# Patient Record
Sex: Male | Born: 1955
Health system: Southern US, Community
[De-identification: ages and names within clinical notes are randomized; demographics above are authoritative.]

## PROBLEM LIST (undated history)

## (undated) DIAGNOSIS — Z87442 Personal history of urinary calculi: Secondary | ICD-10-CM

## (undated) DIAGNOSIS — G629 Polyneuropathy, unspecified: Secondary | ICD-10-CM

## (undated) DIAGNOSIS — E1165 Type 2 diabetes mellitus with hyperglycemia: Secondary | ICD-10-CM

## (undated) DIAGNOSIS — Z950 Presence of cardiac pacemaker: Secondary | ICD-10-CM

## (undated) DIAGNOSIS — Z79631 Long term (current) use of antimetabolite agent: Secondary | ICD-10-CM

## (undated) DIAGNOSIS — IMO0002 Reserved for concepts with insufficient information to code with codable children: Secondary | ICD-10-CM

## (undated) DIAGNOSIS — Z79899 Other long term (current) drug therapy: Secondary | ICD-10-CM

## (undated) DIAGNOSIS — K219 Gastro-esophageal reflux disease without esophagitis: Secondary | ICD-10-CM

## (undated) DIAGNOSIS — I480 Paroxysmal atrial fibrillation: Secondary | ICD-10-CM

## (undated) DIAGNOSIS — R55 Syncope and collapse: Secondary | ICD-10-CM

## (undated) DIAGNOSIS — I10 Essential (primary) hypertension: Secondary | ICD-10-CM

## (undated) DIAGNOSIS — M069 Rheumatoid arthritis, unspecified: Secondary | ICD-10-CM

## (undated) DIAGNOSIS — Z7901 Long term (current) use of anticoagulants: Secondary | ICD-10-CM

## (undated) DIAGNOSIS — I4510 Unspecified right bundle-branch block: Secondary | ICD-10-CM

## (undated) DIAGNOSIS — I2510 Atherosclerotic heart disease of native coronary artery without angina pectoris: Secondary | ICD-10-CM

## (undated) DIAGNOSIS — E785 Hyperlipidemia, unspecified: Secondary | ICD-10-CM

## (undated) DIAGNOSIS — N200 Calculus of kidney: Secondary | ICD-10-CM

## (undated) DIAGNOSIS — I219 Acute myocardial infarction, unspecified: Secondary | ICD-10-CM

## (undated) DIAGNOSIS — N3281 Overactive bladder: Secondary | ICD-10-CM

## (undated) DIAGNOSIS — I442 Atrioventricular block, complete: Secondary | ICD-10-CM

## (undated) DIAGNOSIS — I700 Atherosclerosis of aorta: Secondary | ICD-10-CM

## (undated) HISTORY — PX: TONSILLECTOMY: SUR1361

## (undated) HISTORY — DX: Rheumatoid arthritis, unspecified: M06.9

## (undated) HISTORY — DX: Syncope and collapse: R55

## (undated) HISTORY — PX: PROSTATE SURGERY: SHX751

---

## 2005-07-07 ENCOUNTER — Ambulatory Visit: Payer: Self-pay | Admitting: Internal Medicine

## 2005-07-07 IMAGING — US US RENAL KIDNEY
1 series · 17 of 25 positions shown · non-contrast
Comparison: none

REASON FOR EXAM: Renal insufficiency
COMMENTS:

[Series 1: us renal kidney · 17 of 39 slices shown]
[im 1/39]
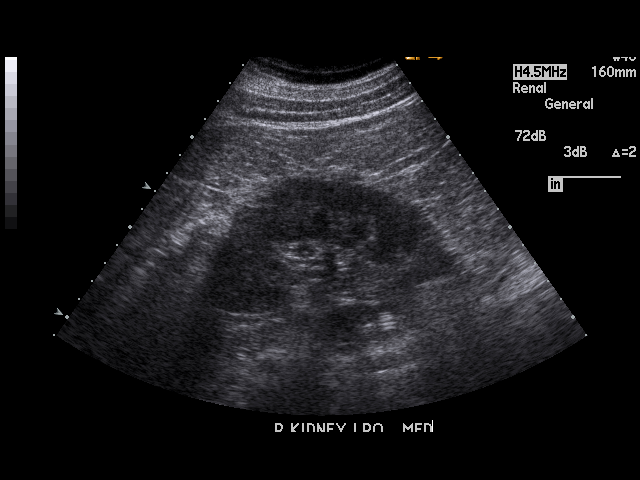
[im 4/39]
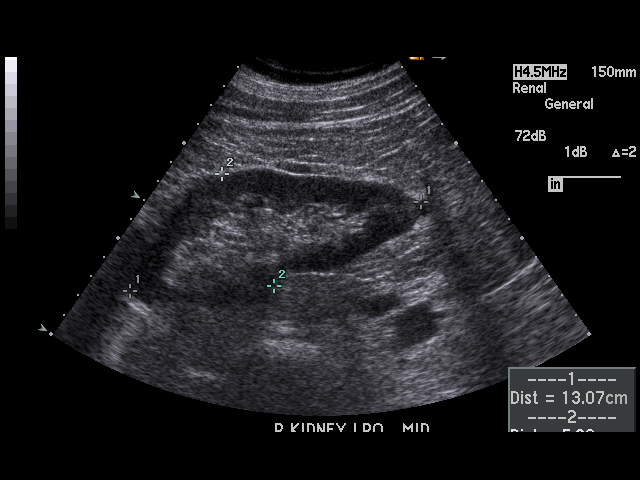
[im 5/39]
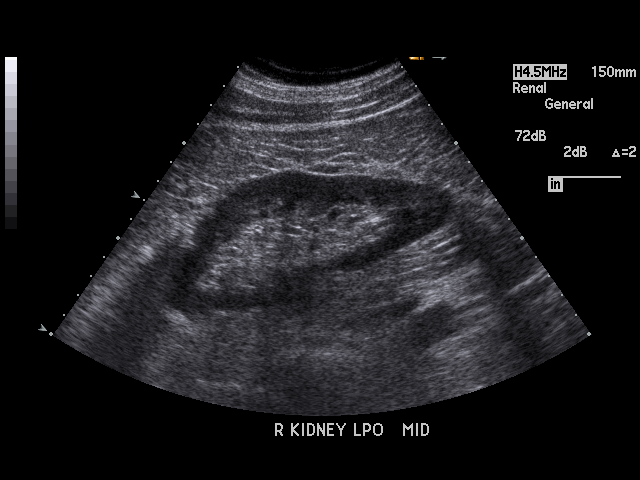
[im 8/39]
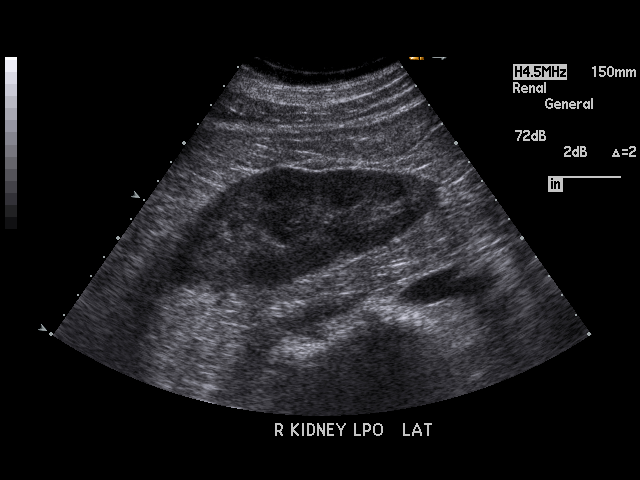
[im 10/39]
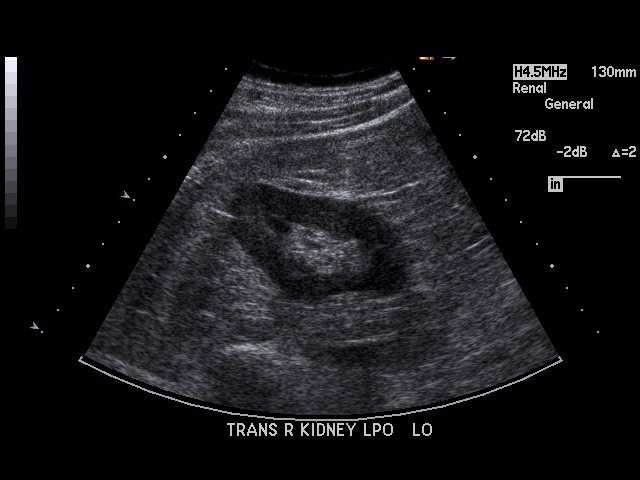
[im 13/39]
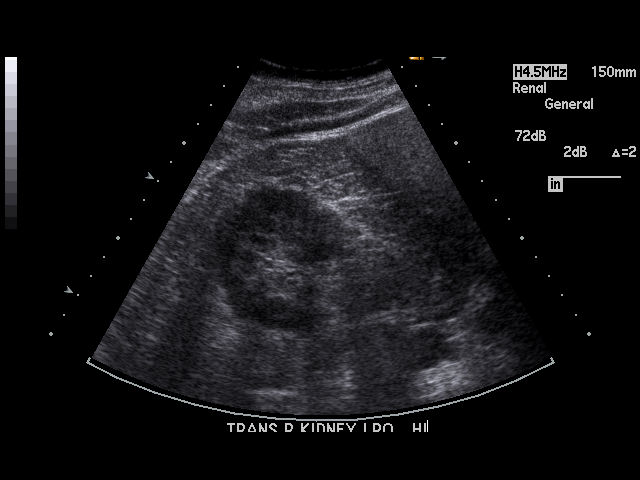
[im 15/39]
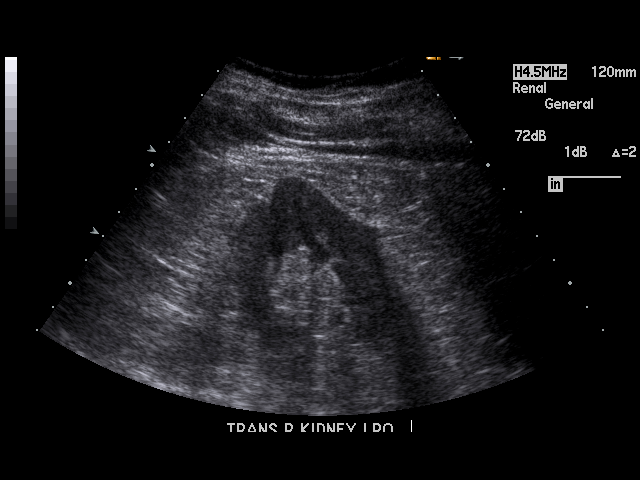
[im 18/39]
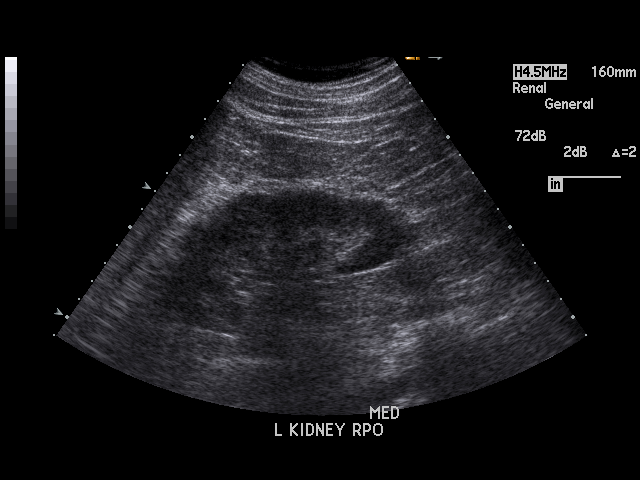
[im 20/39]
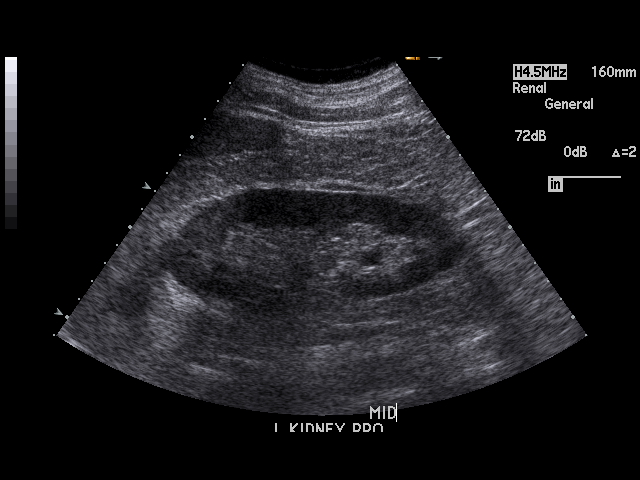
[im 21/39]
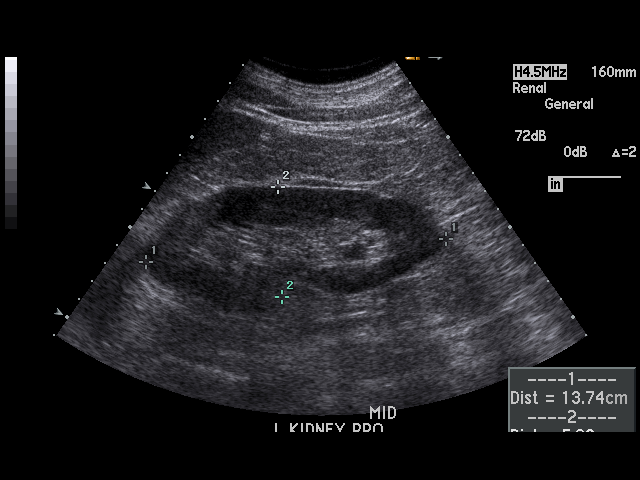
[im 24/39]
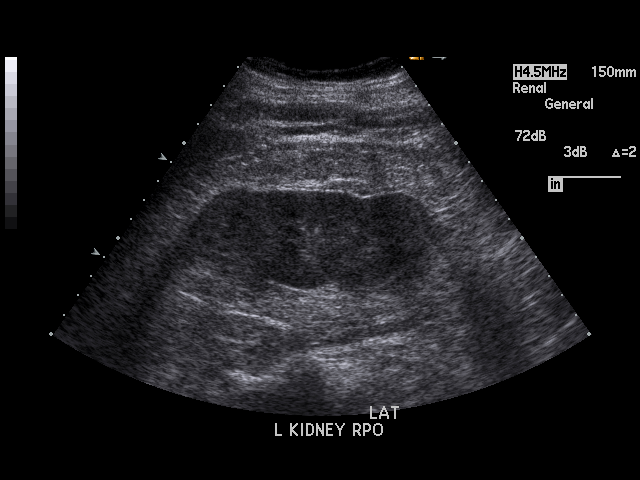
[im 26/39]
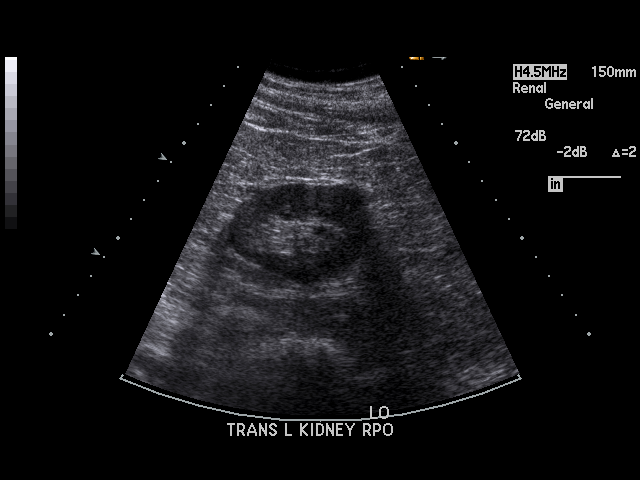
[im 29/39]
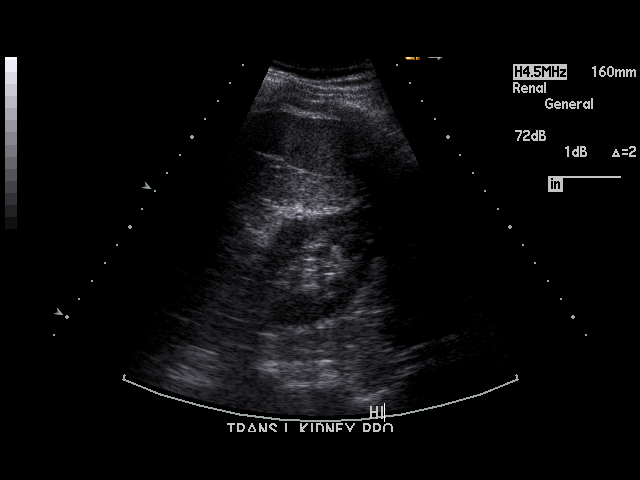
[im 31/39]
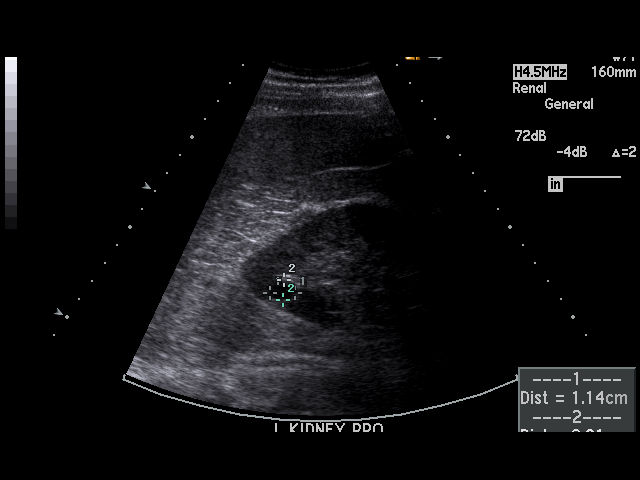
[im 34/39]
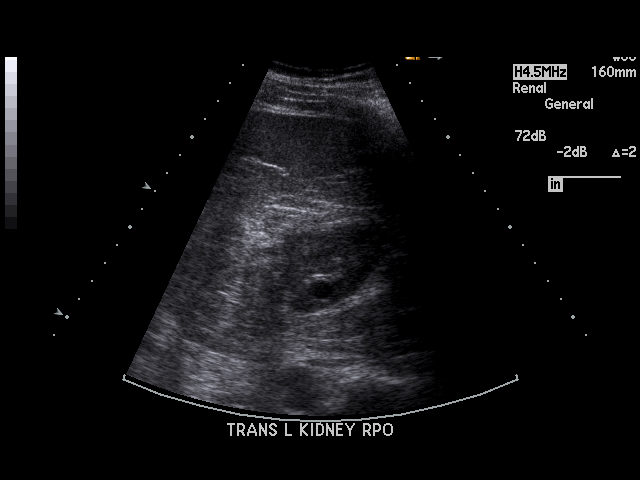
[im 35/39]
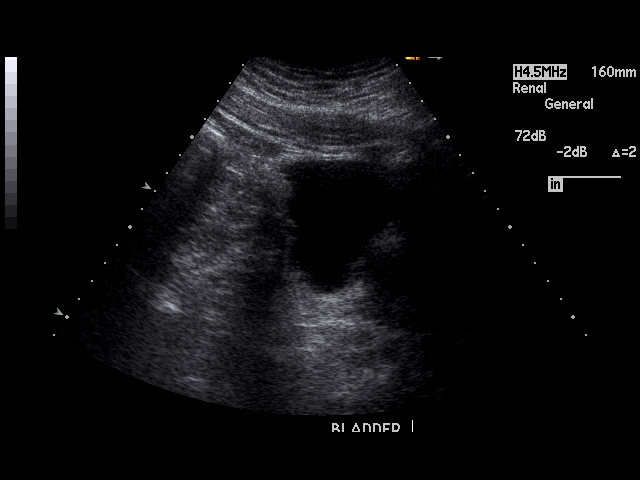
[im 39/39]
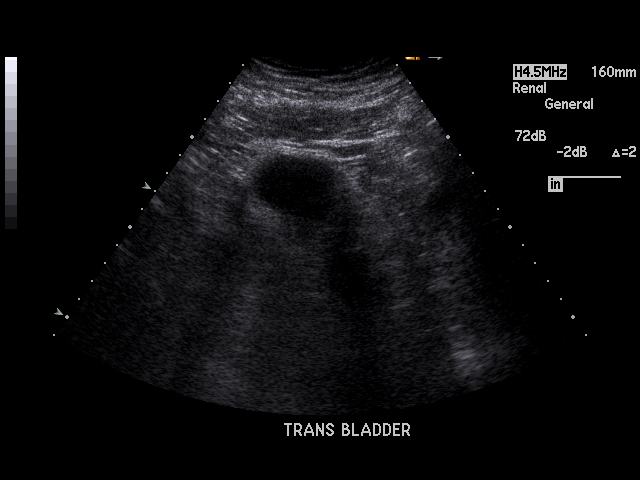

[17 of 25 positions shown; findings below may reference images not displayed]

PROCEDURE:     US  - US KIDNEY BILATERAL  - [DATE]  [DATE]

RESULT:     Real-time imaging was obtained.

The RIGHT kidney measures 13.1 x 5.3 x 5.6 cm. The LEFT kidney measures
x 5.0 x 6.3 cm. No hydronephrosis is seen. In the LEFT kidney in the upper
pole there is a tiny cyst measuring 1.1 x 0.91 cm. No additional masses,
solid or cystic, are noted.
IMPRESSION: There is a tiny cyst in the LEFT kidney. Otherwise, no
significant abnormalities are identified.

## 2009-01-11 ENCOUNTER — Ambulatory Visit: Payer: Self-pay | Admitting: Unknown Physician Specialty

## 2013-05-18 ENCOUNTER — Ambulatory Visit: Payer: Self-pay | Admitting: Surgery

## 2013-05-18 LAB — BASIC METABOLIC PANEL
BUN: 27 mg/dL — ABNORMAL HIGH (ref 7–18)
Chloride: 104 mmol/L (ref 98–107)
Creatinine: 1.03 mg/dL (ref 0.60–1.30)
EGFR (African American): >60
EGFR (Non-African Amer.): >60
Glucose: 142 mg/dL — ABNORMAL HIGH (ref 65–99)
Osmolality: 287 (ref 275–301)
Sodium: 140 mmol/L (ref 136–145)

## 2013-05-26 ENCOUNTER — Ambulatory Visit: Payer: Self-pay | Admitting: Surgery

## 2014-03-09 ENCOUNTER — Ambulatory Visit: Payer: Self-pay | Admitting: Unknown Physician Specialty

## 2014-03-12 LAB — PATHOLOGY REPORT

## 2014-07-29 DIAGNOSIS — E785 Hyperlipidemia, unspecified: Secondary | ICD-10-CM | POA: Insufficient documentation

## 2014-07-29 DIAGNOSIS — R32 Unspecified urinary incontinence: Secondary | ICD-10-CM | POA: Insufficient documentation

## 2014-07-29 DIAGNOSIS — E1169 Type 2 diabetes mellitus with other specified complication: Secondary | ICD-10-CM | POA: Insufficient documentation

## 2014-07-29 DIAGNOSIS — I129 Hypertensive chronic kidney disease with stage 1 through stage 4 chronic kidney disease, or unspecified chronic kidney disease: Secondary | ICD-10-CM | POA: Insufficient documentation

## 2014-07-29 DIAGNOSIS — N183 Chronic kidney disease, stage 3 unspecified: Secondary | ICD-10-CM | POA: Insufficient documentation

## 2015-04-12 NOTE — Op Note (Signed)
PATIENT NAME:  Patrick [name], Demetrius L MR#:  [id number] DATE OF BIRTH:  August 13, 1956  DATE OF PROCEDURE:  05/26/2013  PREOPERATIVE DIAGNOSIS: Anal mass.   POSTOPERATIVE DIAGNOSIS: Anal mass.   OPERATION: Excision of anal mass.   ANESTHESIA: General.   SURGEON: Carmie Endalph L. Ely III, MD   ASSISTANT: Leeanne Rio'Gara.  OPERATIVE PROCEDURE: With the patient in the supine position, after induction of appropriate general anesthesia, the patient was placed in the lithotomy position. The patient's perineum was prepped with Betadine and draped with sterile towels. The rectum was dilated with 2 fingers and a bivalve retractor inserted. In lithotomy position, the mass appeared to extend from the 3 o'clock position laterally, with large tag extending outside the rectum. It did not appear to be significantly neoplastic. The base of the mass was divided, mucosa only, using the Bovie electrocautery. The area was irrigated. There was no significant bleeding. The area was infiltrated with 0.25% Marcaine for postoperative pain control, and a packing of Gelfoam and Avitene was placed in the rectum. Sterile dressing was applied. The patient was returned to the recovery room, having tolerated the procedure well. Sponge, instrument and needle counts were correct x2 in the operating room.    ____________________________ Carmie Endalph L. Ely III, MD rle:OSi D: 05/26/2013 08:14:09 ET T: 05/26/2013 08:35:49 ET JOB#: [id number]  cc: Carmie Endalph L. Ely III, MD, <Dictator> Marya AmslerMarshall W. Dareen PianoAnderson, MD Quentin OreALPH L ELY MD ELECTRONICALLY SIGNED 05/31/2013 20:10

## 2016-11-08 ENCOUNTER — Emergency Department
Admission: EM | Admit: 2016-11-08 | Discharge: 2016-11-08 | Disposition: A | Discharge status: Home / Self Care | Payer: BLUE CROSS/BLUE SHIELD | Attending: Emergency Medicine | Admitting: Emergency Medicine

## 2016-11-08 ENCOUNTER — Emergency Department: Payer: BLUE CROSS/BLUE SHIELD

## 2016-11-08 ENCOUNTER — Encounter: Payer: Self-pay | Admitting: Emergency Medicine

## 2016-11-08 DIAGNOSIS — Z79899 Other long term (current) drug therapy: Secondary | ICD-10-CM | POA: Insufficient documentation

## 2016-11-08 DIAGNOSIS — E119 Type 2 diabetes mellitus without complications: Secondary | ICD-10-CM | POA: Diagnosis not present

## 2016-11-08 DIAGNOSIS — I10 Essential (primary) hypertension: Secondary | ICD-10-CM | POA: Insufficient documentation

## 2016-11-08 DIAGNOSIS — Z7984 Long term (current) use of oral hypoglycemic drugs: Secondary | ICD-10-CM | POA: Diagnosis not present

## 2016-11-08 DIAGNOSIS — R109 Unspecified abdominal pain: Secondary | ICD-10-CM | POA: Diagnosis not present

## 2016-11-08 DIAGNOSIS — N200 Calculus of kidney: Secondary | ICD-10-CM | POA: Insufficient documentation

## 2016-11-08 DIAGNOSIS — R339 Retention of urine, unspecified: Secondary | ICD-10-CM | POA: Diagnosis present

## 2016-11-08 HISTORY — DX: Overactive bladder: N32.81

## 2016-11-08 HISTORY — DX: Essential (primary) hypertension: I10

## 2016-11-08 HISTORY — DX: Calculus of kidney: N20.0

## 2016-11-08 HISTORY — DX: Gastro-esophageal reflux disease without esophagitis: K21.9

## 2016-11-08 LAB — CBC
HCT: 40.9 % (ref 40.0–52.0)
Hemoglobin: 14.3 g/dL (ref 13.0–18.0)
MCH: 30.9 pg (ref 26.0–34.0)
MCHC: 35.1 g/dL (ref 32.0–36.0)
MCV: 88 fL (ref 80.0–100.0)
Platelets: 205 10*3/uL (ref 150–440)
RBC: 4.64 MIL/uL (ref 4.40–5.90)
RDW: 13 % (ref 11.5–14.5)
WBC: 9.6 10*3/uL (ref 3.8–10.6)

## 2016-11-08 LAB — URINALYSIS COMPLETE WITH MICROSCOPIC (ARMC ONLY)
BACTERIA UA: NONE SEEN
Bilirubin Urine: NEGATIVE
Glucose, UA: NEGATIVE mg/dL
Nitrite: NEGATIVE
PH: 6 (ref 5.0–8.0)
PROTEIN: 30 mg/dL — AB
Specific Gravity, Urine: 1.024 (ref 1.005–1.030)

## 2016-11-08 LAB — BASIC METABOLIC PANEL
Anion gap: 9 (ref 5–15)
BUN: 41 mg/dL — ABNORMAL HIGH (ref 6–20)
CO2: 23 mmol/L (ref 22–32)
Calcium: 9 mg/dL (ref 8.9–10.3)
Chloride: 104 mmol/L (ref 101–111)
Creatinine, Ser: 1.51 mg/dL — ABNORMAL HIGH (ref 0.61–1.24)
GFR calc Af Amer: 55 mL/min — ABNORMAL LOW (ref >60)
GFR calc non Af Amer: 48 mL/min — ABNORMAL LOW (ref >60)
Glucose, Bld: 254 mg/dL — ABNORMAL HIGH (ref 65–99)
Potassium: 4.4 mmol/L (ref 3.5–5.1)
Sodium: 136 mmol/L (ref 135–145)

## 2016-11-08 LAB — HEPATIC FUNCTION PANEL
ALT: 30 U/L (ref 17–63)
AST: 31 U/L (ref 15–41)
Albumin: 4.2 g/dL (ref 3.5–5.0)
Alkaline Phosphatase: 27 U/L — ABNORMAL LOW (ref 38–126)
Bilirubin, Direct: 0.2 mg/dL (ref 0.1–0.5)
Indirect Bilirubin: 0.3 mg/dL (ref 0.3–0.9)
Total Bilirubin: 0.5 mg/dL (ref 0.3–1.2)
Total Protein: 6.6 g/dL (ref 6.5–8.1)

## 2016-11-08 LAB — LIPASE, BLOOD: LIPASE: 41 U/L (ref 11–51)

## 2016-11-08 IMAGING — CT CT RENAL STONE PROTOCOL
2 of 4 series · 16 of 46 positions shown, 18 images · non-contrast
Comparison: None.

CLINICAL DATA: Left lower back pain.

EXAM:
CT ABDOMEN AND PELVIS WITHOUT CONTRAST
TECHNIQUE: Multidetector CT imaging of the abdomen and pelvis was performed
following the standard protocol without IV contrast.

[Series 2: axial st · axial · 0.89mm/px · z∈[-548,-43]mm · 13 of 111 slices shown, 15 images]
[im 5/111  soft-tissue]
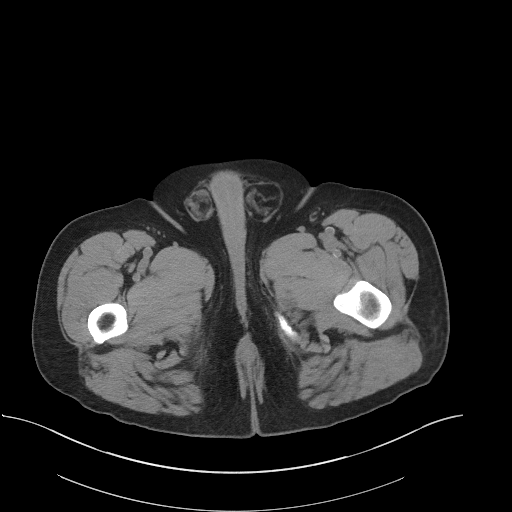
[im 5/111  bone]
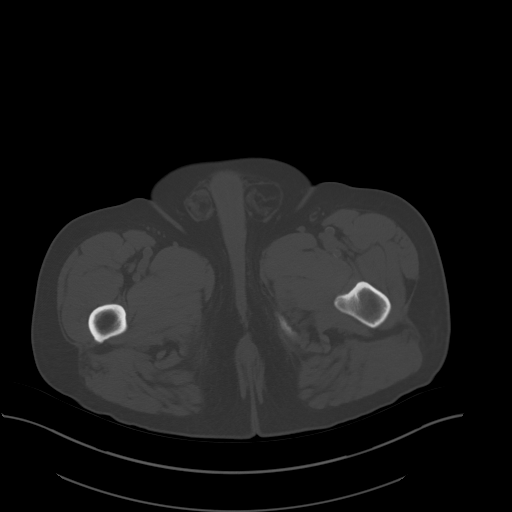
[im 14/111  soft-tissue]
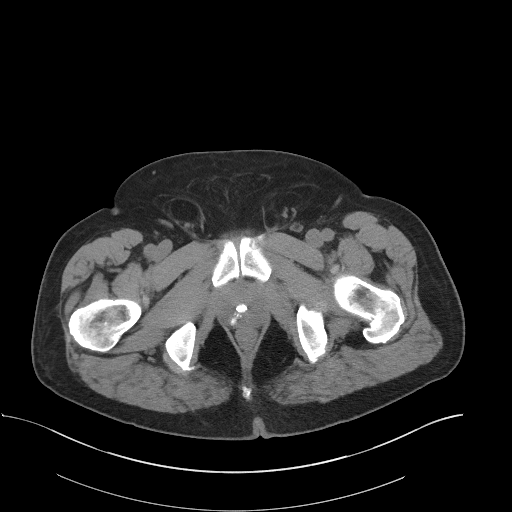
[im 23/111  soft-tissue]
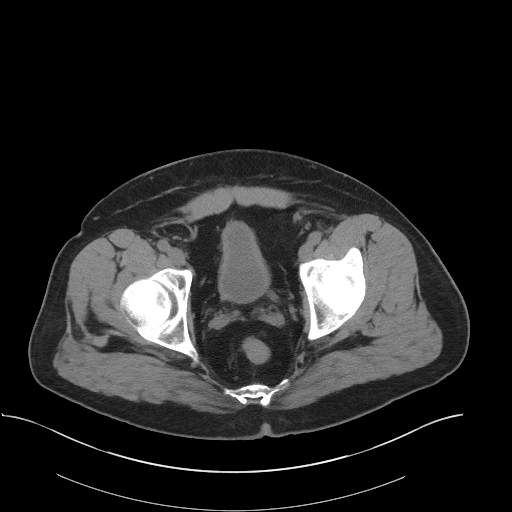
[im 33/111  soft-tissue]
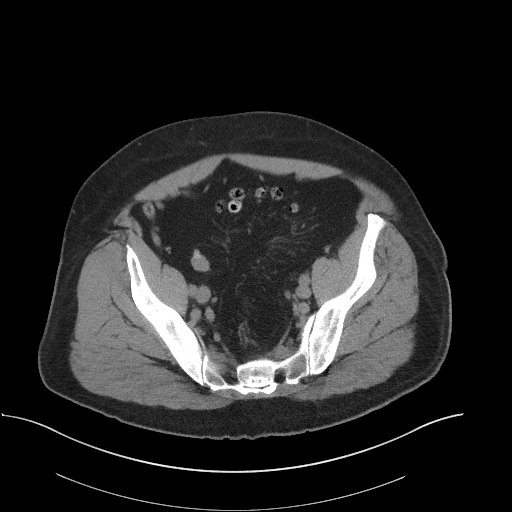
[im 37/111  soft-tissue]
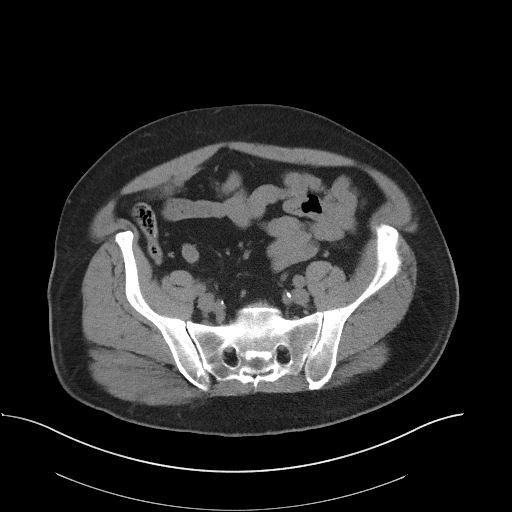
[im 46/111  soft-tissue]
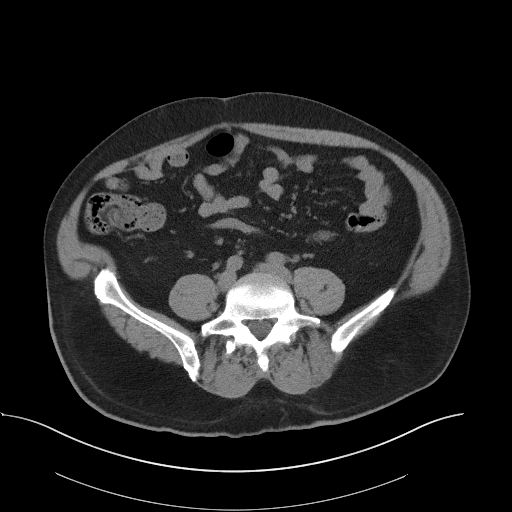
[im 56/111  soft-tissue]
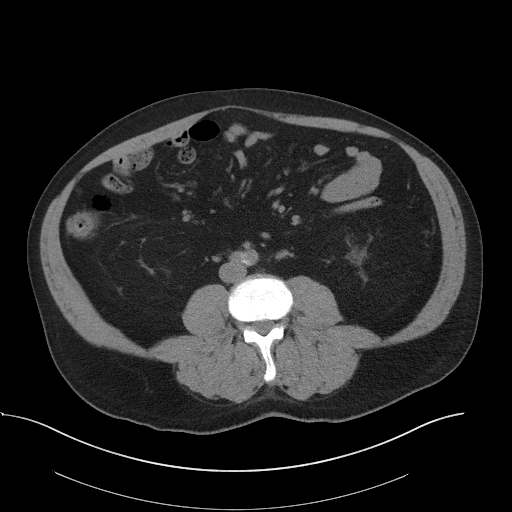
[im 65/111  soft-tissue]
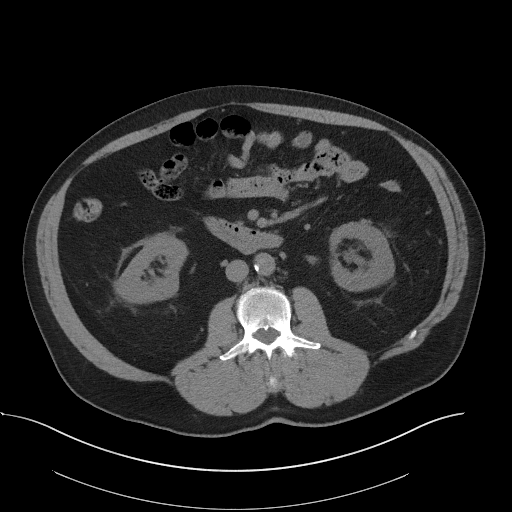
[im 74/111  soft-tissue]
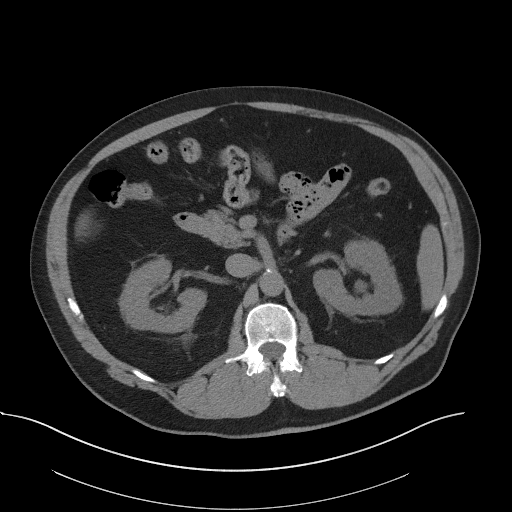
[im 74/111  bone]
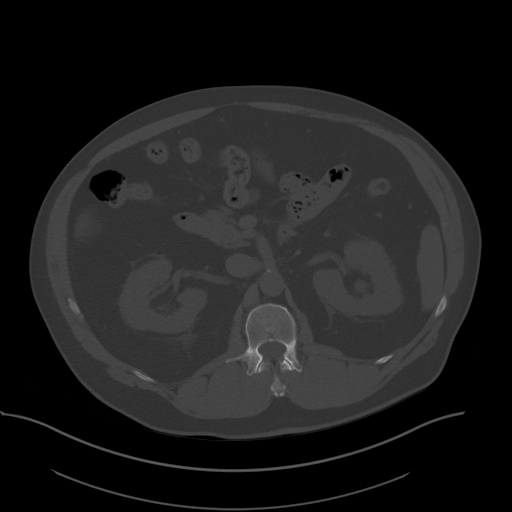
[im 78/111  soft-tissue]
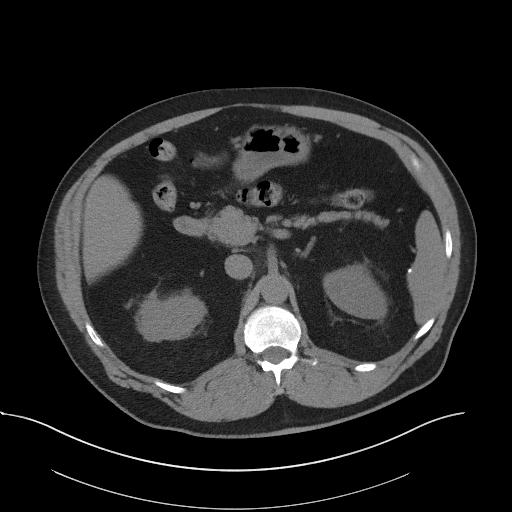
[im 88/111  soft-tissue]
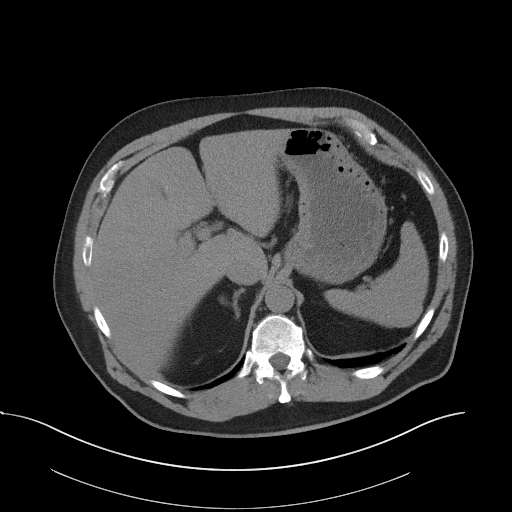
[im 97/111  soft-tissue]
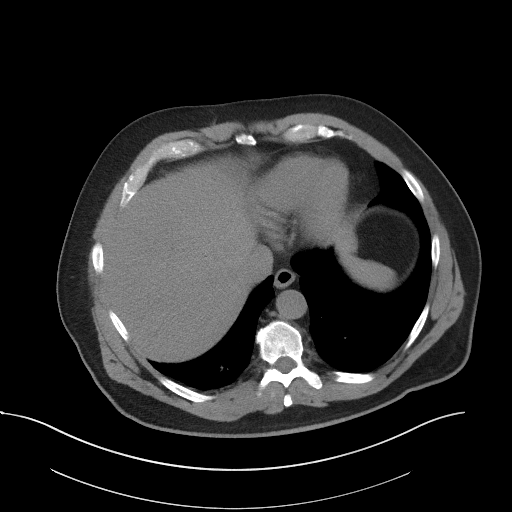
[im 106/111  soft-tissue]
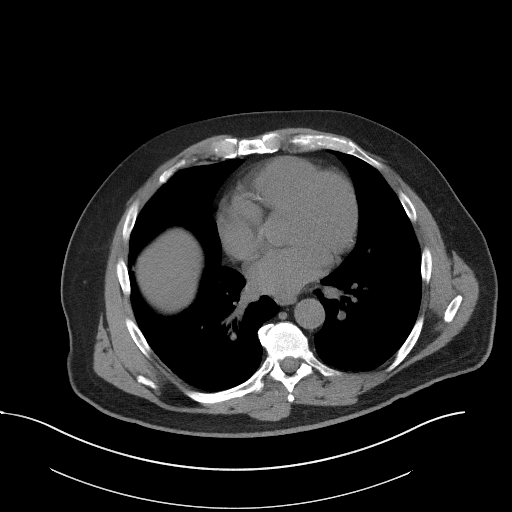

[Series 5: coronal · coronal · 0.89mm/px · 3 of 149 slices shown]
[im 50/149  soft-tissue]
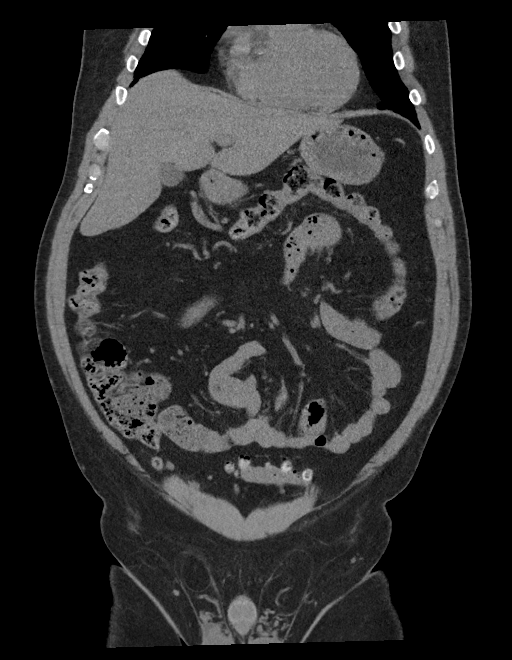
[im 66/149  soft-tissue]
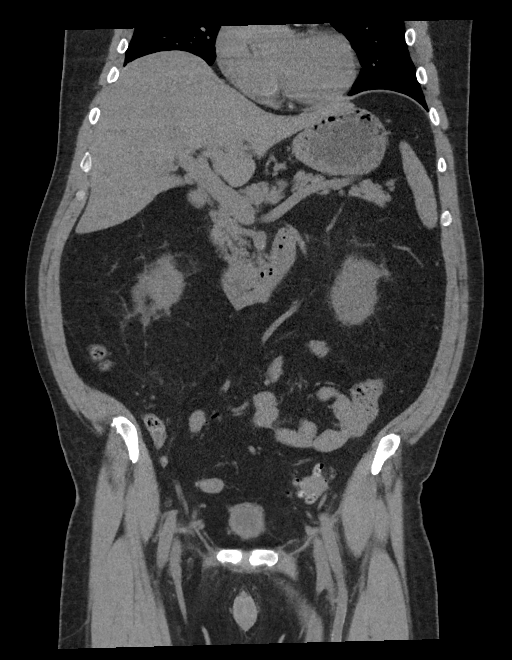
[im 83/149  soft-tissue]
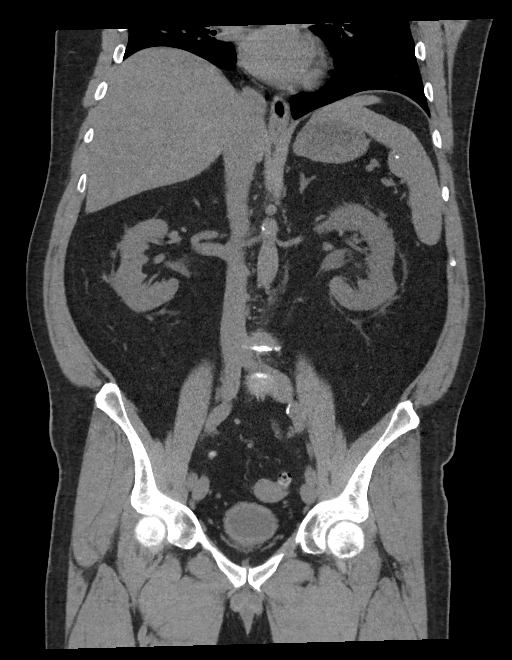

[16 of 46 positions shown; findings below may reference images not displayed]

FINDINGS: Lower chest: Calcified hilar and mediastinal nodes, incompletely
imaged, likely granulomatous.

Hepatobiliary: Calcified gallstones within the gallbladder lumen. No
significant liver lesions. No bile duct dilatation.

Pancreas: Unremarkable. No pancreatic ductal dilatation or
surrounding inflammatory changes.

Spleen: Normal in size without focal abnormality except for a few
calcified granulomata.

Adrenals/Urinary Tract: Both adrenals are normal.

There is hydronephrosis and hydroureter on the left, with 2 calculi
at the ureterovesical junction, each measuring 4 mm. No other
urinary calculi are evident. Right kidney, collecting system and
ureter appear normal.

Stomach/Bowel: Stomach is within normal limits. Appendix is normal.
There are scattered left hemicolon diverticula. The colon is
otherwise normal. No evidence of bowel wall thickening, distention,
or inflammatory changes.

Vascular/Lymphatic: Aortic atherosclerosis. No enlarged abdominal or
pelvic lymph nodes.

Reproductive: Prostatic calcifications.

Other: No ascites.  Small fat containing umbilical hernia.

Musculoskeletal: No acute or significant osseous findings.
IMPRESSION: 1. There are 2 calculi at the left ureterovesical junction, each
measuring 4 mm. There is moderate hydronephrosis and hydroureter. No
other urinary calculi. No other acute findings.
2. Cholelithiasis
3. Diverticulosis
4. Aortic atherosclerosis
5. Small fat containing umbilical hernia

## 2016-11-08 MED ORDER — ONDANSETRON 4 MG PO TBDP
ORAL_TABLET | ORAL | Status: AC
  Filled 2016-11-08: qty 1

## 2016-11-08 MED ORDER — ONDANSETRON 4 MG PO TBDP
4.0000 mg | ORAL_TABLET | Freq: Once | ORAL | Status: AC
  Administered 2016-11-08: 4 mg via ORAL

## 2016-11-08 MED ORDER — SODIUM CHLORIDE 0.9 % IV BOLUS (SEPSIS)
1000.0000 mL | Freq: Once | INTRAVENOUS | Status: AC
  Administered 2016-11-08: 1000 mL via INTRAVENOUS

## 2016-11-08 MED ORDER — OXYCODONE-ACETAMINOPHEN 5-325 MG PO TABS
1.0000 | ORAL_TABLET | Freq: Once | ORAL | Status: AC
Start: 2016-11-08 — End: 2016-11-08
  Administered 2016-11-08: 1 via ORAL

## 2016-11-08 MED ORDER — ONDANSETRON HCL 4 MG/2ML IJ SOLN
4.0000 mg | Freq: Once | INTRAMUSCULAR | Status: AC
  Administered 2016-11-08: 4 mg via INTRAVENOUS
  Filled 2016-11-08: qty 2

## 2016-11-08 MED ORDER — ONDANSETRON 4 MG PO TBDP: 4.0000 mg | ORAL_TABLET | Freq: Four times a day (QID) | ORAL | 0 refills | Status: DC | PRN

## 2016-11-08 MED ORDER — OXYCODONE-ACETAMINOPHEN 5-325 MG PO TABS: 1.0000 | ORAL_TABLET | Freq: Four times a day (QID) | ORAL | 0 refills | Status: DC | PRN

## 2016-11-08 MED ORDER — TAMSULOSIN HCL 0.4 MG PO CAPS: 0.4000 mg | ORAL_CAPSULE | Freq: Every day | ORAL | 0 refills | Status: DC

## 2016-11-08 MED ORDER — OXYCODONE-ACETAMINOPHEN 5-325 MG PO TABS
ORAL_TABLET | ORAL | Status: AC
  Filled 2016-11-08: qty 1

## 2016-11-08 MED ORDER — MORPHINE SULFATE (PF) 4 MG/ML IV SOLN
4.0000 mg | Freq: Once | INTRAVENOUS | Status: AC
  Administered 2016-11-08: 4 mg via INTRAVENOUS
  Filled 2016-11-08: qty 1

## 2016-11-08 NOTE — ED Provider Notes (Signed)
[facility] Emergency Department Provider Note   ____________________________________________   First MD Initiated Contact with Patient 11/08/16 0501     (approximate)  I have reviewed the triage vital signs and the nursing notes.   HISTORY  Chief Complaint Back Pain and Urinary Retention    HPI Patrick [name] L Mathey is a 60 y.o. male presents for evaluation of pain over the left back and abdomen. Patient also describes a feeling like he needs to urinate frequently. He has a history of overactive bladder, reported kidney stone about 7 years ago that seems somewhat similar.  No fevers or chills. Mild nausea but no vomiting. No shortness of breath or chest pain. No pain in the groin or testicles. He has not noticed any blood in the urine, denies any burning with urination just increased feeling of frequency and a moderate pain in the left back, now improved to about a 5 out of 10 after taking a Percocet tablet at triage. Reports he is comfortable at this time   Past Medical History:  Diagnosis Date  . Diabetes mellitus without complication (HCC)   . GERD (gastroesophageal reflux disease)   . Hypertension   . Overactive bladder   . Renal calculi     There are no active problems to display for this patient.   Past Surgical History:  Procedure Laterality Date  . PROSTATE SURGERY      Prior to Admission medications   Medication Sig Start Date End Date Taking? Authorizing Provider  felodipine (PLENDIL) 10 MG 24 hr tablet Take 10 mg by mouth daily.   Yes Historical Provider, MD  fenofibrate 160 MG tablet Take 160 mg by mouth daily.   Yes Historical Provider, MD  glimepiride (AMARYL) 2 MG tablet Take 2 mg by mouth daily with breakfast.   Yes Historical Provider, MD  losartan-hydrochlorothiazide (HYZAAR) 100-12.5 MG tablet Take 1 tablet by mouth daily.   Yes Historical Provider, MD  metformin (FORTAMET) 500 MG (OSM) 24 hr tablet Take 1,500 mg by mouth daily with  breakfast.   Yes Historical Provider, MD  pantoprazole (PROTONIX) 40 MG tablet Take 40 mg by mouth daily.   Yes Historical Provider, MD  tolterodine (DETROL LA) 2 MG 24 hr capsule Take 2 mg by mouth daily.   Yes Historical Provider, MD  ondansetron (ZOFRAN ODT) 4 MG disintegrating tablet Take 1 tablet (4 mg total) by mouth every 6 (six) hours as needed for nausea or vomiting. 11/08/16   Sharyn CreamerMark Dori Devino, MD  oxyCODONE-acetaminophen (ROXICET) 5-325 MG tablet Take 1-2 tablets by mouth every 6 (six) hours as needed for severe pain. 11/08/16   Sharyn CreamerMark Horton Ellithorpe, MD  tamsulosin (FLOMAX) 0.4 MG CAPS capsule Take 1 capsule (0.4 mg total) by mouth daily. 11/08/16   Sharyn CreamerMark Shamon Lobo, MD    Allergies Patient has no known allergies.  History reviewed. No pertinent family history.  Social History Social History  Substance Use Topics  . Smoking status: Never Smoker  . Smokeless tobacco: Never Used  . Alcohol use No    Review of Systems Constitutional: No fever/chills Eyes: No visual changes. ENT: No sore throat. Cardiovascular: Denies chest pain. Respiratory: Denies shortness of breath. Gastrointestinal: No vomiting.  No diarrhea.  No constipation. Genitourinary: Negative for dysuria. Musculoskeletal: Negative for back painExcept for some discomfort in the left lower back. Skin: Negative for rash. Neurological: Negative for headaches, focal weakness or numbness.  10-point ROS otherwise negative.  ____________________________________________   PHYSICAL EXAM:  VITAL SIGNS: ED Triage Vitals  Enc Vitals Group     BP 11/08/16 0546 (!) 157/91     Pulse Rate 11/08/16 0332 62     Resp 11/08/16 0332 16     Temp 11/08/16 0332 97.5 F (36.4 C)     Temp Source 11/08/16 0332 Oral     SpO2 11/08/16 0332 94 %     Weight 11/08/16 0333 220 lb (99.8 kg)     Height 11/08/16 0333 6\' 5"  (1.956 m)     Head Circumference --      Peak Flow --      Pain Score 11/08/16 0423 5     Pain Loc --      Pain Edu? --       Excl. in GC? --     Constitutional: Alert and oriented. Well appearing and in no acute distress.He and wife are both very pleasant Eyes: Conjunctivae are normal. PERRL. EOMI. Head: Atraumatic. Nose: No congestion/rhinnorhea. Mouth/Throat: Mucous membranes are moist.  Oropharynx non-erythematous. Neck: No stridor.   Cardiovascular: Normal rate, regular rhythm. Grossly normal heart sounds.  Good peripheral circulation. Respiratory: Normal respiratory effort.  No retractions. Lungs CTAB. Gastrointestinal: Soft and nontender for some mild tenderness in the left mid abdomen. No distention. No abdominal bruits. No CVA tenderness on the right, mild CVA tenderness on the left. Small soft, non-erythematous umbilical hernia patient reports he's been there for years without change or pain. Musculoskeletal: No lower extremity tenderness nor edema.  No joint effusions. Neurologic:  Normal speech and language. No gross focal neurologic deficits are appreciated.  Skin:  Skin is warm, dry and intact. No rash noted. Psychiatric: Mood and affect are normal. Speech and behavior are normal.  ____________________________________________   LABS (all labs ordered are listed, but only abnormal results are displayed)  Labs Reviewed  URINALYSIS COMPLETEWITH MICROSCOPIC ([facility] ONLY) - Abnormal; Notable for the following:       Result Value   Color, Urine YELLOW (*)    APPearance CLEAR (*)    Ketones, ur TRACE (*)    Hgb urine dipstick 2+ (*)    Protein, ur 30 (*)    Leukocytes, UA TRACE (*)    Squamous Epithelial / LPF 0-5 (*)    All other components within normal limits  BASIC METABOLIC PANEL - Abnormal; Notable for the following:    Glucose, Bld 254 (*)    BUN 41 (*)    Creatinine, Ser 1.51 (*)    GFR calc non Af Amer 48 (*)    GFR calc Af Amer 55 (*)    All other components within normal limits  HEPATIC FUNCTION PANEL - Abnormal; Notable for the following:    Alkaline Phosphatase 27 (*)    All  other components within normal limits  URINE CULTURE  CBC  LIPASE, BLOOD   ____________________________________________  EKG   ____________________________________________  RADIOLOGY  Ct Renal Stone Study  Result Date: 11/08/2016 CLINICAL DATA:  Left lower back pain. EXAM: CT ABDOMEN AND PELVIS WITHOUT CONTRAST TECHNIQUE: Multidetector CT imaging of the abdomen and pelvis was performed following the standard protocol without IV contrast. COMPARISON:  None. FINDINGS: Lower chest: Calcified hilar and mediastinal nodes, incompletely imaged, likely granulomatous. Hepatobiliary: Calcified gallstones within the gallbladder lumen. No significant liver lesions. No bile duct dilatation. Pancreas: Unremarkable. No pancreatic ductal dilatation or surrounding inflammatory changes. Spleen: Normal in size without focal abnormality except for a few calcified granulomata. Adrenals/Urinary Tract: Both adrenals are normal. There is hydronephrosis and hydroureter on the left, with  2 calculi at the ureterovesical junction, each measuring 4 mm. No other urinary calculi are evident. Right kidney, collecting system and ureter appear normal. Stomach/Bowel: Stomach is within normal limits. Appendix is normal. There are scattered left hemicolon diverticula. The colon is otherwise normal. No evidence of bowel wall thickening, distention, or inflammatory changes. Vascular/Lymphatic: Aortic atherosclerosis. No enlarged abdominal or pelvic lymph nodes. Reproductive: Prostatic calcifications. Other: No ascites.  Small fat containing umbilical hernia. Musculoskeletal: No acute or significant osseous findings. IMPRESSION: 1. There are 2 calculi at the left ureterovesical junction, each measuring 4 mm. There is moderate hydronephrosis and hydroureter. No other urinary calculi. No other acute findings. 2. Cholelithiasis 3. Diverticulosis 4. Aortic atherosclerosis 5. Small fat containing umbilical hernia Electronically Signed   By:  Ellery Plunkaniel R Mitchell M.D.   On: 11/08/2016 05:44    ____________________________________________   PROCEDURES  Procedure(s) performed: None  Procedures  Critical Care performed: No  ____________________________________________   INITIAL IMPRESSION / ASSESSMENT AND PLAN / ED COURSE  Pertinent labs & imaging results that were available during my care of the patient were reviewed by me and considered in my medical decision making (see chart for details).  Differential diagnosis includes but is not limited to, abdominal perforation, aortic dissection, cholecystitis, appendicitis, diverticulitis, colitis, esophagitis/gastritis, kidney stone, pyelonephritis, urinary tract infection, aortic aneurysm. All are considered in decision and treatment plan. Based upon the patient's presentation and risk factors, most suspect possible kidney stone given left flank pain with hematuria. Labs demonstrated a slightly elevated BUN and creatinine, he is able to control with Percocet. We'll proceed with CT without contrast to further evaluate. No systemic or infectious symptoms noted.  CT demonstrates 2 small left UVJ stones. Discussed with Dr. Vanna ScotlandAshley Brandon, she advises pain control, Flomax, close follow-up care and we reviewed his elevated BUN which she reports does not appear to be of concern at this time and can be seen frequently given the patient's associated hydronephrosis.  ----------------------------------------- 6:34 AM on 11/08/2016 -----------------------------------------  Patient resting comfortably. Agreeable with plan for discharge. I will prescribe the patient a narcotic pain medicine due to their condition which I anticipate will cause at least moderate pain short term. I discussed with the patient safe use of narcotic pain medicines, and that they are not to drive, work in dangerous areas, or ever take more than prescribed (no more than 1 pill every 6 hours). We discussed that this is the  type of medication that can be  overdosed on and the risks of this type of medicine. Patient is very agreeable to only use as prescribed and to never use more than prescribed.  Patient already has an appointment with Dr. Sheppard PentonWolf, of urology on Tuesday. He'll see him regarding follow-up on this as well. Pain well controlled. Wife is driving him home.  Return precautions and treatment recommendations and follow-up discussed with the patient who is agreeable with the plan.      Clinical Course      ____________________________________________   FINAL CLINICAL IMPRESSION(S) / ED DIAGNOSES  Final diagnoses:  Kidney stone on left side      NEW MEDICATIONS STARTED DURING THIS VISIT:  New Prescriptions   ONDANSETRON (ZOFRAN ODT) 4 MG DISINTEGRATING TABLET    Take 1 tablet (4 mg total) by mouth every 6 (six) hours as needed for nausea or vomiting.   OXYCODONE-ACETAMINOPHEN (ROXICET) 5-325 MG TABLET    Take 1-2 tablets by mouth every 6 (six) hours as needed for severe pain.   TAMSULOSIN (FLOMAX)  0.4 MG CAPS CAPSULE    Take 1 capsule (0.4 mg total) by mouth daily.     Note:  This document was prepared using Dragon voice recognition software and may include unintentional dictation errors.     Sharyn Creamer, MD 11/08/16 [phone]

## 2016-11-08 NOTE — ED Notes (Signed)
Pt bladder scanned, less than 13mL in bladder.

## 2016-11-08 NOTE — ED Triage Notes (Signed)
Pt says he feels like he has a kidney stone; c/o left lower back and has been unable to void since about 9pm last night; nausea with no vomiting; pt says he would like to try to void now; left wife at stat registration to complete process and he is going to restroom with collection cup;

## 2016-11-08 NOTE — ED Notes (Signed)
Pt able to void small amount; specimen to be sent to lab for analysis

## 2016-11-08 NOTE — ED Notes (Signed)
April, RN spoke with Dr Fanny BienQuale regarding pt's presenting symptoms and UA results. Dr. Fanny BienQuale declined to order kub or ct stone at this time.

## 2016-11-08 NOTE — ED Triage Notes (Signed)
Pt states he has not been able to "void any substantial amount" since 2200 last pm. Pt states he also has left flank pain and nausea. Pt appears in no acute distress in triage. Pt with history of renal calculi in past.

## 2016-11-08 NOTE — Discharge Instructions (Signed)
You have been seen in the Emergency Department (ED) today for pain that we believe based on your workup, is caused by kidney stones.  As we have discussed, please drink plenty of fluids.  Please make a follow up appointment with the physician(s) listed elsewhere in this documentation. ° °You may take pain medication as needed but ONLY as prescribed.  Please also take your prescribed Flomax daily.  We also recommend that you take over-the-counter ibuprofen regularly according to label instructions over the next 5 days.  Take it with meals to minimize stomach discomfort. ° °Please see your doctor as soon as possible as stones may take 1-3 weeks to pass and you may require additional care or medications. ° °Do not drink alcohol, drive or participate in any other potentially dangerous activities while taking opiate pain medication as it may make you sleepy. Do not take this medication with any other sedating medications, either prescription or over-the-counter. If you were prescribed Percocet or Vicodin, do not take these with acetaminophen (Tylenol) as it is already contained within these medications. °  °This medication is an opiate (or narcotic) pain medication and can be habit forming.  Use it as little as possible to achieve adequate pain control.  Do not use or use it with extreme caution if you have a history of opiate abuse or dependence.  If you are on a pain contract with your primary care doctor or a pain specialist, be sure to let them know you were prescribed this medication today from the [facility].  This medication is intended for your use only - do not give any to anyone else and keep it in a secure place where nobody else, especially children, have access to it.  It will also cause or worsen constipation, so you may want to consider taking an over-the-counter stool softener while you are taking this medication. ° °Return to the Emergency Department (ED) or call your doctor  if you have any worsening pain, fever, painful urination, are unable to urinate, or develop other symptoms that concern you. ° °

## 2016-11-09 LAB — URINE CULTURE
Culture: NO GROWTH
SPECIAL REQUESTS: NORMAL

## 2017-08-04 ENCOUNTER — Inpatient Hospital Stay (HOSPITAL_COMMUNITY)
Admission: EM | Admit: 2017-08-04 | Discharge: 2017-08-06 | DRG: 247 | Disposition: A | Discharge status: Home / Self Care | Payer: 59 | Attending: Cardiovascular Disease | Admitting: Cardiovascular Disease

## 2017-08-04 ENCOUNTER — Inpatient Hospital Stay (HOSPITAL_COMMUNITY)
Admission: EM | Disposition: A | Discharge status: Home / Self Care | Payer: Self-pay | Source: Home / Self Care | Attending: Cardiovascular Disease

## 2017-08-04 ENCOUNTER — Encounter (HOSPITAL_COMMUNITY): Payer: Self-pay | Admitting: Emergency Medicine

## 2017-08-04 ENCOUNTER — Other Ambulatory Visit: Payer: Self-pay

## 2017-08-04 ENCOUNTER — Emergency Department (HOSPITAL_COMMUNITY): Payer: 59

## 2017-08-04 DIAGNOSIS — Z8249 Family history of ischemic heart disease and other diseases of the circulatory system: Secondary | ICD-10-CM

## 2017-08-04 DIAGNOSIS — N3281 Overactive bladder: Secondary | ICD-10-CM | POA: Diagnosis present

## 2017-08-04 DIAGNOSIS — I2109 ST elevation (STEMI) myocardial infarction involving other coronary artery of anterior wall: Secondary | ICD-10-CM | POA: Diagnosis present

## 2017-08-04 DIAGNOSIS — E118 Type 2 diabetes mellitus with unspecified complications: Secondary | ICD-10-CM

## 2017-08-04 DIAGNOSIS — E782 Mixed hyperlipidemia: Secondary | ICD-10-CM

## 2017-08-04 DIAGNOSIS — I2102 ST elevation (STEMI) myocardial infarction involving left anterior descending coronary artery: Secondary | ICD-10-CM

## 2017-08-04 DIAGNOSIS — E785 Hyperlipidemia, unspecified: Secondary | ICD-10-CM

## 2017-08-04 DIAGNOSIS — I10 Essential (primary) hypertension: Secondary | ICD-10-CM

## 2017-08-04 DIAGNOSIS — I2510 Atherosclerotic heart disease of native coronary artery without angina pectoris: Secondary | ICD-10-CM | POA: Diagnosis present

## 2017-08-04 DIAGNOSIS — Z7984 Long term (current) use of oral hypoglycemic drugs: Secondary | ICD-10-CM

## 2017-08-04 DIAGNOSIS — K219 Gastro-esophageal reflux disease without esophagitis: Secondary | ICD-10-CM | POA: Diagnosis present

## 2017-08-04 DIAGNOSIS — Z79899 Other long term (current) drug therapy: Secondary | ICD-10-CM | POA: Diagnosis not present

## 2017-08-04 DIAGNOSIS — E876 Hypokalemia: Secondary | ICD-10-CM

## 2017-08-04 DIAGNOSIS — I213 ST elevation (STEMI) myocardial infarction of unspecified site: Secondary | ICD-10-CM

## 2017-08-04 DIAGNOSIS — E119 Type 2 diabetes mellitus without complications: Secondary | ICD-10-CM | POA: Diagnosis present

## 2017-08-04 DIAGNOSIS — I25118 Atherosclerotic heart disease of native coronary artery with other forms of angina pectoris: Secondary | ICD-10-CM

## 2017-08-04 DIAGNOSIS — Z955 Presence of coronary angioplasty implant and graft: Secondary | ICD-10-CM

## 2017-08-04 HISTORY — PX: LEFT HEART CATH AND CORONARY ANGIOGRAPHY: CATH118249

## 2017-08-04 HISTORY — DX: Atherosclerotic heart disease of native coronary artery without angina pectoris: I25.10

## 2017-08-04 HISTORY — PX: CORONARY/GRAFT ACUTE MI REVASCULARIZATION: CATH118305

## 2017-08-04 HISTORY — DX: Reserved for concepts with insufficient information to code with codable children: IMO0002

## 2017-08-04 HISTORY — DX: ST elevation (STEMI) myocardial infarction involving other coronary artery of anterior wall: I21.09

## 2017-08-04 HISTORY — DX: Type 2 diabetes mellitus with hyperglycemia: E11.65

## 2017-08-04 HISTORY — DX: Hyperlipidemia, unspecified: E78.5

## 2017-08-04 LAB — GLUCOSE, CAPILLARY
Glucose-Capillary: 341 mg/dL — ABNORMAL HIGH (ref 65–99)
Glucose-Capillary: 179 mg/dL — ABNORMAL HIGH (ref 65–99)
Glucose-Capillary: 309 mg/dL — ABNORMAL HIGH (ref 65–99)

## 2017-08-04 LAB — POCT I-STAT, CHEM 8
BUN: 28 mg/dL — AB (ref 6–20)
BUN: 29 mg/dL — AB (ref 6–20)
CHLORIDE: 98 mmol/L — AB (ref 101–111)
CHLORIDE: 99 mmol/L — AB (ref 101–111)
CREATININE: 1 mg/dL (ref 0.61–1.24)
CREATININE: 1.2 mg/dL (ref 0.61–1.24)
Calcium, Ion: 1.18 mmol/L (ref 1.15–1.40)
Calcium, Ion: 1.23 mmol/L (ref 1.15–1.40)
GLUCOSE: 377 mg/dL — AB (ref 65–99)
Glucose, Bld: 367 mg/dL — ABNORMAL HIGH (ref 65–99)
HEMATOCRIT: 36 % — AB (ref 39.0–52.0)
HEMATOCRIT: 37 % — AB (ref 39.0–52.0)
HEMOGLOBIN: 12.2 g/dL — AB (ref 13.0–17.0)
Hemoglobin: 12.6 g/dL — ABNORMAL LOW (ref 13.0–17.0)
POTASSIUM: 4.7 mmol/L (ref 3.5–5.1)
POTASSIUM: 4.7 mmol/L (ref 3.5–5.1)
SODIUM: 136 mmol/L (ref 135–145)
Sodium: 135 mmol/L (ref 135–145)
TCO2: 27 mmol/L (ref 0–100)
TCO2: 27 mmol/L (ref 0–100)

## 2017-08-04 LAB — MRSA PCR SCREENING: MRSA by PCR: NEGATIVE

## 2017-08-04 LAB — LIPID PANEL
CHOLESTEROL: 204 mg/dL — AB (ref 0–200)
HDL: 29 mg/dL — AB (ref >40)
LDL Cholesterol: 101 mg/dL — ABNORMAL HIGH (ref 0–99)
TRIGLYCERIDES: 370 mg/dL — AB (ref <150)
Total CHOL/HDL Ratio: 7 RATIO
VLDL: 74 mg/dL — ABNORMAL HIGH (ref 0–40)

## 2017-08-04 LAB — COMPREHENSIVE METABOLIC PANEL
ALBUMIN: 4.1 g/dL (ref 3.5–5.0)
ALT: 55 U/L (ref 17–63)
AST: 36 U/L (ref 15–41)
Alkaline Phosphatase: 35 U/L — ABNORMAL LOW (ref 38–126)
Anion gap: 12 (ref 5–15)
BILIRUBIN TOTAL: 0.7 mg/dL (ref 0.3–1.2)
BUN: 28 mg/dL — ABNORMAL HIGH (ref 6–20)
CO2: 23 mmol/L (ref 22–32)
Calcium: 9.1 mg/dL (ref 8.9–10.3)
Chloride: 100 mmol/L — ABNORMAL LOW (ref 101–111)
Creatinine, Ser: 1.23 mg/dL (ref 0.61–1.24)
GFR calc Af Amer: >60 mL/min (ref >60)
Glucose, Bld: 347 mg/dL — ABNORMAL HIGH (ref 65–99)
POTASSIUM: 4.3 mmol/L (ref 3.5–5.1)
Sodium: 135 mmol/L (ref 135–145)
Total Protein: 6.6 g/dL (ref 6.5–8.1)

## 2017-08-04 LAB — CBC
HCT: 38.1 % — ABNORMAL LOW (ref 39.0–52.0)
Hemoglobin: 13.7 g/dL (ref 13.0–17.0)
MCH: 30 pg (ref 26.0–34.0)
MCHC: 36 g/dL (ref 30.0–36.0)
MCV: 83.6 fL (ref 78.0–100.0)
Platelets: 238 10*3/uL (ref 150–400)
RBC: 4.56 MIL/uL (ref 4.22–5.81)
RDW: 12.2 % (ref 11.5–15.5)
WBC: 6.6 10*3/uL (ref 4.0–10.5)

## 2017-08-04 LAB — TROPONIN I: Troponin I: 10.17 ng/mL (ref <0.03)

## 2017-08-04 LAB — POCT I-STAT TROPONIN I: Troponin i, poc: 0.06 ng/mL (ref 0.00–0.08)

## 2017-08-04 LAB — APTT: aPTT: 25 seconds (ref 24–36)

## 2017-08-04 LAB — PROTIME-INR
INR: 0.98
PROTHROMBIN TIME: 13 s (ref 11.4–15.2)

## 2017-08-04 LAB — POCT ACTIVATED CLOTTING TIME: Activated Clotting Time: 571 seconds

## 2017-08-04 IMAGING — CR DG CHEST 2V
2 series · 2 of 2 positions shown · non-contrast
Comparison: CT abdomen pelvis dated [DATE].

CLINICAL DATA: Chest pain.

EXAM:
CHEST  2 VIEW

[w chest pa]
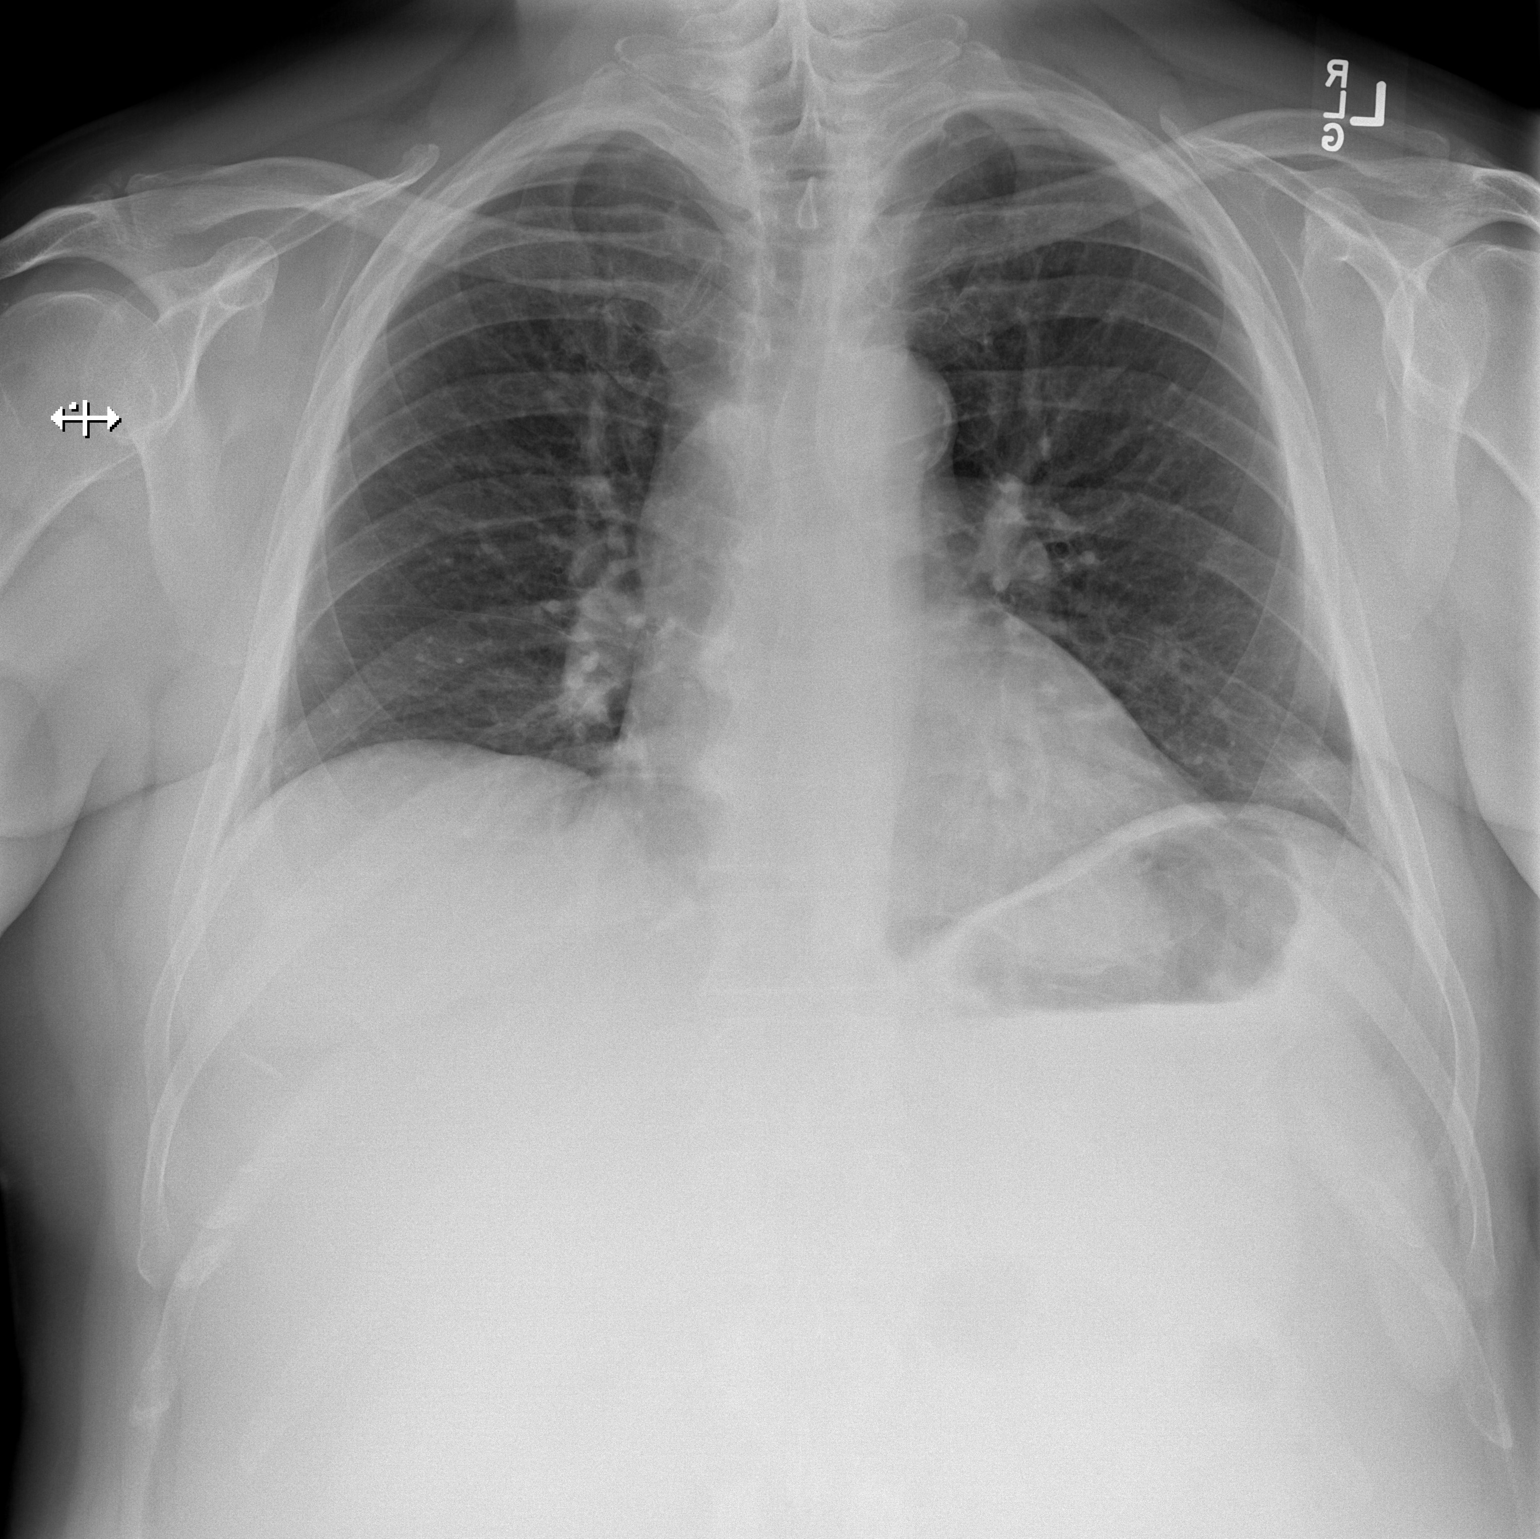

[w chest lat]
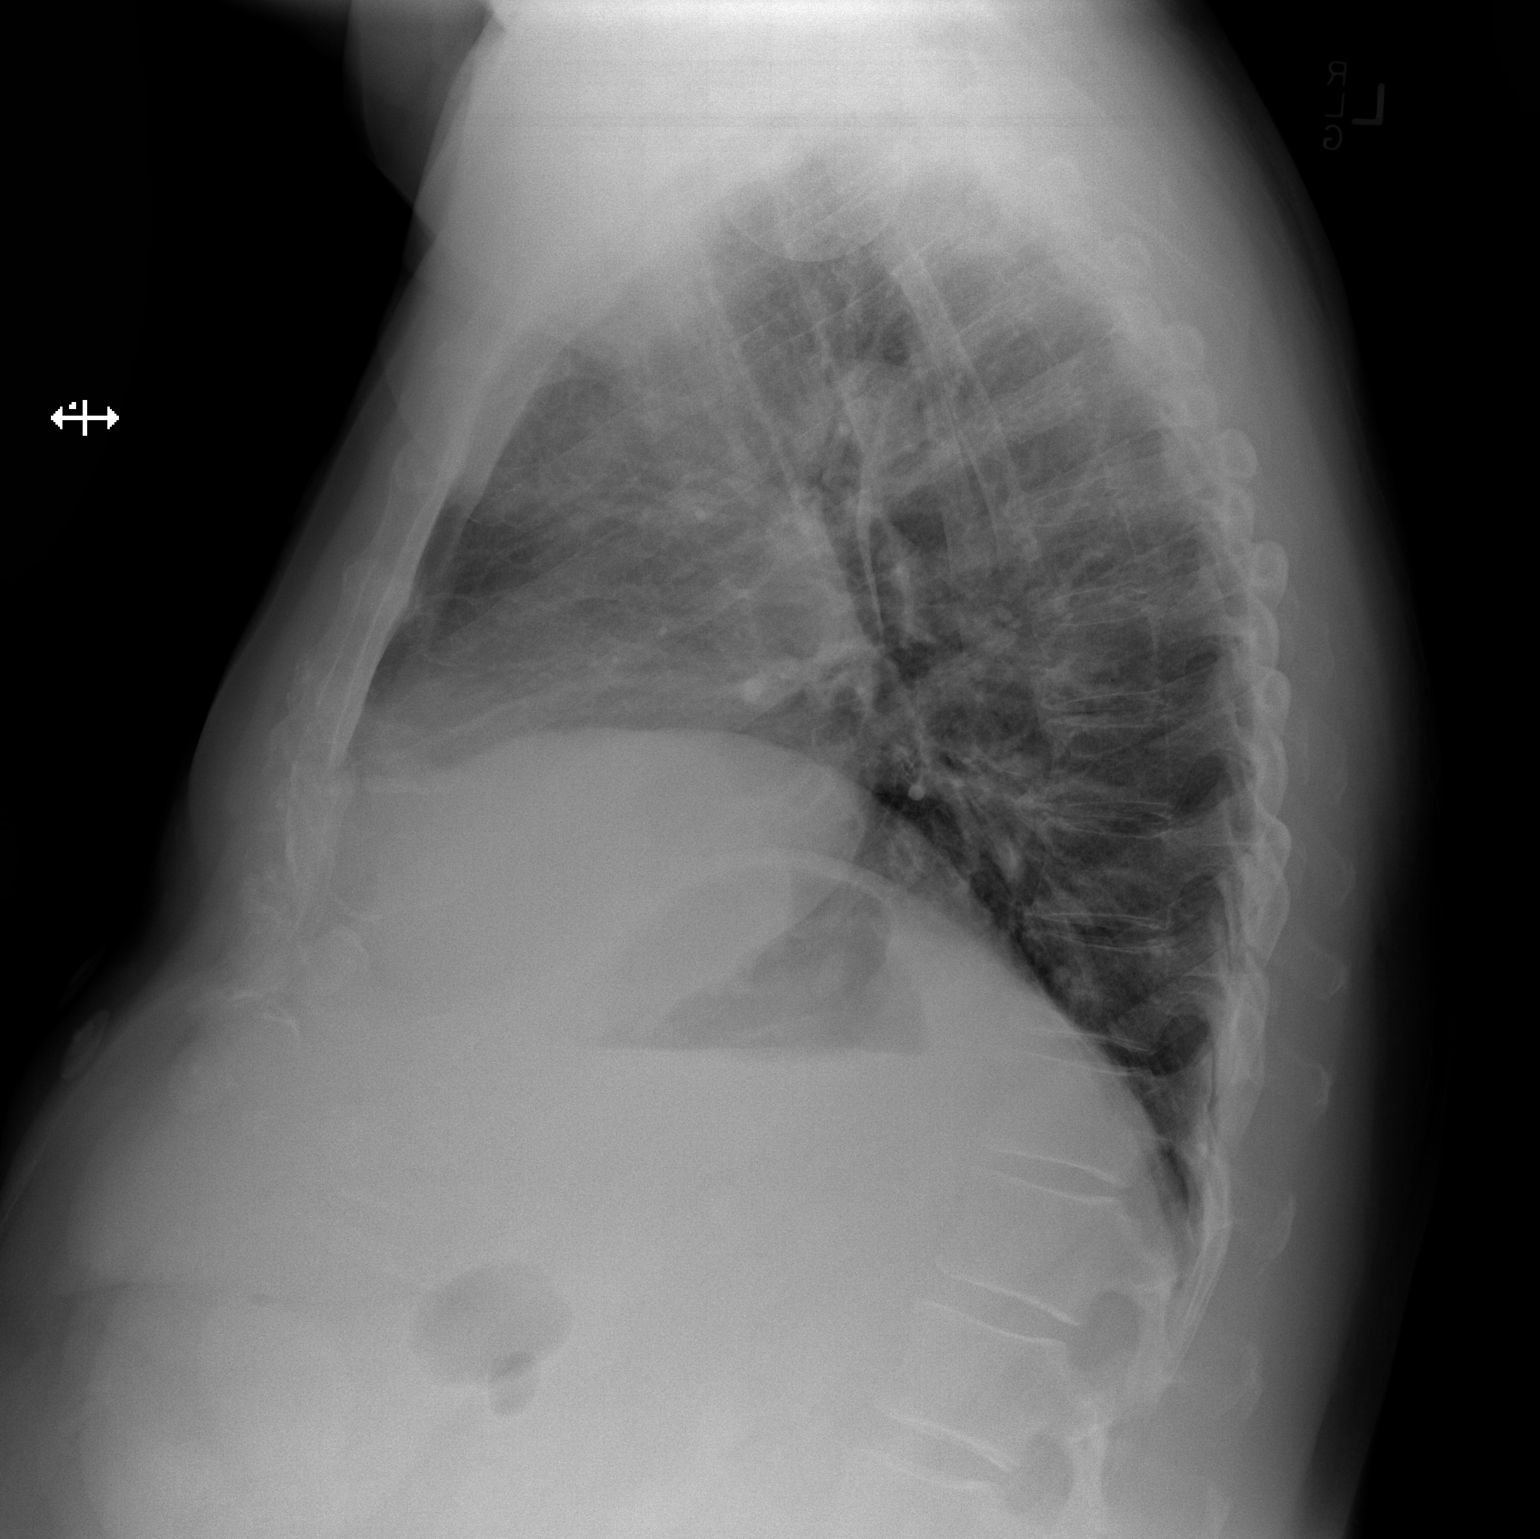

[2 of 2 positions shown; findings below may reference images not displayed]

FINDINGS: The cardiomediastinal silhouette is normal in size. Normal pulmonary
vascularity. Atherosclerotic calcification of the aortic arch. Low
lung volumes are present, causing crowding of the pulmonary
vasculature. No focal consolidation, pleural effusion, or
pneumothorax. Peripheral opacity in the lingula likely corresponds
to atelectasis/scarring seen on prior CT. No acute osseous
abnormality.
IMPRESSION: No active cardiopulmonary disease.

## 2017-08-04 SURGERY — LEFT HEART CATH AND CORONARY ANGIOGRAPHY
Anesthesia: LOCAL

## 2017-08-04 MED ORDER — METOPROLOL TARTRATE 12.5 MG HALF TABLET
12.5000 mg | ORAL_TABLET | Freq: Two times a day (BID) | ORAL | Status: DC
  Administered 2017-08-04: 12.5 mg via ORAL
  Filled 2017-08-04: qty 1

## 2017-08-04 MED ORDER — ASPIRIN 81 MG PO CHEW
CHEWABLE_TABLET | ORAL | Status: AC
  Filled 2017-08-04: qty 4

## 2017-08-04 MED ORDER — ATORVASTATIN CALCIUM 80 MG PO TABS
80.0000 mg | ORAL_TABLET | Freq: Every day | ORAL | Status: DC
  Administered 2017-08-04 – 2017-08-05 (×2): 80 mg via ORAL
  Filled 2017-08-04 (×2): qty 1

## 2017-08-04 MED ORDER — HYDRALAZINE HCL 20 MG/ML IJ SOLN
10.0000 mg | INTRAMUSCULAR | Status: DC | PRN
Start: 2017-08-04 — End: 2017-08-06
  Administered 2017-08-04 (×2): 10 mg via INTRAVENOUS
  Filled 2017-08-04 (×2): qty 1

## 2017-08-04 MED ORDER — IOPAMIDOL (ISOVUE-370) INJECTION 76%
INTRAVENOUS | Status: AC
  Filled 2017-08-04: qty 100

## 2017-08-04 MED ORDER — HEPARIN SODIUM (PORCINE) 5000 UNIT/ML IJ SOLN
60.0000 [IU]/kg | Freq: Once | INTRAMUSCULAR | Status: AC
  Administered 2017-08-04: 4000 [IU] via INTRAVENOUS

## 2017-08-04 MED ORDER — FENTANYL CITRATE (PF) 100 MCG/2ML IJ SOLN
INTRAMUSCULAR | Status: DC | PRN
  Administered 2017-08-04 (×2): 25 ug via INTRAVENOUS

## 2017-08-04 MED ORDER — NITROGLYCERIN 0.4 MG SL SUBL
SUBLINGUAL_TABLET | SUBLINGUAL | Status: AC
  Filled 2017-08-04: qty 1

## 2017-08-04 MED ORDER — NITROGLYCERIN 1 MG/10 ML FOR IR/CATH LAB
INTRA_ARTERIAL | Status: DC | PRN
  Administered 2017-08-04 (×3): 200 ug via INTRACORONARY

## 2017-08-04 MED ORDER — SODIUM CHLORIDE 0.9 % IV SOLN: 250.0000 mL | INTRAVENOUS | Status: DC | PRN

## 2017-08-04 MED ORDER — ASPIRIN 81 MG PO CHEW
324.0000 mg | CHEWABLE_TABLET | Freq: Once | ORAL | Status: AC
  Administered 2017-08-04: 324 mg via ORAL

## 2017-08-04 MED ORDER — BIVALIRUDIN TRIFLUOROACETATE 250 MG IV SOLR
INTRAVENOUS | Status: AC
  Filled 2017-08-04: qty 250

## 2017-08-04 MED ORDER — ASPIRIN 81 MG PO CHEW
81.0000 mg | CHEWABLE_TABLET | Freq: Every day | ORAL | Status: DC
  Administered 2017-08-05 – 2017-08-06 (×2): 81 mg via ORAL
  Filled 2017-08-04 (×2): qty 1

## 2017-08-04 MED ORDER — ATROPINE SULFATE 1 MG/10ML IJ SOSY
PREFILLED_SYRINGE | INTRAMUSCULAR | Status: AC
  Filled 2017-08-04: qty 10

## 2017-08-04 MED ORDER — SODIUM CHLORIDE 0.9 % IV SOLN
INTRAVENOUS | Status: AC | PRN
  Administered 2017-08-04 (×2): 1.75 mg/kg/h via INTRAVENOUS

## 2017-08-04 MED ORDER — HEPARIN (PORCINE) IN NACL 2-0.9 UNIT/ML-% IJ SOLN
INTRAMUSCULAR | Status: AC
  Filled 2017-08-04: qty 1000

## 2017-08-04 MED ORDER — TICAGRELOR 90 MG PO TABS
ORAL_TABLET | ORAL | Status: DC | PRN
  Administered 2017-08-04: 180 mg via ORAL

## 2017-08-04 MED ORDER — ZOLPIDEM TARTRATE 5 MG PO TABS: 5.0000 mg | ORAL_TABLET | Freq: Every evening | ORAL | Status: DC | PRN

## 2017-08-04 MED ORDER — SODIUM CHLORIDE 0.9% FLUSH: 3.0000 mL | INTRAVENOUS | Status: DC | PRN

## 2017-08-04 MED ORDER — IOPAMIDOL (ISOVUE-370) INJECTION 76%
INTRAVENOUS | Status: AC
  Filled 2017-08-04: qty 50

## 2017-08-04 MED ORDER — TICAGRELOR 90 MG PO TABS
90.0000 mg | ORAL_TABLET | Freq: Two times a day (BID) | ORAL | Status: DC
  Administered 2017-08-04 – 2017-08-06 (×4): 90 mg via ORAL
  Filled 2017-08-04 (×4): qty 1

## 2017-08-04 MED ORDER — ONDANSETRON HCL 4 MG/2ML IJ SOLN: 4.0000 mg | Freq: Four times a day (QID) | INTRAMUSCULAR | Status: DC | PRN

## 2017-08-04 MED ORDER — LIDOCAINE HCL (PF) 1 % IJ SOLN
INTRAMUSCULAR | Status: AC
  Filled 2017-08-04: qty 30

## 2017-08-04 MED ORDER — SODIUM CHLORIDE 0.9 % IV SOLN
INTRAVENOUS | Status: DC
  Administered 2017-08-04: 20 mL/h via INTRAVENOUS

## 2017-08-04 MED ORDER — METOPROLOL TARTRATE 12.5 MG HALF TABLET
12.5000 mg | ORAL_TABLET | Freq: Once | ORAL | Status: AC
  Administered 2017-08-04: 12.5 mg via ORAL
  Filled 2017-08-04: qty 1

## 2017-08-04 MED ORDER — SODIUM CHLORIDE 0.9% FLUSH
3.0000 mL | Freq: Two times a day (BID) | INTRAVENOUS | Status: DC
  Administered 2017-08-04 – 2017-08-06 (×4): 3 mL via INTRAVENOUS

## 2017-08-04 MED ORDER — TICAGRELOR 90 MG PO TABS
ORAL_TABLET | ORAL | Status: AC
  Filled 2017-08-04: qty 2

## 2017-08-04 MED ORDER — IOPAMIDOL (ISOVUE-370) INJECTION 76%
INTRAVENOUS | Status: AC
  Filled 2017-08-04: qty 125

## 2017-08-04 MED ORDER — MIDAZOLAM HCL 2 MG/2ML IJ SOLN
INTRAMUSCULAR | Status: AC
  Filled 2017-08-04: qty 2

## 2017-08-04 MED ORDER — NITROGLYCERIN 0.2 MG/ML ON CALL CATH LAB
INTRAVENOUS | Status: AC
  Filled 2017-08-04: qty 1

## 2017-08-04 MED ORDER — FENTANYL CITRATE (PF) 100 MCG/2ML IJ SOLN
INTRAMUSCULAR | Status: AC
  Filled 2017-08-04: qty 2

## 2017-08-04 MED ORDER — SODIUM CHLORIDE 0.9 % IV SOLN
INTRAVENOUS | Status: AC
  Administered 2017-08-04: 12:00:00 via INTRAVENOUS

## 2017-08-04 MED ORDER — MORPHINE SULFATE (PF) 2 MG/ML IV SOLN
INTRAVENOUS | Status: AC
  Filled 2017-08-04: qty 1

## 2017-08-04 MED ORDER — BIVALIRUDIN BOLUS VIA INFUSION - CUPID
INTRAVENOUS | Status: DC | PRN
  Administered 2017-08-04: 78.225 mg via INTRAVENOUS

## 2017-08-04 MED ORDER — MIDAZOLAM HCL 2 MG/2ML IJ SOLN
INTRAMUSCULAR | Status: DC | PRN
  Administered 2017-08-04: 2 mg via INTRAVENOUS

## 2017-08-04 MED ORDER — HEPARIN (PORCINE) IN NACL 2-0.9 UNIT/ML-% IJ SOLN
INTRAMUSCULAR | Status: AC | PRN
  Administered 2017-08-04: 1000 mL

## 2017-08-04 MED ORDER — NITROGLYCERIN 1 MG/10 ML FOR IR/CATH LAB
INTRA_ARTERIAL | Status: AC
  Filled 2017-08-04: qty 10

## 2017-08-04 MED ORDER — ACETAMINOPHEN 325 MG PO TABS
650.0000 mg | ORAL_TABLET | ORAL | Status: DC | PRN
  Administered 2017-08-04: 650 mg via ORAL
  Filled 2017-08-04: qty 2

## 2017-08-04 MED ORDER — METOPROLOL TARTRATE 5 MG/5ML IV SOLN
INTRAVENOUS | Status: AC
  Filled 2017-08-04: qty 5

## 2017-08-04 MED ORDER — METOPROLOL TARTRATE 25 MG PO TABS
25.0000 mg | ORAL_TABLET | Freq: Two times a day (BID) | ORAL | Status: DC
Start: 2017-08-04 — End: 2017-08-06
  Administered 2017-08-04 – 2017-08-06 (×4): 25 mg via ORAL
  Filled 2017-08-04 (×4): qty 1

## 2017-08-04 MED ORDER — NITROGLYCERIN 0.4 MG SL SUBL
0.4000 mg | SUBLINGUAL_TABLET | SUBLINGUAL | Status: DC | PRN
  Administered 2017-08-04: 0.4 mg via SUBLINGUAL

## 2017-08-04 MED ORDER — LIDOCAINE HCL (PF) 1 % IJ SOLN
INTRAMUSCULAR | Status: DC | PRN
  Administered 2017-08-04: 30 mL

## 2017-08-04 MED ORDER — INSULIN ASPART 100 UNIT/ML ~~LOC~~ SOLN
0.0000 [IU] | Freq: Three times a day (TID) | SUBCUTANEOUS | Status: DC
Start: 2017-08-04 — End: 2017-08-06
  Administered 2017-08-04: 3 [IU] via SUBCUTANEOUS
  Administered 2017-08-04: 11 [IU] via SUBCUTANEOUS
  Administered 2017-08-05: 8 [IU] via SUBCUTANEOUS
  Administered 2017-08-05: 5 [IU] via SUBCUTANEOUS
  Administered 2017-08-05: 8 [IU] via SUBCUTANEOUS
  Administered 2017-08-06: 11 [IU] via SUBCUTANEOUS
  Administered 2017-08-06: 5 [IU] via SUBCUTANEOUS

## 2017-08-04 SURGICAL SUPPLY — 21 items
BALLN SAPPHIRE 2.5X12 (BALLOONS) ×2
BALLN ~~LOC~~ EMERGE MR 3.25X30 (BALLOONS) ×2
BALLOON SAPPHIRE 2.5X12 (BALLOONS) ×1 IMPLANT
BALLOON ~~LOC~~ EMERGE MR 3.25X30 (BALLOONS) ×1 IMPLANT
CATH INFINITI 5 FR 3DRC (CATHETERS) ×2 IMPLANT
CATH INFINITI 5FR MULTPACK ANG (CATHETERS) ×2 IMPLANT
CATH VISTA GUIDE 6FR XBLAD3.5 (CATHETERS) ×2 IMPLANT
DEVICE CONTINUOUS FLUSH (MISCELLANEOUS) ×2 IMPLANT
ELECT DEFIB PAD ADLT CADENCE (PAD) ×2 IMPLANT
KIT ENCORE 26 ADVANTAGE (KITS) ×2 IMPLANT
KIT HEART LEFT (KITS) ×2 IMPLANT
PACK CARDIAC CATHETERIZATION (CUSTOM PROCEDURE TRAY) ×2 IMPLANT
SHEATH PINNACLE 5F 10CM (SHEATH) ×2 IMPLANT
SHEATH PINNACLE 6F 10CM (SHEATH) ×2 IMPLANT
SHEATH PINNACLE 7F 10CM (SHEATH) IMPLANT
STENT RESOLUTE ONYX3.0X38 (Permanent Stent) ×2 IMPLANT
SYR MEDRAD MARK V 150ML (SYRINGE) ×2 IMPLANT
TRANSDUCER W/STOPCOCK (MISCELLANEOUS) ×2 IMPLANT
TUBING CIL FLEX 10 FLL-RA (TUBING) ×2 IMPLANT
WIRE EMERALD 3MM-J .035X150CM (WIRE) ×2 IMPLANT
WIRE PT2 MS 185 (WIRE) ×2 IMPLANT

## 2017-08-04 NOTE — H&P (Signed)
History & Physical    Patient ID: Patrick [name] MRN: [id number], DOB/AGE: [birth year]   Admit date: 08/04/2017   Primary Physician: Lauro RegulusAnderson, Marshall W, MD Primary Cardiologist: Tresa EndoKelly  Patient Profile    61 yo male with PMH of HTN, GERD, NIDDM, HL and overactive bladder who [facility] with complaints of chest pain. Found to have an anterior STEMI and [facility].   Past Medical History   Past Medical History:  Diagnosis Date  . Diabetes mellitus without complication (HCC)   . GERD (gastroesophageal reflux disease)   . Hypertension   . Overactive bladder   . Renal calculi     Past Surgical History:  Procedure Laterality Date  . PROSTATE SURGERY       Allergies  No Known Allergies  History of Present Illness    Patrick [name] is a 61 yo male with PMH of HTN, GERD, NIDDM, HL and overactive bladder. He is currently followed by a PCP for his chronic illnesses. Reports having had a stress test that was normal about 5 years ago. Does not see a cardiologist. He has a family hx of CAD with MIs. [facility] with several hours of chest pressure that was substernal in nature and non radiating. EKG there showed anterolateral ST elevation. Labs were stable. Code STEMI was called and confirmed by Dr. Allyson SabalBerry. He was given 324 asa, and started on Iv heparin. [facility].   Home Medications    Prior to Admission medications   Medication Sig Start Date End Date Taking? Authorizing Provider  carvedilol (COREG) 6.25 MG tablet Take 6.25 mg by mouth 2 (two) times daily. 07/26/17  Yes [provider]  felodipine (PLENDIL) 10 MG 24 hr tablet Take 10 mg by mouth daily.   Yes [provider]  fenofibrate 160 MG tablet Take 160 mg by mouth daily.   Yes [provider]  glimepiride (AMARYL) 2 MG tablet Take 2 mg by mouth daily with breakfast.   Yes [provider]  Glucosamine-Chondroit-Vit C-Mn  (GLUCOSAMINE 1500 COMPLEX PO) Take 1,500 mg by mouth daily.   Yes [provider]  losartan-hydrochlorothiazide (HYZAAR) 100-12.5 MG tablet Take 1 tablet by mouth daily.   Yes [provider]  Magnesium 250 MG TABS Take 250 mg by mouth daily.   Yes [provider]  Magnesium Citrate 100 MG TABS Take 100 mg by mouth daily.   Yes [provider]  metformin (FORTAMET) 500 MG (OSM) 24 hr tablet Take 2,000 mg by mouth 2 (two) times daily with a meal.    Yes [provider]  Multiple Vitamins-Minerals (ABC PLUS SENIOR PO) Take 1 tablet by mouth daily.   Yes [provider]  MYRBETRIQ 25 MG TB24 tablet Take 25 mg by mouth at bedtime.  05/10/17  Yes [provider]  Omega-3 Fatty Acids (FISH OIL) 1200 MG CAPS Take 1,200 mg by mouth daily.   Yes [provider]  pantoprazole (PROTONIX) 40 MG tablet Take 40 mg by mouth daily.   Yes [provider]  Potassium 99 MG TABS Take 99 mg by mouth daily.   Yes [provider]  pyridOXINE (VITAMIN B-6) 50 MG tablet Take 50 mg by mouth daily.   Yes [provider]  saw palmetto 160 MG capsule Take 160 mg by mouth 2 (two) times daily.   Yes [provider]  vitamin C (ASCORBIC ACID) 500  MG tablet Take 500 mg by mouth daily.   Yes [provider]  vitamin E 400 UNIT capsule Take 400 Units by mouth daily.   Yes [provider]  ondansetron (ZOFRAN ODT) 4 MG disintegrating tablet Take 1 tablet (4 mg total) by mouth every 6 (six) hours as needed for nausea or vomiting. Patient not taking: Reported on 08/04/2017 11/08/16   Sharyn Creamer, MD  oxyCODONE-acetaminophen (ROXICET) 5-325 MG tablet Take 1-2 tablets by mouth every 6 (six) hours as needed for severe pain. Patient not taking: Reported on 08/04/2017 11/08/16   Sharyn Creamer, MD  tamsulosin (FLOMAX) 0.4 MG CAPS capsule Take 1 capsule (0.4 mg total) by mouth daily. Patient not taking: Reported on  08/04/2017 11/08/16   Sharyn Creamer, MD    Family History    Family History  Problem Relation Age of Onset  . Heart disease Father   . Heart attack Father     Social History    Social History   Social History  . Marital status: Married    Spouse name: N/A  . Number of children: N/A  . Years of education: N/A   Occupational History  . Not on file.   Social History Main Topics  . Smoking status: Never Smoker  . Smokeless tobacco: Never Used  . Alcohol use No  . Drug use: No  . Sexual activity: Not on file   Other Topics Concern  . Not on file   Social History Narrative  . No narrative on file     Review of Systems    General:  No chills, fever, night sweats or weight changes.  Cardiovascular:  No chest pain, dyspnea on exertion, edema, orthopnea, palpitations, paroxysmal nocturnal dyspnea. Dermatological: No rash, lesions/masses Respiratory: No cough, dyspnea Urologic: No hematuria, dysuria Abdominal:   No nausea, vomiting, diarrhea, bright red blood per rectum, melena, or hematemesis Neurologic:  No visual changes, wkns, changes in mental status. All other systems reviewed and are otherwise negative except as noted above.  Physical Exam    Blood pressure (!) 158/85, pulse 95, temperature 98.6 F (37 C), temperature source Oral, resp. rate 20, height 6' (1.829 m), weight 236 lb 5.3 oz (107.2 kg), SpO2 98 %.  General: Older W male Psych: Normal affect. Neuro: Alert and oriented X 3. Moves all extremities spontaneously. HEENT: Normal  Neck: Supple without bruits or JVD. Lungs:  Resp regular and unlabored, CTA. Heart: RRR no s3, s4, or murmurs. Abdomen: Soft, non-tender, non-distended, BS + x 4.  Extremities: No clubbing, cyanosis or edema. DP/PT/Radials 2+ and equal bilaterally.  Labs    Troponin [facility] of Care Test)  Recent Labs  08/04/17 1011  TROPIPOC 0.06    Recent Labs  08/04/17 1005  TROPONINI <0.03   Lab Results  Component Value Date    WBC 6.6 08/04/2017   HGB 13.7 08/04/2017   HCT 38.1 (L) 08/04/2017   MCV 83.6 08/04/2017   PLT 238 08/04/2017     Recent Labs Lab 08/04/17 1005  NA 135  K 4.3  CL 100*  CO2 23  BUN 28*  CREATININE 1.23  CALCIUM 9.1  PROT 6.6  BILITOT 0.7  ALKPHOS 35*  ALT 55  AST 36  GLUCOSE 347*   No results found for: CHOL, HDL, LDLCALC, TRIG No results found for: [facility]   Radiology Studies    Dg Chest 2 View  Result Date: 08/04/2017 CLINICAL DATA:  Chest pain. EXAM: CHEST  2 VIEW COMPARISON:  CT abdomen  pelvis dated November 08, 2016. FINDINGS: The cardiomediastinal silhouette is normal in size. Normal pulmonary vascularity. Atherosclerotic calcification of the aortic arch. Low lung volumes are present, causing crowding of the pulmonary vasculature. No focal consolidation, pleural effusion, or pneumothorax. Peripheral opacity in the lingula likely corresponds to atelectasis/scarring seen on prior CT. No acute osseous abnormality. IMPRESSION: No active cardiopulmonary disease. Electronically Signed   By: Obie Dredge M.D.   On: 08/04/2017 10:02    ECG & Cardiac Imaging    EKG: SR with acute anterolateral ST elevation  Assessment & Plan    61 yo male with PMH of HTN, GERD, NIDDM, HL and overactive bladder who presented to [facility] ED with complaints of chest pain. Found to have an anterior STEMI and transferred to [facility].  1. STEMI: Presented to [facility] ED with several hours of chest pain. EKG showed acute ST elevation in the anterolateral leads. He was given 324 ASA and started on IV heparin. Transferred to [facility] for cardiac cath.  -- further recommendations post cath, anticipate usual post MI care  2. HTN: start metoprolol 12.5mg  BID -- hold ARB given cath  3. NIDDM: hold metformin -- SSI  4. HL: start high dose statin  Janice Coffin, NP-C Pager [phone] 08/04/2017, 1:14 PM  Patient seen and examined. Agree with assessment and plan.Patrick [name] is a 61 year old Caucasian  gentleman who was a history of hypertension, type 2 diabetes mellitus, hyperlipidemia, GERD, who is been followed Dr. Dareen Piano at the [place] [facility].  Today while at work, he developed significant substernal chest pressure and tightness.  He presented to [facility].  His ECG showed ST elevation in the anterolateral leads.  A code STEMI was activated.  He was started on aspirin and received 4000 units of heparin.  [facility] for acute cardiac catheter[facility].  Upon arrival to the Cath Lab.  He was still having residual chest pain.  I discussed emergent cardiac catheterization with him.  Suspect a total LAD occlusion with his anterolateral ST elevation.  Patient is aware the risk, benefits of the procedure.    Lennette Bihari, MD, [facility] 08/04/2017 5:11 PM

## 2017-08-04 NOTE — ED Provider Notes (Signed)
[facility]-EMERGENCY DEPT Provider Note   CSN: [id number] Arrival date & time: 08/04/17  0940     History   Chief Complaint Chief Complaint  Patient presents with  . Chest Pain    HPI Patrick [name] is a 61 y.o. male.  HPI 61 year old gentleman with a history of hypertension and diabetes presents to the emergency department with 1-1/2 hours of pressure-like, substernal chest pain that is exertional, nonradiating. It is associated with mild shortness of breath and diaphoresis. Patient denies any nausea, vomiting, recent fevers, chills, infections, cough, abdominal pain. No alleviating factors.   Patient reports family history of heart attacks. Denies any prior similar episodes. Reports that he had a stress test approximately 5 years ago which was negative. Denies any prior heart catheterizations.  Past Medical History:  Diagnosis Date  . Diabetes mellitus without complication (HCC)   . GERD (gastroesophageal reflux disease)   . Hypertension   . Overactive bladder   . Renal calculi     There are no active problems to display for this patient.   Past Surgical History:  Procedure Laterality Date  . PROSTATE SURGERY         Home Medications    Prior to Admission medications   Medication Sig Start Date End Date Taking? Authorizing Provider  felodipine (PLENDIL) 10 MG 24 hr tablet Take 10 mg by mouth daily.    [provider]  fenofibrate 160 MG tablet Take 160 mg by mouth daily.    [provider]  glimepiride (AMARYL) 2 MG tablet Take 2 mg by mouth daily with breakfast.    [provider]  losartan-hydrochlorothiazide (HYZAAR) 100-12.5 MG tablet Take 1 tablet by mouth daily.    [provider]  metformin (FORTAMET) 500 MG (OSM) 24 hr tablet Take 1,500 mg by mouth daily with breakfast.    [provider]  ondansetron (ZOFRAN ODT) 4 MG disintegrating tablet Take 1 tablet (4 mg total) by mouth every 6 (six) hours as needed for nausea or  vomiting. 11/08/16   Sharyn CreamerQuale, Mark, MD  oxyCODONE-acetaminophen (ROXICET) 5-325 MG tablet Take 1-2 tablets by mouth every 6 (six) hours as needed for severe pain. 11/08/16   Sharyn CreamerQuale, Mark, MD  pantoprazole (PROTONIX) 40 MG tablet Take 40 mg by mouth daily.    [provider]  tamsulosin (FLOMAX) 0.4 MG CAPS capsule Take 1 capsule (0.4 mg total) by mouth daily. 11/08/16   Sharyn CreamerQuale, Mark, MD  tolterodine (DETROL LA) 2 MG 24 hr capsule Take 2 mg by mouth daily.    [provider]    Family History History reviewed. No pertinent family history.  Social History Social History  Substance Use Topics  . Smoking status: Never Smoker  . Smokeless tobacco: Never Used  . Alcohol use No     Allergies   Patient has no known allergies.   Review of Systems Review of Systems  All other systems are reviewed and are negative for acute change except as noted in the HPI  Physical Exam Updated Vital Signs BP 107/70 (BP Location: Right Arm)   Pulse (!) 50   Temp 97.7 F (36.5 C) (Oral)   Resp 16   SpO2 96%   Physical Exam  Constitutional: He is oriented to person, place, and time. He appears well-developed and well-nourished. No distress.  HENT:  Head: Normocephalic and atraumatic.  Nose: Nose normal.  Eyes: Pupils are equal, round, and reactive to light. Conjunctivae and EOM are normal. Right eye exhibits no discharge.  Left eye exhibits no discharge. No scleral icterus.  Neck: Normal range of motion. Neck supple.  Cardiovascular: Normal rate and regular rhythm.  Exam reveals no gallop and no friction rub.   No murmur heard. Pulmonary/Chest: Effort normal and breath sounds normal. No stridor. No respiratory distress. He has no rales.  Abdominal: Soft. He exhibits no distension. There is no tenderness.  Musculoskeletal: He exhibits no edema or tenderness.  Neurological: He is alert and oriented to person, place, and time.  Skin: Skin is warm. No rash noted. He is diaphoretic. No  erythema.  Psychiatric: He has a normal mood and affect.  Vitals reviewed.    ED Treatments / Results  Labs (all labs ordered are listed, but only abnormal results are displayed) Labs Reviewed  CBC - Abnormal; Notable for the following:       Result Value   HCT 38.1 (*)    All other components within normal limits  COMPREHENSIVE METABOLIC PANEL - Abnormal; Notable for the following:    Chloride 100 (*)    Glucose, Bld 347 (*)    BUN 28 (*)    Alkaline Phosphatase 35 (*)    All other components within normal limits  PROTIME-INR  APTT  TROPONIN I  LIPID PANEL  I-STAT TROPONIN, ED  POCT I-STAT TROPONIN I    EKG  EKG Interpretation  Date/Time:  Wednesday August 04 2017 09:47:36 EDT Ventricular Rate:  52 PR Interval:    QRS Duration: 149 QT Interval:  446 QTC Calculation: 415 R Axis:   66 Text Interpretation:  Sinus rhythm Right bundle branch block Lateral infarct, acute Baseline wander in lead(s) II III aVF ** ** ACUTE MI / STEMI ** ** Confirmed by Drema Pry [phone]) on 08/04/2017 11:18:17 AM       Radiology Dg Chest 2 View  Result Date: 08/04/2017 CLINICAL DATA:  Chest pain. EXAM: CHEST  2 VIEW COMPARISON:  CT abdomen pelvis dated November 08, 2016. FINDINGS: The cardiomediastinal silhouette is normal in size. Normal pulmonary vascularity. Atherosclerotic calcification of the aortic arch. Low lung volumes are present, causing crowding of the pulmonary vasculature. No focal consolidation, pleural effusion, or pneumothorax. Peripheral opacity in the lingula likely corresponds to atelectasis/scarring seen on prior CT. No acute osseous abnormality. IMPRESSION: No active cardiopulmonary disease. Electronically Signed   By: Obie Dredge M.D.   On: 08/04/2017 10:02    Procedures Procedures (including critical care time)  CRITICAL CARE Performed by: Amadeo Garnet Cardama Total critical care time: 30 minutes Critical care time was exclusive of separately billable  procedures and treating other patients. Critical care was necessary to treat or prevent imminent or life-threatening deterioration. Critical care was time spent personally by me on the following activities: development of treatment plan with patient and/or surrogate as well as nursing, discussions with consultants, evaluation of patient's response to treatment, examination of patient, obtaining history from patient or surrogate, ordering and performing treatments and interventions, ordering and review of laboratory studies, ordering and review of radiographic studies, pulse oximetry and re-evaluation of patient's condition.   Medications Ordered in ED Medications  0.9 %  sodium chloride infusion (20 mL/hr Intravenous New Bag/Given 08/04/17 1022)  nitroGLYCERIN (NITROSTAT) SL tablet 0.4 mg ( Sublingual MAR Hold 08/04/17 1035)  metoprolol tartrate (LOPRESSOR) 5 MG/5ML injection (not administered)  morphine 2 MG/ML injection (not administered)  nitroGLYCERIN (NITROSTAT) 0.4 MG SL tablet (not administered)  aspirin 81 MG chewable tablet (not administered)  fentaNYL (SUBLIMAZE) injection (25 mcg Intravenous Given 08/04/17 1106)  midazolam (VERSED) injection (2 mg Intravenous Given 08/04/17 1051)  bivalirudin (ANGIOMAX) BOLUS via infusion (78.225 mg Intravenous Given 08/04/17 1103)  ticagrelor (BRILINTA) tablet (180 mg Oral Given 08/04/17 1106)  lidocaine (PF) (XYLOCAINE) 1 % injection (30 mLs Infiltration Given 08/04/17 1053)  heparin infusion 2 units/mL in 0.9 % sodium chloride (1,000 mLs Other New Bag/Given 08/04/17 1108)  nitroGLYCERIN 1 mg/10 ml (100 mcg/ml) - IR/CATH LAB (200 mcg Intracoronary Given 08/04/17 1109)  bivalirudin (ANGIOMAX) 250 mg in sodium chloride 0.9 % 50 mL (5 mg/mL) infusion (1.75 mg/kg/hr  104.3 kg Intravenous New Bag/Given 08/04/17 1104)  aspirin chewable tablet 324 mg (324 mg Oral Given 08/04/17 1018)  heparin injection 60 Units/kg (4,000 Units Intravenous Given 08/04/17 1019)      Initial Impression / Assessment and Plan / ED Course  I have reviewed the triage vital signs and the nursing notes.  Pertinent labs & imaging results that were available during my care of the patient were reviewed by me and considered in my medical decision making (see chart for details).  Clinical Course as of Aug 04 1108  Wed Aug 04, 2017  1010 Presentation is concerning for STEMI. EKG did reveal ST segment elevation in anterior lateral leads. Code STEMI initiated. Confirm with Dr. Allyson Sabal from cardiology. Labs obtained and currently pending. Chest x-ray without evidence suggestive of pneumonia, pneumothorax, pneumomediastinum.  No abnormal contour of the mediastinum to suggest dissection. No evidence of acute injuries.  Patient provided with nitroglycerin, aspirin, heparin.  Will arrange transfer to [facility]  [PC]  1610 Transport here for transfer.  [PC]    Clinical Course User Index [PC] Cardama, Amadeo Garnet, MD      Final Clinical Impressions(s) / ED Diagnoses   Final diagnoses:  ST elevation myocardial infarction (STEMI), unspecified artery (HCC)      Cardama, Amadeo Garnet, MD 08/04/17 1121

## 2017-08-04 NOTE — ED Notes (Signed)
CODE STEMI activated by EDP Cardama. [facility] called @ 10:10am

## 2017-08-04 NOTE — Progress Notes (Signed)
CRITICAL VALUE ALERT  Critical Value:  Troponin 10.17  Date & Time Notied:  08/04/2017 1555  Provider Notified: Laverda PageLindsay Roberts NP  Orders Received/Actions taken: No new orders expected value. STEMI pt

## 2017-08-04 NOTE — ED Triage Notes (Signed)
Pt reports diaphoresis, lightheaded, dull squeezing central chest pain and pressure, nausea, SOB, onset today at 0845. No aggravating or alleviating factors.

## 2017-08-04 NOTE — Progress Notes (Signed)
Right groin Sheath removed at 1635. Pressure held for 20 mins. Pt tolerated well. Right Groin level 0. Instructed pt to hold pressure on right groin with coughing and sneezing. Pt demonstrated properly. Will continue to monitor.

## 2017-08-04 NOTE — Plan of Care (Signed)
Problem: Activity: Goal: Ability to return to baseline activity level will improve Outcome: Progressing Bedrest until shreath removd

## 2017-08-04 NOTE — ED Notes (Signed)
[facility]

## 2017-08-04 NOTE — ED Notes (Signed)
Paged Stemi DR on call per verbal from EDP Cardama.

## 2017-08-04 NOTE — Progress Notes (Signed)
Right groin is soft, no hematoma and no bleeding noted. Dorsalis pulses are strong. Patient remains in bedrest until 2100.  Wife and son at bedside. Denies chest pain or discomfort at present time.

## 2017-08-04 NOTE — ED Notes (Signed)
MED ness faxed

## 2017-08-05 DIAGNOSIS — I10 Essential (primary) hypertension: Secondary | ICD-10-CM

## 2017-08-05 LAB — GLUCOSE, CAPILLARY
GLUCOSE-CAPILLARY: 247 mg/dL — AB (ref 65–99)
GLUCOSE-CAPILLARY: 288 mg/dL — AB (ref 65–99)
Glucose-Capillary: 259 mg/dL — ABNORMAL HIGH (ref 65–99)
Glucose-Capillary: 304 mg/dL — ABNORMAL HIGH (ref 65–99)

## 2017-08-05 LAB — BASIC METABOLIC PANEL
Anion gap: 9 (ref 5–15)
BUN: 17 mg/dL (ref 6–20)
CALCIUM: 8.6 mg/dL — AB (ref 8.9–10.3)
CO2: 24 mmol/L (ref 22–32)
CREATININE: 1.16 mg/dL (ref 0.61–1.24)
Chloride: 105 mmol/L (ref 101–111)
GFR calc non Af Amer: >60 mL/min (ref >60)
Glucose, Bld: 174 mg/dL — ABNORMAL HIGH (ref 65–99)
Potassium: 3.4 mmol/L — ABNORMAL LOW (ref 3.5–5.1)
SODIUM: 138 mmol/L (ref 135–145)

## 2017-08-05 LAB — TROPONIN I
TROPONIN I: 15.87 ng/mL — AB (ref <0.03)
Troponin I: 20.22 ng/mL (ref <0.03)

## 2017-08-05 LAB — CBC
HCT: 37.7 % — ABNORMAL LOW (ref 39.0–52.0)
Hemoglobin: 13.5 g/dL (ref 13.0–17.0)
MCH: 30 pg (ref 26.0–34.0)
MCHC: 35.8 g/dL (ref 30.0–36.0)
MCV: 83.8 fL (ref 78.0–100.0)
Platelets: 218 10*3/uL (ref 150–400)
RBC: 4.5 MIL/uL (ref 4.22–5.81)
RDW: 12.4 % (ref 11.5–15.5)
WBC: 9.5 10*3/uL (ref 4.0–10.5)

## 2017-08-05 MED ORDER — INSULIN GLARGINE 100 UNIT/ML ~~LOC~~ SOLN
20.0000 [IU] | Freq: Every day | SUBCUTANEOUS | Status: DC
  Administered 2017-08-05: 20 [IU] via SUBCUTANEOUS
  Filled 2017-08-05: qty 0.2

## 2017-08-05 MED ORDER — LOSARTAN POTASSIUM 50 MG PO TABS
50.0000 mg | ORAL_TABLET | Freq: Every day | ORAL | Status: DC
  Administered 2017-08-05 – 2017-08-06 (×2): 50 mg via ORAL
  Filled 2017-08-05 (×2): qty 1

## 2017-08-05 MED FILL — Atropine Sulfate Soln Prefill Syr 1 MG/10ML (0.1 MG/ML): INTRAMUSCULAR | Qty: 10 | Status: AC

## 2017-08-05 NOTE — Care Management Note (Addendum)
Case Management Note  Patient Details  Name: Patrick [name] MRN: [id number] Date of Birth: 12-24-1955  Subjective/Objective:    From home presents with  STEMI treated with PCI and drug-eluting stenting of mid LAD, will be on brilinta, NCM awaiting benefit check, NCM gave patient $5 co pay card, he will be going to [facility] in [place] on [address], they do have in stock.               # 5  S/W CHERYL  @  [facility] # [id number]   BRILINTA  90 MG BID   COVER- YES  CO-PAY- ZERO DOLLARS  TIER- 4 DRUG  PRIOR APPROVAL- NO  DEDUCTIBLE -NO   PREFERRED PHARMACY : [facility], [facility]   MAIL-ORDER FOR 90 DAY SUPPLY ZERO DOLLARS  Action/Plan: NCM will follow for dc needs.   Expected Discharge Date:                  Expected Discharge Plan:     In-House Referral:     Discharge planning Services  CM Consult  Post Acute Care Choice:    Choice offered to:     DME Arranged:    DME Agency:     HH Arranged:    HH Agency:     Status of Service:  In process, will continue to follow  If discussed at Long Length of Stay Meetings, dates discussed:    Additional Comments:  Leone Havenaylor, Tayvin Preslar Clinton, RN 08/05/2017, 10:24 AM

## 2017-08-05 NOTE — Progress Notes (Addendum)
Inpatient Diabetes Program Recommendations  AACE/ADA: New Consensus Statement on Inpatient Glycemic Control (2015)  Target Ranges:  Prepandial:   less than 140 mg/dL      Peak postprandial:   less than 180 mg/dL (1-2 hours)      Critically ill patients:  140 - 180 mg/dL   Lab Results  Component Value Date   GLUCAP 247 (H) 08/05/2017    Review of Glycemic ControlResults for Patrick ShaggyKIRBY, Kieth L (MRN [id number]) as of 08/05/2017 11:51  Ref. Range 08/04/2017 12:34 08/04/2017 18:02 08/04/2017 21:41 08/05/2017 08:51  Glucose-Capillary Latest Ref Range: 65 - 99 mg/dL 409341 (H) 811179 (H) 914309 (H) 247 (H)   Diabetes history: Type 2 DM Outpatient Diabetes medications: Amaryl 2 mg daily Current orders for Inpatient glycemic control:  Novolog moderate tid with meals   Inpatient Diabetes Program Recommendations:   Please consider adding A1C to determine pre-hospitalization glycemic control.  Also please add Lantus 20 units daily while patient is in the hospital. Text page sent.   Thanks, Beryl MeagerJenny Pennye Beeghly, RN, BC-ADM Inpatient Diabetes Coordinator Pager [phone] (8a-5p)

## 2017-08-05 NOTE — Progress Notes (Signed)
#   5  S/W CHERYL  @  [facility] # [id number]   BRILINTA  90 MG BID   COVER- YES  CO-PAY- ZERO DOLLARS  TIER- 4 DRUG  PRIOR APPROVAL- NO  DEDUCTIBLE -NO   PREFERRED PHARMACY : [facility]   MAIL-ORDER FOR 90 DAY SUPPLY ZERO DOLLARS

## 2017-08-05 NOTE — Progress Notes (Signed)
Progress Note  Patient Name: Patrick [name] Date of Encounter: 08/05/2017  Primary Cardiologist: Tresa Endo  Subjective   No chest pain or shortness of breath this morning. Postop day #1 anterior STEMI treated with PCI and drug-eluting stenting of mid LAD.  Inpatient Medications    Scheduled Meds: . aspirin  81 mg Oral Daily  . atorvastatin  80 mg Oral q1800  . insulin aspart  0-15 Units Subcutaneous TID WC  . metoprolol tartrate  25 mg Oral BID  . sodium chloride flush  3 mL Intravenous Q12H  . ticagrelor  90 mg Oral BID   Continuous Infusions: . sodium chloride Stopped (08/05/17 0600)  . sodium chloride     PRN Meds: sodium chloride, acetaminophen, hydrALAZINE, nitroGLYCERIN, ondansetron (ZOFRAN) IV, sodium chloride flush, zolpidem   Vital Signs    Vitals:   08/05/17 0300 08/05/17 0400 08/05/17 0500 08/05/17 0700  BP: 137/84 139/87 126/75 (!) 133/98  Pulse: 76 78 81 84  Resp: 16 16 17 19   Temp: 99.4 F (37.4 C)     TempSrc: Oral     SpO2: 95% 94% 93% 95%  Weight:      Height:   6' (1.829 m)     Intake/Output Summary (Last 24 hours) at 08/05/17 0802 Last data filed at 08/05/17 0600  Gross per 24 hour  Intake          2595.67 ml  Output             3325 ml  Net          -729.33 ml   Filed Weights   08/04/17 1011 08/04/17 1214  Weight: 230 lb (104.3 kg) 236 lb 5.3 oz (107.2 kg)    Telemetry    Normal sinus rhythm - Personally Reviewed  ECG    Sinus rhythm at 78 with right bundle-branch block, inferior and anterior Q waves. - Personally Reviewed  Physical Exam   GEN: No acute distress.   Neck: No JVD Cardiac: RRR, no murmurs, rubs, or gallops.  Respiratory: Clear to auscultation bilaterally. GI: Soft, nontender, non-distended  MS: No edema; No deformity. Neuro:  Nonfocal  Psych: Normal affect  Extremity-right femoral artery puncture site looks well-healed   Labs    Chemistry Recent Labs Lab 08/04/17 1005 08/04/17 1050 08/04/17 1054  08/05/17 0507  NA 135 135 136 138  K 4.3 4.7 4.7 3.4*  CL 100* 99* 98* 105  CO2 23  --   --  24  GLUCOSE 347* 377* 367* 174*  BUN 28* 29* 28* 17  CREATININE 1.23 1.00 1.20 1.16  CALCIUM 9.1  --   --  8.6*  PROT 6.6  --   --   --   ALBUMIN 4.1  --   --   --   AST 36  --   --   --   ALT 55  --   --   --   ALKPHOS 35*  --   --   --   BILITOT 0.7  --   --   --   GFRNONAA >60  --   --  >60  GFRAA >60  --   --  >60  ANIONGAP 12  --   --  9     Hematology Recent Labs Lab 08/04/17 0948 08/04/17 1050 08/04/17 1054 08/05/17 0507  WBC 6.6  --   --  9.5  RBC 4.56  --   --  4.50  HGB 13.7 12.2* 12.6* 13.5  HCT 38.1* 36.0* 37.0* 37.7*  MCV 83.6  --   --  83.8  MCH 30.0  --   --  30.0  MCHC 36.0  --   --  35.8  RDW 12.2  --   --  12.4  PLT 238  --   --  218    Cardiac Enzymes Recent Labs Lab 08/04/17 1005 08/04/17 1500 08/04/17 2307 08/05/17 0507  TROPONINI <0.03 10.17* 20.22* 15.87*    Recent Labs Lab 08/04/17 1011  TROPIPOC 0.06     BNPNo results for input(s): BNP, PROBNP in the last 168 hours.   DDimer No results for input(s): DDIMER in the last 168 hours.   Radiology    Dg Chest 2 View  Result Date: 08/04/2017 CLINICAL DATA:  Chest pain. EXAM: CHEST  2 VIEW COMPARISON:  CT abdomen pelvis dated November 08, 2016. FINDINGS: The cardiomediastinal silhouette is normal in size. Normal pulmonary vascularity. Atherosclerotic calcification of the aortic arch. Low lung volumes are present, causing crowding of the pulmonary vasculature. No focal consolidation, pleural effusion, or pneumothorax. Peripheral opacity in the lingula likely corresponds to atelectasis/scarring seen on prior CT. No acute osseous abnormality. IMPRESSION: No active cardiopulmonary disease. Electronically Signed   By: Obie DredgeWilliam T Derry M.D.   On: 08/04/2017 10:02    Cardiac Studies   Cardiac catheterization/PCI and drug-eluting stenting (08/04/17).  Conclusion     LV end diastolic pressure is  mildly elevated.  The left ventricular systolic function is normal.  A STENT RESOLUTE [id number] drug eluting stent was successfully placed.  Prox LAD to Mid LAD lesion, 100 %stenosed.  Post intervention, there is a 0% residual stenosis.   Acute ST segment elevation myocardial infarction secondary to total occlusion of the LAD immediately after the takeoff of the first diagonal and septal perforating artery.  There was TIMI 0 flow.  Mild luminal irregularity of the left circumflex vessel without significant obstructive stenoses.  Dominant normal RCA.  Successful PCI to the totally occluded LAD which resulted in a long segment of occlusion and ultimately treated with PTCA and DES stenting with a Resolute onyx 3.038 mm stent postdilated to 3.25 mm with 100% occlusion being reduced to 0% and TIMI 0 flow being improved to TIMI 3 flow.  Transient heart block treated with IV atropine.  Preserved global LV contractility with subtle apical inferior focal hypocontractility.  The [facility] at ~ 8:45 AM.  [facility] at approximately 10:45 AM.  During balloon [facility] was ~ 20 minutes.  RECOMMENDATION: The patient will continue with dual antiplatelet therapy for minimum of a year.  He will be started on high potency statin therapy, low-dose beta blocker therapy, with ultimate ACE inhibition.  Metformin will be held for 48 hours postcontrast.       Patient Profile     61 y.o. male postop day #1 anterior STEMI treated with PCI and drug-eluting stenting to his proximal to mid LAD by Dr. Tresa EndoKelly. His risk factors include family history, treated hypertension, diabetes and hyperlipidemia. His troponin peaked at 20. His LV function was normal. He had no other significant residual CAD. Procedure was done femorally. He has remained hemodynamically and electrocardiographically  stable.  Assessment & Plan    1: Coronary artery disease-postop day one anterolateral STEMI treated with PCI and drug-eluting stenting using a 3.0 x 38 mm long resolute on extruding stent to the proximal to mid LAD with excellent  angiographic result. His peak troponin was 20. He is asymptomatic and pain-free. His EF was normal. He had no residual CAD in his other vessels. He is on dual antibiotic therapy as was other routine post-MI medications.  2: Hyperlipidemia-on high-dose statin therapy  3: Essential hypertension-the patient was on felodipine, losartan and hydrochlorothiazide as well as carvedilol as an outpatient. He currently is on metoprolol. Blood pressures are running slightly high. I will restart his losartan and we will titrate his medications.   Transfer to telemetry, cardiac rehabilitation, most likely discharge home tomorrow, return office visit with Dr. Tresa Endo.   Alphonsus Sias, MD  08/05/2017, 8:02 AM

## 2017-08-05 NOTE — Progress Notes (Signed)
CARDIAC REHAB PHASE I   PRE:  Rate/Rhythm: 78 SR  BP:  Supine:   Sitting: 149/88  Standing:    SaO2: 96%RA  MODE:  Ambulation: 740 ft   POST:  Rate/Rhythm: 93 SR  BP:  Supine:   Sitting: 157/93  Standing:    SaO2: 96%RA 1108-1211 Pt walked 740 ft with steady gait. No CP. Tolerated well. MI education completed with pt and wife who voiced understanding. Stressed importance of briliinta with stent. Has seen case Production designer, theatre/television/filmmanager. Reviewed NTG use,risk factors, ex ed, carb counting and heart healthy food choices, MI restrictions and CRP 2. [facility].   Luetta Nuttingharlene Darrnell Mangiaracina, RN BSN  08/05/2017 12:08 PM

## 2017-08-06 ENCOUNTER — Telehealth: Payer: Self-pay | Admitting: Cardiovascular Disease

## 2017-08-06 ENCOUNTER — Other Ambulatory Visit: Payer: Self-pay | Admitting: Physician Assistant

## 2017-08-06 ENCOUNTER — Other Ambulatory Visit: Payer: Self-pay | Admitting: Internal Medicine

## 2017-08-06 ENCOUNTER — Telehealth: Payer: Self-pay | Admitting: *Deleted

## 2017-08-06 ENCOUNTER — Encounter (HOSPITAL_COMMUNITY): Payer: Self-pay | Admitting: Physician Assistant

## 2017-08-06 DIAGNOSIS — I10 Essential (primary) hypertension: Secondary | ICD-10-CM

## 2017-08-06 DIAGNOSIS — E782 Mixed hyperlipidemia: Secondary | ICD-10-CM

## 2017-08-06 DIAGNOSIS — I25118 Atherosclerotic heart disease of native coronary artery with other forms of angina pectoris: Secondary | ICD-10-CM

## 2017-08-06 DIAGNOSIS — I2510 Atherosclerotic heart disease of native coronary artery without angina pectoris: Secondary | ICD-10-CM

## 2017-08-06 DIAGNOSIS — E876 Hypokalemia: Secondary | ICD-10-CM

## 2017-08-06 LAB — HEMOGLOBIN A1C
HEMOGLOBIN A1C: 9.8 % — AB (ref 4.8–5.6)
MEAN PLASMA GLUCOSE: 235 mg/dL

## 2017-08-06 LAB — GLUCOSE, CAPILLARY
GLUCOSE-CAPILLARY: 327 mg/dL — AB (ref 65–99)
GLUCOSE-CAPILLARY: 212 mg/dL — AB (ref 65–99)

## 2017-08-06 MED ORDER — NITROGLYCERIN 0.4 MG SL SUBL: 0.4000 mg | SUBLINGUAL_TABLET | SUBLINGUAL | 3 refills | Status: DC | PRN

## 2017-08-06 MED ORDER — METOPROLOL TARTRATE 25 MG PO TABS: 25.0000 mg | ORAL_TABLET | Freq: Two times a day (BID) | ORAL | 6 refills | Status: DC

## 2017-08-06 MED ORDER — ATORVASTATIN CALCIUM 40 MG PO TABS: 40.0000 mg | ORAL_TABLET | Freq: Every evening | ORAL | 6 refills | Status: DC

## 2017-08-06 MED ORDER — POTASSIUM CHLORIDE ER 10 MEQ PO TBCR
40.0000 meq | EXTENDED_RELEASE_TABLET | Freq: Every day | ORAL | Status: DC
  Administered 2017-08-06: 40 meq via ORAL
  Filled 2017-08-06 (×2): qty 4

## 2017-08-06 MED ORDER — ASPIRIN 81 MG PO TABS: 81.0000 mg | ORAL_TABLET | Freq: Every day | ORAL | 3 refills | Status: DC

## 2017-08-06 MED ORDER — LOSARTAN POTASSIUM 50 MG PO TABS
50.0000 mg | ORAL_TABLET | Freq: Once | ORAL | Status: AC
  Administered 2017-08-06: 50 mg via ORAL
  Filled 2017-08-06: qty 1

## 2017-08-06 MED ORDER — TICAGRELOR 90 MG PO TABS: 90.0000 mg | ORAL_TABLET | Freq: Two times a day (BID) | ORAL | 11 refills | Status: DC

## 2017-08-06 MED ORDER — ATORVASTATIN CALCIUM 40 MG PO TABS: 40.0000 mg | ORAL_TABLET | Freq: Every day | ORAL | 1 refills | Status: DC

## 2017-08-06 MED ORDER — LOSARTAN POTASSIUM 100 MG PO TABS: 100.0000 mg | ORAL_TABLET | Freq: Every day | ORAL | 6 refills | Status: DC

## 2017-08-06 NOTE — Progress Notes (Signed)
Progress Note  Patient Name: Patrick [name] Date of Encounter: 08/06/2017  Primary Cardiologist: Tresa Endo  Subjective   No chest pain or shortness of breath this morning. Up ambulating in the halls this morning. Postop day #2 anterior STEMI treated with PCI and drug-eluting stenting of mid LAD.  Inpatient Medications    Scheduled Meds: . aspirin  81 mg Oral Daily  . atorvastatin  80 mg Oral q1800  . insulin aspart  0-15 Units Subcutaneous TID WC  . insulin glargine  20 Units Subcutaneous QHS  . losartan  50 mg Oral Daily  . metoprolol tartrate  25 mg Oral BID  . potassium chloride  40 mEq Oral Daily  . sodium chloride flush  3 mL Intravenous Q12H  . ticagrelor  90 mg Oral BID   Continuous Infusions: . sodium chloride Stopped (08/05/17 0600)  . sodium chloride Stopped (08/06/17 0000)   PRN Meds: sodium chloride, acetaminophen, hydrALAZINE, nitroGLYCERIN, ondansetron (ZOFRAN) IV, sodium chloride flush, zolpidem   Vital Signs    Vitals:   08/05/17 1719 08/05/17 1942 08/06/17 0528 08/06/17 0851  BP: (!) 158/88 138/87 (!) 146/90 (!) 146/90  Pulse: 80 79 83 94  Resp: 19 18 18    Temp: 98.8 F (37.1 C) 98.1 F (36.7 C) 98.3 F (36.8 C)   TempSrc: Oral Oral Oral   SpO2: 97% 96% 96%   Weight: 232 lb 1.6 oz (105.3 kg)  229 lb 12.8 oz (104.2 kg)   Height: 6' (1.829 m)       Intake/Output Summary (Last 24 hours) at 08/06/17 0952 Last data filed at 08/06/17 0600  Gross per 24 hour  Intake              840 ml  Output                0 ml  Net              840 ml   Filed Weights   08/04/17 1214 08/05/17 1719 08/06/17 0528  Weight: 236 lb 5.3 oz (107.2 kg) 232 lb 1.6 oz (105.3 kg) 229 lb 12.8 oz (104.2 kg)    Telemetry    Normal sinus rhythm - Personally Reviewed  ECG    None performed today Personally Reviewed  Physical Exam   GEN: No acute distress.   Neck: No JVD Cardiac: RRR, no murmurs, rubs, or gallops.  Respiratory: Clear to auscultation bilaterally. GI:  Soft, nontender, non-distended  MS: No edema; No deformity. Neuro:  Nonfocal  Psych: Normal affect  Extremity-right femoral artery puncture site looks well-healed   Labs    Chemistry  Recent Labs Lab 08/04/17 1005 08/04/17 1050 08/04/17 1054 08/05/17 0507  NA 135 135 136 138  K 4.3 4.7 4.7 3.4*  CL 100* 99* 98* 105  CO2 23  --   --  24  GLUCOSE 347* 377* 367* 174*  BUN 28* 29* 28* 17  CREATININE 1.23 1.00 1.20 1.16  CALCIUM 9.1  --   --  8.6*  PROT 6.6  --   --   --   ALBUMIN 4.1  --   --   --   AST 36  --   --   --   ALT 55  --   --   --   ALKPHOS 35*  --   --   --   BILITOT 0.7  --   --   --   GFRNONAA >60  --   --  >60  GFRAA >60  --   --  >60  ANIONGAP 12  --   --  9     Hematology  Recent Labs Lab 08/04/17 0948 08/04/17 1050 08/04/17 1054 08/05/17 0507  WBC 6.6  --   --  9.5  RBC 4.56  --   --  4.50  HGB 13.7 12.2* 12.6* 13.5  HCT 38.1* 36.0* 37.0* 37.7*  MCV 83.6  --   --  83.8  MCH 30.0  --   --  30.0  MCHC 36.0  --   --  35.8  RDW 12.2  --   --  12.4  PLT 238  --   --  218    Cardiac Enzymes  Recent Labs Lab 08/04/17 1005 08/04/17 1500 08/04/17 2307 08/05/17 0507  TROPONINI <0.03 10.17* 20.22* 15.87*     Recent Labs Lab 08/04/17 1011  TROPIPOC 0.06     BNPNo results for input(s): BNP, PROBNP in the last 168 hours.   DDimer No results for input(s): DDIMER in the last 168 hours.   Radiology    Dg Chest 2 View  Result Date: 08/04/2017 CLINICAL DATA:  Chest pain. EXAM: CHEST  2 VIEW COMPARISON:  CT abdomen pelvis dated November 08, 2016. FINDINGS: The cardiomediastinal silhouette is normal in size. Normal pulmonary vascularity. Atherosclerotic calcification of the aortic arch. Low lung volumes are present, causing crowding of the pulmonary vasculature. No focal consolidation, pleural effusion, or pneumothorax. Peripheral opacity in the lingula likely corresponds to atelectasis/scarring seen on prior CT. No acute osseous abnormality.  IMPRESSION: No active cardiopulmonary disease. Electronically Signed   By: Obie Dredge M.D.   On: 08/04/2017 10:02    Cardiac Studies   Cardiac catheterization/PCI and drug-eluting stenting (08/04/17).  Conclusion     LV end diastolic pressure is mildly elevated.  The left ventricular systolic function is normal.  A STENT RESOLUTE [id number] drug eluting stent was successfully placed.  Prox LAD to Mid LAD lesion, 100 %stenosed.  Post intervention, there is a 0% residual stenosis.   Acute ST segment elevation myocardial infarction secondary to total occlusion of the LAD immediately after the takeoff of the first diagonal and septal perforating artery.  There was TIMI 0 flow.  Mild luminal irregularity of the left circumflex vessel without significant obstructive stenoses.  Dominant normal RCA.  Successful PCI to the totally occluded LAD which resulted in a long segment of occlusion and ultimately treated with PTCA and DES stenting with a Resolute onyx 3.038 mm stent postdilated to 3.25 mm with 100% occlusion being reduced to 0% and TIMI 0 flow being improved to TIMI 3 flow.  Transient heart block treated with IV atropine.  Preserved global LV contractility with subtle apical inferior focal hypocontractility.  The patient presented to [facility] emergency room at ~ 8:45 AM.  He presented to [facility] catheterization lab at approximately 10:45 AM.  During balloon time from presentation to [facility] catheterization laboratory was ~ 20 minutes.  RECOMMENDATION: The patient will continue with dual antiplatelet therapy for minimum of a year.  He will be started on high potency statin therapy, low-dose beta blocker therapy, with ultimate ACE inhibition.  Metformin will be held for 48 hours postcontrast.       Patient Profile     61 y.o. male postop day #1 anterior STEMI treated with PCI and drug-eluting stenting to his proximal to mid LAD by Dr. Tresa Endo. His risk factors include  family history, treated hypertension, diabetes and hyperlipidemia. His troponin peaked  at 20. His LV function was normal. He had no other significant residual CAD. Procedure was done femorally. He has remained hemodynamically and electrocardiographically stable.  Assessment & Plan    1: Coronary artery disease-postop day #2 anterolateral STEMI treated with PCI and drug-eluting stenting using a 3.0 x 38 mm long resolute on extruding stent to the proximal to mid LAD with excellent angiographic result. His peak troponin was 20. He is asymptomatic and pain-free. His EF was normal. He had no residual CAD in his other vessels. He is on dual antiplatelet therapy as was other routine post-MI medications.  2: Hyperlipidemia-on high-dose statin therapy  3: Essential hypertension-the patient was on felodipine, losartan and hydrochlorothiazide as well as carvedilol as an outpatient. He currently is on metoprolol. Blood pressures are running slightly high. I will restart his losartan and we will titrate his medications.   Ambulate with cardiac rehabilitation yesterday and walked this morning. He is completely asymptomatic. Postop day 2 anterior STEMI treated with PCI and drug-eluting stenting of the mid LAD. His troponins peaked at 20. He had normal LV function. He is on dual antiplatelet therapy including aspirin and Brilenta as well as other standard post MI pharmacology. His vital signs are stable. His potassium is 3.4 which will be repleted. He is stable for discharge home today and will make arrangements to follow-up with Dr. Nicholaus Bloom in 7-10 days.   Alphonsus Sias, MD  08/06/2017, 9:52 AM

## 2017-08-06 NOTE — Plan of Care (Signed)
Problem: Health Behavior/Discharge Planning: Goal: Ability to manage health-related needs will improve Outcome: Completed/Met Date Met: 08/06/17 Patient will be discharged per MD order, All discharge instructions given to patient in written format and verbally.

## 2017-08-06 NOTE — Progress Notes (Signed)
1020 Pt has been walking independently without CP. No questions re ed done yesterday. For discharge. Luetta Nutting RN BSN 08/06/2017 10:20 AM

## 2017-08-06 NOTE — Discharge Summary (Signed)
Discharge Summary    Patient ID: Patrick [name],  MRN: [id number], DOB/AGE: [birth year] 61 y.o.  Admit date: 08/04/2017 Discharge date: 08/06/2017  Primary Care Provider: Kirk [name] Primary Cardiologist: Dr. Claiborne [name]  Discharge Diagnoses    Principal Problem:   ST elevation myocardial infarction involving left anterior descending (LAD) coronary artery [facility]) Active Problems:   CAD in native artery   Type 2 diabetes mellitus with complication, without long-term current use of insulin (HCC)   Mixed hyperlipidemia   Essential hypertension   Hypokalemia    Diagnostic Studies/Procedures    Procedures   Coronary/Graft Acute MI Revascularization  LEFT HEART CATH AND CORONARY ANGIOGRAPHY  Conclusion     LV end diastolic pressure is mildly elevated.  The left ventricular systolic function is normal.  A STENT RESOLUTE [id number] drug eluting stent was successfully placed.  Prox LAD to Mid LAD lesion, 100 %stenosed.  Post intervention, there is a 0% residual stenosis.   Acute ST segment elevation myocardial infarction secondary to total occlusion of the LAD immediately after the takeoff of the first diagonal and septal perforating artery.  There was TIMI 0 flow.  Mild luminal irregularity of the left circumflex vessel without significant obstructive stenoses.  Dominant normal RCA.  Successful PCI to the totally occluded LAD which resulted in a long segment of occlusion and ultimately treated with PTCA and DES stenting with a Resolute onyx 3.038 mm stent postdilated to 3.25 mm with 100% occlusion being reduced to 0% and TIMI 0 flow being improved to TIMI 3 flow.  Transient heart block treated with IV atropine.  Preserved global LV contractility with subtle apical inferior focal hypocontractility.  The patient presented to [facility] emergency room at ~ 8:45 AM.  He presented to [facility] catheterization lab at approximately 10:45 AM.  During balloon time from  presentation to [facility] catheterization laboratory was ~ 20 minutes.  RECOMMENDATION: The patient will continue with dual antiplatelet therapy for minimum of a year.  He will be started on high potency statin therapy, low-dose beta blocker therapy, with ultimate ACE inhibition.  Metformin will be held for 48 hours postcontrast.   Indications   ST elevation myocardial infarction involving left anterior descending (LAD) coronary artery ([place]) [I21.02 (ICD-10-CM)]  Procedural Details/Technique   Technical Details Mr. Patrick [name] is a 61 year old gentleman from [place] who has a history of diabetes mellitus, hyperlipidemia, and hypertension. He developed new onset severe substernal chest pressure this morning at approximately 845 while at work. He presented to [facility] long emergency room. His ECG showed right bundle branch block with anterior ST elevation. A code STEMI was activated and is now transported to [facility] catheterization laboratory for emergent catheterization. In the [facility] emergency room, he received heparin 4000 units.  When I arrived in the laboratory, the patient's groin had been prepped, since the patient who was about to come into the laboratory was to be done through the femoral approach and the equipment was already on the table awaiting that procedure. The procedure was done through the femoral approach. The right femoral artery was punctured anteriorly and a sheath was inserted without difficulty. Since was felt that it was an LAD occlusion, the plan was to quickly look at the RCA and then inserted a guiding catheter into the left coronary system. However, the RCA catheter was not able to selectively engage the RCA. Attention was then immediately directed at the left system. A 6 [place] XB LAD 3.5 guide was then inserted into a 6  French sheath. The patient transiently developed significant bradycardia and received atropine 0.5 mg. With the demonstration of total proximal occlusion of  the first diagonal vessel, a Choice PT moderate support wire was inserted after administration of bivalirudin and Brilinta 180 mg. The wire was able to cross the occlusion. Initial dilatation at the occluded site was done with a 2.512 balloon, but this did not result in recurrent flow. Further inflations were made. More distally. He was felt that a more distal mid lesion was the cause for the total occlusion. Following PTCA throughout this segment. Flow was restored. A 3.038 mm Resolute Onyx DES stent was then inserted to cover the entire diseased segment and was deployed at 13 and 14 atm. Post stent dilatation was done with a 3.2530 mm noncompliant balloon. Angiography confirmed an excellent result with the 100% occlusion reduced to 0% and resumption of brisk TIMI-3 flow from initial TIMI 0 flow. Attention was then redirected at the RCA. A 5 French 3 DRC catheter was then used and the RCA was selectively engaged for angiography. A 5 French pigtail catheter was used for left ventriculography and LV pressure recording. The arterial sheath was sutured in place. The patient will be maintained on bivalirudin for completion of the second bag. The sheath was pulled 2 hours after bivalirudin discontinuance. The patient tolerated the procedure well and returned to his room with stable hemodynamics.   Estimated blood loss <50 mL.  During this procedure the patient was administered the following to achieve and maintain moderate conscious sedation: Versed 2 mg, Fentanyl 50 mcg, while the patient's heart rate, blood pressure, and oxygen saturation were continuously monitored. The period of conscious sedation was 51 minutes, of which I was present face-to-face 100% of this time.    Coronary Findings   Dominance: Right  Left Anterior Descending  The LAD is a large-caliber vessel was totally occluded after the first septal and diagonal vessel. There was TIMI 0 flow  Prox LAD to Mid LAD lesion, 100% stenosed.    Angioplasty: Pre-stent angioplasty was performed using a BALLOON SAPPHIRE 2.5X12. A STENT RESOLUTE [id number] drug eluting stent was successfully placed. Stent strut is well apposed. Post-stent angioplasty was performed using a BALLOON [place] EMERGE MR 3.25X30. There is no pre-interventional antegrade distal flow (TIMI 0). The post-interventional distal flow is normal (TIMI 3). The intervention was successful . No complications occurred at this lesion.  There is no residual stenosis post intervention.  Left Circumflex  The left circumflex vessel had mild luminal irregularity without high-grade focal stenoses. The vessel exhibits minimal luminal irregularities.  First Obtuse Marginal Branch  The vessel exhibits minimal luminal irregularities.  Right Coronary Artery  The RCA had a slightly superior takeoff. The vessel was dominant and was free of significant disease.  Wall Motion              Left Heart   Left Ventricle The left ventricular size is normal. The left ventricular systolic function is normal. LV end diastolic pressure is mildly elevated. Overall LV function is preserved. There is very focal apical inferior hypocontractility. Global ejection fraction is 55%.    Coronary Diagrams   Diagnostic Diagram       Post-Intervention Diagram           _____________     History of Present Illness     Patrick [name] is a 61 y.o. male with history of HTN, GERD, NIDDM, HL and overactive bladder who presented to [facility] with CP and found  to have STEMI. He has no prior history of CAD but a family history of such. He has been followed by primary care for his chronic issues. He had previously discontinued statin due to worry about side effects. He presented to the [facility] ED with several hours of chest pressure that was substernal in nature and non radiating. EKG there showed anterolateral ST elevation. Code STEMI was called. He was given 342m ASA, heparin, and transferred emergently to the cath  lab.   Hospital Course    1. STEMI/CAD - cath 08/04/17 showed 100% prox-mid LAD lesion treated with DES. Peak troponin 20. No significant residual disease in other vessels. He had transient heart block treated with IV atropine in the cath lab but no sustained arrhythmias. LVEDP was mildly elevated, LVEF 55%. He was started on standard post MI therapy including DAPT with ASA/Brilinta. Received 30 day free Brilinta rx and refills. We have stopped saw palmetto due to interaction with blood thinners (can increase risk of bleeding).  2. Hyperlipidemia - transitioned to high-dose statin therapy. Lipid panel with trig 370, HDL 29, LDL 101. Due to patient concerns over prior side effects, he was sent home on atorvastatin 466mqpm. If the patient is tolerating statin at time of follow-up appointment, would consider rechecking liver function/lipid panel in 6-8 weeks.  3. Essential hypertension - on felodipine, losartan/HCTZ, carvedilol as OP. Meds were transitioned this admission to metoprolol and losartan. On day of DC, losartan was titrated to 10055maily. Can f/u as OP to determine further adjustment.  4. DM - A1C 9.8. Instructed on importance of f/u PCP for this.  5. Hypokalemia - K was 3.4 yesterday, given 29m68mhis AM. He was on potassium supplement as outpatient prior to admission (question due to HCTZ?). His potassium had dropped in the hospital despite not receiving HCTZ, thus will continue home supplement for now. Would recommend recheck as OP.  He was advised to resume Metformin tomorrow AM. We removed oxycodone, Zofran, and tamsulosin from medicine list since he indicated he was not taking it. Work note was provided. Dr. BerrGwenlyn Found seen and examined the patient today and feels she is stable for discharge.  _____________  Discharge Vitals Blood pressure (!) 146/90, pulse 94, temperature 98.3 F (36.8 C), temperature source Oral, resp. rate 18, height 6' (1.829 m), weight 229 lb 12.8 oz (104.2  kg), SpO2 96 %.  Filed Weights   08/04/17 1214 08/05/17 1719 08/06/17 0528  Weight: 236 lb 5.3 oz (107.2 kg) 232 lb 1.6 oz (105.3 kg) 229 lb 12.8 oz (104.2 kg)    Labs & Radiologic Studies    CBC  Recent Labs  08/04/17 0948  08/04/17 1054 08/05/17 0507  WBC 6.6  --   --  9.5  HGB 13.7  < > 12.6* 13.5  HCT 38.1*  < > 37.0* 37.7*  MCV 83.6  --   --  83.8  PLT 238  --   --  218  < > = values in this interval not displayed. Basic Metabolic Panel  Recent Labs  08/04/17 1005  08/04/17 1054 08/05/17 0507  NA 135  < > 136 138  K 4.3  < > 4.7 3.4*  CL 100*  < > 98* 105  CO2 23  --   --  24  GLUCOSE 347*  < > 367* 174*  BUN 28*  < > 28* 17  CREATININE 1.23  < > 1.20 1.16  CALCIUM 9.1  --   --  8.6*  < > =  values in this interval not displayed. Liver Function Tests  Recent Labs  08/04/17 1005  AST 36  ALT 55  ALKPHOS 35*  BILITOT 0.7  PROT 6.6  ALBUMIN 4.1   Cardiac Enzymes  Recent Labs  08/04/17 1500 08/04/17 2307 08/05/17 0507  TROPONINI 10.17* 20.22* 15.87*   Hemoglobin A1C  Recent Labs  08/05/17 0507  HGBA1C 9.8*   Fasting Lipid Panel  Recent Labs  08/04/17 1005  CHOL 204*  HDL 29*  LDLCALC 101*  TRIG 370*  CHOLHDL 7.0   _____________  Dg Chest 2 View  Result Date: 08/04/2017 CLINICAL DATA:  Chest pain. EXAM: CHEST  2 VIEW COMPARISON:  CT abdomen pelvis dated November 08, 2016. FINDINGS: The cardiomediastinal silhouette is normal in size. Normal pulmonary vascularity. Atherosclerotic calcification of the aortic arch. Low lung volumes are present, causing crowding of the pulmonary vasculature. No focal consolidation, pleural effusion, or pneumothorax. Peripheral opacity in the lingula likely corresponds to atelectasis/scarring seen on prior CT. No acute osseous abnormality. IMPRESSION: No active cardiopulmonary disease. Electronically Signed   By: Titus Dubin M.D.   On: 08/04/2017 10:02   Disposition   Pt is being discharged home today in  good condition.  Follow-up Plans & Appointments    Follow-up Information    Erma Heritage, PA-C Follow up.   Specialties:  Physician Assistant, Cardiology Why:  [place] office near [facility] - 08/13/17 at 9:30am. Must arrive 15 minutes early to check in on time. Bernerd Pho is one of the PAs that works with Dr. Claiborne [name]. Contact information: [address] [place] [address] [phone]          Discharge Instructions    Amb Referral to Cardiac Rehabilitation    Complete by:  As directed    Add work number [phone]   In addition to other phone numbers   Diagnosis:   STEMI Coronary Stents     Diet - low sodium heart healthy    Complete by:  As directed    Increase activity slowly    Complete by:  As directed    No driving for 2 weeks. No lifting over 10 lbs for 4 weeks. No sexual activity for 4 weeks. You may not return to work until cleared by your cardiologist. Keep procedure site clean & dry. If you notice increased pain, swelling, bleeding or pus, call/return!  You may shower, but no soaking baths/hot tubs/pools for 1 week.   Metformin has to be interrupted for 48 hours after a heart cath. You can restart this tomorrow morning.  We have stopped saw palmetto due to interaction with your blood thinners (can increase risk of bleeding).  Several of your blood pressure medicines have changed forms and doses. Please pay close attention to your medication list. If you have ANY questions at all, do not hesitate to call our office.  We removed oxycodone, Zofran, and tamsulosin from your medicine list since you indicated you were not taking it.      Discharge Medications   Allergies as of 08/06/2017   No Known Allergies     Medication List    STOP taking these medications   carvedilol 6.25 MG tablet Commonly known as:  COREG   felodipine 10 MG 24 hr tablet Commonly known as:  PLENDIL   losartan-hydrochlorothiazide  100-12.5 MG tablet Commonly known as:  HYZAAR   ondansetron 4 MG disintegrating tablet Commonly known as:  ZOFRAN ODT   oxyCODONE-acetaminophen 5-325 MG  tablet Commonly known as:  ROXICET   saw palmetto 160 MG capsule   tamsulosin 0.4 MG Caps capsule Commonly known as:  FLOMAX     TAKE these medications   ABC PLUS SENIOR PO Take 1 tablet by mouth daily.   aspirin 81 MG tablet Take 1 tablet (81 mg total) by mouth daily.   atorvastatin 40 MG tablet Commonly known as:  LIPITOR Take 1 tablet (40 mg total) by mouth every evening.   fenofibrate 160 MG tablet Take 160 mg by mouth daily.   Fish Oil 1200 MG Caps Take 1,200 mg by mouth daily.   glimepiride 2 MG tablet Commonly known as:  AMARYL Take 2 mg by mouth daily with breakfast.   GLUCOSAMINE 1500 COMPLEX PO Take 1,500 mg by mouth daily.   losartan 100 MG tablet Commonly known as:  COZAAR Take 1 tablet (100 mg total) by mouth daily.   Magnesium 250 MG Tabs Take 250 mg by mouth daily.   Magnesium Citrate 100 MG Tabs Take 100 mg by mouth daily.   metformin 500 MG (OSM) 24 hr tablet Commonly known as:  FORTAMET Take 2,000 mg by mouth 2 (two) times daily with a meal.   metoprolol tartrate 25 MG tablet Commonly known as:  LOPRESSOR Take 1 tablet (25 mg total) by mouth 2 (two) times daily.   MYRBETRIQ 25 MG Tb24 tablet Generic drug:  mirabegron ER Take 25 mg by mouth at bedtime.   nitroGLYCERIN 0.4 MG SL tablet Commonly known as:  NITROSTAT Place 1 tablet (0.4 mg total) under the tongue every 5 (five) minutes x 3 doses as needed for chest pain.   pantoprazole 40 MG tablet Commonly known as:  PROTONIX Take 40 mg by mouth daily.   Potassium 99 MG Tabs Take 99 mg by mouth daily.   pyridOXINE 50 MG tablet Commonly known as:  VITAMIN B-6 Take 50 mg by mouth daily.   ticagrelor 90 MG Tabs tablet Commonly known as:  BRILINTA Take 1 tablet (90 mg total) by mouth 2 (two) times daily.   vitamin C 500 MG  tablet Commonly known as:  ASCORBIC ACID Take 500 mg by mouth daily.   vitamin E 400 UNIT capsule Take 400 Units by mouth daily.        Allergies:  No Known Allergies  Aspirin prescribed at discharge?  Yes High Intensity Statin Prescribed? (Lipitor 40-69m or Crestor 20-449m: Yes Beta Blocker Prescribed? Yes For EF <40%, was ACEI/ARB Prescribed? No: N/A ADP Receptor Inhibitor Prescribed? (i.e. Plavix etc.-Includes Medically Managed Patients): Yes For EF <40%, Aldosterone Inhibitor Prescribed? No: N/A Was EF assessed during THIS hospitalization? Yes Was Cardiac Rehab II ordered? (Included Medically managed Patients): Yes   Outstanding Labs/Studies   If the patient is tolerating statin at time of follow-up appointment, would consider rechecking liver function/lipid panel in 6-8 weeks. Would also consider repeat BMET at time of f/u to reassess potassium.  Duration of Discharge Encounter   Greater than 30 minutes including physician time.  Signed, DaCharlie PitterA-C 08/06/2017, 10:54 AM

## 2017-08-06 NOTE — Telephone Encounter (Signed)
Patrick [name] is calling because he has a few questions , he was just Discharged from the hospital. Please call

## 2017-08-06 NOTE — Telephone Encounter (Signed)
TOC PT .Patrick [name]

## 2017-08-06 NOTE — Telephone Encounter (Signed)
LEFT HEART CATH AND CORONARY ANGIOGRAPHY W/STENTS on 08/04/2017 - 08/06/2017 (2 days)-[facility] [place], verified appt 08-13-17 @ 930am. Pt had medication questions, answered states no further questions.

## 2017-08-09 NOTE — Telephone Encounter (Signed)
The pt is following up in the [employer] office with Soundra Pilon, PA-c on 08/13/2017 at 9:30. Will forward to NL Triage.

## 2017-08-09 NOTE — Telephone Encounter (Signed)
Patient contacted regarding discharge from [facility] on 08/06/17.  Patient understands to follow up with provider B. Strader PA on 08/13/17 at 0930 at [facility]. -- provided directions to office Patient understands discharge instructions? YES Patient understands medications and regiment? YES Patient understands to bring all medications to this visit? YES

## 2017-08-12 NOTE — Progress Notes (Signed)
Cardiology Office Note    Date:  08/13/2017   ID:  Patrick [name], DOB May 24, 1956, MRN [id number]  PCP:  Lauro Regulus, MD  Cardiologist: Dr. Tresa Endo  Chief Complaint  Patient presents with  . Hospitalization Follow-up    s/p STEMI    History of Present Illness:    Patrick [name] is a 61 y.o. male with past medical history of HTN, GERD, NIDDM, and HLD who presents to the office today for hospital follow-up.   He was recently admitted from 8/15 - 08/06/2017 for evaluation of chest pain. His initial EKG showed ST elevation along the anterolateral leads, therefore CODE STEMI was activated and he was transported to [facility] for an emergent cardiac catheterization. This showed 100% occlusion of the prox-mid LAD which was successfully treated with a DES. While in the cath lab, he had transient episodes of heart block but no recurrence was noted on telemetry the rest of his admission. Troponin values peaked at 20.22. He was started on DAPT with ASA and Brilinta along with BB and statin therapy. His PTA Losartan was titrated during his hospitalization to further assist with BP control.   In talking with the patient today, he reports overall doing well since his recent hospitalization. He denies any recurrent episodes of chest discomfort. No dyspnea on exertion, orthopnea, PND, or lower extremity edema. He has started walking 15 minutes per morning and 15 minutes in the evening, denying any exertional symptoms with this. He has significantly changed his diet and is no longer consuming fast food. He is focusing more on consuming fish and vegetables.  He reports good compliance with his medication regimen and denies missing any doses of ASA or Brilinta. No complications regarding his groin cath site. He does not check his blood pressure regularly but it is well-controlled at 124/78 during today's visit.   Past Medical History:  Diagnosis Date  . CAD in native artery    a. anterolateral STEMI 07/2017  s/p DES to prox-mid LAD, EF 55%.  Marland Kitchen GERD (gastroesophageal reflux disease)   . Hyperlipidemia   . Hypertension   . Overactive bladder   . Renal calculi   . Uncontrolled diabetes mellitus (HCC)     Past Surgical History:  Procedure Laterality Date  . CORONARY/GRAFT ACUTE MI REVASCULARIZATION N/A 08/04/2017   Procedure: Coronary/Graft Acute MI Revascularization;  Surgeon: Lennette Bihari, MD;  [facility]: [facility] IN[facility]AS[facility] CV LAB;  Service: Cardiovascular;  Laterality: N/A;  . LEFT HEART CATH AND CORONARY ANGIOGRAPHY N/A 08/04/2017   Procedure: LEFT HEART CATH AND CORONARY ANGIOGRAPHY;  Surgeon: Lennette Bihari, MD;  Location: [facility] INVASIVE CV LAB;  Service: Cardiovascular;  Laterality: N/A;  . PROSTATE SURGERY      Current Medications: Outpatient Medications Prior to Visit  Medication Sig Dispense Refill  . aspirin 81 MG tablet Take 1 tablet (81 mg total) by mouth daily. 90 tablet 3  . atorvastatin (LIPITOR) 40 MG tablet Take 1 tablet (40 mg total) by mouth every evening. 30 tablet 6  . fenofibrate 160 MG tablet Take 160 mg by mouth daily.    Marland Kitchen glimepiride (AMARYL) 2 MG tablet Take 2 mg by mouth daily with breakfast.    . Glucosamine-Chondroit-Vit C-Mn (GLUCOSAMINE 1500 COMPLEX PO) Take 1,500 mg by mouth daily.    Marland Kitchen losartan (COZAAR) 100 MG tablet Take 1 tablet (100 mg total) by mouth daily. 30 tablet 6  . Magnesium 250 MG TABS Take 250 mg by mouth daily.    Marland Kitchen  Magnesium Citrate 100 MG TABS Take 100 mg by mouth daily.    . metformin (FORTAMET) 500 MG (OSM) 24 hr tablet Take 2,000 mg by mouth 2 (two) times daily with a meal.     . metoprolol tartrate (LOPRESSOR) 25 MG tablet Take 1 tablet (25 mg total) by mouth 2 (two) times daily. 60 tablet 6  . Multiple Vitamins-Minerals (ABC PLUS SENIOR PO) Take 1 tablet by mouth daily.    Marland Kitchen MYRBETRIQ 25 MG TB24 tablet Take 25 mg by mouth at bedtime.   3  . nitroGLYCERIN (NITROSTAT) 0.4 MG SL tablet Place 1 tablet (0.4 mg total) under the tongue every 5 (five)  minutes x 3 doses as needed for chest pain. 25 tablet 3  . Omega-3 Fatty Acids (FISH OIL) 1200 MG CAPS Take 1,200 mg by mouth daily.    . pantoprazole (PROTONIX) 40 MG tablet Take 40 mg by mouth daily.    . Potassium 99 MG TABS Take 99 mg by mouth daily.    Marland Kitchen pyridOXINE (VITAMIN B-6) 50 MG tablet Take 50 mg by mouth daily.    . ticagrelor (BRILINTA) 90 MG TABS tablet Take 1 tablet (90 mg total) by mouth 2 (two) times daily. 60 tablet 11  . vitamin C (ASCORBIC ACID) 500 MG tablet Take 500 mg by mouth daily.    . vitamin E 400 UNIT capsule Take 400 Units by mouth daily.     No facility-administered medications prior to visit.      Allergies:   Patient has no known allergies.   Social History   Social History  . Marital status: Married    Spouse name: N/A  . Number of children: N/A  . Years of education: N/A   Social History Main Topics  . Smoking status: Never Smoker  . Smokeless tobacco: Never Used  . Alcohol use No  . Drug use: No  . Sexual activity: Not Asked   Other Topics Concern  . None   Social History Narrative  . None     Family History:  The patient's family history includes Heart attack in his father; Heart disease in his father.   Review of Systems:   Please see the history of present illness.     General:  No chills, fever, night sweats or weight changes.  Cardiovascular:  No chest pain, dyspnea on exertion, edema, orthopnea, palpitations, paroxysmal nocturnal dyspnea. Dermatological: No rash, lesions/masses Respiratory: No cough, dyspnea Urologic: No hematuria, dysuria Abdominal:   No nausea, vomiting, diarrhea, bright red blood per rectum, melena, or hematemesis Neurologic:  No visual changes, wkns, changes in mental status.  He denies any of the above symptoms.   All other systems reviewed and are otherwise negative except as noted above.   Physical Exam:    VS:  BP 124/78   Pulse 65   Ht 6' (1.829 m)   Wt 229 lb (103.9 kg)   BMI 31.06 kg/m     General: Well developed, well nourished Caucasian male appearing in no acute distress. Head: Normocephalic, atraumatic, sclera non-icteric, no xanthomas, nares are without discharge.  Neck: No carotid bruits. JVD not elevated.  Lungs: Respirations regular and unlabored, without wheezes or rales.  Heart: Regular rate and rhythm. No S3 or S4.  No murmur, no rubs, or gallops appreciated. Abdomen: Soft, non-tender, non-distended with normoactive bowel sounds. No hepatomegaly. No rebound/guarding. No obvious abdominal masses. Msk:  Strength and tone appear normal for age. No joint deformities or effusions. Extremities: No clubbing  or cyanosis. No lower extremity edema.  Distal pedal pulses are 2+ bilaterally. Neuro: Alert and oriented X 3. Moves all extremities spontaneously. No focal deficits noted. Psych:  Responds to questions appropriately with a normal affect. Skin: No rashes or lesions noted  Wt Readings from Last 3 Encounters:  08/13/17 229 lb (103.9 kg)  08/06/17 229 lb 12.8 oz (104.2 kg)  11/08/16 220 lb (99.8 kg)     Studies/Labs Reviewed:   EKG:  EKG is ordered today.  The ekg ordered today demonstrates NSR, HR 65, with known RBBB. ST elevation resolved when compared to prior tracings.   Recent Labs: 08/04/2017: ALT 55 08/05/2017: BUN 17; Creatinine, Ser 1.16; Hemoglobin 13.5; Platelets 218; Potassium 3.4; Sodium 138   Lipid Panel    Component Value Date/Time   CHOL 204 (H) 08/04/2017 1005   TRIG 370 (H) 08/04/2017 1005   HDL 29 (L) 08/04/2017 1005   CHOLHDL 7.0 08/04/2017 1005   VLDL 74 (H) 08/04/2017 1005   LDLCALC 101 (H) 08/04/2017 1005    Additional studies/ records that were reviewed today include:   Cardiac Catheterization: 08/04/2017  LV end diastolic pressure is mildly elevated.  The left ventricular systolic function is normal.  A STENT RESOLUTE [id number] drug eluting stent was successfully placed.  Prox LAD to Mid LAD lesion, 100 %stenosed.  Post  intervention, there is a 0% residual stenosis.   Acute ST segment elevation myocardial infarction secondary to total occlusion of the LAD immediately after the takeoff of the first diagonal and septal perforating artery.  There was TIMI 0 flow.  Mild luminal irregularity of the left circumflex vessel without significant obstructive stenoses.  Dominant normal RCA.  Successful PCI to the totally occluded LAD which resulted in a long segment of occlusion and ultimately treated with PTCA and DES stenting with a Resolute onyx 3.038 mm stent postdilated to 3.25 mm with 100% occlusion being reduced to 0% and TIMI 0 flow being improved to TIMI 3 flow.  Transient heart block treated with IV atropine.  Preserved global LV contractility with subtle apical inferior focal hypocontractility.  The patient presented to [facility] emergency room at ~ 8:45 AM.  He presented to [facility] catheterization lab at approximately 10:45 AM.  During balloon time from presentation to [facility] catheterization laboratory was ~ 20 minutes.   Assessment:    1. ST elevation myocardial infarction (STEMI), subsequent episode of care (HCC)   2. Coronary artery disease involving native coronary artery of native heart without angina pectoris   3. Essential hypertension   4. Hyperlipidemia LDL goal <70   5. Gastroesophageal reflux disease without esophagitis   6. Type 2 diabetes mellitus with complication, without long-term current use of insulin (HCC)      Plan:   In order of problems listed above:  1. Subsequent Episode of Care for STEMI/CAD - recently admitted for chest pain and found to have an anterior STEMI. His cardiac catheterization showed 100% occlusion of the prox-mid LAD which was successfully treated with a DES. Started on DAPT with ASA and Brilinta along with BB and statin therapy.  - he denies any recurrent episodes of chest discomfort or dyspnea on exertion. Has been walking twice daily without any  exertional symptoms. He is unable to attend cardiac rehab due to his work schedule but will continue to exercise daily at home. He carries SL NTG with him on a daily basis.  - continue ASA, Brilinta, Lopressor, and Lipitor.    2. HTN - BP  is well-controlled at 124/78 during today's visit. - continue Losartan 100mg  daily and Lopressor 25mg  BID.   3. HLD - Lipid Panel during recent admission showed total cholesterol of 204, HDL 29, and LDL 101. Goal LDL is < 70 with known CAD. - has been started on Atorvastatin 40mg  daily. Will need repeat FLP/LFT's in 4 weeks.   4. GERD - no recent symptoms.  - remains on Protonix 40mg  daily.   5. Type 2 DM - Hgb A1c elevated to 9.8 during his recent hospitalization. Remains on Glimepiride and Metformin. Has made significant diet and lifestyle changes since his recent MI. Congratulated on this.  - Being followed by his PCP.    Medication Adjustments/Labs and Tests Ordered: Current medicines are reviewed at length with the patient today.  Concerns regarding medicines are outlined above.  Medication changes, Labs and Tests ordered today are listed in the Patient Instructions below. Patient Instructions  Medication Instructions: Your physician recommends that you continue on your current medications as directed. Please refer to the Current Medication list given to you today.  If you need a refill on your cardiac medications before your next appointment, please call your pharmacy.   Follow-Up: Your physician wants you to follow-up in: 3 months with Dr. Tresa Endo. You will receive a reminder letter in the mail two months in advance. If you don't receive a letter, please call our office to schedule this follow-up appointment.  Thank you for choosing Heartcare at [facility]!!     Signed, Ellsworth Lennox, PA-C  08/13/2017 12:06 PM    [facility] [address] [place], [address] [place], [place]  [address] Phone: [phone]; Fax: [phone]  [address] [place], [place] [address] Phone: [phone]

## 2017-08-13 ENCOUNTER — Encounter: Payer: Self-pay | Admitting: Student

## 2017-08-13 ENCOUNTER — Ambulatory Visit (INDEPENDENT_AMBULATORY_CARE_PROVIDER_SITE_OTHER): Payer: 59 | Admitting: Student

## 2017-08-13 VITALS — BP 124/78 | HR 65 | Ht 72.0 in | Wt 229.0 lb

## 2017-08-13 DIAGNOSIS — E785 Hyperlipidemia, unspecified: Secondary | ICD-10-CM | POA: Diagnosis not present

## 2017-08-13 DIAGNOSIS — E118 Type 2 diabetes mellitus with unspecified complications: Secondary | ICD-10-CM

## 2017-08-13 DIAGNOSIS — I10 Essential (primary) hypertension: Secondary | ICD-10-CM

## 2017-08-13 DIAGNOSIS — I2510 Atherosclerotic heart disease of native coronary artery without angina pectoris: Secondary | ICD-10-CM | POA: Diagnosis not present

## 2017-08-13 DIAGNOSIS — I213 ST elevation (STEMI) myocardial infarction of unspecified site: Secondary | ICD-10-CM | POA: Diagnosis not present

## 2017-08-13 DIAGNOSIS — K219 Gastro-esophageal reflux disease without esophagitis: Secondary | ICD-10-CM

## 2017-08-13 NOTE — Patient Instructions (Signed)
Medication Instructions: Your physician recommends that you continue on your current medications as directed. Please refer to the Current Medication list given to you today.  If you need a refill on your cardiac medications before your next appointment, please call your pharmacy.    Follow-Up: Your physician wants you to follow-up in: 3 months with Dr. Tresa Endo. You will receive a reminder letter in the mail two months in advance. If you don't receive a letter, please call our office to schedule this follow-up appointment.     Thank you for choosing Heartcare at [facility]!!

## 2017-11-16 ENCOUNTER — Encounter: Payer: Self-pay | Admitting: Cardiovascular Disease

## 2017-11-16 ENCOUNTER — Ambulatory Visit (INDEPENDENT_AMBULATORY_CARE_PROVIDER_SITE_OTHER): Payer: 59 | Admitting: Cardiovascular Disease

## 2017-11-16 VITALS — BP 158/82 | HR 66 | Ht 72.0 in | Wt 231.2 lb

## 2017-11-16 DIAGNOSIS — E782 Mixed hyperlipidemia: Secondary | ICD-10-CM

## 2017-11-16 DIAGNOSIS — E669 Obesity, unspecified: Secondary | ICD-10-CM

## 2017-11-16 DIAGNOSIS — I10 Essential (primary) hypertension: Secondary | ICD-10-CM | POA: Diagnosis not present

## 2017-11-16 DIAGNOSIS — K219 Gastro-esophageal reflux disease without esophagitis: Secondary | ICD-10-CM

## 2017-11-16 DIAGNOSIS — I2510 Atherosclerotic heart disease of native coronary artery without angina pectoris: Secondary | ICD-10-CM

## 2017-11-16 DIAGNOSIS — E118 Type 2 diabetes mellitus with unspecified complications: Secondary | ICD-10-CM

## 2017-11-16 DIAGNOSIS — I213 ST elevation (STEMI) myocardial infarction of unspecified site: Secondary | ICD-10-CM | POA: Diagnosis not present

## 2017-11-16 DIAGNOSIS — E785 Hyperlipidemia, unspecified: Secondary | ICD-10-CM

## 2017-11-16 MED ORDER — METOPROLOL TARTRATE 25 MG PO TABS: ORAL_TABLET | ORAL | 3 refills | Status: DC

## 2017-11-16 MED ORDER — ICOSAPENT ETHYL 1 G PO CAPS: 4.0000 g | ORAL_CAPSULE | Freq: Two times a day (BID) | ORAL | 3 refills | Status: DC

## 2017-11-16 NOTE — Progress Notes (Signed)
Cardiology Office Note    Date:  11/16/2017   ID:  Patrick [name], DOB 02-28-1956, MRN [id number]  PCP:  Kirk Ruths, MD  Cardiologist:  Shelva Majestic, MD   Initial office visit follow-up with me following his STEMI.  History of Present Illness:  Patrick [name] is a 61 y.o. male who suffered an anterior ST segment elevation myocardial infarction on 08/04/2017.  He presents for his initial office visit follow-up evaluation with me.  Patrick [name] has a history of hypertension, hyperlipidemia, and diabetes mellitus.  He was admitted on 08/04/2017 with ST segment elevation anterolaterally and a code STEMI was activated.  I performed emergent cardiac catheterization which revealed total occlusion of his LAD after the first diagonal vessel.  There was a long segment of occlusion and ultimately a Resolute on a 3.038 mm stent was inserted, postdilated 3.25 mm with 100% occlusion being reduced to 0% and TIMI 0 flow been improved to TIMI 3 flow.  He developed transient heart block during the procedure which required IV atropine.  Of note, his lipid panel during that evaluation was consistent with an atherogenic dyslipidemic pattern with triglycerides of 370, VLDL 74, low HDL at 29, and his total cholesterol was 204 with LDL 101.  He was started on atorvastatin and also was on fenofibrate.  Subsequent, please fenofibrate was discontinued by his primary physician and he has been taking over-the-counter fish oil.  Repeat blood work 1 month later by his primary physician showed a total cholesterol 124, LDL 47, triglycerides were 251, HDL was 26.5.  Over the past several months, he has done well.  He denies any definitive recurrent anginal symptomatology.  He had experienced 1 vague episode of H sensation after working 16 hours a day for 3 days and having to go to another business trip in [place].  He states that he has been walking daily anywhere from one 1:45 miles per day and does this without chest pain.   He is unaware of palpitations.  He presents for evaluation.  Past Medical History:  Diagnosis Date  . CAD in native artery    a. anterolateral STEMI 07/2017 s/p DES to prox-mid LAD, EF 55%.  Marland Kitchen GERD (gastroesophageal reflux disease)   . Hyperlipidemia   . Hypertension   . Overactive bladder   . Renal calculi   . Uncontrolled diabetes mellitus ([place])     Past Surgical History:  Procedure Laterality Date  . CORONARY/GRAFT ACUTE MI REVASCULARIZATION N/A 08/04/2017   Procedure: Coronary/Graft Acute MI Revascularization;  Surgeon: Troy Sine, MD;  Location: [facility];  Service: Cardiovascular;  Laterality: N/A;  . LEFT HEART CATH AND CORONARY ANGIOGRAPHY N/A 08/04/2017   Procedure: LEFT HEART CATH AND CORONARY ANGIOGRAPHY;  Surgeon: Troy Sine, MD;  Location: [facility];  Service: Cardiovascular;  Laterality: N/A;  . PROSTATE SURGERY      Current Medications: Outpatient Medications Prior to Visit  Medication Sig Dispense Refill  . aspirin 81 MG tablet Take 1 tablet (81 mg total) by mouth daily. 90 tablet 3  . atorvastatin (LIPITOR) 40 MG tablet Take 1 tablet (40 mg total) by mouth every evening. 30 tablet 6  . Coenzyme Q10 200 MG capsule Take 200 mg by mouth daily.    Marland Kitchen glimepiride (AMARYL) 2 MG tablet Take 2 mg by mouth daily with breakfast.    . Glucosamine-Chondroit-Vit C-Mn (GLUCOSAMINE 1500 COMPLEX PO) Take 1,500 mg by mouth daily.    Marland Kitchen losartan (COZAAR)  100 MG tablet Take 1 tablet (100 mg total) by mouth daily. 30 tablet 6  . Magnesium 250 MG TABS Take 250 mg by mouth daily.    . Magnesium Citrate 100 MG TABS Take 100 mg by mouth daily.    . metformin (FORTAMET) 500 MG (OSM) 24 hr tablet Take 2,000 mg by mouth 2 (two) times daily with a meal.     . Multiple Vitamins-Minerals (ABC PLUS SENIOR PO) Take 1 tablet by mouth daily.    Marland Kitchen MYRBETRIQ 25 MG TB24 tablet Take 25 mg by mouth at bedtime.   3  . nitroGLYCERIN (NITROSTAT) 0.4 MG SL tablet Place 1 tablet (0.4  mg total) under the tongue every 5 (five) minutes x 3 doses as needed for chest pain. 25 tablet 3  . Omega-3 Fatty Acids (FISH OIL) 1200 MG CAPS Take 1,200 mg by mouth daily.    . pantoprazole (PROTONIX) 40 MG tablet Take 40 mg by mouth daily.    . Potassium 99 MG TABS Take 99 mg by mouth daily.    Marland Kitchen pyridOXINE (VITAMIN B-6) 50 MG tablet Take 50 mg by mouth daily.    . ticagrelor (BRILINTA) 90 MG TABS tablet Take 1 tablet (90 mg total) by mouth 2 (two) times daily. 60 tablet 11  . vitamin C (ASCORBIC ACID) 500 MG tablet Take 500 mg by mouth daily.    . vitamin E 400 UNIT capsule Take 400 Units by mouth daily.    . metoprolol tartrate (LOPRESSOR) 25 MG tablet Take 1 tablet (25 mg total) by mouth 2 (two) times daily. 60 tablet 6  . fenofibrate 160 MG tablet Take 160 mg by mouth daily.     No facility-administered medications prior to visit.      Allergies:   Patient has no known allergies.   Social History   Socioeconomic History  . Marital status: Married    Spouse name: None  . Number of children: None  . Years of education: None  . Highest education level: None  Social Needs  . Financial resource strain: None  . Food insecurity - worry: None  . Food insecurity - inability: None  . Transportation needs - medical: None  . Transportation needs - non-medical: None  Occupational History  . None  Tobacco Use  . Smoking status: Never Smoker  . Smokeless tobacco: Never Used  Substance and Sexual Activity  . Alcohol use: No  . Drug use: No  . Sexual activity: None  Other Topics Concern  . None  Social History Narrative  . None     Family History:  The patient's family history includes Heart attack in his father; Heart disease in his father.   ROS General: Negative; No fevers, chills, or night sweats;  HEENT: Negative; No changes in vision or hearing, sinus congestion, difficulty swallowing Pulmonary: Negative; No cough, wheezing, shortness of breath,  hemoptysis Cardiovascular: Negative; No chest pain, presyncope, syncope, palpitations GI: Negative; No nausea, vomiting, diarrhea, or abdominal pain GU: Negative; No dysuria, hematuria, or difficulty voiding Musculoskeletal: Negative; no myalgias, joint pain, or weakness Hematologic/Oncology: Negative; no easy bruising, bleeding Endocrine: Positive for diabetes Neuro: Negative; no changes in balance, headaches Skin: Negative; No rashes or skin lesions Psychiatric: Negative; No behavioral problems, depression Sleep: Negative; No snoring, daytime sleepiness, hypersomnolence, bruxism, restless legs, hypnogognic hallucinations, no cataplexy Other comprehensive 14 point system review is negative.   PHYSICAL EXAM:   BP (!) 158/82   Pulse 66   Ht 6' (1.829 m)  Wt 231 lb 3.2 oz (104.9 kg)   BMI 31.36 kg/m    Repeat blood pressure was 146/82  Wt Readings from Last 3 Encounters:  11/16/17 231 lb 3.2 oz (104.9 kg)  08/13/17 229 lb (103.9 kg)  08/06/17 229 lb 12.8 oz (104.2 kg)    General: Alert, oriented, no distress.  Skin: normal turgor, no rashes, warm and dry HEENT: Normocephalic, atraumatic. Pupils equal round and reactive to light; sclera anicteric; extraocular muscles intact;  Nose without nasal septal hypertrophy Mouth/Parynx benign; Mallinpatti scale 3 Neck: No JVD, no carotid bruits; normal carotid upstroke Lungs: clear to ausculatation and percussion; no wheezing or rales Chest wall: without tenderness to palpitation Heart: PMI not displaced, RRR, s1 s2 normal, 1/6 systolic murmur, no diastolic murmur, no rubs, gallops, thrills, or heaves Abdomen: Mild central adiposity; soft, nontender; no hepatosplenomehaly, BS+; abdominal aorta nontender and not dilated by palpation. Back: no CVA tenderness Pulses 2+ Musculoskeletal: full range of motion, normal strength, no joint deformities Extremities: no clubbing cyanosis or edema, Homan's sign negative  Neurologic: grossly  nonfocal; Cranial nerves grossly wnl Psychologic: Normal mood and affect   Studies/Labs Reviewed:   EKG:  EKG is ordered today.  ECG (independently read by me): Normal sinus rhythm at 66 bpm.  Right bundle branch block with repolarization changes.  QTc interval 448 ms.  Recent Labs: BMP Latest Ref Rng & Units 08/05/2017 08/04/2017 08/04/2017  Glucose 65 - 99 mg/dL 174(H) 367(H) 377(H)  BUN 6 - 20 mg/dL 17 28(H) 29(H)  Creatinine 0.61 - 1.24 mg/dL 1.16 1.20 1.00  Sodium 135 - 145 mmol/L 138 136 135  Potassium 3.5 - 5.1 mmol/L 3.4(L) 4.7 4.7  Chloride 101 - 111 mmol/L 105 98(L) 99(L)  CO2 22 - 32 mmol/L 24 - -  Calcium 8.9 - 10.3 mg/dL 8.6(L) - -     Hepatic Function Latest Ref Rng & Units 08/04/2017 11/08/2016  Total Protein 6.5 - 8.1 g/dL 6.6 6.6  Albumin 3.5 - 5.0 g/dL 4.1 4.2  AST 15 - 41 U/L 36 31  ALT 17 - 63 U/L 55 30  Alk Phosphatase 38 - 126 U/L 35(L) 27(L)  Total Bilirubin 0.3 - 1.2 mg/dL 0.7 0.5  Bilirubin, Direct 0.1 - 0.5 mg/dL - 0.2    CBC Latest Ref Rng & Units 08/05/2017 08/04/2017 08/04/2017  WBC 4.0 - 10.5 K/uL 9.5 - -  Hemoglobin 13.0 - 17.0 g/dL 13.5 12.6(L) 12.2(L)  Hematocrit 39.0 - 52.0 % 37.7(L) 37.0(L) 36.0(L)  Platelets 150 - 400 K/uL 218 - -   Lab Results  Component Value Date   MCV 83.8 08/05/2017   MCV 83.6 08/04/2017   MCV 88.0 11/08/2016   No results found for: TSH Lab Results  Component Value Date   HGBA1C 9.8 (H) 08/05/2017     BNP No results found for: BNP  ProBNP No results found for: PROBNP   Lipid Panel     Component Value Date/Time   CHOL 204 (H) 08/04/2017 1005   TRIG 370 (H) 08/04/2017 1005   HDL 29 (L) 08/04/2017 1005   CHOLHDL 7.0 08/04/2017 1005   VLDL 74 (H) 08/04/2017 1005   LDLCALC 101 (H) 08/04/2017 1005     RADIOLOGY: No results found.   Additional studies/ records that were reviewed today include:   Emergent cardiac catheterization/PCI 08/04/2017: Conclusion     LV end diastolic pressure is mildly  elevated.  The left ventricular systolic function is normal.  A STENT RESOLUTE [id number] drug eluting stent was  successfully placed.  Prox LAD to Mid LAD lesion, 100 %stenosed.  Post intervention, there is a 0% residual stenosis.   Acute ST segment elevation myocardial infarction secondary to total occlusion of the LAD immediately after the takeoff of the first diagonal and septal perforating artery.  There was TIMI 0 flow.  Mild luminal irregularity of the left circumflex vessel without significant obstructive stenoses.  Dominant normal RCA.  Successful PCI to the totally occluded LAD which resulted in a long segment of occlusion and ultimately treated with PTCA and DES stenting with a Resolute onyx 3.038 mm stent postdilated to 3.25 mm with 100% occlusion being reduced to 0% and TIMI 0 flow being improved to TIMI 3 flow.  Transient heart block treated with IV atropine.  Preserved global LV contractility with subtle apical inferior focal hypocontractility.  The patient presented to [facility] emergency room at ~ 8:45 AM.  He presented to [facility] catheterization lab at approximately 10:45 AM.  During balloon time from presentation to [facility] catheterization laboratory was ~ 20 minutes.  RECOMMENDATION: The patient will continue with dual antiplatelet therapy for minimum of a year.  He will be started on high potency statin therapy, low-dose beta blocker therapy, with ultimate ACE inhibition.  Metformin will be held for 48 hours postcontrast.     ASSESSMENT:    1. ST elevation myocardial infarction (STEMI), subsequent episode of care ([place])   2. Coronary artery disease involving native coronary artery of native heart without angina pectoris   3. Essential hypertension   4. Hyperlipidemia LDL goal <70   5. Mixed hyperlipidemia   6. Type 2 diabetes mellitus with complication, without long-term current use of insulin (H[facility])   7. Gastroesophageal reflux disease without esophagitis    8. Mild obesity      PLAN:  Mr. Valdez Brannan is a 61 year old gentleman who has a history of hypertension, hyperlipidemia, and diabetes mellitus who presented with an acute anterior wall ST segment elevation myocardial infarctions and was found to have total occlusion of his LAD after the first diagonal vessel.  He was found to have a long occluded segment which was successfully treated with DES stenting with a 3.038 mm Resolute DES stent.  Subsequently, he has done well.  He has been walking on a daily basis.  BMI today is mildly increased at 31.36.  Weight reduction was recommended.  He also has mild central adiposity and discussed with him that this type of fat typically is associated with greater levels of inflammation.  His blood pressure today is mildly elevated and he has been taking losartan 100 mg daily, and metoprolol 25 mg twice a day.  I will increase his metoprolol to 37.5 mg in the morning and will continue with 25 MG in the evening.  ECG shows normal sinus rhythm with right bundle branch block and does not show any ECG evidence for his myocardial infarction following successful reperfusion.  He has a dyslipidemic atherogenic lipid panel with high triglycerides, high VLDL, and low HDL, and I suspect he is a significantly increased in the number of small LDL particles.  His lipids have improved with the addition of atorvastatin, but his over-the-counter preparation of fish oil only has 380 mg of active DHA /EPA out of the 1200 mg capsule.  I discussed with him the recent Reduce-it a trial with Vascepa.  I will provide him with samples and have recommended 2 g twice a day for additional cardiovascular event reduction of benefit.  He continues to  be on aspirin and Brilinta for dual antiplatelet therapy and admits to some easy bruisability but no active bleeding.  He is diabetic on glimepiride and metformin.  He has GERD on pantoprazole.  He tells me his primary physician, [name] will be  rechecking laboratory in late January.  I will see him in 4 months for cardiology reevaluation.   Medication Adjustments/Labs and Tests Ordered: Current medicines are reviewed at length with the patient today.  Concerns regarding medicines are outlined above.  Medication changes, Labs and Tests ordered today are listed in the Patient Instructions below. There are no Patient Instructions on file for this visit.   Signed, Shelva Majestic, MD  11/16/2017 10:13 AM    [facility] [address] [place], [place], [place]  [address] Phone: [phone]

## 2017-11-16 NOTE — Addendum Note (Signed)
Addended by: Chana BodeGREEN, Rashawn Rayman L on: 11/16/2017 04:48 PM   Modules accepted: Orders

## 2017-11-16 NOTE — Patient Instructions (Signed)
Medication Instructions:  INCREASE- Metoprolol 37.5 mg(1 1/2 tablets) in the morning and 25 mg(1 tablet) at night START- Vascepa 1g take 2 tablet twice a day.  If you need a refill on your cardiac medications before your next appointment, please call your pharmacy.  Labwork: None Ordered  Testing/Procedures: None Ordered   Follow-Up: Your physician wants you to follow-up in: 4 Months.  Thank you [facility] choosing [facility]!!

## 2017-11-23 ENCOUNTER — Other Ambulatory Visit: Payer: Self-pay

## 2017-11-23 ENCOUNTER — Encounter: Payer: Self-pay | Admitting: Urology

## 2017-11-23 ENCOUNTER — Ambulatory Visit (INDEPENDENT_AMBULATORY_CARE_PROVIDER_SITE_OTHER): Payer: 59 | Admitting: Urology

## 2017-11-23 VITALS — BP 156/89 | HR 62 | Ht 72.0 in | Wt 232.4 lb

## 2017-11-23 DIAGNOSIS — Z87442 Personal history of urinary calculi: Secondary | ICD-10-CM | POA: Diagnosis not present

## 2017-11-23 DIAGNOSIS — Z87898 Personal history of other specified conditions: Secondary | ICD-10-CM

## 2017-11-23 DIAGNOSIS — R350 Frequency of micturition: Secondary | ICD-10-CM | POA: Diagnosis not present

## 2017-11-23 LAB — MICROSCOPIC EXAMINATION: Epithelial Cells (non renal): NONE SEEN /hpf (ref 0–10)

## 2017-11-23 LAB — BLADDER SCAN AMB NON-IMAGING

## 2017-11-23 LAB — URINALYSIS, COMPLETE
BILIRUBIN UA: NEGATIVE
GLUCOSE, UA: NEGATIVE
Leukocytes, UA: NEGATIVE
Nitrite, UA: NEGATIVE
PROTEIN UA: NEGATIVE
RBC UA: NEGATIVE
Specific Gravity, UA: 1.025 (ref 1.005–1.030)
UUROB: 0.2 mg/dL (ref 0.2–1.0)
pH, UA: 6 (ref 5.0–7.5)

## 2017-11-23 MED ORDER — TOLTERODINE TARTRATE ER 4 MG PO CP24: 4.0000 mg | ORAL_CAPSULE | Freq: Every day | ORAL | 11 refills | Status: DC

## 2017-11-23 NOTE — Progress Notes (Signed)
11/23/2017 8:41 AM   Patrick [name] Nov 28, 1956 [id number]  Referring provider: Lauro Regulus, MD [address] [facility] [place], [place] [address]  Chief Complaint  Patient presents with  . Urinary Frequency    HPI: 61 yo M with [name] history of BPH, history of elevated PSA, kidney stones, and overactive bladder who presents today to establish care.  He was previously followed by Dr. Anola [name] and is changing urologist due to change in his insurance.  He has [name] personal history of BPH.  He is treated with Flomax in the remote past.  He also underwent cooled thermotherapy in 2008 which improved his urinary symptoms but they are slowly recurring.  He also has [name] history of elevated PSA.  His PSA was 4.2 dating back to 2006.  He is kept meticulous records since this time.  There is been significant fluctuation with PSA up to 5.5 in February 2008 just prior to his cooled thermotherapy.  His most recent PSA available to [place] today was April 2017 at which time his PSA was 3.1.  He is kept meticulous personal records of this which she brings with him today.  He believes he had 2008 and records from Dr. Amada [name] office have been requested.  He is never previously undergone prostate biopsy.  Today, she reports significant urinary symptoms which are persistent.  IPSS as below.  He is primarily bothered by urgency, frequency, nocturia x3.  He also has urinary intermittency.  He has taken multiple medications in the past for overactive bladder.  More recently, he recalls taking Myrbetriq 25 mg but has since switched to Detrol due insurance coverage issues.   Out of all the medications he has tried, he is derive the most benefit from switching his medications around intermittently.  He denies any dysuria or gross hematuria.  He denies issues with recurrent urinary tract infections.  No history of bladder stones.  He is not currently on any BPH medications.  He is [name] diabetic without  peripheral neuropathy.  His blood sugar has been fairly well controlled since he was diagnosed up until recently at the time of his acute MI.  No history of sleep apnea.  He believes his voiding dysfunction may be lifelong.  He has had urinary frequency since he was [name] teenager.  He reports that even when growing up, he played instruments for several hours at [name] time and then how would have [name] short break to urinate.  During the short breaks, he trained his bladder to empty and recalls feeling severe and significant urgency when he was not able to void.   He does have [name] personal history of kidney stones.  He was able to pass this spontaneously without surgical intervention in the past.   IPSS    Row Name 11/23/17 1500         International Prostate Symptom Score   How often have you had the sensation of not emptying your bladder?  Less than 1 in 5     How often have you had to urinate less than every two hours?  More than half the time     How often have you found you stopped and started again several times when you urinated?  More than half the time     How often have you found it difficult to postpone urination?  About half the time     How often have you had [name] weak urinary stream?  Less  than half the time     How often have you had to strain to start urination?  Less than 1 in 5 times     How many times did you typically get up at night to urinate?  3 Times     Total IPSS Score  18       Quality of Life due to urinary symptoms   If you were to spend the rest of your life with your urinary condition just the way it is now how would you feel about that?  Mixed         Score:  1-7 Mild 8-19 Moderate 20-35 Severe    PMH: Past Medical History:  Diagnosis Date  . CAD in native artery    [name]. anterolateral STEMI 07/2017 s/p DES to prox-mid LAD, EF 55%.  Patrick [name]. GERD (gastroesophageal reflux disease)   . Hyperlipidemia   . Hypertension   . Overactive bladder   . Renal calculi   .  Uncontrolled diabetes mellitus [facility](HCC)     Surgical History: Past Surgical History:  Procedure Laterality Date  . CORONARY/GRAFT ACUTE MI REVASCULARIZATION N/[name] 08/04/2017   Procedure: Coronary/Graft Acute MI Revascularization;  Surgeon: Patrick BihariKelly, Thomas A, MD;  [facility];  Service: Cardiovascular;  Laterality: N/[name];  . LEFT HEART CATH AND CORONARY ANGIOGRAPHY N/[name] 08/04/2017   Procedure: LEFT HEART CATH AND CORONARY ANGIOGRAPHY;  Surgeon: Patrick BihariKelly, Thomas A, MD;  Location: [facility] INVASIVE CV LAB;  Service: Cardiovascular;  Laterality: N/[name];  . PROSTATE SURGERY      Home Medications:  Allergies as of 11/23/2017   No Known Allergies     Medication List        Accurate as of 11/23/17 11:59 PM. Always use your most recent med list.          ABC PLUS SENIOR PO Take 1 tablet by mouth daily.   aspirin 81 MG tablet Take 1 tablet (81 mg total) by mouth daily.   atorvastatin 40 MG tablet Commonly known as:  LIPITOR Take 1 tablet (40 mg total) by mouth every evening.   Coenzyme Q10 200 MG capsule Take 200 mg by mouth daily.   Fish Oil 1200 MG Caps Take 1,200 mg by mouth daily.   glimepiride 2 MG tablet Commonly known as:  AMARYL Take 2 mg by mouth daily with breakfast.   GLUCOSAMINE 1500 COMPLEX PO Take 1,500 mg by mouth daily.   Icosapent Ethyl 1 g Caps Commonly known as:  VASCEPA Take 4 g by mouth 2 (two) times daily after [name] meal.   losartan 100 MG tablet Commonly known as:  COZAAR Take 1 tablet (100 mg total) by mouth daily.   Magnesium 250 MG Tabs Take 250 mg by mouth daily.   Magnesium Citrate 100 MG Tabs Take 100 mg by mouth daily.   metformin 500 MG (OSM) 24 hr tablet Commonly known as:  FORTAMET Take 2,000 mg by mouth 2 (two) times daily with [name] meal.   metoprolol tartrate 25 MG tablet Commonly known as:  LOPRESSOR Take 37.5 mg (1 1/2 tablets) in the morning and 25 mg (1 tablets) at night   MYRBETRIQ 25 MG Tb24 tablet Generic drug:  mirabegron  ER Take 25 mg by mouth at bedtime.   nitroGLYCERIN 0.4 MG SL tablet Commonly known as:  NITROSTAT Place 1 tablet (0.4 mg total) under the tongue every 5 (five) minutes x 3 doses as needed for chest pain.   pantoprazole 40 MG tablet Commonly known  as:  PROTONIX Take 40 mg by mouth daily.   Potassium 99 MG Tabs Take 99 mg by mouth daily.   pyridOXINE 50 MG tablet Commonly known as:  VITAMIN B-6 Take 50 mg by mouth daily.   ticagrelor 90 MG Tabs tablet Commonly known as:  BRILINTA Take 1 tablet (90 mg total) by mouth 2 (two) times daily.   tolterodine 4 MG 24 hr capsule Commonly known as:  DETROL LA Take 1 capsule (4 mg total) by mouth daily.   vitamin C 500 MG tablet Commonly known as:  ASCORBIC ACID Take 500 mg by mouth daily.   vitamin E 400 UNIT capsule Take 400 Units by mouth daily.       Allergies: No Known Allergies  Family History: Family History  Problem Relation Age of Onset  . Heart disease Father   . Heart attack Father     Social History:  reports that  has never smoked. he has never used smokeless tobacco. He reports that he does not drink alcohol or use drugs.  ROS: UROLOGY Frequent Urination?: Yes Hard to postpone urination?: Yes Burning/pain with urination?: No Get up at night to urinate?: Yes Leakage of urine?: Yes Urine stream starts and stops?: Yes Trouble starting stream?: No Do you have to strain to urinate?: No Blood in urine?: No Urinary tract infection?: No Sexually transmitted disease?: No Injury to kidneys or bladder?: No Painful intercourse?: No Weak stream?: No Erection problems?: Yes Penile pain?: No  Gastrointestinal Nausea?: No Vomiting?: No Indigestion/heartburn?: No Diarrhea?: No Constipation?: No  Constitutional Fever: No Night sweats?: No Weight loss?: No Fatigue?: No  Skin Skin rash/lesions?: No Itching?: No  Eyes Blurred vision?: No Double vision?: No  Ears/Nose/Throat Sore throat?: No Sinus  problems?: No  Hematologic/Lymphatic Swollen glands?: No Easy bruising?: Yes  Cardiovascular Leg swelling?: No Chest pain?: No  Respiratory Cough?: No Shortness of breath?: No  Endocrine Excessive thirst?: No  Musculoskeletal Back pain?: No Joint pain?: No  Neurological Headaches?: No Dizziness?: No  Psychologic Depression?: No Anxiety?: No  Physical Exam: BP (!) 156/89 (BP Location: Left Arm, Patient Position: Sitting, Cuff Size: Large)   Pulse 62   Ht 6' (1.829 m)   Wt 232 lb 6.4 oz (105.4 kg)   BMI 31.52 kg/m   Constitutional:  Alert and oriented, No acute distress. HEENT: [place] AT, moist mucus membranes.  Trachea midline, no masses. Cardiovascular: No clubbing, cyanosis, or edema. Respiratory: Normal respiratory effort, no increased work of breathing. GI: Abdomen is soft, nontender, nondistended, no abdominal masses GU: No CVA tenderness.  Rectal:  Mildly decreased sphincter tone.  50 cc prostate without nodules. Skin: No rashes, bruises or suspicious lesions. Lymph: No cervical or inguinal adenopathy. Neurologic: Grossly intact, no focal deficits, moving all 4 extremities. Psychiatric: Normal mood and affect.  Laboratory Data: Lab Results  Component Value Date   WBC 9.5 08/05/2017   HGB 13.5 08/05/2017   HCT 37.7 (L) 08/05/2017   MCV 83.8 08/05/2017   PLT 218 08/05/2017    Lab Results  Component Value Date   CREATININE 1.16 08/05/2017    Lab Results  Component Value Date   HGBA1C 9.8 (H) 08/05/2017    Urinalysis Lab Results  Component Value Date   SPECGRAV 1.025 11/23/2017   PHUR 6.0 11/23/2017   COLORU Yellow 11/23/2017   APPEARANCEUR Clear 11/23/2017   LEUKOCYTESUR Negative 11/23/2017   PROTEINUR Negative 11/23/2017   GLUCOSEU Negative 11/23/2017   KETONESU Trace ([name]) 11/23/2017   RBCU Negative  11/23/2017   BILIRUBINUR Negative 11/23/2017   UUROB 0.2 11/23/2017   NITRITE Negative 11/23/2017    Lab Results  Component Value Date     LABMICR See below: 11/23/2017   WBCUA 0-5 11/23/2017   RBCUA 0-2 11/23/2017   LABEPIT None seen 11/23/2017   BACTERIA Few ([name]) 11/23/2017    Pertinent Imaging:  Results for orders placed during the hospital encounter of 11/08/16  CT Renal Stone Study   Narrative CLINICAL DATA:  Left lower back pain.  EXAM: CT ABDOMEN AND PELVIS WITHOUT CONTRAST  TECHNIQUE: Multidetector CT imaging of the abdomen and pelvis was performed following the standard protocol without IV contrast.  COMPARISON:  None.  FINDINGS: Lower chest: Calcified hilar and mediastinal nodes, incompletely imaged, likely granulomatous.  Hepatobiliary: Calcified gallstones within the gallbladder lumen. No significant liver lesions. No bile duct dilatation.  Pancreas: Unremarkable. No pancreatic ductal dilatation or surrounding inflammatory changes.  Spleen: Normal in size without focal abnormality except for [name] few calcified granulomata.  Adrenals/Urinary Tract: Both adrenals are normal.  There is hydronephrosis and hydroureter on the left, with 2 calculi at the ureterovesical junction, each measuring 4 mm. No other urinary calculi are evident. Right kidney, collecting system and ureter appear normal.  Stomach/Bowel: Stomach is within normal limits. Appendix is normal. There are scattered left hemicolon diverticula. The colon is otherwise normal. No evidence of bowel wall thickening, distention, or inflammatory changes.  Vascular/Lymphatic: Aortic atherosclerosis. No enlarged abdominal or pelvic lymph nodes.  Reproductive: Prostatic calcifications.  Other: No ascites.  Small fat containing umbilical hernia.  Musculoskeletal: No acute or significant osseous findings.  IMPRESSION: 1. There are 2 calculi at the left ureterovesical junction, each measuring 4 mm. There is moderate hydronephrosis and hydroureter. No other urinary calculi. No other acute findings. 2. Cholelithiasis 3. Diverticulosis 4.  Aortic atherosclerosis 5. Small fat containing umbilical hernia   Electronically Signed   By: Ellery Plunkaniel R Mitchell M.D.   On: 11/08/2016 05:44    Above CT scan was personally reviewed with the patient today.    Results for orders placed or performed in visit on 11/23/17  Microscopic Examination  Result Value Ref Range   WBC, UA 0-5 0 - 5 /hpf   RBC, UA 0-2 0 - 2 /hpf   Epithelial Cells (non renal) None seen 0 - 10 /hpf   Casts Present ([name]) None seen /lpf   Cast Type Hyaline casts N/[name]   Bacteria, UA Few ([name]) None seen/Few  Urinalysis, Complete  Result Value Ref Range   Specific Gravity, UA 1.025 1.005 - 1.030   pH, UA 6.0 5.0 - 7.5   Color, UA Yellow Yellow   Appearance Ur Clear Clear   Leukocytes, UA Negative Negative   Protein, UA Negative Negative/Trace   Glucose, UA Negative Negative   Ketones, UA Trace ([name]) Negative   RBC, UA Negative Negative   Bilirubin, UA Negative Negative   Urobilinogen, Ur 0.2 0.2 - 1.0 mg/dL   Nitrite, UA Negative Negative   Microscopic Examination See below:   Bladder Scan (Post Void Residual) in office  Result Value Ref Range   Scan Result 44ml     Assessment & Plan:   1. Urinary frequency Refractory urinary symptoms despite multiple treatments, medications in the past No significant void residual, UA unremarkable We had [name] lengthy discussion today about how to proceed with his urinary symptoms which are long-standing At this point in time, urodynamics may be beneficial to evaluate his overall bladder function well as if  outlet obstruction plays [name] component in his urinary symptoms We discussed the procedure at length today.  He understands that this will be done in [place] at [facility]rology and he will return here for results. All questions answered  Continue Detrol at this time  - Urinalysis, Complete - Bladder Scan (Post Void Residual) in office  2. History of kidney stones Currently asymptomatic, will address at future visit  3.  History of elevated PSA Records from Dr. Amada [name] office requested today Rectal exam enlarged but otherwise unremarkable today  we will continue to follow   Return in about 6 weeks (around 01/04/2018) for f/u UDS results( please schedule), records (please sign release Dr. Evelene Croon).  Vanna Scotland, MD  [facility] [address] [place], [place] [address] [phone]  I spent 45 min with this patient of which greater than 50% was spent in counseling and coordination of care with the patient.

## 2017-11-25 ENCOUNTER — Telehealth: Payer: Self-pay | Admitting: Cardiovascular Disease

## 2017-11-25 NOTE — Telephone Encounter (Signed)
Spoke with pt, aware PA paperwork has been completed and once dr Tresa Endokelly signs we will fax it into the insurance.

## 2017-11-25 NOTE — Telephone Encounter (Signed)
Please call,having problem getting his Vascepa.

## 2017-11-26 ENCOUNTER — Telehealth: Payer: Self-pay | Admitting: *Deleted

## 2017-11-26 NOTE — Telephone Encounter (Signed)
PA for Vascepa completed and [facility]

## 2017-12-13 ENCOUNTER — Telehealth: Payer: Self-pay | Admitting: *Deleted

## 2017-12-13 NOTE — Telephone Encounter (Signed)
Received notification from [employer] coverage for Patrick SersVascepa was denied.   Per notification-patient must have a diagnosis of severe hypertriglyceridemia (Pre-treatment triglyceride level greater than or equal to 500 mg/dL).  Will route to Dr. Tresa EndoKelly for review/further recommendations.

## 2017-12-16 NOTE — Telephone Encounter (Signed)
If cannot take vascepa; try lovaza 2 capsules bid

## 2017-12-24 ENCOUNTER — Other Ambulatory Visit: Payer: Self-pay | Admitting: Urology

## 2017-12-28 ENCOUNTER — Other Ambulatory Visit
Admission: RE | Admit: 2017-12-28 | Discharge: 2017-12-28 | Disposition: A | Discharge status: Home / Self Care | Payer: 59 | Source: Ambulatory Visit | Attending: Internal Medicine | Admitting: Internal Medicine

## 2017-12-28 DIAGNOSIS — R0602 Shortness of breath: Secondary | ICD-10-CM | POA: Diagnosis present

## 2017-12-28 LAB — FIBRIN DERIVATIVES D-DIMER (ARMC ONLY): FIBRIN DERIVATIVES D-DIMER (ARMC): 248 ng{FEU}/mL (ref 0.00–499.00)

## 2018-01-04 ENCOUNTER — Ambulatory Visit (INDEPENDENT_AMBULATORY_CARE_PROVIDER_SITE_OTHER): Payer: 59 | Admitting: Urology

## 2018-01-04 ENCOUNTER — Encounter: Payer: Self-pay | Admitting: Urology

## 2018-01-04 VITALS — BP 164/75 | HR 75 | Ht 72.0 in | Wt 228.0 lb

## 2018-01-04 DIAGNOSIS — N5203 Combined arterial insufficiency and corporo-venous occlusive erectile dysfunction: Secondary | ICD-10-CM | POA: Diagnosis not present

## 2018-01-04 DIAGNOSIS — N398 Other specified disorders of urinary system: Secondary | ICD-10-CM | POA: Diagnosis not present

## 2018-01-04 NOTE — Progress Notes (Signed)
01/04/2018 1:46 PM   Patrick [name] Jun 16, 1956 [id number]  Referring provider: Lauro Regulus, MD [address] [facility] [place], [place] [address]  Chief Complaint  Patient presents with  . UDS results    HPI: 62 year old male who returns the office following urodynamics are significant urinary symptoms.  Urodynamics report was reviewed in detail today.  First sensation 143 mls.  Maximal capacity was 300 mils with instability appreciated and high bladder pressures.  He had multiple attempts at voiding with poor flow and low volumes.  After several of his voids, he had persistently elevated PVRs of approximately 100 cc.  Notable was increased EMG activity during all voids.  Findings are most consistent with dysfunctional voiding with instability.  There did not appear to be any bladder outlet obstruction, BOOI 7.8   He also has a history of elevated PSA.    We have received records from Dr. Amada [place] office dating back to 2004 which time his PSA was 6.8.  It appears that he had chest biopsy at this time.  He also had a biopsy in 2008 when his PSA 6.3.  His PSA appears to be fluctuating ranging anywhere from 1.6 as high as 6.8.  We do not have the results of his previous biopsies but he reports that these are negative.  His most recent PSA by Dr. Evelene Croon was 04/02/2017 at 3.1.  Symptoms today are stable.  Please see previous notes for further details.  He does also mention today that he is been struggling with erectile dysfunction.  He has a personal history of coronary artery disease status post STEMI and has a prescription for nitrates.  PMH: Past Medical History:  Diagnosis Date  . CAD in native artery    a. anterolateral STEMI 07/2017 s/p DES to prox-mid LAD, EF 55%.  Marland Kitchen GERD (gastroesophageal reflux disease)   . Hyperlipidemia   . Hypertension   . Overactive bladder   . Renal calculi   . Uncontrolled diabetes mellitus [facility])     Surgical History: Past  Surgical History:  Procedure Laterality Date  . CORONARY/GRAFT ACUTE MI REVASCULARIZATION N/A 08/04/2017   Procedure: Coronary/Graft Acute MI Revascularization;  Surgeon: Lennette Bihari, MD;  [facility]: [facility] INVASIVE CV LAB;  Service: Cardiovascular;  Laterality: N/A;  . LEFT HEART CATH AND CORONARY ANGIOGRAPHY N/A 08/04/2017   Procedure: LEFT HEART CATH AND CORONARY ANGIOGRAPHY;  Surgeon: Lennette Bihari, MD;  Location: [facility] INVASIVE CV LAB;  Service: Cardiovascular;  Laterality: N/A;  . PROSTATE SURGERY      Home Medications:  Allergies as of 01/04/2018   No Known Allergies     Medication List        Accurate as of 01/04/18  1:46 PM. Always use your most recent med list.          ABC PLUS SENIOR PO Take 1 tablet by mouth daily.   aspirin 81 MG tablet Take 1 tablet (81 mg total) by mouth daily.   atorvastatin 40 MG tablet Commonly known as:  LIPITOR Take 1 tablet (40 mg total) by mouth every evening.   Coenzyme Q10 200 MG capsule Take 200 mg by mouth daily.   Fish Oil 1200 MG Caps Take 1,200 mg by mouth daily.   glimepiride 2 MG tablet Commonly known as:  AMARYL Take 2 mg by mouth daily with breakfast.   GLUCOSAMINE 1500 COMPLEX PO Take 1,500 mg by mouth daily.   Icosapent Ethyl 1 g Caps Commonly known  as:  VASCEPA Take 4 g by mouth 2 (two) times daily after a meal.   losartan 100 MG tablet Commonly known as:  COZAAR Take 1 tablet (100 mg total) by mouth daily.   Magnesium 250 MG Tabs Take 250 mg by mouth daily.   Magnesium Citrate 100 MG Tabs Take 100 mg by mouth daily.   metformin 500 MG (OSM) 24 hr tablet Commonly known as:  FORTAMET Take 2,000 mg by mouth 2 (two) times daily with a meal.   metoprolol tartrate 25 MG tablet Commonly known as:  LOPRESSOR Take 37.5 mg (1 1/2 tablets) in the morning and 25 mg (1 tablets) at night   MYRBETRIQ 25 MG Tb24 tablet Generic drug:  mirabegron ER Take 25 mg by mouth at bedtime.   nitroGLYCERIN 0.4 MG SL  tablet Commonly known as:  NITROSTAT Place 1 tablet (0.4 mg total) under the tongue every 5 (five) minutes x 3 doses as needed for chest pain.   pantoprazole 40 MG tablet Commonly known as:  PROTONIX Take 40 mg by mouth daily.   Potassium 99 MG Tabs Take 99 mg by mouth daily.   pyridOXINE 50 MG tablet Commonly known as:  VITAMIN B-6 Take 50 mg by mouth daily.   ticagrelor 90 MG Tabs tablet Commonly known as:  BRILINTA Take 1 tablet (90 mg total) by mouth 2 (two) times daily.   tolterodine 4 MG 24 hr capsule Commonly known as:  DETROL LA Take 1 capsule (4 mg total) by mouth daily.   vitamin C 500 MG tablet Commonly known as:  ASCORBIC ACID Take 500 mg by mouth daily.   vitamin E 400 UNIT capsule Take 400 Units by mouth daily.       Allergies: No Known Allergies  Family History: Family History  Problem Relation Age of Onset  . Heart disease Father   . Heart attack Father     Social History:  reports that  has never smoked. he has never used smokeless tobacco. He reports that he does not drink alcohol or use drugs.  ROS: UROLOGY Frequent Urination?: Yes Hard to postpone urination?: Yes Burning/pain with urination?: No Get up at night to urinate?: Yes Leakage of urine?: Yes Urine stream starts and stops?: Yes Trouble starting stream?: No Do you have to strain to urinate?: No Blood in urine?: No Urinary tract infection?: No Sexually transmitted disease?: No Injury to kidneys or bladder?: No Painful intercourse?: No Weak stream?: No Erection problems?: No Penile pain?: No  Gastrointestinal Nausea?: No Vomiting?: No Indigestion/heartburn?: No Diarrhea?: No Constipation?: No  Constitutional Fever: No Night sweats?: No Weight loss?: No Fatigue?: No  Skin Skin rash/lesions?: No Itching?: No  Eyes Blurred vision?: No Double vision?: No  Ears/Nose/Throat Sore throat?: No Sinus problems?: No  Hematologic/Lymphatic Swollen glands?: No Easy  bruising?: No  Cardiovascular Leg swelling?: No Chest pain?: No  Respiratory Cough?: Yes Shortness of breath?: No  Endocrine Excessive thirst?: No  Musculoskeletal Back pain?: No Joint pain?: No  Neurological Headaches?: No Dizziness?: No  Psychologic Depression?: No Anxiety?: No  Physical Exam: BP (!) 164/75   Pulse 75   Ht 6' (1.829 m)   Wt 228 lb (103.4 kg)   BMI 30.92 kg/m   Constitutional:  Alert and oriented, No acute distress. HEENT: [place] AT, moist mucus membranes.  Trachea midline, no masses.. Neurologic: Grossly intact, no focal deficits, moving all 4 extremities. Psychiatric: Normal mood and affect.  Laboratory Data: Lab Results  Component Value Date  WBC 9.5 08/05/2017   HGB 13.5 08/05/2017   HCT 37.7 (L) 08/05/2017   MCV 83.8 08/05/2017   PLT 218 08/05/2017    Lab Results  Component Value Date   CREATININE 1.16 08/05/2017    Lab Results  Component Value Date   HGBA1C 9.8 (H) 08/05/2017    Urinalysis N/a  Pertinent Imaging: Urodynamics report was reviewed today in detail including the imaging studies.  Assessment & Plan:    1. Dysfunctional voiding of urine Urodynamics confirms that suspected underlying diagnosis of dysfunctional voiding which is likely a learned behavior Strongly recommend referral to physical therapy - Ambulatory referral to Physical Therapy  2. Combined arterial insufficiency and corporo-venous occlusive erectile dysfunction We discussed the pathophysiology of erectile dysfunction today along with possible contributing factors. Discussed possible treatment options including PDE 5 inhibitors, vacuum erectile device, intracavernosal injection, MUSE, and placement of the inflatable or malleable penal prosthesis for refractory cases.  PD 5 inhibitors are contraindicated given his history of Nitrostat use.  As such, we discussed intracavernosal injections today in detail.  He is not currently interested in inflatable  penile prosthesis.  He will call and let us know if he would like to return for injection teaching.  F/u in 3 months   Vanna ScotlandAshley Birdia Jaycox, MD  [facility] [address] [place], [place] [address] [phone]  I spent 25 min with this patient of which greater than 50% was spent in counseling and coordination of care with the patient.

## 2018-01-12 ENCOUNTER — Ambulatory Visit: Payer: 59 | Attending: Urology | Admitting: Physical Therapy

## 2018-01-12 ENCOUNTER — Encounter: Payer: Self-pay | Admitting: Physical Therapy

## 2018-01-12 ENCOUNTER — Other Ambulatory Visit: Payer: Self-pay

## 2018-01-12 DIAGNOSIS — R29898 Other symptoms and signs involving the musculoskeletal system: Secondary | ICD-10-CM | POA: Diagnosis present

## 2018-01-12 DIAGNOSIS — R278 Other lack of coordination: Secondary | ICD-10-CM | POA: Diagnosis not present

## 2018-01-12 NOTE — Telephone Encounter (Signed)
Called and spoke to patient-patient reports he recently had labs redrawn and reports the following readings:  Total cholesterol: 130 Triglycerides: 137  HDL: 35 LDL: 67   Patient is wondering with the updated results, is it necessary to start the Lovaza.   Advised Dr. Tresa EndoKelly is OOO this week but will discuss with him next week and follow up with patient.

## 2018-01-12 NOTE — Patient Instructions (Addendum)
   [email or web address]/content/[email or web address]  Please speak to your doctor about getting screened for sleep apnea    ________  Balance out bladder irritants and water for bladder health    4-6 cups of coffee  :   Decrease to 5   36 fl oz of water :  Increase to 46 fl oz (  ________  Increase midback mobility which will increase diaphragm and pelvic floor movement Open book ( handout)   ________                                                  Vincente PoliPreserve the function of your pelvic floor, abdomen, and back.              Avoid decreased straining of abdominal/pelvic floor muscles with less    slouching,  holding your breath with lifting/bowel movements)                                                     FUNCTIONAL POSTURES . When getting out of bed, do not do a sit-up. Raise arm towards ear (same side as the edge of bed you will get out of), roll towards that side to be in full sideyling position. Then drop legs off edge of bed while pushing up with        bottom arm. (see below)   ________  Balance out bladder irritants and water for bladder health    4-6 cups of coffee  :   Decrease to 5   36 fl oz of water :  Increase to 46 fl oz (  ________  Increase midback mobility which will increase diaphragm and pelvic floor movement Open book ( handout)   At work: arm swings without moving pelvis   ________

## 2018-01-14 NOTE — Therapy (Signed)
[facility] MAIN [facility] SERVICES [address] [place], [place], [address] Phone: [phone]   Fax:  [phone]  Physical Therapy Evaluation  Patient Details  Name: Patrick [name] MRN: [id number] Date of Birth: 02-27-1956 Referring Provider: Apolinar Junes   Encounter Date: 01/12/2018  PT End of Session - 01/14/18 0931    Visit Number  1    Number of Visits  12    Date for PT Re-Evaluation  04/06/18    PT Start Time  1500    PT Stop Time  1600    PT Time Calculation (min)  60 min       Past Medical History:  Diagnosis Date  . CAD in native artery    a. anterolateral STEMI 07/2017 s/p DES to prox-mid LAD, EF 55%.  Marland Kitchen GERD (gastroesophageal reflux disease)   . Hyperlipidemia   . Hypertension   . Overactive bladder   . Renal calculi   . Uncontrolled diabetes mellitus (HCC)     Past Surgical History:  Procedure Laterality Date  . CORONARY/GRAFT ACUTE MI REVASCULARIZATION N/A 08/04/2017   Procedure: Coronary/Graft Acute MI Revascularization;  Surgeon: Lennette Bihari, MD;  Location: [facility] INVASIVE CV LAB;  Service: Cardiovascular;  Laterality: N/A;  . LEFT HEART CATH AND CORONARY ANGIOGRAPHY N/A 08/04/2017   Procedure: LEFT HEART CATH AND CORONARY ANGIOGRAPHY;  Surgeon: Lennette Bihari, MD;  Location: [facility] INVASIVE CV LAB;  Service: Cardiovascular;  Laterality: N/A;  . PROSTATE SURGERY      There were no vitals filed for this visit.   Subjective Assessment - 01/14/18 0935    Subjective Pt reports he has had urinary frequency and urgency since his 20s. He started having more problems starting and stopping urination more recently within the last years and think it is due to his enlarged prostate. Pt's frequnecy varies. When he has to go urinate during a 2 hour movie, especially with actions and suspense featured in the movie.  Pt also is afraid to stand up after meetings because after he stands up, it triggers his need to go to the bathroom.  Nocturia 3-4  x for the past 15-20 years.  Pt has not had a sleep study to screen for OSA. Wife has reported to pt that pt occasionally snores.  Daily fluid intake: 4-6 cups of coffee in the morning,4-6 cups of coffee, 36 fl oz of water, 1 cup of milk.  Pt feels he has to go urinate again after going 10 mins beforehand. Pt reports occasional leakage. Pt thinks it may be a mental thing. Denied constipation with bowels 2-3 x day. Pt denied straining with bowel movements.  Denied LBP.  Pt reoprts he is unable to remain an erection which started 3-4 years ago.       Pertinent History  Pt has undergone thermal treatment to shrink prostate 7 years ago.  Sedentatry at work.  Pt walks 3.5 miles across 5-6 days a week. Heart attack 07/2017. 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 This was the schedule for 5-6 days per week for 7 years.     Patient Stated Goals  reduce the number of times going to the toilet daily          [facility] PT Assessment - 01/14/18 0933      Assessment   Medical Diagnosis  dysfunction with voiding    Referring Provider  Apolinar Junes  Precautions   Precautions  None      Restrictions   Weight Bearing Restrictions  No      Balance Screen   Has the patient fallen in the past 6 months  No      Observation/Other Assessments   Observations  leg crossed       Coordination   Gross Motor Movements are Fluid and Coordinated  -- abdominal straining w/ cue for bowel movements      Posture/Postural Control   Posture Comments  lumbopelvic perturbations with leg movements       AROM   Overall AROM Comments  spinal mobility rotation limited       Palpation   Spinal mobility  thoracic hypermobility, limited diaphragmatic excursion              Objective measurements completed on examination: See above findings.    Pelvic Floor Special Questions - 01/14/18 0932    Diastasis Recti  3 fingers width below sternum         [facility] Adult PT Treatment/Exercise - 01/14/18 0001      Manual Therapy   Manual therapy comments  STM, along thoracic paraspinals B                   PT Long Term Goals - 01/12/18 1527      PT LONG TERM GOAL #1   Title  Pt will report decreased nocturia from 3-4 x night to < 2-3 x night in order to improve sleep     Time  12    Period  Weeks    Status  New    Target Date  04/06/18      PT LONG TERM GOAL #2   Title  Pt will report decreased urinary frequency of 2-3 x to 1-2 x  during an action movie in order to enjoy his hobby    Time  10    Period  Weeks    Status  New    Target Date  03/23/18      PT LONG TERM GOAL #3   Title  Pt will notice no urge to urinate when standing up after a meeting in order to work effectively    Time  8    Period  Weeks    Status  New    Target Date  03/09/18      PT LONG TERM GOAL #4   Title  Pt will decrease his ZUNG anxiety score from  30% to < 25 % in order to decrease urinary urgency and frequency    Time  12    Period  Weeks    Status  New    Target Date  04/06/18      PT LONG TERM GOAL #5   Title  Pt will report improved bladder irritant to water ratio in order to promote bladder health    Time  2    Period  Weeks    Status  New    Target Date  01/26/18      Additional Long Term Goals   Additional Long Term Goals  Yes      PT LONG TERM GOAL #6   Title  Pt will decrease his NIH-CPSI score from  37% to <32 % in order to urinate with more control and improve QOL    Time  6    Period  Weeks    Status  New    Target Date  02/23/18  Plan - 01/14/18 0936    Clinical Impression Statement  Pt is 62 yo male who reports urinary frequency and urgency that is associated with learned behavorial patterns and enlarged prostate. Pt also reports nocturia of 3-4x night.   Pt 's clinical presentations include increased intake of bladder irritants compared to water intake, diastasis recti, and dyscoordination  of deep coordination, and poor body mechanics that placed strain on her abdominal mm and  pelvic floor.  Pt would benefit from getting screened for OSA given his iincreased risks for OSA ( i.e nocturia, snoring, cardiac conditions i.e.heart attack).   Following today's session, pt demo'd proper body mechanics to minimize strain. Pt agreed to decreasing bladder irritants and increasing water.   PT plans to contact his PCP re: recommendation for sleep study     History and Personal Factors relevant to plan of care:  Pt has undergone thermal treatment to shrink prostate 7 years ago.  Sedentatry at work.  Pt walks 3.5 miles across 5-6 days a week. Heart attack 07/2017. 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 This was the schedule for 5-6 days per week for 7 years.     Clinical Presentation  Evolving    Clinical Decision Making  Moderate    Rehab Potential  Good    PT Frequency  1x / week    PT Duration  12 weeks    PT Treatment/Interventions  Therapeutic activities;Moist Heat;Therapeutic exercise;Balance training;Neuromuscular re-education;Manual techniques;Patient/family education;Energy conservation    Consulted and Agree with Plan of Care  Patient       Patient will benefit from skilled therapeutic intervention in order to improve the following deficits and impairments:  Increased muscle spasms, Improper body mechanics, Decreased strength, Decreased mobility, Postural dysfunction, Decreased endurance, Decreased coordination, Decreased range of motion  Visit Diagnosis: Other lack of coordination  Other symptoms and signs involving the musculoskeletal system     Problem List Patient Active Problem List   Diagnosis Date Noted  . CAD in native artery 08/06/2017  . Essential hypertension 08/06/2017  . Hypokalemia 08/06/2017  . ST elevation myocardial infarction involving left anterior descending (LAD) coronary artery (HCC)    . Type 2 diabetes mellitus with complication, without long-term current use of insulin (HCC)   . Hyperlipidemia LDL goal <70     Mariane MastersYeung,Shin Yiing 01/14/2018, 9:38 AM  [facility] MAIN [facility] [address] [place], [place], [address] Phone: [phone]   Fax:  [phone]  Name: Valentina Shaggylan L Depierro MRN: [id number] Date of Birth: January 21, 1956

## 2018-01-17 ENCOUNTER — Ambulatory Visit: Payer: 59 | Admitting: Physical Therapy

## 2018-01-17 DIAGNOSIS — R29898 Other symptoms and signs involving the musculoskeletal system: Secondary | ICD-10-CM

## 2018-01-17 DIAGNOSIS — R278 Other lack of coordination: Secondary | ICD-10-CM

## 2018-01-17 NOTE — Patient Instructions (Addendum)
At home morning and night:   Open book  ( 15 reps ) from last session   Deep  core level 1 and 2 (handout) from today    Log rolling out of bed     ___________   At work:  Hip flexor  stretch  One leg forward, other one back  Front knee above front ankle    Minisquat: Scoot buttocks back slight, hinge like you are looking at your reflection on a pond  Knees behind toes,  Inhale to "smell flowers"  Exhale on the rise "like rocket"  Do not lock knees, have more weight across ballmounds of feet, toes relaxed   10 reps x 3 x day     "Pulling seat belt"   Band over door knob with knot on the other side of the door Stand perpendicular to the door,  Exhale to pull band from R ear height across body to the L pocket without turning pelvis and knees  Follow hand with eyes/head  10 x 2 reps    ______________  Start logging bladder schedule ,  emptying once every 2 hour.

## 2018-01-18 ENCOUNTER — Encounter: Payer: 59 | Admitting: Physical Therapy

## 2018-01-18 NOTE — Therapy (Signed)
[facility] MAIN [facility] SERVICES [address] [place], [place], [address] Phone: [phone]   Fax:  [phone]  Physical Therapy Treatment  Patient Details  Name: Patrick [name] MRN: [id number] Date of Birth: 1956-04-24 Referring Provider: Apolinar Junes   Encounter Date: 01/17/2018  Patrick [name] End of Session - 01/17/18 1655    Visit Number  2    Date for Patrick [name] Re-Evaluation  04/06/18    Patrick [name] Start Time  1607    Patrick [name] Stop Time  1700    Patrick [name] Time Calculation (min)  53 min    Activity Tolerance  Patient tolerated treatment well;No increased pain    Behavior During Therapy  WFL for tasks assessed/performed       Past Medical History:  Diagnosis Date  . CAD in native artery    a. anterolateral STEMI 07/2017 s/p DES to prox-mid LAD, EF 55%.  Marland Kitchen GERD (gastroesophageal reflux disease)   . Hyperlipidemia   . Hypertension   . Overactive bladder   . Renal calculi   . Uncontrolled diabetes mellitus (HCC)     Past Surgical History:  Procedure Laterality Date  . CORONARY/GRAFT ACUTE MI REVASCULARIZATION N/A 08/04/2017   Procedure: Coronary/Graft Acute MI Revascularization;  Surgeon: Lennette Bihari, MD;  Location: [facility] INVASIVE CV LAB;  Service: Cardiovascular;  Laterality: N/A;  . LEFT HEART CATH AND CORONARY ANGIOGRAPHY N/A 08/04/2017   Procedure: LEFT HEART CATH AND CORONARY ANGIOGRAPHY;  Surgeon: Lennette Bihari, MD;  Location: [facility] INVASIVE CV LAB;  Service: Cardiovascular;  Laterality: N/A;  . PROSTATE SURGERY      There were no vitals filed for this visit.  Subjective Assessment - 01/17/18 1629    Subjective  Patrick [name] reports he is drinking more water during the day and just started drinking water before coffee.   He is not sure if he is getting out of bed properly with the technique he learned from last session    Pertinent History  Patrick [name] has undergone thermal treatment to shrink prostate 7 years ago.  Sedentatry at work.  Patrick [name] walks 3.5 miles across 5-6 days a week. Heart  attack 07/2017. 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 This was the schedule for 5-6 days per week for 7 years.     Patient Stated Goals  reduce the number of times going to the toilet daily          [facility] Patrick [name] Assessment - 01/17/18 1629      Coordination   Gross Motor Movements are Fluid and Coordinated  -- R hip w/ unsmooth motion pre Tx, smoother motion post Tx      PROM   Overall PROM Comments  hip IR: R ~10 deg, post Tx ~20 deg . HIp IR L ~30 deg       Palpation   Spinal mobility  improved diaphragmatic excursion     SI assessment   limited R FADDIR ( post Tx: improved )       Bed Mobility   Bed Mobility  -- improved logrolling technique w/ cues                   [facility] Adult Patrick [name] Treatment/Exercise - 01/18/18 1651      Bed Mobility   Bed Mobility  -- improved logrolling technique w/ cues       Neuro Re-ed    Neuro Re-ed Details   see  Patrick [name] instructions. provided tactile cues for ribcage depression and co-activation of obliques/ dissassociation of trunk/pelvis and LE kinetic chain       Manual Therapy   Manual therapy comments  fascial release at sternocostal joints B.  AP mob Grade III FADDIR, R and L                    Patrick [name] Long Term Goals - 01/12/18 1527      Patrick [name] LONG TERM GOAL #1   Title  Patrick [name] will report decreased nocturia from 3-4 x night to < 2-3 x night in order to improve sleep     Time  12    Period  Weeks    Status  New    Target Date  04/06/18      Patrick [name] LONG TERM GOAL #2   Title  Patrick [name] will report decreased urinary frequency of 2-3 x to 1-2 x  during an action movie in order to enjoy his hobby    Time  10    Period  Weeks    Status  New    Target Date  03/23/18      Patrick [name] LONG TERM GOAL #3   Title  Patrick [name] will notice no urge to urinate when standing up after a meeting in order to work effectively    Time  8    Period  Weeks    Status  New    Target Date  03/09/18      Patrick [name] LONG  TERM GOAL #4   Title  Patrick [name] will decrease his ZUNG anxiety score from  30% to < 25 % in order to decrease urinary urgency and frequency    Time  12    Period  Weeks    Status  New    Target Date  04/06/18      Patrick [name] LONG TERM GOAL #5   Title  Patrick [name] will report improved bladder irritant to water ratio in order to promote bladder health    Time  2    Period  Weeks    Status  New    Target Date  01/26/18      Additional Long Term Goals   Additional Long Term Goals  Yes      Patrick [name] LONG TERM GOAL #6   Title  Patrick [name] will decrease his NIH-CPSI score from  37% to <32 % in order to urinate with more control and improve QOL    Time  6    Period  Weeks    Status  New    Target Date  02/23/18            Plan - 01/18/18 1650    Clinical Impression Statement  Patrick [name] demo'd increased diaphragmatic excursion with increased depression of ribs post Tx.  Patrick [name] required manual Tx which he tolerated without complaints. Patrick [name] demo'd increased hip mobility post Tx. Patrick [name] is progressing towards oblique strengthening to minimize diastasis recti.  Patrick [name] required excessive cues to gain propioception and trunk/pelvic dissassociation. Patrick [name] continues to benefit from skilled Patrick [name].      Rehab Potential  Good    Patrick [name] Frequency  1x / week    Patrick [name] Duration  12 weeks    Patrick [name] Treatment/Interventions  Therapeutic activities;Moist Heat;Therapeutic exercise;Balance training;Neuromuscular re-education;Manual techniques;Patient/family education;Energy conservation    Consulted and Agree with Plan of Care  Patient       Patient will benefit from skilled therapeutic intervention in order to improve the following deficits and impairments:  Increased muscle spasms, Improper body mechanics, Decreased strength, Decreased mobility, Postural dysfunction, Decreased endurance, Decreased coordination, Decreased range of motion  Visit Diagnosis: Other lack of coordination  Other symptoms and signs involving the musculoskeletal system     Problem List Patient  Active Problem List   Diagnosis Date Noted  . CAD in native artery 08/06/2017  . Essential hypertension 08/06/2017  . Hypokalemia 08/06/2017  . ST elevation myocardial infarction involving left anterior descending (LAD) coronary artery (HCC)   . Type 2 diabetes mellitus with complication, without long-term current use of insulin (HCC)   . Hyperlipidemia LDL goal <70     Patrick [name],Patrick [name] ,Patrick [name], Patrick [name], Patrick [name]  01/18/2018, 4:53 PM  [facility] MAIN [facility] [address] [place], [place], [address] Phone: [phone]   Fax:  [phone]  Name: Patrick [name] MRN: [id number] Date of Birth: 08/12/1956

## 2018-01-20 NOTE — Telephone Encounter (Signed)
Reviewed with Dr. Tresa EndoKelly.   Ok to hold off on starting Lovaza based on updated lab results.   Will make patient aware.

## 2018-01-20 NOTE — Telephone Encounter (Signed)
Spoke to patient, aware of Dr. Tresa EndoKelly recommendations and verbalized understanding.

## 2018-01-27 ENCOUNTER — Ambulatory Visit: Payer: 59 | Attending: Urology | Admitting: Physical Therapy

## 2018-01-27 DIAGNOSIS — R29898 Other symptoms and signs involving the musculoskeletal system: Secondary | ICD-10-CM

## 2018-01-27 DIAGNOSIS — R278 Other lack of coordination: Secondary | ICD-10-CM | POA: Diagnosis present

## 2018-01-27 NOTE — Patient Instructions (Signed)
Apply breathing at ribcage and pelvic floor when standing at urinal  to completely urinate  ______________   Patrick BottomsURGE SUPPRESION TECHNIQUES  ? These techniques are to be used to suppress those abnormally strong URGES to urinate, especially after you have already urinated < 1hr ago.  ? These steps do not have to be followed in order, and not all steps have to be used.   ? The purpose of these steps is to help you regain control of your bladder, to reduce the amount of urinary urgency, frequency, or leaking.  ? They take practice to master in controlling urgency.  Allow yourself to be okay with leaking when you first start practicing these steps. ? Practice these steps first at home, when you do not have to worry as much about leaking.  1. SIT DOWN.  Pressure on the pelvic floor inhibits the bladder.  Further pressure may help, such as sitting on a small rolled up towel. 2. LIGHT "KEGELS" using elevator imagery.  Breathe in, feel pelvic floor lower to "basement" level by the end of the inhalation. Exhale, feel pelvic floor gradually move up to "1st floor" which is the neutral position of pelvic floor. At the end of exhalation, feel pelvic floor lift higher at which you will perform a quick light squeeze (the muscles that hold back gas and urine).  Do not do hard, maximal contractions, as this will quickly fatigue the muscles and cause leakage. Perform this 5 repetitions in this pattern.  3. BREATHE & STAY CALM.  Breathing slowly and remaining calm will inhibit your sympathetic nervous system, which will in turn calm the bladder.   4. DISTRACTION.  Sit with a project that will engage your mind.  Anything that works for you - reading, word puzzles, crochet, knitting, checking email, balancing the checkbook, and so on. 5. VISUALIZATION.  Imagine that you are in a place/situation in which either you cannot or do not want to leave.  Examples: in a car and cannot stop; lying on a beach with a far walk to a  restroom; at dinner with someone special.  If the urge persists after practicing these steps and feel you must go to the bathroom, then it is imperative that you . . . . ? Walk slowly and calmly to the bathroom ? Maintain calm breathing ? Refrain from undressing until you are standing over the toilet Rushing to the restroom will only encourage the strong bladder urges and leaking.  Again, the more you practice, the easier these steps will become.   __________  Proper sitting posture, feet under knees on floor

## 2018-01-28 NOTE — Therapy (Signed)
[facility] MAIN [facility] SERVICES [address] [place], [place], [address] Phone: [phone]   Fax:  [phone]  Physical Therapy Treatment  Patient Details  Name: Patrick [name] MRN: [id number] Date of Birth: 19-Jun-1956 Referring Provider: Apolinar Junes   Encounter Date: 01/27/2018  PT End of Session - 01/28/18 1048    Visit Number  3    Date for PT Re-Evaluation  04/06/18    PT Start Time  0905    PT Stop Time  1000    PT Time Calculation (min)  55 min    Activity Tolerance  Patient tolerated treatment well;No increased pain    Behavior During Therapy  WFL for tasks assessed/performed       Past Medical History:  Diagnosis Date  . CAD in native artery    a. anterolateral STEMI 07/2017 s/p DES to prox-mid LAD, EF 55%.  Marland Kitchen GERD (gastroesophageal reflux disease)   . Hyperlipidemia   . Hypertension   . Overactive bladder   . Renal calculi   . Uncontrolled diabetes mellitus (HCC)     Past Surgical History:  Procedure Laterality Date  . CORONARY/GRAFT ACUTE MI REVASCULARIZATION N/A 08/04/2017   Procedure: Coronary/Graft Acute MI Revascularization;  Surgeon: Lennette Bihari, MD;  Location: [facility] INVASIVE CV LAB;  Service: Cardiovascular;  Laterality: N/A;  . LEFT HEART CATH AND CORONARY ANGIOGRAPHY N/A 08/04/2017   Procedure: LEFT HEART CATH AND CORONARY ANGIOGRAPHY;  Surgeon: Lennette Bihari, MD;  Location: [facility] INVASIVE CV LAB;  Service: Cardiovascular;  Laterality: N/A;  . PROSTATE SURGERY      There were no vitals filed for this visit.  Subjective Assessment - 01/27/18 0953    Subjective  Pt reported he was up since 3:30 am this morning after getting up at night  but was not able to go back to sleep. Pt states he completed his bladder diary. Pt became conscious he is lengthening his time between going to the bathroom.     Pertinent History  Pt has undergone thermal treatment to shrink prostate 7 years ago.  Sedentatry at work.  Pt walks 3.5 miles  across 5-6 days a week. Heart attack 07/2017. 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 This was the schedule for 5-6 days per week for 7 years.     Patient Stated Goals  reduce the number of times going to the toilet daily          [facility] PT Assessment - 01/27/18 0959      Observation/Other Assessments   Observations  leg crossed  ( cued for proper technique with explanation) .  Bladder diary:  showed frequent urination with 15-min intervals 1x day across 4 days,  3-4x trips to the uriante during the night 2-3 days / 4 days, during the day frequency occured 60-90 min between across 1-2x / day        Palpation   Spinal mobility  improved diaphragmatic excursion                Pelvic Floor Special Questions - 01/28/18 1047    Diastasis Recti  1.5 finger below sternum     External Perineal Exam  pt demo'd proper pelvic ROM and ability to contract pelvic floor 1 sec quick contraction. initially required cues to minimize assessory mm overuse of adductors and gluts         [facility] Adult PT Treatment/Exercise -  01/28/18 1043      Neuro Re-ed    Neuro Re-ed Details   see pt instructions             PT Education - 01/28/18 1048    Education provided  Yes    Education Details  HEP    Person(s) Educated  Patient    Methods  Explanation;Demonstration;Tactile cues;Verbal cues;Handout    Comprehension  Returned demonstration;Verbalized understanding;Verbal cues required;Tactile cues required          PT Long Term Goals - 01/12/18 1527      PT LONG TERM GOAL #1   Title  Pt will report decreased nocturia from 3-4 x night to < 2-3 x night in order to improve sleep     Time  12    Period  Weeks    Status  New    Target Date  04/06/18      PT LONG TERM GOAL #2   Title  Pt will report decreased urinary frequency of 2-3 x to 1-2 x  during an action movie in order to enjoy his hobby    Time  10    Period  Weeks     Status  New    Target Date  03/23/18      PT LONG TERM GOAL #3   Title  Pt will notice no urge to urinate when standing up after a meeting in order to work effectively    Time  8    Period  Weeks    Status  New    Target Date  03/09/18      PT LONG TERM GOAL #4   Title  Pt will decrease his ZUNG anxiety score from  30% to < 25 % in order to decrease urinary urgency and frequency    Time  12    Period  Weeks    Status  New    Target Date  04/06/18      PT LONG TERM GOAL #5   Title  Pt will report improved bladder irritant to water ratio in order to promote bladder health    Time  2    Period  Weeks    Status  New    Target Date  01/26/18      Additional Long Term Goals   Additional Long Term Goals  Yes      PT LONG TERM GOAL #6   Title  Pt will decrease his NIH-CPSI score from  37% to <32 % in order to urinate with more control and improve QOL    Time  6    Period  Weeks    Status  New    Target Date  02/23/18            Plan - 01/28/18 1049    Clinical Impression Statement  Pt showed decreased abdominal separation, improved deep core strength, increased diaphragmatic excursion and depression which indicate the improvement of a more efficient intraabdominal pressure system. Pt demo'd proper ROM of pelvic floor and quick contraction of mm which indicated he is ready to apply the urge suppression technique without risks for overactivity of pelvic floor mm. Pt demo'd IND with urge suppression technique. Pt also demo'd proper diaphragmatic excursion in upright position with education to apply this coordination when urinating for complete urination.Discussed pt's bladder diary which showed frequent nighttime voiding and several episodes of frequent urination within 15 min duration.  Pt was explained Elige RadonBradley loop and neurophysiology of urgency. Pt  was encouraged to utilize urge suppression technique and to slowly entrain urination to once every 90 min and eventually once every  2 hours.  Pt continues to benefit from skilled PT.    Rehab Potential  Good    PT Frequency  1x / week    PT Duration  12 weeks    PT Treatment/Interventions  Therapeutic activities;Moist Heat;Therapeutic exercise;Balance training;Neuromuscular re-education;Manual techniques;Patient/family education;Energy conservation    Consulted and Agree with Plan of Care  Patient       Patient will benefit from skilled therapeutic intervention in order to improve the following deficits and impairments:  Increased muscle spasms, Improper body mechanics, Decreased strength, Decreased mobility, Postural dysfunction, Decreased endurance, Decreased coordination, Decreased range of motion  Visit Diagnosis: Other lack of coordination  Other symptoms and signs involving the musculoskeletal system     Problem List Patient Active Problem List   Diagnosis Date Noted  . CAD in native artery 08/06/2017  . Essential hypertension 08/06/2017  . Hypokalemia 08/06/2017  . ST elevation myocardial infarction involving left anterior descending (LAD) coronary artery (HCC)   . Type 2 diabetes mellitus with complication, without long-term current use of insulin (HCC)   . Hyperlipidemia LDL goal <70     Mariane Masters ,PT, DPT, E-RYT   01/28/2018, 1:52 PM  [facility] MAIN [facility] SERVICES [address] [place], [place], [address] Phone: [phone]   Fax:  [phone]  Name: Patrick [name] MRN: [id number] Date of Birth: [birth year]

## 2018-02-01 ENCOUNTER — Ambulatory Visit: Payer: 59 | Admitting: Physical Therapy

## 2018-02-01 DIAGNOSIS — R29898 Other symptoms and signs involving the musculoskeletal system: Secondary | ICD-10-CM

## 2018-02-01 DIAGNOSIS — R278 Other lack of coordination: Secondary | ICD-10-CM

## 2018-02-01 NOTE — Therapy (Signed)
[place] MAIN [facility] SERVICES [address] [place], [place], [address] Phone: [phone]   Fax:  [phone]  Physical Therapy Treatment  Patient Details  Name: Patrick [name] MRN: [id number] Date of Birth: 1956/06/14 Referring Provider: Erlene Quan   Encounter Date: 02/01/2018  PT End of Session - 02/01/18 0829    Visit Number  4    Date for PT Re-Evaluation  04/06/18    PT Start Time  0810    PT Stop Time  0845    PT Time Calculation (min)  35 min    Activity Tolerance  Patient tolerated treatment well;No increased pain    Behavior During Therapy  WFL for tasks assessed/performed       Past Medical History:  Diagnosis Date  . CAD in native artery    a. anterolateral STEMI 07/2017 s/p DES to prox-mid LAD, EF 55%.  Marland Kitchen GERD (gastroesophageal reflux disease)   . Hyperlipidemia   . Hypertension   . Overactive bladder   . Renal calculi   . Uncontrolled diabetes mellitus ([place])     Past Surgical History:  Procedure Laterality Date  . CORONARY/GRAFT ACUTE MI REVASCULARIZATION N/A 08/04/2017   Procedure: Coronary/Graft Acute MI Revascularization;  Surgeon: Troy Sine, MD;  Location: [place] CV LAB;  Service: Cardiovascular;  Laterality: N/A;  . LEFT HEART CATH AND CORONARY ANGIOGRAPHY N/A 08/04/2017   Procedure: LEFT HEART CATH AND CORONARY ANGIOGRAPHY;  Surgeon: Troy Sine, MD;  Location: [place] CV LAB;  Service: Cardiovascular;  Laterality: N/A;  . PROSTATE SURGERY      There were no vitals filed for this visit.  Subjective Assessment - 02/01/18 0813    Subjective  Pt find that the urge suppression technique sometimes works and does not work     Pertinent History  Pt has undergone thermal treatment to shrink prostate 7 years ago.  Sedentatry at work.  Pt walks 3.5 miles across 5-6 days a week. Heart attack 07/2017. 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 This  was the schedule for 5-6 days per week for 7 years.     Patient Stated Goals  reduce the number of times going to the toilet daily          [facility] PT Assessment - 02/02/18 1543      Observation/Other Assessments   Observations  self-corrected with feet on ground, did not cross ankles                  [facility] Adult PT Treatment/Exercise - 02/02/18 1543      Neuro Re-ed    Neuro Re-ed Details   see pt instructions; body scan and physiology of nervous system           Educated pt: _ the importance of retraining the brain for pain management _the role of autonomic nervous system _use of diaphragmatic breathing and mindfulness technique re-balance a hypersensitive nervous system.   Guided pt through body scan 1 cycle, gradual transition from relaxed state to wakeful state. Pt practiced 2 more  cycle IND. Pt reported feeling relaxed after practice.         PT Long Term Goals - 02/02/18 1543      PT LONG TERM GOAL #1   Title  Pt will report decreased nocturia from 3-4 x night to < 2-3 x night in order to improve sleep  Time  12    Period  Weeks    Status  On-going      PT LONG TERM GOAL #2   Title  Pt will report decreased urinary frequency of 2-3 x to 1-2 x  during an action movie in order to enjoy his hobby    Time  10    Period  Weeks    Status  Partially Met      PT LONG TERM GOAL #3   Title  Pt will notice no urge to urinate when standing up after a meeting in order to work effectively    Time  8    Period  Weeks    Status  On-going      PT LONG TERM GOAL #4   Title  Pt will decrease his ZUNG anxiety score from  30% to < 25 % in order to decrease urinary urgency and frequency    Time  12    Period  Weeks    Status  On-going      PT LONG TERM GOAL #5   Title  Pt will report improved bladder irritant to water ratio in order to promote bladder health    Time  2    Period  Weeks    Status  On-going      PT LONG TERM GOAL #6   Title  Pt will  decrease his NIH-CPSI score from  37% to <32 % in order to urinate with more control and improve QOL    Time  6    Period  Weeks    Status  On-going            Plan - 02/01/18 0830    Clinical Impression Statement  Pt  was educated on downregulation of nervous system to better manage overactivity of pelvic floor mm. Pt responded well to body scan technique and voiced feeling more relaxed following training. Pt noticed it was more difficulty to observe around the pelvic area initially. Plan to communicate with PCP or cardiologist about referral for sleep study due to risks for OSA and the positive outcome with nocturia if associated with OSA. Pt was provided information about associated risk factors for OSA and importance of this screening given his medical Hx of heart attack last year and other comorbidities.   Citations: Raheem O et al. Clinical predictors of nocturia in the sleep apnea population. Urology Annals. 2014. 6 (1): 31-35.).      Kalman Drape al.  Sleep Apnea and Cardiovascular Disease. Journal of the [employer]-3 [employer] of Cardiology. 2008. 52(8).    Pt continues to benefit from skilled PT.    Rehab Potential  Good    PT Frequency  1x / week    PT Duration  12 weeks    PT Treatment/Interventions  Therapeutic activities;Moist Heat;Therapeutic exercise;Balance training;Neuromuscular re-education;Manual techniques;Patient/family education;Energy conservation    Consulted and Agree with Plan of Care  Patient       Patient will benefit from skilled therapeutic intervention in order to improve the following deficits and impairments:  Increased muscle spasms, Improper body mechanics, Decreased strength, Decreased mobility, Postural dysfunction, Decreased endurance, Decreased coordination, Decreased range of motion  Visit Diagnosis: Other lack of coordination  Other symptoms and signs involving the musculoskeletal system     Problem List Patient Active Problem List    Diagnosis Date Noted  . CAD in native artery 08/06/2017  . Essential hypertension 08/06/2017  . Hypokalemia 08/06/2017  . ST elevation  myocardial infarction involving left anterior descending (LAD) coronary artery ([place])   . Type 2 diabetes mellitus with complication, without long-term current use of insulin ([place])   . Hyperlipidemia LDL goal <70     Jerl Mina ,PT, DPT, E-RYT  02/02/2018, 3:44 PM  [place] MAIN [facility] SERVICES [address] [place], [place], [address] Phone: [phone]   Fax:  [phone]  Name: KODA ROUTON MRN: [id number] Date of Birth: 11-16-1956

## 2018-02-01 NOTE — Patient Instructions (Addendum)
Body scan audio emailed along with research articles  Daily  __________  In seated position at work  [employer] prior to the meeting that you will be able to get up without tensing the body   Use any of the following when feeling anxious:   Practice "take 5"  1-2 pause on inhale  3-2-1 pause on exhale    Or the thumb to fingers technique

## 2018-02-07 ENCOUNTER — Ambulatory Visit: Payer: 59 | Admitting: Physical Therapy

## 2018-02-08 ENCOUNTER — Telehealth: Payer: Self-pay | Admitting: Urology

## 2018-02-08 NOTE — Telephone Encounter (Signed)
Please call in test dose.  I normally have Carollee HerterShannon do these.  Please arrange f/u with her for this specifically.    Vanna ScotlandAshley Porshia Blizzard, MD

## 2018-02-08 NOTE — Telephone Encounter (Signed)
Patient called and wants to proceed with the trimix injections. He moved his app to 03-01-18 and wants to know if we can call in the test dose so that he can do that on the same day?  Please advise  Patrick DusterMichelle

## 2018-02-09 ENCOUNTER — Encounter: Payer: 59 | Admitting: Physical Therapy

## 2018-02-09 NOTE — Telephone Encounter (Signed)
Spoke with Patrick [name] in reference to trimix teaching appt and f/u with Dr. Apolinar JunesBrandon. [facility] and then a second appt later on with Dr. Apolinar JunesBrandon. Patrick [name] voiced understanding of whole conversation.

## 2018-02-15 ENCOUNTER — Ambulatory Visit: Payer: 59 | Admitting: Physical Therapy

## 2018-02-15 DIAGNOSIS — R278 Other lack of coordination: Secondary | ICD-10-CM

## 2018-02-15 DIAGNOSIS — R29898 Other symptoms and signs involving the musculoskeletal system: Secondary | ICD-10-CM

## 2018-02-15 NOTE — Patient Instructions (Addendum)
Replace Deep core level 2 (knee out)  To Level 3 ( lifting of foot, and placing all four corners of foot down)  6 min    Add hip strengthening:   Clam Shell 45 Degrees   Lying with hips and knees bent 45, one pillow between knees and ankles. Lift knee with exhale. Be sure pelvis does not roll backward. Do not arch back. Do 10 times x 2  each leg, 2 times per day.  http://ss.exer.us/75   Copyright  VHI. All rights reserved.    _______   Royetta CarSidestepping with half a foot and then feet under hips, mini squat where knees are behind toes, hands at hip , elbows back 1 hallway  Left, and then right  3 laps    _______  STRETCH:  piriformis / figure -4   5 breaths

## 2018-02-16 NOTE — Therapy (Signed)
[name] MAIN [facility] SERVICES [address] [place], [place], [address] Phone: [phone]   Fax:  [phone]  Physical Therapy Treatment / Progress Note  Patient Details  Name: Patrick [name] MRN: [id number] Date of Birth: 1956/05/18 Referring Provider: Erlene Quan   Encounter Date: 02/15/2018  PT End of Session - 02/15/18 0915    Visit Number  5    Date for PT Re-Evaluation  04/06/18    PT Start Time  0805    PT Stop Time  0910    PT Time Calculation (min)  65 min    Activity Tolerance  Patient tolerated treatment well;No increased pain    Behavior During Therapy  WFL for tasks assessed/performed       Past Medical History:  Diagnosis Date  . CAD in native artery    a. anterolateral STEMI 07/2017 s/p DES to prox-mid LAD, EF 55%.  Marland Kitchen GERD (gastroesophageal reflux disease)   . Hyperlipidemia   . Hypertension   . Overactive bladder   . Renal calculi   . Uncontrolled diabetes mellitus ([place])     Past Surgical History:  Procedure Laterality Date  . CORONARY/GRAFT ACUTE MI REVASCULARIZATION N/A 08/04/2017   Procedure: Coronary/Graft Acute MI Revascularization;  Surgeon: Troy Sine, MD;  Location: [place] CV LAB;  Service: Cardiovascular;  Laterality: N/A;  . LEFT HEART CATH AND CORONARY ANGIOGRAPHY N/A 08/04/2017   Procedure: LEFT HEART CATH AND CORONARY ANGIOGRAPHY;  Surgeon: Troy Sine, MD;  Location: [place] CV LAB;  Service: Cardiovascular;  Laterality: N/A;  . PROSTATE SURGERY      There were no vitals filed for this visit.  Subjective Assessment - 02/15/18 0810    Subjective  Pt reports his night time trips to the toilet has been improving but not consistent yet. He had slept contimuously without interruptions for 2-3 nights across 1 week instead of 0 nights.     Pertinent History  Pt has undergone thermal treatment to shrink prostate 7 years ago.  Sedentatry at work.  Pt walks 3.5 miles across 5-6 days a week. Heart  attack 07/2017. 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 This was the schedule for 5-6 days per week for 7 years.     Patient Stated Goals  reduce the number of times going to the toilet daily          [facility] PT Assessment - 02/16/18 1353      Observation/Other Assessments   Observations  piriformis stretch without compensation in higher seat due to tightness       Strength   Overall Strength Comments  hip ext inprone 3+/5 B, hip abd 3+/5 B                   [facility] Adult PT Treatment/Exercise - 02/16/18 1353      Therapeutic Activites    Therapeutic Activities  -- see pt instructions,discussed POC, wellness,reassessed goals      Neuro Re-ed    Neuro Re-ed Details   see pt instructions; body scan and physiology of nervous system             PT Education - 02/15/18 0900    Education provided  Yes    Education Details  reassessed goals     Person(s) Educated  Patient    Methods  Explanation;Demonstration;Tactile cues;Verbal cues;Handout    Comprehension  Returned demonstration;Verbalized  understanding;Verbal cues required;Tactile cues required          PT Long Term Goals - 02/15/18 0815      PT LONG TERM GOAL #1   Title  Pt will report decreased nocturia from 3-4 x night to < 2-3 x night in order to improve sleep     Time  12    Period  Weeks    Status  Achieved      PT LONG TERM GOAL #2   Title  Pt will report decreased urinary frequency of 2-3 x/hr  to 1-2 x / hr during an action movie in order to enjoy his hobby    Time  10    Period  Weeks    Status  Achieved      PT LONG TERM GOAL #3   Title  Pt will notice no urge to urinate when standing up after a meeting in order to work effectively    Time  8    Period  Weeks    Status  Partially Met      PT LONG TERM GOAL #4   Title  Pt will decrease his ZUNG anxiety score from  30% to < 25 % in order to decrease urinary urgency and  frequency  (2/26: 29%)     Time  12    Period  Weeks    Status  Partially Met      PT LONG TERM GOAL #5   Title  Pt will report improved bladder irritant to water ratio in order to promote bladder health    Time  2    Period  Weeks    Status  Achieved      Additional Long Term Goals   Additional Long Term Goals  Yes      PT LONG TERM GOAL #6   Title  Pt will decrease his NIH-CPSI score from  37% to <32 % in order to urinate with more control and improve QOL (2/26: 30%)     Time  6    Period  Weeks    Status  Achieved      PT LONG TERM GOAL #7   Title  Pt will demo increased hip strength for hip ext and hip abduction B from 3+/5 to 4/5  and demo IND with  pre-postwalking stretches in order to maintain wellness and prevent injuries    Time  8    Period  Weeks    Status  New    Target Date  04/12/18            Plan - 02/15/18 0915    Clinical Impression Statement  Pt has achieved 4/7 goals and reports decreasing nocturia, urinary frequency and urgency.  Pt demonstrates an improved deep core system with resolved diastasis recti and increased hip/SIJ  mobility.  Pt continues to progress towards his remaining goals. Initiated global mm strengthening /flexibility for hips today as pt demonstrated hip weakness and will require further rehab in this area to yield longer lasting changes to urinary function as he returns to walking for aerobic activity. Given the fact that pt had a heart attack last year, pt was provided education on maintaining health and wealth. Discussed his health/ fitness goals and educated pt about cardiac rehab program post d/c.  Anticipate pt will benefit from skilled PT for a few more visits before d/c.     Rehab Potential  Good    PT Frequency  1x / week  PT Duration  12 weeks    PT Treatment/Interventions  Therapeutic activities;Moist Heat;Therapeutic exercise;Balance training;Neuromuscular re-education;Manual techniques;Patient/family education;Energy  conservation    Consulted and Agree with Plan of Care  Patient       Patient will benefit from skilled therapeutic intervention in order to improve the following deficits and impairments:  Increased muscle spasms, Improper body mechanics, Decreased strength, Decreased mobility, Postural dysfunction, Decreased endurance, Decreased coordination, Decreased range of motion  Visit Diagnosis: Other lack of coordination  Other symptoms and signs involving the musculoskeletal system     Problem List Patient Active Problem List   Diagnosis Date Noted  . CAD in native artery 08/06/2017  . Essential hypertension 08/06/2017  . Hypokalemia 08/06/2017  . ST elevation myocardial infarction involving left anterior descending (LAD) coronary artery ([place])   . Type 2 diabetes mellitus with complication, without long-term current use of insulin ([place])   . Hyperlipidemia LDL goal <70     Jerl Mina ,PT, DPT, E-RYT  02/16/2018, 1:58 PM  [place] MAIN [facility] SERVICES [address] [place], [place], [address] Phone: [phone]   Fax:  [phone]  Name: AYYAN SITES MRN: [id number] Date of Birth: 05-13-1956

## 2018-02-21 ENCOUNTER — Ambulatory Visit: Payer: 59 | Admitting: Physical Therapy

## 2018-02-23 ENCOUNTER — Other Ambulatory Visit: Payer: Self-pay | Admitting: Physician Assistant

## 2018-02-23 DIAGNOSIS — E782 Mixed hyperlipidemia: Secondary | ICD-10-CM

## 2018-02-23 NOTE — Progress Notes (Signed)
Cardiology Office Note   Date:  02/28/2018   ID:  Patrick [name], DOB August 21, 1956, MRN [id number]  PCP:  Lauro Regulus, MD  Cardiologist:  Dr. Tresa Endo  Chief Complaint  Patient presents with  . Chest Pain    heartburn     History of Present Illness: Patrick [name] is a 62 y.o. male who presents for ongoing assessment and management of coronary artery disease, the patient had cardiac catheterization on 2018 which revealed a long segment of occlusion in his LAD after the first diagonal vessel, requiring DES. Other history includes hypercholesterolemia with most recent LDL of 47 on fenofibrate and statin therapy.   Only symptoms on last office visit 11/16/2017 with vague episodes of angina after working 16 hours a day for 3 days and having to travel on frequent business trips.  On last office visit with Dr. Rennis Golden noted that his blood pressure was mildly elevated. His metoprolol was increased to 37.5 mg in the morning and continue to 25 mg evening. He was unchanged revealing normal sinus rhythm with right bundle branch block.   He continues to complain of afternoon heartburn, usually around 3 pm. He also has some with mild exertion. He denies dyspnea or dizziness. He is medically complaint.  He sometimes takes Pepcid in the afternoon.   Past Medical History:  Diagnosis Date  . CAD in native artery    a. anterolateral STEMI 07/2017 s/p DES to prox-mid LAD, EF 55%.  Marland Kitchen GERD (gastroesophageal reflux disease)   . Hyperlipidemia   . Hypertension   . Overactive bladder   . Renal calculi   . Uncontrolled diabetes mellitus (HCC)     Past Surgical History:  Procedure Laterality Date  . CORONARY/GRAFT ACUTE MI REVASCULARIZATION N/A 08/04/2017   Procedure: Coronary/Graft Acute MI Revascularization;  Surgeon: Lennette Bihari, MD;  Location: [facility] INVASIVE CV LAB;  Service: Cardiovascular;  Laterality: N/A;  . LEFT HEART CATH AND CORONARY ANGIOGRAPHY N/A 08/04/2017   Procedure: LEFT HEART CATH AND  CORONARY ANGIOGRAPHY;  Surgeon: Lennette Bihari, MD;  Location: [facility] INVASIVE CV LAB;  Service: Cardiovascular;  Laterality: N/A;  . PROSTATE SURGERY       Current Outpatient Medications  Medication Sig Dispense Refill  . aspirin 81 MG tablet Take 1 tablet (81 mg total) by mouth daily. 90 tablet 3  . atorvastatin (LIPITOR) 40 MG tablet TAKE 1 TABLET(40 MG) BY MOUTH EVERY EVENING 30 tablet 3  . Coenzyme Q10 200 MG capsule Take 200 mg by mouth daily.    Marland Kitchen glimepiride (AMARYL) 2 MG tablet Take 2 mg by mouth daily with breakfast.    . Glucosamine-Chondroit-Vit C-Mn (GLUCOSAMINE 1500 COMPLEX PO) Take 1,500 mg by mouth daily.    Marland Kitchen losartan (COZAAR) 100 MG tablet TAKE 1 TABLET(100 MG) BY MOUTH DAILY 30 tablet 3  . Magnesium 250 MG TABS Take 250 mg by mouth daily.    . Magnesium Citrate 100 MG TABS Take 100 mg by mouth daily.    . metformin (FORTAMET) 500 MG (OSM) 24 hr tablet Take 2,000 mg by mouth 2 (two) times daily with a meal.     . metoprolol tartrate (LOPRESSOR) 25 MG tablet Take 37.5 mg (1 1/2 tablets) in the morning and 25 mg (1 tablets) at night 225 tablet 3  . Multiple Vitamins-Minerals (ABC PLUS SENIOR PO) Take 1 tablet by mouth daily.    . nitroGLYCERIN (NITROSTAT) 0.4 MG SL tablet Place 1 tablet (0.4 mg total) under the tongue every 5 (  five) minutes x 3 doses as needed for chest pain. 25 tablet 3  . Omega-3 Fatty Acids (FISH OIL) 1200 MG CAPS Take 1,200 mg by mouth daily.    . pantoprazole (PROTONIX) 40 MG tablet Take 40 mg by mouth daily.    . Potassium 99 MG TABS Take 99 mg by mouth daily.    Marland Kitchen pyridOXINE (VITAMIN B-6) 50 MG tablet Take 50 mg by mouth daily.    . ticagrelor (BRILINTA) 90 MG TABS tablet Take 1 tablet (90 mg total) by mouth 2 (two) times daily. 60 tablet 11  . tolterodine (DETROL LA) 4 MG 24 hr capsule Take 1 capsule (4 mg total) by mouth daily. 30 capsule 11  . vitamin C (ASCORBIC ACID) 500 MG tablet Take 500 mg by mouth daily.    . vitamin E 400 UNIT capsule Take 400  Units by mouth daily.     No current facility-administered medications for this visit.     Allergies:   Patient has no known allergies.    Social History:  The patient  reports that  has never smoked. he has never used smokeless tobacco. He reports that he does not drink alcohol or use drugs.   Family History:  The patient's family history includes Heart attack in his father; Heart disease in his father.    ROS: All other systems are reviewed and negative. Unless otherwise mentioned in H&P    PHYSICAL EXAM: VS:  BP (!) 166/92   Pulse 64   Ht 6' (1.829 m)   Wt 237 lb (107.5 kg)   BMI 32.14 kg/m  , BMI Body mass index is 32.14 kg/m. GEN: Well nourished, well developed, in no acute distress  HEENT: normal  Neck: no JVD, carotid bruits, or masses Cardiac: RRR; no murmurs, rubs, or gallops,no edema  Respiratory:  clear to auscultation bilaterally, normal work of breathing GI: soft, nontender, nondistended, + BS, mild upper quadrant pain with palpation but no true Murphy;s sign. Obese MS: no deformity or atrophy  Skin: warm and dry, no rash Neuro:  Strength and sensation are intact Psych: euthymic mood, full affect   EKG: NSR rate of 64 bpm. RBBB.    Recent Labs: 08/04/2017: ALT 55 08/05/2017: BUN 17; Creatinine, Ser 1.16; Hemoglobin 13.5; Platelets 218; Potassium 3.4; Sodium 138    Lipid Panel    Component Value Date/Time   CHOL 204 (H) 08/04/2017 1005   TRIG 370 (H) 08/04/2017 1005   HDL 29 (L) 08/04/2017 1005   CHOLHDL 7.0 08/04/2017 1005   VLDL 74 (H) 08/04/2017 1005   LDLCALC 101 (H) 08/04/2017 1005      Wt Readings from Last 3 Encounters:  02/28/18 237 lb (107.5 kg)  01/04/18 228 lb (103.4 kg)  11/23/17 232 lb 6.4 oz (105.4 kg)      Other studies Reviewed: Cardiac Cath: 08/04/2017    LV end diastolic pressure is mildly elevated.  The left ventricular systolic function is normal.  A STENT RESOLUTE [id number] drug eluting stent was successfully  placed.  Prox LAD to Mid LAD lesion, 100 %stenosed.  Post intervention, there is a 0% residual stenosis.   Acute ST segment elevation myocardial infarction secondary to total occlusion of the LAD immediately after the takeoff of the first diagonal and septal perforating artery.  There was TIMI 0 flow.  Mild luminal irregularity of the left circumflex vessel without significant obstructive stenoses.  Dominant normal RCA.  Successful PCI to the totally occluded LAD which resulted in a  long segment of occlusion and ultimately treated with PTCA and DES stenting with a Resolute onyx 3.038 mm stent postdilated to 3.25 mm with 100% occlusion being reduced to 0% and TIMI 0 flow being improved to TIMI 3 flow.     ASSESSMENT AND PLAN:  1.  CAD: Cardiac cath in 07/2017 requiring long segment LAD with stenting. He is having worsening heart burn symptoms and fatigue noted after his is tired in the afternoon.Took NTG and symptoms went away. I will repeat stress test, don't think he needs cath at this time unless abnormal NM scintigraphy.   2. GERD: Symptoms appear more GI in etiology. I have asked him to continue PPI, and begin taking Pepcid in the afternoon daily. He does have a GI specialist, but has not seen in a while. Will likely need to send him back to GI for further testing.   3.Hypertension:  He does not take his BP at home. I have asked him to do this. He has been hypertensive on prior office visits, and medications have been adjusted. No improvement. If stress test is ok, may go up on antihypertensive therapy.    Current medicines are reviewed at length with the patient today.    Labs/ tests ordered today include: NM Stress Test Bettey MareKathryn M. Liborio NixonLawrence DNP, ANP, AACC   02/28/2018 9:48 AM    [facility] [address], [place], [place] [address] Phone: [phone]; Fax: [phone]

## 2018-02-23 NOTE — Telephone Encounter (Signed)
Called patient to verify pharmacy while doing so patient stated that he is having chest pain/heart burn and that he wanted to know what to do since he is scheduled to see Dr. Tresa EndoKelly on the 27th of March. Spoke with Fabiola BackerMichelle Broome, LPN about what patient said about the chest pains/heart burn and she managed to get the patient scheduled to see Dr. Lyman BishopLawrence, NP on 02/28/2018 at 9:30 AM. Patient was also informed to go to the ER if symptoms/pains worsen. Patient verbalized the understanding.

## 2018-02-28 ENCOUNTER — Ambulatory Visit: Payer: 59 | Attending: Urology | Admitting: Physical Therapy

## 2018-02-28 ENCOUNTER — Encounter: Payer: Self-pay | Admitting: Adult Health

## 2018-02-28 ENCOUNTER — Ambulatory Visit (INDEPENDENT_AMBULATORY_CARE_PROVIDER_SITE_OTHER): Payer: 59 | Admitting: Adult Health

## 2018-02-28 VITALS — BP 166/92 | HR 64 | Ht 72.0 in | Wt 237.0 lb

## 2018-02-28 DIAGNOSIS — I10 Essential (primary) hypertension: Secondary | ICD-10-CM

## 2018-02-28 DIAGNOSIS — K219 Gastro-esophageal reflux disease without esophagitis: Secondary | ICD-10-CM | POA: Diagnosis not present

## 2018-02-28 DIAGNOSIS — I2102 ST elevation (STEMI) myocardial infarction involving left anterior descending coronary artery: Secondary | ICD-10-CM

## 2018-02-28 DIAGNOSIS — R29898 Other symptoms and signs involving the musculoskeletal system: Secondary | ICD-10-CM

## 2018-02-28 DIAGNOSIS — R278 Other lack of coordination: Secondary | ICD-10-CM | POA: Diagnosis present

## 2018-02-28 DIAGNOSIS — I2510 Atherosclerotic heart disease of native coronary artery without angina pectoris: Secondary | ICD-10-CM | POA: Diagnosis not present

## 2018-02-28 MED ORDER — FAMOTIDINE 40 MG PO TABS: 40.0000 mg | ORAL_TABLET | Freq: Every day | ORAL | 6 refills | Status: DC

## 2018-02-28 NOTE — Therapy (Signed)
[place] MAIN [facility] [address] [place], [place], [address] Phone: [phone]   Fax:  [phone]  Physical Therapy Treatment  Patient Details  Name: Patrick [name] MRN: [id number] Date of Birth: [birth year] Referring Provider: Erlene Quan   Encounter Date: 02/28/2018  PT End of Session - 02/28/18 0821    Visit Number  6    Date for PT Re-Evaluation  04/06/18    PT Start Time  0810    PT Stop Time  0848    PT Time Calculation (min)  38 min    Activity Tolerance  Patient tolerated treatment well;No increased pain    Behavior During Therapy  WFL for tasks assessed/performed       Past Medical History:  Diagnosis Date  . CAD in native artery    a. anterolateral STEMI 07/2017 s/p DES to prox-mid LAD, EF 55%.  Marland Kitchen GERD (gastroesophageal reflux disease)   . Hyperlipidemia   . Hypertension   . Overactive bladder   . Renal calculi   . Uncontrolled diabetes mellitus ([place])     Past Surgical History:  Procedure Laterality Date  . CORONARY/GRAFT ACUTE MI REVASCULARIZATION N/A 08/04/2017   Procedure: Coronary/Graft Acute MI Revascularization;  Surgeon: Troy Sine, MD;  Location: [facility] CV LAB;  Service: Cardiovascular;  Laterality: N/A;  . LEFT HEART CATH AND CORONARY ANGIOGRAPHY N/A 08/04/2017   Procedure: LEFT HEART CATH AND CORONARY ANGIOGRAPHY;  Surgeon: Troy Sine, MD;  Location: [facility] CV LAB;  Service: Cardiovascular;  Laterality: N/A;  . PROSTATE SURGERY      There were no vitals filed for this visit.  Subjective Assessment - 02/28/18 0815    Subjective  Pt reports the body scan mindfulness technique and urge suppression protocol has helped with decreasing urgency by 50%. Pt is noticing he has to go again after 5 minutes of emptying in the morning.     Pertinent History  Pt has undergone thermal treatment to shrink prostate 7 years ago.  Sedentatry at work.  Pt walks 3.5 miles across 5-6 days a week. Heart attack  07/2017. 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 This was the schedule for 5-6 days per week for 7 years.     Patient Stated Goals  reduce the number of times going to the toilet daily          [facility] PT Assessment - 02/28/18 8413      Strength   Overall Strength Comments  hip ext prone 4/5 B, hip abd 4-/5 L, 4/5 R                           PT Education - 02/28/18 0853    Education provided  Yes    Education Details  HEP    Person(s) Educated  Patient    Methods  Explanation;Demonstration;Tactile cues;Verbal cues;Handout    Comprehension  Returned demonstration;Verbalized understanding;Verbal cues required;Tactile cues required          PT Long Term Goals - 02/28/18 0816      PT LONG TERM GOAL #1   Title  Pt will report decreased nocturia from 3-4 x night to < 2-3 x night in order to improve sleep     Time  12    Period  Weeks    Status  Achieved      PT  LONG TERM GOAL #2   Title  Pt will report decreased urinary frequency of 2-3 x/hr  to 1-2 x / hr during an action movie in order to enjoy his hobby    Time  10    Period  Weeks    Status  Achieved      PT LONG TERM GOAL #3   Title  Pt will notice no urge to urinate when standing up after a meeting in order to work effectively    Time  8    Period  Weeks    Status  Partially Met      PT LONG TERM GOAL #4   Title  Pt will decrease his ZUNG anxiety score from  30% to < 25 % in order to decrease urinary urgency and frequency  (2/26: 29%)     Time  12    Period  Weeks    Status  Partially Met      PT LONG TERM GOAL #5   Title  Pt will report improved bladder irritant to water ratio in order to promote bladder health    Time  2    Period  Weeks    Status  Achieved      Additional Long Term Goals   Additional Long Term Goals  Yes      PT LONG TERM GOAL #6   Title  Pt will decrease his NIH-CPSI score from  37% to <32 % in order to  urinate with more control and improve QOL (2/26: 30%)     Time  6    Period  Weeks    Status  Achieved      PT LONG TERM GOAL #7   Title  Pt will demo increased hip strength for hip ext and hip abduction B from 3+/5 to 4/5  and demo IND with  pre-postwalking stretches in order to maintain wellness and prevent injuries    Time  8    Period  Weeks    Status  New      PT LONG TERM GOAL #8   Title  Pt will report complete emptying urine in the morning across 2 mornings in order to return perform ADLs in the morning     Time  4    Period  Weeks    Status  New    Target Date  03/28/18            Plan - 02/28/18 0853    Clinical Impression Statement  Pt is progressing well with increasing hip ext strength but remaining weakness on L with hip abduction. Pt was recommended to add another 15-20 min of aerobic activity into his day given his Hx of heart attack last year and sedentary work duties. Pt voiced motivation and compliance. Pt was also educated about use of deep core coordinaiton to promote complete urination. Pt voiced understanding. Pt states he is having 50% improvement with urgency with use of mindfulness techniques and urge suppression technique. Pt continues to benefit from skilled PT     Rehab Potential  Good    PT Frequency  1x / week    PT Duration  12 weeks    PT Treatment/Interventions  Therapeutic activities;Moist Heat;Therapeutic exercise;Balance training;Neuromuscular re-education;Manual techniques;Patient/family education;Energy conservation    Consulted and Agree with Plan of Care  Patient       Patient will benefit from skilled therapeutic intervention in order to improve the following deficits and impairments:  Increased muscle spasms, Improper  body mechanics, Decreased strength, Decreased mobility, Postural dysfunction, Decreased endurance, Decreased coordination, Decreased range of motion  Visit Diagnosis: Other lack of coordination  Other symptoms and signs  involving the musculoskeletal system     Problem List Patient Active Problem List   Diagnosis Date Noted  . CAD in native artery 08/06/2017  . Essential hypertension 08/06/2017  . Hypokalemia 08/06/2017  . ST elevation myocardial infarction involving left anterior descending (LAD) coronary artery ([place])   . Type 2 diabetes mellitus with complication, without long-term current use of insulin ([place])   . Hyperlipidemia LDL goal <70     Jerl Mina ,PT, DPT, E-RYT  02/28/2018, 8:56 AM  [place] MAIN [facility] SERVICES [address] [place], [place], [address] Phone: [phone]   Fax:  [phone]  Name: Patrick [name] MRN: [id number] Date of Birth: 16-Jan-1956

## 2018-02-28 NOTE — Patient Instructions (Addendum)
Medication Instructions:  MAKE SURE THAT YOU ARE TAKING ASPIRIN 81MG  (BABY ASPIRIN)  START PEPCID AFTER LUNCH DAILY; CONTINUE PROTONIX AT BEDTIME  If you need a refill on your cardiac medications before your next appointment, please call your pharmacy.  Testing/Procedures: Your physician has requested that you have a lexiscan myoview.A cardiac stress test is a cardiological test that measures the heart's ability to respond to external stress in a controlled clinical environment. The stress response is induced byintravenous pharmacological stimulation. For further information please visit [email or web address]. Please follow instruction sheet, as given. MAKE SURE TO HOLD METOPROLOL THE MORNING OF THE STRESS TEST.  Follow-Up: Your physician wants you to follow-up: MAKE SURE YOU KEEP SCHEDULED APPT WITH DR Tresa EndoKELLY.    Thank you for choosing [facility]!!      [facility] at [facility] [address], [address] [place], [place] [address] Phone: [phone]  February 28, 2018    Patrick [name] L Patrick [name] DOB: [birth year] MRN: [id number] [address] [place] [address]   Dear Patrick [name],  You are scheduled for a Myocardial Perfusion Imaging Study on:    at  .  Please arrive 15 minutes prior to your appointment time for registration and insurance purposes.  The test will take approximately 3 to 4 hours to complete; you may bring reading material.  If someone comes with you to your appointment, they will need to remain in the main lobby due to limited space in the testing area. **If you are pregnant or breastfeeding, please notify the nuclear lab prior to your appointment**  How to prepare for your Myocardial Perfusion Test: . Do not eat or drink 3 hours prior to your test, except you may have water. . Do not consume products containing caffeine (regular or decaffeinated) 12 hours prior to your test. (ex: coffee, chocolate, sodas, tea). Do bring a list of  your current medications with you.  If not listed below, you may take your medications as normal. . Do not take metoprolol (Lopressor, Toprol) for 24 hours prior to the test.  Bring the medication to your appointment as you may be required to take it once the test is complete. . Do wear comfortable clothes (no dresses or overalls) and walking shoes, tennis shoes preferred (No heels or open toe shoes are allowed). . Do NOT wear cologne, perfume, aftershave, or lotions (deodorant is allowed). . If these instructions are not followed, your test will have to be rescheduled.  Please report to [address], [address] for your test.  If you have questions or concerns about your appointment, you can call the Nuclear Lab at [phone].  If you cannot keep your appointment, please provide 24 hours notification to the Nuclear Lab, to avoid a possible $50 charge to your account.

## 2018-02-28 NOTE — Patient Instructions (Addendum)
Add 10-15 min of riding the recumbent bike after work while playing game on phone,    Pre/post walking and cycling stretches:   Hip flexor stretch by the wall   Wall stretch:  low lunge with feet flat   Reach higher with the hand on the asame side as  Your back foot Knees above ankle,  Ground down with back foot   To feel the stretch in the groin  10 reps      calf stretch Same position as hip flexor but bend the knee of the back leg back and forth with heel pff the ground and then down 10 reps    quad stretch  Grab bottom of pant leg and bring knee back slightly  10 reps    hamstrings Front leg, point toes up, heel on the ground, straighten knee as you "sit into imaginary chair"  Other foot, make sure toes point forward     seated figure-4  Cross one ankle over other thigh with knee above ankle  Lean trunk forward       ____   30 reps of clam shells with L hip up  ( laying on your side)  20 reps on R    ______  To completely urinate:  Apply the breathing to technique , using exhale with the belly muscles hugging in to gentle expand the pelvic floor muscles as a more natural and gentler way open the sphincters instead of "pushing"

## 2018-03-01 ENCOUNTER — Ambulatory Visit: Payer: 59 | Admitting: Urology

## 2018-03-01 ENCOUNTER — Encounter: Payer: Self-pay | Admitting: Urology

## 2018-03-01 ENCOUNTER — Ambulatory Visit (INDEPENDENT_AMBULATORY_CARE_PROVIDER_SITE_OTHER): Payer: 59 | Admitting: Urology

## 2018-03-01 VITALS — BP 153/90 | HR 80 | Resp 16 | Ht 72.0 in | Wt 230.0 lb

## 2018-03-01 DIAGNOSIS — N529 Male erectile dysfunction, unspecified: Secondary | ICD-10-CM

## 2018-03-07 ENCOUNTER — Encounter: Payer: 59 | Admitting: Physical Therapy

## 2018-03-08 ENCOUNTER — Telehealth (HOSPITAL_COMMUNITY): Payer: Self-pay

## 2018-03-08 NOTE — Telephone Encounter (Signed)
Encounter complete. 

## 2018-03-10 ENCOUNTER — Ambulatory Visit: Payer: 59 | Admitting: Urology

## 2018-03-10 ENCOUNTER — Ambulatory Visit (HOSPITAL_COMMUNITY)
Admission: RE | Admit: 2018-03-10 | Discharge: 2018-03-10 | Disposition: A | Discharge status: Home / Self Care | Payer: 59 | Source: Ambulatory Visit | Attending: Cardiovascular Disease | Admitting: Cardiovascular Disease

## 2018-03-10 DIAGNOSIS — I2102 ST elevation (STEMI) myocardial infarction involving left anterior descending coronary artery: Secondary | ICD-10-CM | POA: Insufficient documentation

## 2018-03-10 DIAGNOSIS — I2510 Atherosclerotic heart disease of native coronary artery without angina pectoris: Secondary | ICD-10-CM | POA: Diagnosis present

## 2018-03-10 DIAGNOSIS — I10 Essential (primary) hypertension: Secondary | ICD-10-CM | POA: Insufficient documentation

## 2018-03-10 LAB — MYOCARDIAL PERFUSION IMAGING
CHL CUP NUCLEAR SDS: 2
CHL CUP NUCLEAR SSS: 3
LVDIAVOL: 106 mL (ref 62–150)
LVSYSVOL: 58 mL
NUC STRESS TID: 1.3
Peak HR: 90 {beats}/min
Rest HR: 71 {beats}/min
SRS: 1

## 2018-03-10 IMAGING — NM NM MISC PROCEDURE
9 series · 54 of 54 positions shown · non-contrast
Comparison: none

[Series 1: rest sax · 6.4mm · 6.40mm/px · 6 of 22 frames shown]
[frame 2/22]
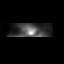
[frame 6/22]
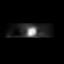
[frame 10/22]
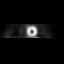
[frame 13/22]
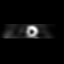
[frame 17/22]
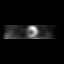
[frame 21/22]
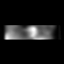

[Series 1: wbr rest · 6.40mm/px · 6 of 64 frames shown]
[frame 6/64]
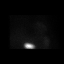
[frame 16/64]
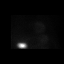
[frame 27/64]
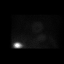
[frame 38/64]
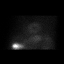
[frame 48/64]
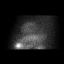
[frame 59/64]
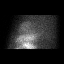

[Series 1: wbr_r-proj_st wbr rest · 6.40mm/px · 6 of 64 frames shown]
[frame 6/64]
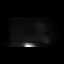
[frame 16/64]
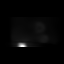
[frame 27/64]
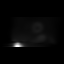
[frame 38/64]
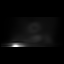
[frame 48/64]
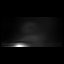
[frame 59/64]
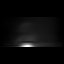

[Series 2: wbr_s-proj_st wbr stress-gsp · 6.40mm/px · 6 of 512 frames shown]
[frame 43/512]
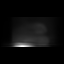
[frame 128/512]
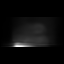
[frame 214/512]
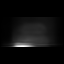
[frame 299/512]
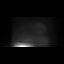
[frame 384/512]
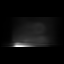
[frame 470/512]
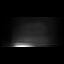

[Series 2: stress sax gs · 6.4mm · 6.40mm/px · 6 of 168 frames shown]
[frame 15/168]
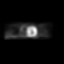
[frame 43/168]
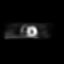
[frame 71/168]
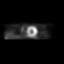
[frame 99/168]
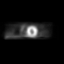
[frame 127/168]
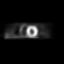
[frame 155/168]
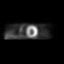

[Series 2: wbr stress-gsp · 6.40mm/px · 6 of 512 frames shown]
[frame 43/512]
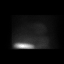
[frame 128/512]
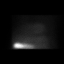
[frame 214/512]
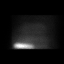
[frame 299/512]
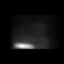
[frame 384/512]
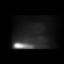
[frame 470/512]
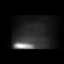

[Series 3: wbr stress-sum-em · 6.40mm/px · 6 of 64 frames shown]
[frame 6/64]
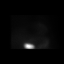
[frame 16/64]
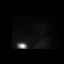
[frame 27/64]
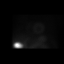
[frame 38/64]
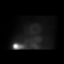
[frame 48/64]
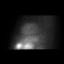
[frame 59/64]
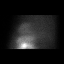

[Series 3: stress sax · 6.4mm · 6.40mm/px · 6 of 21 frames shown]
[frame 2/21]
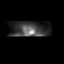
[frame 6/21]
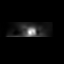
[frame 9/21]
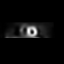
[frame 13/21]
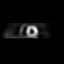
[frame 16/21]
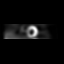
[frame 20/21]
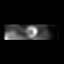

[Series 3: wbr_s-proj_st wbr stress-sum-em · 6.40mm/px · 6 of 64 frames shown]
[frame 6/64]
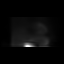
[frame 16/64]
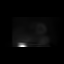
[frame 27/64]
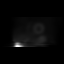
[frame 38/64]
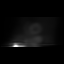
[frame 48/64]
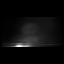
[frame 59/64]
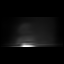

[54 of 54 positions shown; findings below may reference images not displayed]

Canned report from images found in remote index.

Refer to host system for actual result text.

## 2018-03-10 MED ORDER — TECHNETIUM TC 99M TETROFOSMIN IV KIT
32.1000 | PACK | Freq: Once | INTRAVENOUS | Status: AC | PRN
  Administered 2018-03-10: 32.1 via INTRAVENOUS
  Filled 2018-03-10: qty 33

## 2018-03-10 MED ORDER — REGADENOSON 0.4 MG/5ML IV SOLN
0.4000 mg | Freq: Once | INTRAVENOUS | Status: AC
  Administered 2018-03-10: 0.4 mg via INTRAVENOUS

## 2018-03-10 MED ORDER — TECHNETIUM TC 99M TETROFOSMIN IV KIT
9.7000 | PACK | Freq: Once | INTRAVENOUS | Status: AC | PRN
  Administered 2018-03-10: 9.7 via INTRAVENOUS
  Filled 2018-03-10: qty 10

## 2018-03-14 ENCOUNTER — Ambulatory Visit: Payer: 59 | Admitting: Physical Therapy

## 2018-03-14 DIAGNOSIS — R278 Other lack of coordination: Secondary | ICD-10-CM

## 2018-03-14 DIAGNOSIS — R29898 Other symptoms and signs involving the musculoskeletal system: Secondary | ICD-10-CM

## 2018-03-14 NOTE — Therapy (Signed)
[place] MAIN [facility] SERVICES [address] [place], [place], [address] Phone: [phone]   Fax:  [phone]  Physical Therapy Treatment  Patient Details  Name: Patrick [name] MRN: [id number] Date of Birth: [birth year] Referring Provider: Erlene Quan   Encounter Date: 03/14/2018  PT End of Session - 03/14/18 1257    Visit Number  7    Date for PT Re-Evaluation  04/06/18    PT Start Time  0815    PT Stop Time  0910    PT Time Calculation (min)  55 min    Activity Tolerance  Patient tolerated treatment well;No increased pain    Behavior During Therapy  WFL for tasks assessed/performed       Past Medical History:  Diagnosis Date  . CAD in native artery    a. anterolateral STEMI 07/2017 s/p DES to prox-mid LAD, EF 55%.  [place] Kitchen GERD (gastroesophageal reflux disease)   . Hyperlipidemia   . Hypertension   . Overactive bladder   . Renal calculi   . Uncontrolled diabetes mellitus ([place])     Past Surgical History:  Procedure Laterality Date  . CORONARY/GRAFT ACUTE MI REVASCULARIZATION N/A 08/04/2017   Procedure: Coronary/Graft Acute MI Revascularization;  Surgeon: Troy Sine, MD;  Location: [facility];  Service: Cardiovascular;  Laterality: N/A;  . LEFT HEART CATH AND CORONARY ANGIOGRAPHY N/A 08/04/2017   Procedure: LEFT HEART CATH AND CORONARY ANGIOGRAPHY;  Surgeon: Troy Sine, MD;  Location: [place];  Service: Cardiovascular;  Laterality: N/A;  . PROSTATE SURGERY      There were no vitals filed for this visit.  Subjective Assessment - 03/14/18 0817    Subjective  Pt reported he continues to do the Open Book exercise, the Deep core exercise, and hip flexor stretch. Getting out of bed with log rolling is getting more automatic.  Pt states his urinary frequency has improved by 30-40%. "There are still go and bad days."  The urge to go to the bathroom is distracting him much less. Before starting PT, he was feeling much  controlled by his bladder and now, he is feeling more better about that.  He feels "he is not out of the woods yet".  He had felt hopeless before, and that he is not there yet, he can get to a better place than he had been.        Pertinent History  Pt has undergone thermal treatment to shrink prostate 7 years ago.  Sedentatry at work.  Pt walks 3.5 miles across 5-6 days a week. Heart attack 07/2017. 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 This was the schedule for 5-6 days per week for 7 years.     Patient Stated Goals  reduce the number of times going to the toilet daily                 No data recorded    Pelvic Floor Special Questions - 03/14/18 1245    Pelvic Floor Internal Exam  pt consented verbally without contraindications     Exam Type  Rectal    Palpation  increased tenderness  and tensions at posterior portion of puborectalis mm ( decreased post Tx)         [facility] Adult PT Treatment/Exercise - 03/14/18 0001      Neuro Re-ed    Neuro Re-ed Details   see  pt instructions; body scan and physiology of nervous system      Manual Therapy   Internal Pelvic Floor  STM, pelvic tilts at posterior puborectalis mm . Decreased palpation depth to pt's tolerance in order to proceed with assessment and Tx.               PT Education - 03/14/18 1257    Education provided  Yes    Education Details  HEP    Person(s) Educated  Patient    Methods  Explanation;Demonstration;Tactile cues;Verbal cues;Handout    Comprehension  Returned demonstration;Verbalized understanding;Verbal cues required;Tactile cues required          PT Long Term Goals - 02/28/18 0816      PT LONG TERM GOAL #1   Title  Pt will report decreased nocturia from 3-4 x night to < 2-3 x night in order to improve sleep     Time  12    Period  Weeks    Status  Achieved      PT LONG TERM GOAL #2   Title  Pt will report decreased urinary  frequency of 2-3 x/hr  to 1-2 x / hr during an action movie in order to enjoy his hobby    Time  10    Period  Weeks    Status  Achieved      PT LONG TERM GOAL #3   Title  Pt will notice no urge to urinate when standing up after a meeting in order to work effectively    Time  8    Period  Weeks    Status  Partially Met      PT LONG TERM GOAL #4   Title  Pt will decrease his ZUNG anxiety score from  30% to < 25 % in order to decrease urinary urgency and frequency  (2/26: 29%)     Time  12    Period  Weeks    Status  Partially Met      PT LONG TERM GOAL #5   Title  Pt will report improved bladder irritant to water ratio in order to promote bladder health    Time  2    Period  Weeks    Status  Achieved      Additional Long Term Goals   Additional Long Term Goals  Yes      PT LONG TERM GOAL #6   Title  Pt will decrease his NIH-CPSI score from  37% to <32 % in order to urinate with more control and improve QOL (2/26: 30%)     Time  6    Period  Weeks    Status  Achieved      PT LONG TERM GOAL #7   Title  Pt will demo increased hip strength for hip ext and hip abduction B from 3+/5 to 4/5  and demo IND with  pre-postwalking stretches in order to maintain wellness and prevent injuries    Time  8    Period  Weeks    Status  New      PT LONG TERM GOAL #8   Title  Pt will report complete emptying urine in the morning across 2 mornings in order to return perform ADLs in the morning     Time  4    Period  Weeks    Status  New    Target Date  03/28/18            Plan -  03/14/18 1258    Clinical Impression Statement  Addressed tight pelvic floor mm with rectal assessment and Tx. Pt was abel to demo proper pelvic tilt to lengthen mm in sidelying and sitting. Pt demo'd increased pelvic floor lengthening for optimal pelvic floor function. Anticipate pt will continue to make further progress with urinary frequency. Pt states he is gaining more control his bladder and is able to not  be distracted from his work with trips to the bathroom. Discussed the benefit from cardiac rehab after d/c from pelvic floor PT given he has had a heart attack last year and he can learn proper and safe ways to exercise.  Pt voiced agreement. Pt will be seeing his cardiologist this week. PT plans to communicate with cardiologist about referral to cardiac rehab at [facility]. Pt continues to benefit from skilled pelvic PT for a few more sessions before d/c.     Rehab Potential  Good    PT Frequency  1x / week    PT Duration  12 weeks    PT Treatment/Interventions  Therapeutic activities;Moist Heat;Therapeutic exercise;Balance training;Neuromuscular re-education;Manual techniques;Patient/family education;Energy conservation    Consulted and Agree with Plan of Care  Patient       Patient will benefit from skilled therapeutic intervention in order to improve the following deficits and impairments:  Increased muscle spasms, Improper body mechanics, Decreased strength, Decreased mobility, Postural dysfunction, Decreased endurance, Decreased coordination, Decreased range of motion  Visit Diagnosis: Other lack of coordination  Other symptoms and signs involving the musculoskeletal system     Problem List Patient Active Problem List   Diagnosis Date Noted  . CAD in native artery 08/06/2017  . Essential hypertension 08/06/2017  . Hypokalemia 08/06/2017  . ST elevation myocardial infarction involving left anterior descending (LAD) coronary artery ([place])   . Type 2 diabetes mellitus with complication, without long-term current use of insulin ([place])   . Hyperlipidemia LDL goal <70     Jerl Mina ,PT, DPT, E-RYT  03/14/2018, 1:03 PM  [place] MAIN [facility] SERVICES [address] [place], [place], [address] Phone: [phone]   Fax:  [phone]  Name: DAKHARI ZUVER MRN: [id number] Date of Birth: 1956/10/21

## 2018-03-14 NOTE — Patient Instructions (Signed)
Pelvic tilts,  Finding pelvic neutral when sitting, feet on ground

## 2018-03-15 ENCOUNTER — Telehealth: Payer: Self-pay

## 2018-03-15 NOTE — Telephone Encounter (Signed)
Pt called stating his myrbetriq PA was denied. Pt stated that letter stated oxybutynin would be covered. Please advise.

## 2018-03-15 NOTE — Telephone Encounter (Signed)
It is listed in his medication list that he is taking tolterodine (Detrol [place]).  This is in the same family of medications as the oxybutynin.  Sometimes we use Myrbetriq synergistically with tolterodine.  Was he to take the Myrbetriq instead of the Detrol or together with the Myrbetriq?

## 2018-03-16 ENCOUNTER — Encounter: Payer: Self-pay | Admitting: Cardiovascular Disease

## 2018-03-16 ENCOUNTER — Ambulatory Visit (INDEPENDENT_AMBULATORY_CARE_PROVIDER_SITE_OTHER): Payer: 59 | Admitting: Cardiovascular Disease

## 2018-03-16 VITALS — BP 180/97 | HR 75 | Ht 73.0 in | Wt 234.4 lb

## 2018-03-16 DIAGNOSIS — E785 Hyperlipidemia, unspecified: Secondary | ICD-10-CM

## 2018-03-16 DIAGNOSIS — E118 Type 2 diabetes mellitus with unspecified complications: Secondary | ICD-10-CM | POA: Diagnosis not present

## 2018-03-16 DIAGNOSIS — K219 Gastro-esophageal reflux disease without esophagitis: Secondary | ICD-10-CM | POA: Diagnosis not present

## 2018-03-16 DIAGNOSIS — I10 Essential (primary) hypertension: Secondary | ICD-10-CM

## 2018-03-16 DIAGNOSIS — Z01812 Encounter for preprocedural laboratory examination: Secondary | ICD-10-CM | POA: Diagnosis not present

## 2018-03-16 DIAGNOSIS — I25119 Atherosclerotic heart disease of native coronary artery with unspecified angina pectoris: Secondary | ICD-10-CM

## 2018-03-16 DIAGNOSIS — R9439 Abnormal result of other cardiovascular function study: Secondary | ICD-10-CM | POA: Diagnosis not present

## 2018-03-16 MED ORDER — METOPROLOL TARTRATE 25 MG PO TABS: 37.5000 mg | ORAL_TABLET | Freq: Two times a day (BID) | ORAL | 3 refills | Status: DC

## 2018-03-16 MED ORDER — ICOSAPENT ETHYL 1 G PO CAPS: 2.0000 g | ORAL_CAPSULE | Freq: Two times a day (BID) | ORAL | 3 refills | Status: DC

## 2018-03-16 MED ORDER — AMLODIPINE BESYLATE 5 MG PO TABS: 5.0000 mg | ORAL_TABLET | Freq: Every day | ORAL | 3 refills | Status: DC

## 2018-03-16 NOTE — Progress Notes (Signed)
Cardiology Office Note    Date:  03/17/2018   ID:  [name], DOB 03-Aug-1956, MRN [id number]  PCP:  Kirk Ruths, MD  Cardiologist:  Shelva Majestic, MD    History of Present Illness:  Patrick [name] is a 62 y.o. male who suffered an anterior ST segment elevation myocardial infarction on 08/04/2017. .  I saw him for initial office visit in November 2018.  He presents for follow-up evaluation following a recent nuclear stress test.  Patrick [name] has a history of hypertension, hyperlipidemia, and diabetes mellitus.  He was admitted on 08/04/2017 with ST segment elevation anterolaterally and a code STEMI was activated.  I performed emergent cardiac catheterization which revealed total occlusion of his LAD after the first diagonal vessel.  There was a long segment of occlusion and ultimately a Resolute on a 3.038 mm stent was inserted, postdilated 3.25 mm with 100% occlusion being reduced to 0% and TIMI 0 flow been improved to TIMI 3 flow.  He developed transient heart block during the procedure which required IV atropine.  Of note, his lipid panel during that evaluation was consistent with an atherogenic dyslipidemic pattern with triglycerides of 370, VLDL 74, low HDL at 29, and his total cholesterol was 204 with LDL 101.  He was started on atorvastatin and also was on fenofibrate.  Subsequent, please fenofibrate was discontinued by his primary physician and he has been taking over-the-counter fish oil.  Repeat blood work 1 month later by his primary physician showed a total cholesterol 124, LDL 47, triglycerides were 251, HDL was 26.5.  Over the past several months, he has done well.  He denies any definitive recurrent anginal symptomatology.  He had experienced 1 vague episode of  similar sensation after working 16 hours a day for 3 days and having to go to another business trip in [place].    Since I last saw him, he has done fairly well. He has noticed some occasional episodes of heartburn  sensation, but also has noted this with mild exertion.  He was seen by [name], NP on 02/28/2018.  Because of worsening heartburn symptoms and fatigue, which seemed nitrate responsive.  She recommended a follow-up nuclear perfusion study for reassessment.  His nuclear study was done on 03/10/2018 and was interpreted as a high risk study.  EF was 45%.  He ischemia, both involving the inferior as well as anterior wall.  There was diffuse hypokinesis.  He is added onto my schedule today  and is seen now for further evaluation.  Past Medical History:  Diagnosis Date  . CAD in native artery    a. anterolateral STEMI 07/2017 s/p DES to prox-mid LAD, EF 55%.  Marland Kitchen GERD (gastroesophageal reflux disease)   . Hyperlipidemia   . Hypertension   . Overactive bladder   . Renal calculi   . Uncontrolled diabetes mellitus ([place])     Past Surgical History:  Procedure Laterality Date  . CORONARY/GRAFT ACUTE MI REVASCULARIZATION N/A 08/04/2017   Procedure: Coronary/Graft Acute MI Revascularization;  Surgeon: Troy Sine, MD;  Location: [facility];  Service: Cardiovascular;  Laterality: N/A;  . LEFT HEART CATH AND CORONARY ANGIOGRAPHY N/A 08/04/2017   Procedure: LEFT HEART CATH AND CORONARY ANGIOGRAPHY;  Surgeon: Troy Sine, MD;  Location: [facility];  Service: Cardiovascular;  Laterality: N/A;  . PROSTATE SURGERY      Current Medications: Outpatient Medications Prior to Visit  Medication Sig Dispense Refill  . aspirin 81  MG tablet Take 1 tablet (81 mg total) by mouth daily. 90 tablet 3  . atorvastatin (LIPITOR) 40 MG tablet TAKE 1 TABLET(40 MG) BY MOUTH EVERY EVENING 30 tablet 3  . Coenzyme Q10 200 MG capsule Take 200 mg by mouth daily.    . famotidine (PEPCID) 40 MG tablet Take 1 tablet (40 mg total) by mouth daily after lunch. 30 tablet 6  . glimepiride (AMARYL) 2 MG tablet Take 2 mg by mouth daily with breakfast.    . Glucosamine-Chondroit-Vit C-Mn (GLUCOSAMINE 1500 COMPLEX  PO) Take 1,500 mg by mouth daily.    Marland Kitchen losartan (COZAAR) 100 MG tablet TAKE 1 TABLET(100 MG) BY MOUTH DAILY 30 tablet 3  . Magnesium 250 MG TABS Take 250 mg by mouth daily.    . Magnesium Citrate 100 MG TABS Take 100 mg by mouth daily.    . metformin (FORTAMET) 500 MG (OSM) 24 hr tablet Take 2,000 mg by mouth 2 (two) times daily with a meal.     . Multiple Vitamins-Minerals (ABC PLUS SENIOR PO) Take 1 tablet by mouth daily.    . nitroGLYCERIN (NITROSTAT) 0.4 MG SL tablet Place 1 tablet (0.4 mg total) under the tongue every 5 (five) minutes x 3 doses as needed for chest pain. 25 tablet 3  . pantoprazole (PROTONIX) 40 MG tablet Take 40 mg by mouth daily.    . Potassium 99 MG TABS Take 99 mg by mouth daily.    Marland Kitchen pyridOXINE (VITAMIN B-6) 50 MG tablet Take 50 mg by mouth daily.    . ticagrelor (BRILINTA) 90 MG TABS tablet Take 1 tablet (90 mg total) by mouth 2 (two) times daily. 60 tablet 11  . tolterodine (DETROL LA) 4 MG 24 hr capsule Take 1 capsule (4 mg total) by mouth daily. 30 capsule 11  . vitamin C (ASCORBIC ACID) 500 MG tablet Take 500 mg by mouth daily.    . vitamin E 400 UNIT capsule Take 400 Units by mouth daily.    . metoprolol tartrate (LOPRESSOR) 25 MG tablet Take 37.5 mg (1 1/2 tablets) in the morning and 25 mg (1 tablets) at night 225 tablet 3  . Omega-3 Fatty Acids (FISH OIL) 1200 MG CAPS Take 1,200 mg by mouth daily.     No facility-administered medications prior to visit.      Allergies:   Patient has no known allergies.   Social History   Socioeconomic History  . Marital status: Married    Spouse name: Not on file  . Number of children: Not on file  . Years of education: Not on file  . Highest education level: Not on file  Occupational History  . Not on file  Social Needs  . Financial resource strain: Not on file  . Food insecurity:    Worry: Not on file    Inability: Not on file  . Transportation needs:    Medical: Not on file    Non-medical: Not on file    Tobacco Use  . Smoking status: Never Smoker  . Smokeless tobacco: Never Used  Substance and Sexual Activity  . Alcohol use: No  . Drug use: No  . Sexual activity: Not on file  Lifestyle  . Physical activity:    Days per week: Not on file    Minutes per session: Not on file  . Stress: Not on file  Relationships  . Social connections:    Talks on phone: Not on file    Gets together: Not on  file    Attends religious service: Not on file    Active member of club or organization: Not on file    Attends meetings of clubs or organizations: Not on file    Relationship status: Not on file  Other Topics Concern  . Not on file  Social History Narrative  . Not on file     Family History:  The patient's family history includes Heart attack in his father; Heart disease in his father.   ROS General: Negative; No fevers, chills, or night sweats;  HEENT: Negative; No changes in vision or hearing, sinus congestion, difficulty swallowing Pulmonary: Negative; No cough, wheezing, shortness of breath, hemoptysis Cardiovascular:  .  See history of present illness GI: Negative; No nausea, vomiting, diarrhea, or abdominal pain GU: Negative; No dysuria, hematuria, or difficulty voiding Musculoskeletal: Negative; no myalgias, joint pain, or weakness Hematologic/Oncology: Negative; no easy bruising, bleeding Endocrine: Positive for diabetes Neuro: Negative; no changes in balance, headaches Skin: Negative; No rashes or skin lesions Psychiatric: Negative; No behavioral problems, depression Sleep: Negative; No snoring, daytime sleepiness, hypersomnolence, bruxism, restless legs, hypnogognic hallucinations, no cataplexy Other comprehensive 14 point system review is negative.   PHYSICAL EXAM:   BP (!) 180/97   Pulse 75   Ht _0  (1.854 m)   Wt 234 lb 6.4 oz (106.3 kg)   BMI 30.93 kg/m     Repeat blood pressure was 170/88  Wt Readings from Last 3 Encounters:  03/16/18 234 lb 6.4 oz (106.3  kg)  03/10/18 230 lb (104.3 kg)  03/01/18 230 lb (104.3 kg)    General: Alert, oriented, no distress.  Skin: normal turgor, no rashes, warm and dry HEENT: Normocephalic, atraumatic. Pupils equal round and reactive to light; sclera anicteric; extraocular muscles intact; Fundi .  No hemorrhages or exudates.  Disc flat Nose without nasal septal hypertrophy Mouth/Parynx benign; Mallinpatti scale 3 Neck: No JVD, no carotid bruits; normal carotid upstroke Lungs: clear to ausculatation and percussion; no wheezing or rales Chest wall: without tenderness to palpitation Heart: PMI not displaced, RRR, s1 s2 normal, 1/6 systolic murmur, no diastolic murmur, no rubs, gallops, thrills, or heaves Abdomen:.  I'll send her adiposity. soft, nontender; no hepatosplenomehaly, BS+; abdominal aorta nontender and not dilated by palpation. Back: no CVA tenderness Pulses 2+ Musculoskeletal: full range of motion, normal strength, no joint deformities Extremities: no clubbing cyanosis or edema, Homan's sign negative  Neurologic: grossly nonfocal; Cranial nerves grossly wnl Psychologic: Normal mood and affect   Studies/Labs Reviewed:   EKG:  EKG is ordered today.  ECG (independently read by me):NSR at 63;RBBB with repolarization changes QTc 431 msec  November 2018 ECG (independently read by me): Normal sinus rhythm at 66 bpm.  Right bundle branch block with repolarization changes.  QTc interval 448 ms.  Recent Labs: BMP Latest Ref Rng & Units 03/16/2018 08/05/2017 08/04/2017  Glucose 65 - 99 mg/dL 114(H) 174(H) 367(H)  BUN 8 - 27 mg/dL 23 17 28(H)  Creatinine 0.76 - 1.27 mg/dL 1.09 1.16 1.20  BUN/Creat Ratio 10 - 24 21 - -  Sodium 134 - 144 mmol/L 145(H) 138 136  Potassium 3.5 - 5.2 mmol/L 4.5 3.4(L) 4.7  Chloride 96 - 106 mmol/L 107(H) 105 98(L)  CO2 20 - 29 mmol/L 17(L) 24 -  Calcium 8.6 - 10.2 mg/dL 9.6 8.6(L) -     Hepatic Function Latest Ref Rng & Units 08/04/2017 11/08/2016  Total Protein 6.5 - 8.1  g/dL 6.6 6.6  Albumin 3.5 - 5.0  g/dL 4.1 4.2  AST 15 - 41 U/L 36 31  ALT 17 - 63 U/L 55 30  Alk Phosphatase 38 - 126 U/L 35(L) 27(L)  Total Bilirubin 0.3 - 1.2 mg/dL 0.7 0.5  Bilirubin, Direct 0.1 - 0.5 mg/dL - 0.2    CBC Latest Ref Rng & Units 03/16/2018 08/05/2017 08/04/2017  WBC 3.4 - 10.8 x10E3/uL 6.5 9.5 -  Hemoglobin 13.0 - 17.7 g/dL 15.4 13.5 12.6(L)  Hematocrit 37.5 - 51.0 % 44.0 37.7(L) 37.0(L)  Platelets 150 - 379 x10E3/uL 208 218 -   Lab Results  Component Value Date   MCV 87 03/16/2018   MCV 83.8 08/05/2017   MCV 83.6 08/04/2017   No results found for: TSH Lab Results  Component Value Date   HGBA1C 9.8 (H) 08/05/2017     BNP No results found for: BNP  ProBNP No results found for: PROBNP   Lipid Panel     Component Value Date/Time   CHOL 204 (H) 08/04/2017 1005   TRIG 370 (H) 08/04/2017 1005   HDL 29 (L) 08/04/2017 1005   CHOLHDL 7.0 08/04/2017 1005   VLDL 74 (H) 08/04/2017 1005   LDLCALC 101 (H) 08/04/2017 1005     RADIOLOGY: No results found.   Additional studies/ records that were reviewed today include:   Emergent cardiac catheterization/PCI 08/04/2017: Conclusion     LV end diastolic pressure is mildly elevated.  The left ventricular systolic function is normal.  A STENT RESOLUTE [id number] drug eluting stent was successfully placed.  Prox LAD to Mid LAD lesion, 100 %stenosed.  Post intervention, there is a 0% residual stenosis.   Acute ST segment elevation myocardial infarction secondary to total occlusion of the LAD immediately after the takeoff of the first diagonal and septal perforating artery.  There was TIMI 0 flow.  Mild luminal irregularity of the left circumflex vessel without significant obstructive stenoses.  Dominant normal RCA.  Successful PCI to the totally occluded LAD which resulted in a long segment of occlusion and ultimately treated with PTCA and DES stenting with a Resolute onyx 3.038 mm stent postdilated to  3.25 mm with 100% occlusion being reduced to 0% and TIMI 0 flow being improved to TIMI 3 flow.  Transient heart block treated with IV atropine.  Preserved global LV contractility with subtle apical inferior focal hypocontractility.  The patient presented to [facility] emergency room at ~ 8:45 AM.  He presented to [facility] catheterization lab at approximately 10:45 AM.  During balloon time from presentation to [facility] catheterization laboratory was ~ 20 minutes.  RECOMMENDATION: The patient will continue with dual antiplatelet therapy for minimum of a year.  He will be started on high potency statin therapy, low-dose beta blocker therapy, with ultimate ACE inhibition.  Metformin will be held for 48 hours postcontrast.    03/10/2018 Nuclear Study Highlights     The left ventricular ejection fraction is mildly decreased (45-54%).  Nuclear stress EF: 45%.  There was no ST segment deviation noted during stress.  Findings consistent with ischemia.  This is a high risk study.   TID 1.3 Normalization artifact likely but appears to be both inferior and anterior ischemia Diffuse hypokinesis EF 45%     ASSESSMENT:    1. Abnormal nuclear stress test   2. Pre-procedure lab exam   3. Coronary artery disease involving native coronary artery of native heart with angina pectoris ([place])   4. Hyperlipidemia LDL goal <70   5. Essential hypertension   6. Type 2  diabetes mellitus with complication, without long-term current use of insulin ([place])   7. Gastroesophageal reflux disease without esophagitis      PLAN:  Patrick [name] is a 62 year old gentleman who has a history of hypertension, hyperlipidemia, and diabetes mellitus. On 08/04/2017 he presented with an acute anterior wall ST segment elevation myocardial infarctions and was found to have total occlusion of his LAD after the first diagonal vessel.  He was found to have a long occluded segment which was successfully treated with DES  stenting with a 3.038 mm Resolute DES stent.  Subsequently, he had done well.  He has been walking on a daily basis. When I saw for initial posthospital evaluation his BMI was mildly increased at 31.36.  Weight reduction was recommended.  He also has mild central adiposity and discussed with him that this type of fat typically is associated with greater levels of inflammation. His blood pressure was elevated and I titrated his medications.  Blood pressure today is significantly elevated at 170/88 on repeat by me, despite taking metoprolol 37.5 mg in the morning and 25 mg at night,and losartan 100 mg daily. Marland Kitchen  He did recently developed heartburn-like sensation and I reviewed his nuclear scan with him thoroughly today in the office.  He presents study suggests the possibility of ischemia involving both the anterior and inferior wall.  EF was 45% and there was diffuse hypokinesis.  With his blood pressure elevation, and nuclear study suggestive of ischemia, I am adding amlodipine 5 mg, which she'll be beneficial both for blood pressure elevation as well as improve coronary perfusion am further titrating metoprolol to 37.5 mg twice a day.  I have recommended definitive cardiac catheterization.  He has continued to be on aspirin/brilinta for antiplatelet therapy.  He is on atorvastatin 40 mg and over-the-counter fish oil.  I again discussed with him the Reduce-it trial and particularly with his elevated triglycerides at 370, have recommended for Vascepa 2 g twice a day.  He is diabetic on glimepiride and metformin.  He has GERD on pantoprazole. I have reviewed the risks, indications, and alternatives to cardiac catheterization, possible angioplasty, and stenting with the patient. Risks include but are not limited to bleeding, infection, vascular injury, stroke, myocardial infection, arrhythmia, kidney injury, radiation-related injury in the case of prolonged fluoroscopy use, emergency cardiac surgery, and death. The  patient understands the risks of serious complication is 1-2 in 1517 with diagnostic cardiac cath and 1-2% or less with angioplasty/stenting. Catheterization will be scheduled for Monday 03/21/18.   tedication Adjustments/Labs and Tests Ordered: Current medicines are reviewed at length with the patient today.  Concerns regarding medicines are outlined above.  Medication changes, Labs and Tests ordered today are listed in the Patient Instructions below. Patient Instructions  Medication Instructions:  START Vascepa 2 g two times daily  INCREASE metoprolol tartrate to 37.5 mg two times daily  START amlodipine 5 mg daily  Labwork: TODAY (BMET, CBC)  Testing/Procedures: Your physician has requested that you have a cardiac catheterization. Cardiac catheterization is used to diagnose and/or treat various heart conditions. Doctors may recommend this procedure for a number of different reasons. The most common reason is to evaluate chest pain. Chest pain can be a symptom of coronary artery disease (CAD), and cardiac catheterization can show whether plaque is narrowing or blocking your heart's arteries. This procedure is also used to evaluate the valves, as well as measure the blood flow and oxygen levels in different parts of your heart. For further  information please visit [email or web address]. Please follow instruction sheet, as given.  Follow-Up: After cath with Dr. Claiborne Billings  Any Other Special Instructions Will Be Listed Below (If Applicable).     If you need a refill on your cardiac medications before your next appointment, please call your pharmacy.      Signed, Shelva Majestic, MD  03/17/2018 [facility] [address], [place], [place], [place]  [address] Phone: [phone]

## 2018-03-16 NOTE — H&P (View-Only) (Signed)
Cardiology Office Note    Date:  03/17/2018   ID:  [name], DOB 03-Aug-1956, MRN [id number]  PCP:  Kirk Ruths, MD  Cardiologist:  Shelva Majestic, MD    History of Present Illness:  Patrick [name] is a 62 y.o. male who suffered an anterior ST segment elevation myocardial infarction on 08/04/2017. .  I saw him for initial office visit in November 2018.  He presents for follow-up evaluation following a recent nuclear stress test.  Patrick [name] has a history of hypertension, hyperlipidemia, and diabetes mellitus.  He was admitted on 08/04/2017 with ST segment elevation anterolaterally and a code STEMI was activated.  I performed emergent cardiac catheterization which revealed total occlusion of his LAD after Patrick first diagonal vessel.  There was a long segment of occlusion and ultimately a Resolute on a 3.038 mm stent was inserted, postdilated 3.25 mm with 100% occlusion being reduced to 0% and TIMI 0 flow been improved to TIMI 3 flow.  He developed transient heart block during Patrick procedure which required IV atropine.  Of note, his lipid panel during that evaluation was consistent with an atherogenic dyslipidemic pattern with triglycerides of 370, VLDL 74, low HDL at 29, and his total cholesterol was 204 with LDL 101.  He was started on atorvastatin and also was on fenofibrate.  Subsequent, please fenofibrate was discontinued by his primary physician and he has been taking over-Patrick-counter fish oil.  Repeat blood work 1 month later by his primary physician showed a total cholesterol 124, LDL 47, triglycerides were 251, HDL was 26.5.  Over Patrick past several months, he has done well.  He denies any definitive recurrent anginal symptomatology.  He had experienced 1 vague episode of  similar sensation after working 16 hours a day for 3 days and having to go to another business trip in [place].    Since I last saw him, he has done fairly well. He has noticed some occasional episodes of heartburn  sensation, but also has noted this with mild exertion.  He was seen by [name], NP on 02/28/2018.  Because of worsening heartburn symptoms and fatigue, which seemed nitrate responsive.  She recommended a follow-up nuclear perfusion study for reassessment.  His nuclear study was done on 03/10/2018 and was interpreted as a high risk study.  EF was 45%.  He ischemia, both involving Patrick inferior as well as anterior wall.  There was diffuse hypokinesis.  He is added onto my schedule today  and is seen now for further evaluation.  Past Medical History:  Diagnosis Date  . CAD in native artery    a. anterolateral STEMI 07/2017 s/p DES to prox-mid LAD, EF 55%.  Marland Kitchen GERD (gastroesophageal reflux disease)   . Hyperlipidemia   . Hypertension   . Overactive bladder   . Renal calculi   . Uncontrolled diabetes mellitus ([place])     Past Surgical History:  Procedure Laterality Date  . CORONARY/GRAFT ACUTE MI REVASCULARIZATION N/A 08/04/2017   Procedure: Coronary/Graft Acute MI Revascularization;  Surgeon: Troy Sine, MD;  Location: [facility];  Service: Cardiovascular;  Laterality: N/A;  . LEFT HEART CATH AND CORONARY ANGIOGRAPHY N/A 08/04/2017   Procedure: LEFT HEART CATH AND CORONARY ANGIOGRAPHY;  Surgeon: Troy Sine, MD;  Location: [facility];  Service: Cardiovascular;  Laterality: N/A;  . PROSTATE SURGERY      Current Medications: Outpatient Medications Prior to Visit  Medication Sig Dispense Refill  . aspirin 81  MG tablet Take 1 tablet (81 mg total) by mouth daily. 90 tablet 3  . atorvastatin (LIPITOR) 40 MG tablet TAKE 1 TABLET(40 MG) BY MOUTH EVERY EVENING 30 tablet 3  . Coenzyme Q10 200 MG capsule Take 200 mg by mouth daily.    . famotidine (PEPCID) 40 MG tablet Take 1 tablet (40 mg total) by mouth daily after lunch. 30 tablet 6  . glimepiride (AMARYL) 2 MG tablet Take 2 mg by mouth daily with breakfast.    . Glucosamine-Chondroit-Vit C-Mn (GLUCOSAMINE 1500 COMPLEX  PO) Take 1,500 mg by mouth daily.    Marland Kitchen losartan (COZAAR) 100 MG tablet TAKE 1 TABLET(100 MG) BY MOUTH DAILY 30 tablet 3  . Magnesium 250 MG TABS Take 250 mg by mouth daily.    . Magnesium Citrate 100 MG TABS Take 100 mg by mouth daily.    . metformin (FORTAMET) 500 MG (OSM) 24 hr tablet Take 2,000 mg by mouth 2 (two) times daily with a meal.     . Multiple Vitamins-Minerals (ABC PLUS SENIOR PO) Take 1 tablet by mouth daily.    . nitroGLYCERIN (NITROSTAT) 0.4 MG SL tablet Place 1 tablet (0.4 mg total) under Patrick tongue every 5 (five) minutes x 3 doses as needed for chest pain. 25 tablet 3  . pantoprazole (PROTONIX) 40 MG tablet Take 40 mg by mouth daily.    . Potassium 99 MG TABS Take 99 mg by mouth daily.    Marland Kitchen pyridOXINE (VITAMIN B-6) 50 MG tablet Take 50 mg by mouth daily.    . ticagrelor (BRILINTA) 90 MG TABS tablet Take 1 tablet (90 mg total) by mouth 2 (two) times daily. 60 tablet 11  . tolterodine (DETROL LA) 4 MG 24 hr capsule Take 1 capsule (4 mg total) by mouth daily. 30 capsule 11  . vitamin C (ASCORBIC ACID) 500 MG tablet Take 500 mg by mouth daily.    . vitamin E 400 UNIT capsule Take 400 Units by mouth daily.    . metoprolol tartrate (LOPRESSOR) 25 MG tablet Take 37.5 mg (1 1/2 tablets) in Patrick morning and 25 mg (1 tablets) at night 225 tablet 3  . Omega-3 Fatty Acids (FISH OIL) 1200 MG CAPS Take 1,200 mg by mouth daily.     No facility-administered medications prior to visit.      Allergies:   [name] has no known allergies.   Social History   Socioeconomic History  . Marital status: Married    Spouse name: Not on file  . Number of children: Not on file  . Years of education: Not on file  . Highest education level: Not on file  Occupational History  . Not on file  Social Needs  . Financial resource strain: Not on file  . Food insecurity:    Worry: Not on file    Inability: Not on file  . Transportation needs:    Medical: Not on file    Non-medical: Not on file    Tobacco Use  . Smoking status: Never Smoker  . Smokeless tobacco: Never Used  Substance and Sexual Activity  . Alcohol use: No  . Drug use: No  . Sexual activity: Not on file  Lifestyle  . Physical activity:    Days per week: Not on file    Minutes per session: Not on file  . Stress: Not on file  Relationships  . Social connections:    Talks on phone: Not on file    Gets together: Not on  file    Attends religious service: Not on file    Active member of club or organization: Not on file    Attends meetings of clubs or organizations: Not on file    Relationship status: Not on file  Other Topics Concern  . Not on file  Social History Narrative  . Not on file     Family History:  Patrick [name]'s family history includes Heart attack in his father; Heart disease in his father.   ROS General: Negative; No fevers, chills, or night sweats;  HEENT: Negative; No changes in vision or hearing, sinus congestion, difficulty swallowing Pulmonary: Negative; No cough, wheezing, shortness of breath, hemoptysis Cardiovascular:  .  See history of present illness GI: Negative; No nausea, vomiting, diarrhea, or abdominal pain GU: Negative; No dysuria, hematuria, or difficulty voiding Musculoskeletal: Negative; no myalgias, joint pain, or weakness Hematologic/Oncology: Negative; no easy bruising, bleeding Endocrine: Positive for diabetes Neuro: Negative; no changes in balance, headaches Skin: Negative; No rashes or skin lesions Psychiatric: Negative; No behavioral problems, depression Sleep: Negative; No snoring, daytime sleepiness, hypersomnolence, bruxism, restless legs, hypnogognic hallucinations, no cataplexy Other comprehensive 14 point system review is negative.   PHYSICAL EXAM:   BP (!) 180/97   Pulse 75   Ht _0  (1.854 m)   Wt 234 lb 6.4 oz (106.3 kg)   BMI 30.93 kg/m     Repeat blood pressure was 170/88  Wt Readings from Last 3 Encounters:  03/16/18 234 lb 6.4 oz (106.3  kg)  03/10/18 230 lb (104.3 kg)  03/01/18 230 lb (104.3 kg)    General: Alert, oriented, no distress.  Skin: normal turgor, no rashes, warm and dry HEENT: Normocephalic, atraumatic. Pupils equal round and reactive to light; sclera anicteric; extraocular muscles intact; Fundi .  No hemorrhages or exudates.  Disc flat Nose without nasal septal hypertrophy Mouth/Parynx benign; Mallinpatti scale 3 Neck: No JVD, no carotid bruits; normal carotid upstroke Lungs: clear to ausculatation and percussion; no wheezing or rales Chest wall: without tenderness to palpitation Heart: PMI not displaced, RRR, s1 s2 normal, 1/6 systolic murmur, no diastolic murmur, no rubs, gallops, thrills, or heaves Abdomen:.  I'll send her adiposity. soft, nontender; no hepatosplenomehaly, BS+; abdominal aorta nontender and not dilated by palpation. Back: no CVA tenderness Pulses 2+ Musculoskeletal: full range of motion, normal strength, no joint deformities Extremities: no clubbing cyanosis or edema, Homan's sign negative  Neurologic: grossly nonfocal; Cranial nerves grossly wnl Psychologic: Normal mood and affect   Studies/Labs Reviewed:   EKG:  EKG is ordered today.  ECG (independently read by me):NSR at 63;RBBB with repolarization changes QTc 431 msec  November 2018 ECG (independently read by me): Normal sinus rhythm at 66 bpm.  Right bundle branch block with repolarization changes.  QTc interval 448 ms.  Recent Labs: BMP Latest Ref Rng & Units 03/16/2018 08/05/2017 08/04/2017  Glucose 65 - 99 mg/dL 114(H) 174(H) 367(H)  BUN 8 - 27 mg/dL 23 17 28(H)  Creatinine 0.76 - 1.27 mg/dL 1.09 1.16 1.20  BUN/Creat Ratio 10 - 24 21 - -  Sodium 134 - 144 mmol/L 145(H) 138 136  Potassium 3.5 - 5.2 mmol/L 4.5 3.4(L) 4.7  Chloride 96 - 106 mmol/L 107(H) 105 98(L)  CO2 20 - 29 mmol/L 17(L) 24 -  Calcium 8.6 - 10.2 mg/dL 9.6 8.6(L) -     Hepatic Function Latest Ref Rng & Units 08/04/2017 11/08/2016  Total Protein 6.5 - 8.1  g/dL 6.6 6.6  Albumin 3.5 - 5.0  g/dL 4.1 4.2  AST 15 - 41 U/L 36 31  ALT 17 - 63 U/L 55 30  Alk Phosphatase 38 - 126 U/L 35(L) 27(L)  Total Bilirubin 0.3 - 1.2 mg/dL 0.7 0.5  Bilirubin, Direct 0.1 - 0.5 mg/dL - 0.2    CBC Latest Ref Rng & Units 03/16/2018 08/05/2017 08/04/2017  WBC 3.4 - 10.8 x10E3/uL 6.5 9.5 -  Hemoglobin 13.0 - 17.7 g/dL 15.4 13.5 12.6(L)  Hematocrit 37.5 - 51.0 % 44.0 37.7(L) 37.0(L)  Platelets 150 - 379 x10E3/uL 208 218 -   Lab Results  Component Value Date   MCV 87 03/16/2018   MCV 83.8 08/05/2017   MCV 83.6 08/04/2017   No results found for: TSH Lab Results  Component Value Date   HGBA1C 9.8 (H) 08/05/2017     BNP No results found for: BNP  ProBNP No results found for: PROBNP   Lipid Panel     Component Value Date/Time   CHOL 204 (H) 08/04/2017 1005   TRIG 370 (H) 08/04/2017 1005   HDL 29 (L) 08/04/2017 1005   CHOLHDL 7.0 08/04/2017 1005   VLDL 74 (H) 08/04/2017 1005   LDLCALC 101 (H) 08/04/2017 1005     RADIOLOGY: No results found.   Additional studies/ records that were reviewed today include:   Emergent cardiac catheterization/PCI 08/04/2017: Conclusion     LV end diastolic pressure is mildly elevated.  Patrick left ventricular systolic function is normal.  A STENT RESOLUTE [id number] drug eluting stent was successfully placed.  Prox LAD to Mid LAD lesion, 100 %stenosed.  Post intervention, there is a 0% residual stenosis.   Acute ST segment elevation myocardial infarction secondary to total occlusion of Patrick LAD immediately after Patrick takeoff of Patrick first diagonal and septal perforating artery.  There was TIMI 0 flow.  Mild luminal irregularity of Patrick left circumflex vessel without significant obstructive stenoses.  Dominant normal RCA.  Successful PCI to Patrick totally occluded LAD which resulted in a long segment of occlusion and ultimately treated with PTCA and DES stenting with a Resolute onyx 3.038 mm stent postdilated to  3.25 mm with 100% occlusion being reduced to 0% and TIMI 0 flow being improved to TIMI 3 flow.  Transient heart block treated with IV atropine.  Preserved global LV contractility with subtle apical inferior focal hypocontractility.  Patrick [name] presented to [facility] emergency room at ~ 8:45 AM.  He presented to [facility] catheterization lab at approximately 10:45 AM.  During balloon time from presentation to [facility] catheterization laboratory was ~ 20 minutes.  RECOMMENDATION: Patrick [name] will continue with dual antiplatelet therapy for minimum of a year.  He will be started on high potency statin therapy, low-dose beta blocker therapy, with ultimate ACE inhibition.  Metformin will be held for 48 hours postcontrast.    03/10/2018 Nuclear Study Highlights     Patrick left ventricular ejection fraction is mildly decreased (45-54%).  Nuclear stress EF: 45%.  There was no ST segment deviation noted during stress.  Findings consistent with ischemia.  This is a high risk study.   TID 1.3 Normalization artifact likely but appears to be both inferior and anterior ischemia Diffuse hypokinesis EF 45%     ASSESSMENT:    1. Abnormal nuclear stress test   2. Pre-procedure lab exam   3. Coronary artery disease involving native coronary artery of native heart with angina pectoris ([place])   4. Hyperlipidemia LDL goal <70   5. Essential hypertension   6. Type 2  diabetes mellitus with complication, without long-term current use of insulin ([place])   7. Gastroesophageal reflux disease without esophagitis      PLAN:  Patrick [name] is a 62 year old gentleman who has a history of hypertension, hyperlipidemia, and diabetes mellitus. On 08/04/2017 he presented with an acute anterior wall ST segment elevation myocardial infarctions and was found to have total occlusion of his LAD after Patrick first diagonal vessel.  He was found to have a long occluded segment which was successfully treated with DES  stenting with a 3.038 mm Resolute DES stent.  Subsequently, he had done well.  He has been walking on a daily basis. When I saw for initial posthospital evaluation his BMI was mildly increased at 31.36.  Weight reduction was recommended.  He also has mild central adiposity and discussed with him that this type of fat typically is associated with greater levels of inflammation. His blood pressure was elevated and I titrated his medications.  Blood pressure today is significantly elevated at 170/88 on repeat by me, despite taking metoprolol 37.5 mg in Patrick morning and 25 mg at night,and losartan 100 mg daily. Marland Kitchen  He did recently developed heartburn-like sensation and I reviewed his nuclear scan with him thoroughly today in Patrick office.  He presents study suggests Patrick possibility of ischemia involving both Patrick anterior and inferior wall.  EF was 45% and there was diffuse hypokinesis.  With his blood pressure elevation, and nuclear study suggestive of ischemia, I am adding amlodipine 5 mg, which she'll be beneficial both for blood pressure elevation as well as improve coronary perfusion am further titrating metoprolol to 37.5 mg twice a day.  I have recommended definitive cardiac catheterization.  He has continued to be on aspirin/brilinta for antiplatelet therapy.  He is on atorvastatin 40 mg and over-Patrick-counter fish oil.  I again discussed with him Patrick Reduce-it trial and particularly with his elevated triglycerides at 370, have recommended for Vascepa 2 g twice a day.  He is diabetic on glimepiride and metformin.  He has GERD on pantoprazole. I have reviewed Patrick risks, indications, and alternatives to cardiac catheterization, possible angioplasty, and stenting with Patrick [name]. Risks include but are not limited to bleeding, infection, vascular injury, stroke, myocardial infection, arrhythmia, kidney injury, radiation-related injury in Patrick case of prolonged fluoroscopy use, emergency cardiac surgery, and death. Patrick  [name] understands Patrick risks of serious complication is 1-2 in 1517 with diagnostic cardiac cath and 1-2% or less with angioplasty/stenting. Catheterization will be scheduled for Monday 03/21/18.   tedication Adjustments/Labs and Tests Ordered: Current medicines are reviewed at length with Patrick [name] today.  Concerns regarding medicines are outlined above.  Medication changes, Labs and Tests ordered today are listed in Patrick [name] Instructions below. [name] Instructions  Medication Instructions:  START Vascepa 2 g two times daily  INCREASE metoprolol tartrate to 37.5 mg two times daily  START amlodipine 5 mg daily  Labwork: TODAY (BMET, CBC)  Testing/Procedures: Your physician has requested that you have a cardiac catheterization. Cardiac catheterization is used to diagnose and/or treat various heart conditions. Doctors may recommend this procedure for a number of different reasons. Patrick most common reason is to evaluate chest pain. Chest pain can be a symptom of coronary artery disease (CAD), and cardiac catheterization can show whether plaque is narrowing or blocking your heart's arteries. This procedure is also used to evaluate Patrick valves, as well as measure Patrick blood flow and oxygen levels in different parts of your heart. For further  information please visit [email or web address]. Please follow instruction sheet, as given.  Follow-Up: After cath with Dr. Claiborne Billings  Any Other Special Instructions Will Be Listed Below (If Applicable).     If you need a refill on your cardiac medications before your next appointment, please call your pharmacy.      Signed, Shelva Majestic, MD  03/17/2018 Patrick [name] [address], [place], [place], [place]  [address] Phone: [phone]

## 2018-03-16 NOTE — Patient Instructions (Signed)
Medication Instructions:  START Vascepa 2 g two times daily  INCREASE metoprolol tartrate to 37.5 mg two times daily  START amlodipine 5 mg daily  Labwork: TODAY (BMET, CBC)  Testing/Procedures: Your physician has requested that you have a cardiac catheterization. Cardiac catheterization is used to diagnose and/or treat various heart conditions. Doctors may recommend this procedure for a number of different reasons. The most common reason is to evaluate chest pain. Chest pain can be a symptom of coronary artery disease (CAD), and cardiac catheterization can show whether plaque is narrowing or blocking your heart's arteries. This procedure is also used to evaluate the valves, as well as measure the blood flow and oxygen levels in different parts of your heart. For further information please visit [email or web address]. Please follow instruction sheet, as given.  Follow-Up: After cath with Dr. Tresa EndoKelly  Any Other Special Instructions Will Be Listed Below (If Applicable).     If you need a refill on your cardiac medications before your next appointment, please call your pharmacy.

## 2018-03-17 ENCOUNTER — Encounter: Payer: Self-pay | Admitting: Cardiovascular Disease

## 2018-03-17 ENCOUNTER — Ambulatory Visit: Payer: 59 | Admitting: Adult Health

## 2018-03-17 ENCOUNTER — Telehealth: Payer: Self-pay | Admitting: *Deleted

## 2018-03-17 LAB — BASIC METABOLIC PANEL
BUN / CREAT RATIO: 21 (ref 10–24)
BUN: 23 mg/dL (ref 8–27)
CO2: 17 mmol/L — ABNORMAL LOW (ref 20–29)
Calcium: 9.6 mg/dL (ref 8.6–10.2)
Chloride: 107 mmol/L — ABNORMAL HIGH (ref 96–106)
Creatinine, Ser: 1.09 mg/dL (ref 0.76–1.27)
GFR calc Af Amer: 82 mL/min/{1.73_m2} (ref >59)
GFR calc non Af Amer: 71 mL/min/{1.73_m2} (ref >59)
GLUCOSE: 114 mg/dL — AB (ref 65–99)
Potassium: 4.5 mmol/L (ref 3.5–5.2)
Sodium: 145 mmol/L — ABNORMAL HIGH (ref 134–144)

## 2018-03-17 LAB — CBC
HEMATOCRIT: 44 % (ref 37.5–51.0)
HEMOGLOBIN: 15.4 g/dL (ref 13.0–17.7)
MCH: 30.4 pg (ref 26.6–33.0)
MCHC: 35 g/dL (ref 31.5–35.7)
MCV: 87 fL (ref 79–97)
Platelets: 208 10*3/uL (ref 150–379)
RBC: 5.06 x10E6/uL (ref 4.14–5.80)
RDW: 14.3 % (ref 12.3–15.4)
WBC: 6.5 10*3/uL (ref 3.4–10.8)

## 2018-03-17 NOTE — Telephone Encounter (Signed)
We can prescribe Toviaz 4 mg daily, # 30 with 3 refills.

## 2018-03-17 NOTE — Telephone Encounter (Signed)
Pt is currently not taking deltrol LA as insurance will not cover that either. Insurance letter states that they will cover oxybutynin or toviaz. Please advise.

## 2018-03-17 NOTE — Telephone Encounter (Signed)
Pt contacted pre-catheterization scheduled at [facility] for: Monday March 21, 2018 7:30 AM Verified arrival [facility] and place: [facility] Entr[facility] at: 5:30 AM Nothing to eat or drink after midnight prior to cath. Verified no known allergies.  Hold: Metformin 03/20/18 until 48 hours post cath Glimepiride AM of cath   Except hold medications  AM meds can be  taken pre-cath with sip of water including: ASA 81 mg  Brilinta 90 mg   Confirmed patient has responsible person to drive home post procedure and observe patient for 24 hours: yes

## 2018-03-18 ENCOUNTER — Other Ambulatory Visit: Payer: Self-pay

## 2018-03-18 MED ORDER — FESOTERODINE FUMARATE ER 4 MG PO TB24: 4.0000 mg | ORAL_TABLET | Freq: Every day | ORAL | 3 refills | Status: DC

## 2018-03-18 NOTE — Telephone Encounter (Signed)
Called and spoke w/ pt. Rx for Toviaz 4mg  sent to [facility] in [place].

## 2018-03-20 NOTE — Progress Notes (Signed)
03/01/2018 12:10 AM   Patrick [name] [birth year] [id number]  Referring provider: Lauro Regulus, MD [address] [name] [place], [place] [address]  No chief complaint on file.   HPI: Patient is a 62 year old Caucasian male with BPH, a history of elevated PSA, kidneys stones and OAB who presents today for a Trimix titration.  He is taking Nitrostat and therefore is not a candidate for PDE5i.    He has had difficulty with erections for several months.  He denies any pain with erections or curvatures of erection.  Patient denies any gross hematuria, dysuria or suprapubic/flank pain.  Patient denies any fevers, chills, nausea or vomiting.       PMH: Past Medical History:  Diagnosis Date  . CAD in native artery    a. anterolateral STEMI 07/2017 s/p DES to prox-mid LAD, EF 55%.  Marland Kitchen GERD (gastroesophageal reflux disease)   . Hyperlipidemia   . Hypertension   . Overactive bladder   . Renal calculi   . Uncontrolled diabetes mellitus [facility])     Surgical History: Past Surgical History:  Procedure Laterality Date  . CORONARY/GRAFT ACUTE MI REVASCULARIZATION N/A 08/04/2017   Procedure: Coronary/Graft Acute MI Revascularization;  Surgeon: Lennette Bihari, MD;  Location: [facility] INVASIVE CV LAB;  Service: Cardiovascular;  Laterality: N/A;  . LEFT HEART CATH AND CORONARY ANGIOGRAPHY N/A 08/04/2017   Procedure: LEFT HEART CATH AND CORONARY ANGIOGRAPHY;  Surgeon: Lennette Bihari, MD;  Location: [facility] INVASIVE CV LAB;  Service: Cardiovascular;  Laterality: N/A;  . PROSTATE SURGERY      Home Medications:  Allergies as of 03/01/2018   No Known Allergies     Medication List        Accurate as of 03/01/18 11:59 PM. Always use your most recent med list.          ABC PLUS SENIOR PO Take 1 tablet by mouth daily.   aspirin 81 MG tablet Take 1 tablet (81 mg total) by mouth daily.   atorvastatin 40 MG tablet Commonly known as:  LIPITOR TAKE 1 TABLET(40 MG) BY  MOUTH EVERY EVENING   Coenzyme Q10 200 MG capsule Take 200 mg by mouth daily.   famotidine 40 MG tablet Commonly known as:  PEPCID Take 1 tablet (40 mg total) by mouth daily after lunch.   Fish Oil 1200 MG Caps Take 1,200 mg by mouth daily.   glimepiride 2 MG tablet Commonly known as:  AMARYL Take 2 mg by mouth daily with breakfast.   GLUCOSAMINE 1500 COMPLEX PO Take 1,500 mg by mouth daily.   losartan 100 MG tablet Commonly known as:  COZAAR TAKE 1 TABLET(100 MG) BY MOUTH DAILY   Magnesium 250 MG Tabs Take 250 mg by mouth daily.   Magnesium Citrate 100 MG Tabs Take 100 mg by mouth daily.   metformin 500 MG (OSM) 24 hr tablet Commonly known as:  FORTAMET Take 2,000 mg by mouth 2 (two) times daily with a meal.   metoprolol tartrate 25 MG tablet Commonly known as:  LOPRESSOR Take 37.5 mg (1 1/2 tablets) in the morning and 25 mg (1 tablets) at night   nitroGLYCERIN 0.4 MG SL tablet Commonly known as:  NITROSTAT Place 1 tablet (0.4 mg total) under the tongue every 5 (five) minutes x 3 doses as needed for chest pain.   pantoprazole 40 MG tablet Commonly known as:  PROTONIX Take 40 mg by mouth daily.   Potassium 99 MG  Tabs Take 99 mg by mouth daily.   pyridOXINE 50 MG tablet Commonly known as:  VITAMIN B-6 Take 50 mg by mouth daily.   ticagrelor 90 MG Tabs tablet Commonly known as:  BRILINTA Take 1 tablet (90 mg total) by mouth 2 (two) times daily.   tolterodine 4 MG 24 hr capsule Commonly known as:  DETROL LA Take 1 capsule (4 mg total) by mouth daily.   vitamin C 500 MG tablet Commonly known as:  ASCORBIC ACID Take 500 mg by mouth daily.   vitamin E 400 UNIT capsule Take 400 Units by mouth daily.       Allergies: No Known Allergies  Family History: Family History  Problem Relation Age of Onset  . Heart disease Father   . Heart attack Father     Social History:  reports that he has never smoked. He has never used smokeless tobacco. He reports  that he does not drink alcohol or use drugs.  ROS: UROLOGY Frequent Urination?: No Hard to postpone urination?: No Burning/pain with urination?: No Get up at night to urinate?: No Leakage of urine?: No Urine stream starts and stops?: No Trouble starting stream?: No Do you have to strain to urinate?: No Blood in urine?: No Urinary tract infection?: No Sexually transmitted disease?: No Injury to kidneys or bladder?: No Painful intercourse?: No Weak stream?: No Erection problems?: No Penile pain?: No  Gastrointestinal Nausea?: No Vomiting?: No Indigestion/heartburn?: No Diarrhea?: No Constipation?: No  Constitutional Fever: No Night sweats?: No Weight loss?: No Fatigue?: No  Skin Skin rash/lesions?: No Itching?: No  Eyes Blurred vision?: No Double vision?: No  Ears/Nose/Throat Sore throat?: No Sinus problems?: No  Hematologic/Lymphatic Swollen glands?: No Easy bruising?: No  Cardiovascular Leg swelling?: No Chest pain?: No  Respiratory Cough?: No Shortness of breath?: No  Endocrine Excessive thirst?: No  Musculoskeletal Back pain?: No Joint pain?: No  Neurological Headaches?: No Dizziness?: No  Psychologic Depression?: No Anxiety?: No  Physical Exam: BP (!) 153/90   Pulse 80   Resp 16   Ht 6' (1.829 m)   Wt 230 lb (104.3 kg)   SpO2 96%   BMI 31.19 kg/m   Constitutional: Well nourished. Alert and oriented, No acute distress. HEENT: [place] AT, moist mucus membranes. Trachea midline, no masses. Cardiovascular: No clubbing, cyanosis, or edema. Respiratory: Normal respiratory effort, no increased work of breathing. GI: Abdomen is soft, non tender, non distended, no abdominal masses. Liver and spleen not palpable.  No hernias appreciated.  Stool sample for occult testing is not indicated.   GU: No CVA tenderness.  No bladder fullness or masses.  Patient with circumcised phallus. Urethral meatus is patent.  No penile discharge. No penile  lesions or rashes.  Skin: No rashes, bruises or suspicious lesions. Lymph: No cervical or inguinal adenopathy. Neurologic: Grossly intact, no focal deficits, moving all 4 extremities. Psychiatric: Normal mood and affect.  Laboratory Data: Lab Results  Component Value Date   WBC 6.5 03/16/2018   HGB 15.4 03/16/2018   HCT 44.0 03/16/2018   MCV 87 03/16/2018   PLT 208 03/16/2018    Lab Results  Component Value Date   CREATININE 1.09 03/16/2018    No results found for: PSA  No results found for: TESTOSTERONE  Lab Results  Component Value Date   HGBA1C 9.8 (H) 08/05/2017    No results found for: TSH     Component Value Date/Time   CHOL 204 (H) 08/04/2017 1005   HDL 29 (L)  08/04/2017 1005   CHOLHDL 7.0 08/04/2017 1005   VLDL 74 (H) 08/04/2017 1005   LDLCALC 101 (H) 08/04/2017 1005    Lab Results  Component Value Date   AST 36 08/04/2017   Lab Results  Component Value Date   ALT 55 08/04/2017   No components found for: ALKALINEPHOPHATASE No components found for: BILIRUBINTOTAL  No results found for: ESTRADIOL  Urinalysis    Component Value Date/Time   COLORURINE YELLOW (A) 11/08/2016 0331   APPEARANCEUR Clear 11/23/2017 1531   LABSPEC 1.024 11/08/2016 0331   PHURINE 6.0 11/08/2016 0331   GLUCOSEU Negative 11/23/2017 1531   HGBUR 2+ (A) 11/08/2016 0331   BILIRUBINUR Negative 11/23/2017 1531   KETONESUR TRACE (A) 11/08/2016 0331   PROTEINUR Negative 11/23/2017 1531   PROTEINUR 30 (A) 11/08/2016 0331   NITRITE Negative 11/23/2017 1531   NITRITE NEGATIVE 11/08/2016 0331   LEUKOCYTESUR Negative 11/23/2017 1531    I have reviewed the labs.  Procedure Patient's right corpus cavernosum is identified.  An area near the base of the penis is cleansed with rubbing alcohol.  Careful to avoid the dorsal vein, 3 mcg of Trimix (papaverine 30 mg, phentolamine 1 mg and prostaglandin E1 10 mcg, Lot # [id number]  exp # 03/03/2018) is injected at a 90 degree angle into  the right corpus cavernosum near the base of the penis.  Patient experienced a very firm erection in 15 minutes.          Assessment & Plan:    1. Erectile dysfunction Patient will a successful erection with the injection of 3 mcg of Trimix Will call in a prescription to [facility] pharmacy for the patient for 5 mcg for self titration  Return for keep appointment on 03/22/2018.  These notes generated with voice recognition software. I apologize for typographical errors.  Michiel CowboySHANNON Lexus Shampine, PA-C  [facility] [address], [address] [place], [place] [address] [phone]

## 2018-03-21 ENCOUNTER — Ambulatory Visit (HOSPITAL_COMMUNITY)
Admission: RE | Disposition: A | Discharge status: Home / Self Care | Payer: Self-pay | Source: Ambulatory Visit | Attending: Cardiovascular Disease

## 2018-03-21 ENCOUNTER — Encounter (HOSPITAL_COMMUNITY): Payer: Self-pay | Admitting: Cardiovascular Disease

## 2018-03-21 ENCOUNTER — Ambulatory Visit (HOSPITAL_COMMUNITY)
Admission: RE | Admit: 2018-03-21 | Discharge: 2018-03-21 | Disposition: A | Discharge status: Home / Self Care | Payer: 59 | Source: Ambulatory Visit | Attending: Cardiovascular Disease | Admitting: Cardiovascular Disease

## 2018-03-21 ENCOUNTER — Telehealth: Payer: Self-pay | Admitting: Urology

## 2018-03-21 DIAGNOSIS — Z6830 Body mass index (BMI) 30.0-30.9, adult: Secondary | ICD-10-CM | POA: Diagnosis not present

## 2018-03-21 DIAGNOSIS — N3281 Overactive bladder: Secondary | ICD-10-CM | POA: Insufficient documentation

## 2018-03-21 DIAGNOSIS — E669 Obesity, unspecified: Secondary | ICD-10-CM | POA: Diagnosis not present

## 2018-03-21 DIAGNOSIS — Z7984 Long term (current) use of oral hypoglycemic drugs: Secondary | ICD-10-CM | POA: Insufficient documentation

## 2018-03-21 DIAGNOSIS — I10 Essential (primary) hypertension: Secondary | ICD-10-CM | POA: Diagnosis not present

## 2018-03-21 DIAGNOSIS — E119 Type 2 diabetes mellitus without complications: Secondary | ICD-10-CM | POA: Diagnosis not present

## 2018-03-21 DIAGNOSIS — I252 Old myocardial infarction: Secondary | ICD-10-CM | POA: Insufficient documentation

## 2018-03-21 DIAGNOSIS — K219 Gastro-esophageal reflux disease without esophagitis: Secondary | ICD-10-CM | POA: Insufficient documentation

## 2018-03-21 DIAGNOSIS — I25119 Atherosclerotic heart disease of native coronary artery with unspecified angina pectoris: Secondary | ICD-10-CM | POA: Insufficient documentation

## 2018-03-21 DIAGNOSIS — Z955 Presence of coronary angioplasty implant and graft: Secondary | ICD-10-CM | POA: Diagnosis not present

## 2018-03-21 DIAGNOSIS — Z8249 Family history of ischemic heart disease and other diseases of the circulatory system: Secondary | ICD-10-CM | POA: Diagnosis not present

## 2018-03-21 DIAGNOSIS — R9439 Abnormal result of other cardiovascular function study: Secondary | ICD-10-CM

## 2018-03-21 DIAGNOSIS — Z7982 Long term (current) use of aspirin: Secondary | ICD-10-CM | POA: Insufficient documentation

## 2018-03-21 DIAGNOSIS — E785 Hyperlipidemia, unspecified: Secondary | ICD-10-CM | POA: Diagnosis not present

## 2018-03-21 HISTORY — PX: LEFT HEART CATH AND CORONARY ANGIOGRAPHY: CATH118249

## 2018-03-21 LAB — PROTIME-INR
INR: 0.96
PROTHROMBIN TIME: 12.6 s (ref 11.4–15.2)

## 2018-03-21 LAB — GLUCOSE, CAPILLARY: GLUCOSE-CAPILLARY: 167 mg/dL — AB (ref 65–99)

## 2018-03-21 SURGERY — LEFT HEART CATH AND CORONARY ANGIOGRAPHY
Anesthesia: LOCAL

## 2018-03-21 MED ORDER — SODIUM CHLORIDE 0.9 % IV SOLN: INTRAVENOUS | Status: DC

## 2018-03-21 MED ORDER — IOHEXOL 350 MG/ML SOLN
INTRAVENOUS | Status: DC | PRN
  Administered 2018-03-21: 80 mL via INTRA_ARTERIAL

## 2018-03-21 MED ORDER — HEPARIN (PORCINE) IN NACL 2-0.9 UNIT/ML-% IJ SOLN
INTRAMUSCULAR | Status: DC | PRN
  Administered 2018-03-21: 10 mL via INTRA_ARTERIAL

## 2018-03-21 MED ORDER — ASPIRIN 81 MG PO CHEW: 81.0000 mg | CHEWABLE_TABLET | Freq: Every day | ORAL | Status: DC

## 2018-03-21 MED ORDER — VERAPAMIL HCL 2.5 MG/ML IV SOLN
INTRAVENOUS | Status: AC
  Filled 2018-03-21: qty 2

## 2018-03-21 MED ORDER — FENTANYL CITRATE (PF) 100 MCG/2ML IJ SOLN
INTRAMUSCULAR | Status: DC | PRN
  Administered 2018-03-21: 50 ug via INTRAVENOUS

## 2018-03-21 MED ORDER — SODIUM CHLORIDE 0.9 % IV SOLN: 250.0000 mL | INTRAVENOUS | Status: DC | PRN

## 2018-03-21 MED ORDER — SODIUM CHLORIDE 0.9 % WEIGHT BASED INFUSION: 1.0000 mL/kg/h | INTRAVENOUS | Status: DC

## 2018-03-21 MED ORDER — TICAGRELOR 90 MG PO TABS: 90.0000 mg | ORAL_TABLET | ORAL | Status: DC

## 2018-03-21 MED ORDER — HEPARIN SODIUM (PORCINE) 1000 UNIT/ML IJ SOLN
INTRAMUSCULAR | Status: DC | PRN
  Administered 2018-03-21: 5000 [IU] via INTRAVENOUS

## 2018-03-21 MED ORDER — SODIUM CHLORIDE 0.9% FLUSH: 3.0000 mL | Freq: Two times a day (BID) | INTRAVENOUS | Status: DC

## 2018-03-21 MED ORDER — MIDAZOLAM HCL 2 MG/2ML IJ SOLN
INTRAMUSCULAR | Status: DC | PRN
  Administered 2018-03-21: 2 mg via INTRAVENOUS

## 2018-03-21 MED ORDER — DIAZEPAM 5 MG PO TABS: 5.0000 mg | ORAL_TABLET | Freq: Four times a day (QID) | ORAL | Status: DC | PRN

## 2018-03-21 MED ORDER — LIDOCAINE HCL 1 % IJ SOLN
INTRAMUSCULAR | Status: AC
  Filled 2018-03-21: qty 20

## 2018-03-21 MED ORDER — ONDANSETRON HCL 4 MG/2ML IJ SOLN: 4.0000 mg | Freq: Four times a day (QID) | INTRAMUSCULAR | Status: DC | PRN

## 2018-03-21 MED ORDER — MIDAZOLAM HCL 2 MG/2ML IJ SOLN
INTRAMUSCULAR | Status: AC
  Filled 2018-03-21: qty 2

## 2018-03-21 MED ORDER — ASPIRIN 81 MG PO CHEW: 81.0000 mg | CHEWABLE_TABLET | ORAL | Status: DC

## 2018-03-21 MED ORDER — TICAGRELOR 90 MG PO TABS: 90.0000 mg | ORAL_TABLET | Freq: Two times a day (BID) | ORAL | Status: DC

## 2018-03-21 MED ORDER — ACETAMINOPHEN 325 MG PO TABS: 650.0000 mg | ORAL_TABLET | ORAL | Status: DC | PRN

## 2018-03-21 MED ORDER — SODIUM CHLORIDE 0.9 % WEIGHT BASED INFUSION
3.0000 mL/kg/h | INTRAVENOUS | Status: AC
  Administered 2018-03-21: 3 mL/kg/h via INTRAVENOUS

## 2018-03-21 MED ORDER — HEPARIN (PORCINE) IN NACL 2-0.9 UNIT/ML-% IJ SOLN
INTRAMUSCULAR | Status: AC
  Filled 2018-03-21: qty 1000

## 2018-03-21 MED ORDER — FENTANYL CITRATE (PF) 100 MCG/2ML IJ SOLN
INTRAMUSCULAR | Status: AC
  Filled 2018-03-21: qty 2

## 2018-03-21 MED ORDER — LIDOCAINE HCL (PF) 1 % IJ SOLN
INTRAMUSCULAR | Status: DC | PRN
  Administered 2018-03-21: 2 mL

## 2018-03-21 MED ORDER — HEPARIN (PORCINE) IN NACL 2-0.9 UNIT/ML-% IJ SOLN
INTRAMUSCULAR | Status: AC | PRN
  Administered 2018-03-21 (×2): 500 mL via INTRA_ARTERIAL

## 2018-03-21 MED ORDER — HEPARIN SODIUM (PORCINE) 1000 UNIT/ML IJ SOLN
INTRAMUSCULAR | Status: AC
  Filled 2018-03-21: qty 1

## 2018-03-21 MED ORDER — SODIUM CHLORIDE 0.9% FLUSH: 3.0000 mL | INTRAVENOUS | Status: DC | PRN

## 2018-03-21 SURGICAL SUPPLY — 14 items
BAND ZEPHYR COMPRESS 30 LONG (HEMOSTASIS) ×2 IMPLANT
CATH INFINITI 5FR ANG PIGTAIL (CATHETERS) ×2 IMPLANT
CATH OPTITORQUE TIG 4.0 5F (CATHETERS) ×2 IMPLANT
COVER PRB 48X5XTLSCP FOLD TPE (BAG) ×1 IMPLANT
COVER PROBE 5X48 (BAG) ×1
GUIDEWIRE INQWIRE 1.5J.035X260 (WIRE) ×1 IMPLANT
INQWIRE 1.5J .035X260CM (WIRE) ×2
KIT HEART LEFT (KITS) ×2 IMPLANT
NEEDLE PERC 21GX4CM (NEEDLE) ×2 IMPLANT
PACK CARDIAC CATHETERIZATION (CUSTOM PROCEDURE TRAY) ×2 IMPLANT
SHEATH RAIN RADIAL 21G 6FR (SHEATH) ×2 IMPLANT
SYR MEDRAD MARK V 150ML (SYRINGE) ×2 IMPLANT
TRANSDUCER W/STOPCOCK (MISCELLANEOUS) ×2 IMPLANT
TUBING CIL FLEX 10 FLL-RA (TUBING) ×2 IMPLANT

## 2018-03-21 NOTE — Telephone Encounter (Signed)
Please call a prescription in for this patient for Trimix to [facility] pharmacy.  Does is 5 mcg.

## 2018-03-21 NOTE — Progress Notes (Signed)
03/22/2018 9:36 AM   Patrick [name] Noori Apr 09, 1956 [id number]  Referring provider: Lauro RegulusAnderson, Marshall W, MD [address] [facility] [place], [place] [id number]  Chief Complaint  Patient presents with  . Urinary Frequency  . Elevated PSA    HPI: 62 year old male with a history of elevated PSA, dysfunctional voiding, and erectile dysfunction who returns today for routine follow-up.  Since last visit, he underwent intracavernosal injection teaching with Michiel CowboyShannon McGowan.  His dose was titrated up to 5 mcg of compounded tri-mix.  PD 5 inhibitors are contraindicated due to use of Nitrostat.  He is tried this once at home by himself with good response.  In terms of urinary symptoms, he underwent urodynamics essentially demonstrating dysfunctional voiding with EMG activity during voiding.  He also had instability notable.  No evidence of outlet obstruction.  He since undergone physical therapy evaluation and treatment.   He was seen for a total of 7 visits and will likely continue this for a total of 12 sessions.  Physical therapy notes note significant improvement and progress.  He notes today mild to moderate improvement in his urinary frequency and urgency.  He appreciates that it is partially mental.    He continues to alternate anticholinergics due to failure after a period of time.  He has tried oxybutynin, toviaz, detrol and mybetriq with refractory symptoms/ inadequate response.  He did best with mybetriq due to less side effects.  Since her last visit, he never filled the Toviaz as he already had some previous Detrol and recently found an old prescription of oxybutynin after he completed the Detrol.  He also has a history of elevated PSA.   We have received records from Dr. Amada JupiterWolff's office dating back to 2004 which time his PSA was 6.8.  It appears that he had chest biopsy at this time.  He also had a biopsy in 2008 when his PSA 6.3.  His PSA appears to be fluctuating ranging  anywhere from 1.6 as high as 6.8.  We do not have the results of his previous biopsies but he reports that these are negative.  His most recent PSA by Dr. Evelene CroonWolff was 04/02/2017 at 3.1.  He has had a cardiac cath yesterday which was good per his report.   PMH: Past Medical History:  Diagnosis Date  . CAD in native artery    a. anterolateral STEMI 07/2017 s/p DES to prox-mid LAD, EF 55%.  Marland Kitchen. GERD (gastroesophageal reflux disease)   . Hyperlipidemia   . Hypertension   . Overactive bladder   . Renal calculi   . Uncontrolled diabetes mellitus [facility](HCC)     Surgical History: Past Surgical History:  Procedure Laterality Date  . CORONARY/GRAFT ACUTE MI REVASCULARIZATION N/A 08/04/2017   Procedure: Coronary/Graft Acute MI Revascularization;  Surgeon: Lennette BihariKelly, Thomas A, MD;  [facility];  Service: Cardiovascular;  Laterality: N/A;  . LEFT HEART CATH AND CORONARY ANGIOGRAPHY N/A 08/04/2017   Procedure: LEFT HEART CATH AND CORONARY ANGIOGRAPHY;  Surgeon: Lennette BihariKelly, Thomas A, MD;  Location: [facility] INVASIVE CV LAB;  Service: Cardiovascular;  Laterality: N/A;  . LEFT HEART CATH AND CORONARY ANGIOGRAPHY N/A 03/21/2018   Procedure: LEFT HEART CATH AND CORONARY ANGIOGRAPHY;  Surgeon: Lennette BihariKelly, Thomas A, MD;  Location: [facility] INVASIVE CV LAB;  Service: Cardiovascular;  Laterality: N/A;  . PROSTATE SURGERY      Home Medications:  Allergies as of 03/22/2018   No Known Allergies     Medication List  Accurate as of 03/22/18  9:36 AM. Always use your most recent med list.          ABC PLUS SENIOR PO Take 1 tablet by mouth daily.   amLODipine 5 MG tablet Commonly known as:  NORVASC Take 1 tablet (5 mg total) by mouth daily.   aspirin 81 MG tablet Take 1 tablet (81 mg total) by mouth daily.   atorvastatin 40 MG tablet Commonly known as:  LIPITOR TAKE 1 TABLET(40 MG) BY MOUTH EVERY EVENING   Coenzyme Q10 200 MG capsule Take 200 mg by mouth daily.   famotidine 40 MG tablet Commonly known as:   PEPCID Take 1 tablet (40 mg total) by mouth daily after lunch.   fesoterodine 4 MG Tb24 tablet Commonly known as:  TOVIAZ Take 1 tablet (4 mg total) by mouth daily.   glimepiride 2 MG tablet Commonly known as:  AMARYL Take 2 mg by mouth daily with breakfast.   GLUCOSAMINE 1500 COMPLEX PO Take 1,500 mg by mouth daily.   Icosapent Ethyl 1 g Caps Commonly known as:  VASCEPA Take 2 capsules (2 g total) by mouth 2 (two) times daily.   losartan 100 MG tablet Commonly known as:  COZAAR TAKE 1 TABLET(100 MG) BY MOUTH DAILY   Magnesium 250 MG Tabs Take 250 mg by mouth daily.   Magnesium Citrate 100 MG Tabs Take 100 mg by mouth daily.   metformin 500 MG (OSM) 24 hr tablet Commonly known as:  FORTAMET Take 2,000 mg by mouth 2 (two) times daily with a meal.   metoprolol tartrate 25 MG tablet Commonly known as:  LOPRESSOR Take 1.5 tablets (37.5 mg total) by mouth 2 (two) times daily.   nitroGLYCERIN 0.4 MG SL tablet Commonly known as:  NITROSTAT Place 1 tablet (0.4 mg total) under the tongue every 5 (five) minutes x 3 doses as needed for chest pain.   pantoprazole 40 MG tablet Commonly known as:  PROTONIX Take 40 mg by mouth daily.   Potassium 99 MG Tabs Take 99 mg by mouth daily.   pyridOXINE 50 MG tablet Commonly known as:  VITAMIN B-6 Take 50 mg by mouth daily.   ticagrelor 90 MG Tabs tablet Commonly known as:  BRILINTA Take 1 tablet (90 mg total) by mouth 2 (two) times daily.   vitamin C 500 MG tablet Commonly known as:  ASCORBIC ACID Take 500 mg by mouth daily.   vitamin E 400 UNIT capsule Take 400 Units by mouth daily.       Allergies: No Known Allergies  Family History: Family History  Problem Relation Age of Onset  . Heart disease Father   . Heart attack Father     Social History:  reports that he has never smoked. He has never used smokeless tobacco. He reports that he does not drink alcohol or use drugs.  ROS: UROLOGY Frequent Urination?:  Yes Hard to postpone urination?: Yes Burning/pain with urination?: No Get up at night to urinate?: Yes Leakage of urine?: Yes Urine stream starts and stops?: Yes Trouble starting stream?: No Do you have to strain to urinate?: No Blood in urine?: No Urinary tract infection?: No Sexually transmitted disease?: No Injury to kidneys or bladder?: No Painful intercourse?: No Weak stream?: No Erection problems?: Yes Penile pain?: No  Gastrointestinal Nausea?: No Vomiting?: No Indigestion/heartburn?: No Diarrhea?: No Constipation?: No  Constitutional Fever: No Night sweats?: No Weight loss?: No Fatigue?: No  Skin Skin rash/lesions?: No Itching?: No  Eyes Blurred vision?:  No Double vision?: No  Ears/Nose/Throat Sore throat?: No Sinus problems?: No  Hematologic/Lymphatic Swollen glands?: No Easy bruising?: No  Cardiovascular Leg swelling?: No Chest pain?: No  Respiratory Cough?: No Shortness of breath?: No  Endocrine Excessive thirst?: No  Musculoskeletal Back pain?: No Joint pain?: No  Neurological Headaches?: No Dizziness?: No  Psychologic Depression?: No Anxiety?: No  Physical Exam: BP (!) 144/81   Pulse 64   Ht 6' (1.829 m)   Wt 230 lb (104.3 kg)   BMI 31.19 kg/m   Constitutional:  Alert and oriented, No acute distress. HEENT: [place] AT, moist mucus membranes.  Trachea midline, no masses. Cardiovascular: No clubbing, cyanosis, or edema. Respiratory: Normal respiratory effort, no increased work of breathing. Skin: No rashes, bruises or suspicious lesions. Neurologic: Grossly intact, no focal deficits, moving all 4 extremities. Psychiatric: Normal mood and affect.  Laboratory Data: Lab Results  Component Value Date   WBC 6.5 03/16/2018   HGB 15.4 03/16/2018   HCT 44.0 03/16/2018   MCV 87 03/16/2018   PLT 208 03/16/2018    Lab Results  Component Value Date   CREATININE 1.09 03/16/2018     Lab Results  Component Value Date    HGBA1C 9.8 (H) 08/05/2017    Urinalysis N/a  Pertinent Imaging: N/a  Assessment & Plan:    1. Dysfunctional voiding of urine Continue to work with PT Improving  2. Urinary frequency Tried and failed multiple previous anticholinergics Mybetriq denied by insurance-we will work on appeal the future Given additional samples of Toviaz today 8 mg x 1 month, prescription at pharmacy He will let us know how the Gala Murdoch is working after 1 month trial  2. Combined arterial insufficiency and corporo-venous occlusive erectile dysfunction Doing well on intracavernosal injections  3. History of elevated PSA PSA today Rectal exam up to date as of 11/2017, enlarged without nodules  Return in about 6 months (around 09/21/2018) for IPSS/ PVR/ UA.  Vanna Scotland, MD  [facility] [address] [place], [place] [address] [phone]

## 2018-03-21 NOTE — Discharge Instructions (Signed)

## 2018-03-21 NOTE — Interval H&P Note (Signed)
Cath Lab Visit (complete for each Cath Lab visit)  Clinical Evaluation Leading to the Procedure:   ACS: No.  Non-ACS:    Anginal Classification: CCS III  Anti-ischemic medical therapy: Minimal Therapy (1 class of medications)  Non-Invasive Test Results: High-risk stress test findings: cardiac mortality >3%/year  Prior CABG: No previous CABG      History and Physical Interval Note:  03/21/2018 9:23 AM  Valentina ShaggyAlan L Mack  has presented today for surgery, with the diagnosis of abnormal stress test  The various methods of treatment have been discussed with the patient and family. After consideration of risks, benefits and other options for treatment, the patient has consented to  Procedure(s): LEFT HEART CATH AND CORONARY ANGIOGRAPHY (N/A) as a surgical intervention .  The patient's history has been reviewed, patient examined, no change in status, stable for surgery.  I have reviewed the patient's chart and labs.  Questions were answered to the patient's satisfaction.     Nicki Guadalajarahomas Kelly

## 2018-03-22 ENCOUNTER — Ambulatory Visit (INDEPENDENT_AMBULATORY_CARE_PROVIDER_SITE_OTHER): Payer: 59 | Admitting: Urology

## 2018-03-22 ENCOUNTER — Encounter: Payer: Self-pay | Admitting: Urology

## 2018-03-22 VITALS — BP 144/81 | HR 64 | Ht 72.0 in | Wt 230.0 lb

## 2018-03-22 DIAGNOSIS — N5203 Combined arterial insufficiency and corporo-venous occlusive erectile dysfunction: Secondary | ICD-10-CM

## 2018-03-22 DIAGNOSIS — R350 Frequency of micturition: Secondary | ICD-10-CM | POA: Diagnosis not present

## 2018-03-22 DIAGNOSIS — N398 Other specified disorders of urinary system: Secondary | ICD-10-CM

## 2018-03-22 DIAGNOSIS — Z87898 Personal history of other specified conditions: Secondary | ICD-10-CM | POA: Diagnosis not present

## 2018-03-22 MED FILL — Heparin Sodium (Porcine) 2 Unit/ML in Sodium Chloride 0.9%: INTRAMUSCULAR | Qty: 1000 | Status: AC

## 2018-03-22 MED FILL — Lidocaine HCl Local Inj 1%: INTRAMUSCULAR | Qty: 20 | Status: AC

## 2018-03-22 NOTE — Telephone Encounter (Signed)
Request faxed to [facility]

## 2018-03-23 ENCOUNTER — Telehealth: Payer: Self-pay

## 2018-03-23 LAB — PSA: Prostate Specific Ag, Serum: 3.5 ng/mL (ref 0.0–4.0)

## 2018-03-23 NOTE — Telephone Encounter (Signed)
-----   Message from Vanna ScotlandAshley Brandon, MD sent at 03/23/2018  3:54 PM EDT ----- PSA is relatively stable, known history of fluctuation.  We will continue to follow this annually.  Vanna ScotlandAshley Brandon, MD

## 2018-03-23 NOTE — Telephone Encounter (Signed)
Letter sent.

## 2018-03-25 ENCOUNTER — Telehealth: Payer: Self-pay | Admitting: Cardiovascular Disease

## 2018-03-25 NOTE — Telephone Encounter (Signed)
New Message    Patient recently had a heart cath on Monday. He wants to know does he need to follow up with Dr. Tresa EndoKelly anytime soon.   Pt c/o medication issue:  1. Name of Medication: Vascepa  2. How are you currently taking this medication (dosage and times per day)?   3. Are you having a reaction (difficulty breathing--STAT)? 4. What is your medication issue? Patient states that the medication needs a prior authorization. Please call to discuss.

## 2018-03-25 NOTE — Telephone Encounter (Signed)
Spoke to patient, patient aware appeal has been faxed and will update when notified of decision.  Follow up scheduled with Dr. Tresa EndoKelly 5/6 at 1140 am

## 2018-03-25 NOTE — Telephone Encounter (Signed)
Ok to follow up in ~1 month per Dr. Tresa EndoKelly.   Appeal faxed this week for Vascepa, awaiting response.    Left message to call back

## 2018-03-29 ENCOUNTER — Other Ambulatory Visit: Payer: Self-pay | Admitting: *Deleted

## 2018-03-29 MED ORDER — ICOSAPENT ETHYL 1 G PO CAPS: 2.0000 g | ORAL_CAPSULE | Freq: Two times a day (BID) | ORAL | 3 refills | Status: DC

## 2018-03-31 ENCOUNTER — Ambulatory Visit: Payer: 59 | Admitting: Physical Therapy

## 2018-04-01 ENCOUNTER — Encounter: Payer: Self-pay | Admitting: *Deleted

## 2018-04-12 ENCOUNTER — Other Ambulatory Visit: Payer: Self-pay

## 2018-04-19 ENCOUNTER — Ambulatory Visit: Payer: 59 | Attending: Urology | Admitting: Physical Therapy

## 2018-04-19 DIAGNOSIS — R278 Other lack of coordination: Secondary | ICD-10-CM | POA: Diagnosis present

## 2018-04-19 DIAGNOSIS — R29898 Other symptoms and signs involving the musculoskeletal system: Secondary | ICD-10-CM | POA: Diagnosis present

## 2018-04-19 NOTE — Therapy (Addendum)
[place] MAIN [facility] SERVICES [address] [place], [place], [address] Phone: [phone]   Fax:  [phone]  Physical Therapy Treatment / Discharge Summary   Patient Details  Name: Patrick [name] MRN: [id number] Date of Birth: November 07, 1956 Referring Provider: Erlene Quan   Encounter Date: 04/19/2018  PT End of Session - 04/19/18 2314    Visit Number  8    Date for PT Re-Evaluation  04/06/18    PT Start Time  0805    PT Stop Time  0900    PT Time Calculation (min)  55 min    Activity Tolerance  Patient tolerated treatment well;No increased pain    Behavior During Therapy  WFL for tasks assessed/performed       Past Medical History:  Diagnosis Date  . CAD in native artery    a. anterolateral STEMI 07/2017 s/p DES to prox-mid LAD, EF 55%.  Marland Kitchen GERD (gastroesophageal reflux disease)   . Hyperlipidemia   . Hypertension   . Overactive bladder   . Renal calculi   . Uncontrolled diabetes mellitus ([place])     Past Surgical History:  Procedure Laterality Date  . CORONARY/GRAFT ACUTE MI REVASCULARIZATION N/A 08/04/2017   Procedure: Coronary/Graft Acute MI Revascularization;  Surgeon: Troy Sine, MD;  Location: [place] [facility];  Service: Cardiovascular;  Laterality: N/A;  . LEFT HEART CATH AND CORONARY ANGIOGRAPHY N/A 08/04/2017   Procedure: LEFT HEART CATH AND CORONARY ANGIOGRAPHY;  Surgeon: Troy Sine, MD;  Location: [place] [facility];  Service: Cardiovascular;  Laterality: N/A;  . LEFT HEART CATH AND CORONARY ANGIOGRAPHY N/A 03/21/2018   Procedure: LEFT HEART CATH AND CORONARY ANGIOGRAPHY;  Surgeon: Troy Sine, MD;  Location: [place] [facility];  Service: Cardiovascular;  Laterality: N/A;  . PROSTATE SURGERY      There were no vitals filed for this visit.  Subjective Assessment - 04/19/18 0808    Subjective  Pt reported he had sore throat and fever 2 weeks ago and was put on antibiotics. But he still feels like his lungs are  obstructed and has SOB.     Pertinent History  Pt has undergone thermal treatment to shrink prostate 7 years ago.  Sedentatry at work.  Pt walks 3.5 miles across 5-6 days a week. Heart attack 07/2017. 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 This was the schedule for 5-6 days per week for 7 years.     Patient Stated Goals  reduce the number of times going to the toilet daily          [facility] PT Assessment - 04/19/18 2315      Strength   Overall Strength Comments  hip abd R 3-/5, L 3+/5 ,  hip ext L 4/5, R 3+/5             Treatment:  Reassessed goals, reviewed HEP, discussed continuum of care with cardiac rehab program for overall strengthening and maintenance of health / prevention, discussed PT to communicate with cardiologist re: sleep study recommendation 2/2 to pt's increased risk for OSA with nocturia , heart attack, sedentary work                     PT Long Term Goals - 04/19/18 0811      PT LONG TERM GOAL #1   Title  Pt will report decreased nocturia from 3-4 x  night to < 2-3 x night in order to improve sleep     Time  12    Period  Weeks    Status  Achieved      PT LONG TERM GOAL #2   Title  Pt will report decreased urinary frequency of 2-3 x/hr  to 1-2 x / hr during an action movie in order to enjoy his hobby    Time  10    Period  Weeks    Status  Achieved      PT LONG TERM GOAL #3   Title  Pt will notice no urge to urinate when standing up after a meeting in order to work effectively (4/30: "somewhat better")     Time  8    Period  Weeks    Status  Partially Met      PT LONG TERM GOAL #4   Title  Pt will decrease his ZUNG anxiety score from  30% to < 25 % in order to decrease urinary urgency and frequency  (2/26: 29%, 32% )     Time  12    Period  Weeks    Status  Not Met      PT LONG TERM GOAL #5   Title  Pt will report improved bladder irritant to water ratio in order to  promote bladder health    Time  2    Period  Weeks    Status  Achieved      PT LONG TERM GOAL #6   Title  Pt will decrease his NIH-CPSI score from  37% to <32 % in order to urinate with more control and improve QOL (2/26: 30%)     Time  6    Period  Weeks    Status  Achieved      PT LONG TERM GOAL #7   Title  Pt will demo increased hip strength for hip ext and hip abduction B from 3+/5 to 4/5  and demo IND with  pre-postwalking stretches in order to maintain wellness and prevent injuries    Time  8    Period  Weeks    Status  Not Met      PT LONG TERM GOAL #8   Title  Pt will report complete emptying urine in the morning across 2 mornings in order to return perform ADLs in the morning     Time  4    Period  Weeks    Status  Not Met            Plan - 04/19/18 7846    Clinical Impression Statement Across the past 8 visits, pt feels less stressed about urinary issues. Pt has achieved 4 goals, partially met 2 goals, and has not met remaining 2 goals. Pt's NIH-CPSI score decreased  indicating less difficulty with urination and improved function. Pt also demo'd decreased [pelvic floor tightness, improved pelvic floor mobility, coordination for pelvic floor strengthening exercises. Pt's decreased pelvic floor tightness and compliance to relaxation and bladder retraining with urge and increased water intake have helped with urgency. Pt 's diastasis recti was addressed tiwht manual Tx and HEP to improve his intraabdominal pressure system. Pt 's remaining deficits include hip weakness. PT recommended pt to join a cardiac rehab program given his cardiac surgeries to optimize health and wellness and to continue increasing hip strength. Pt voiced agreement and received information about classes.   Pt's nocturia has improved from 3-4x/ night to 2-3 x night with  pelvic PT but he may make further progress with further evaluation of OSA. Pt was educated the research that nocturia can improve with  pelvic PT but it can also also be related to OSA which pt has not been screened for yet. Pt has increased risks factors for OSA (i.e.CAD, heart surgery, HBP, high cholesterol, diabetes, sedentary job, and report of snoring) and therefore, would benefit from a sleep study for overall health and wellness.  PT plans to communicate with his cardiologist re: recommendation for sleep study and cardiac rehab. Pt is ready to be d/c at this time.      Research studies show that nocturia episodes decrease with treatment of OSA.    Citations: Raheem O et al. Clinical predictors of nocturia in the sleep apnea population. Urology Annals. 2014. 6 (1): 31-35.).      Kalman Drape al.  Sleep Apnea and Cardiovascular Disease. Journal of the [employer] of Cardiology. 2008. 52(8).       Rehab Potential  Good    PT Frequency  1x / week    PT Duration  12 weeks    PT Treatment/Interventions  Therapeutic activities;Moist Heat;Therapeutic exercise;Balance training;Neuromuscular re-education;Manual techniques;Patient/family education;Energy conservation    Consulted and Agree with Plan of Care  Patient       Patient will benefit from skilled therapeutic intervention in order to improve the following deficits and impairments:  Increased muscle spasms, Improper body mechanics, Decreased strength, Decreased mobility, Postural dysfunction, Decreased endurance, Decreased coordination, Decreased range of motion  Visit Diagnosis: Other lack of coordination  Other symptoms and signs involving the musculoskeletal system     Problem List Patient Active Problem List   Diagnosis Date Noted  . Abnormal nuclear stress test   . CAD in native artery 08/06/2017  . Essential hypertension 08/06/2017  . Hypokalemia 08/06/2017  . ST elevation myocardial infarction involving left anterior descending (LAD) coronary artery ([place])   . Type 2 diabetes mellitus with complication, without long-term current use of insulin  ([place])   . Hyperlipidemia LDL goal <70     Jerl Mina ,PT, DPT, E-RYT  04/19/2018, 11:17 PM  [place] MAIN [facility] SERVICES [address] [place], [place], [address] Phone: [phone]   Fax:  [phone]  Name: Patrick [name] MRN: [id number] Date of Birth: Sep 15, 1956

## 2018-04-20 ENCOUNTER — Other Ambulatory Visit
Admission: RE | Admit: 2018-04-20 | Discharge: 2018-04-20 | Disposition: A | Discharge status: Home / Self Care | Payer: 59 | Source: Ambulatory Visit | Attending: Internal Medicine | Admitting: Internal Medicine

## 2018-04-20 DIAGNOSIS — R0602 Shortness of breath: Secondary | ICD-10-CM | POA: Insufficient documentation

## 2018-04-20 LAB — FIBRIN DERIVATIVES D-DIMER (ARMC ONLY): FIBRIN DERIVATIVES D-DIMER (ARMC): 315.28 ng{FEU}/mL (ref 0.00–499.00)

## 2018-04-22 ENCOUNTER — Telehealth: Payer: Self-pay | Admitting: Cardiovascular Disease

## 2018-04-22 NOTE — Addendum Note (Signed)
Addended by: Mariane Masters on: 04/22/2018 10:59 AM   Modules accepted: Orders

## 2018-04-22 NOTE — Telephone Encounter (Signed)
Left message to call back  

## 2018-04-22 NOTE — Telephone Encounter (Signed)
New Message:    Please call,she wants to give you a message for Dr Kelly,concerning this patient.Marland Kitchen

## 2018-04-25 ENCOUNTER — Ambulatory Visit (INDEPENDENT_AMBULATORY_CARE_PROVIDER_SITE_OTHER): Payer: 59 | Admitting: Cardiovascular Disease

## 2018-04-25 ENCOUNTER — Encounter: Payer: Self-pay | Admitting: Cardiovascular Disease

## 2018-04-25 VITALS — BP 162/92 | HR 73 | Ht 72.0 in | Wt 233.8 lb

## 2018-04-25 DIAGNOSIS — I4510 Unspecified right bundle-branch block: Secondary | ICD-10-CM | POA: Diagnosis not present

## 2018-04-25 DIAGNOSIS — E118 Type 2 diabetes mellitus with unspecified complications: Secondary | ICD-10-CM

## 2018-04-25 DIAGNOSIS — I2510 Atherosclerotic heart disease of native coronary artery without angina pectoris: Secondary | ICD-10-CM | POA: Diagnosis not present

## 2018-04-25 DIAGNOSIS — K219 Gastro-esophageal reflux disease without esophagitis: Secondary | ICD-10-CM | POA: Diagnosis not present

## 2018-04-25 DIAGNOSIS — I10 Essential (primary) hypertension: Secondary | ICD-10-CM | POA: Diagnosis not present

## 2018-04-25 DIAGNOSIS — E785 Hyperlipidemia, unspecified: Secondary | ICD-10-CM

## 2018-04-25 MED ORDER — METOPROLOL TARTRATE 50 MG PO TABS: 50.0000 mg | ORAL_TABLET | Freq: Two times a day (BID) | ORAL | 3 refills | Status: DC

## 2018-04-25 MED ORDER — AMLODIPINE BESYLATE 10 MG PO TABS: 10.0000 mg | ORAL_TABLET | Freq: Every day | ORAL | 3 refills | Status: DC

## 2018-04-25 NOTE — Progress Notes (Signed)
Cardiology Office Note    Date:  04/25/2018   ID:  Patrick [name], DOB [birth year], MRN [id number]  PCP:  Kirk Ruths, MD  Cardiologist:  Shelva Majestic, MD    History of Present Illness:  Patrick [name] is a 62 y.o. male who suffered an anterior ST segment elevation myocardial infarction on 08/04/2017. .  I saw him for initial office visit in November 2018.  He presents for follow-up evaluation following a recent cardiac catheterization.  Patrick [name] has a history of hypertension, hyperlipidemia, and diabetes mellitus.  He was admitted on 08/04/2017 with ST segment elevation anterolaterally and a code STEMI was activated.  I performed emergent cardiac catheterization which revealed total occlusion of his LAD after the first diagonal vessel.  There was a long segment of occlusion and ultimately a Resolute on a 3.038 mm stent was inserted, postdilated 3.25 mm with 100% occlusion being reduced to 0% and TIMI 0 flow been improved to TIMI 3 flow.  He developed transient heart block during the procedure which required IV atropine.  Of note, his lipid panel during that evaluation was consistent with an atherogenic dyslipidemic pattern with triglycerides of 370, VLDL 74, low HDL at 29, and his total cholesterol was 204 with LDL 101.  He was started on atorvastatin and also was on fenofibrate.  Subsequent, please fenofibrate was discontinued by his primary physician and he has been taking over-the-counter fish oil.  Repeat blood work 1 month later by his primary physician showed a total cholesterol 124, LDL 47, triglycerides were 251, HDL was 26.5.  Over the past several months, he has done well.  He denies any definitive recurrent anginal symptomatology.  He had experienced 1 vague episode of  similar sensation after working 16 hours a day for 3 days and having to go to another business trip in [place].    When I last saw him he had noticed some occasional episodes of heartburn sensation, but also has  noted this with mild exertion.  He was seen by [name], NP on 02/28/2018.  Because of worsening heartburn symptoms and fatigue, which seemed nitrate responsive she recommended a follow-up nuclear perfusion study for reassessment.  His nuclear study  on 03/10/2018 was interpreted as a high risk study suggested ischemia involving both the inferior as well as anterior wall.  There was diffuse hypokinesis.  EF was 45%.  When I saw him in follow-up of the nuclear study I recommended definitive cardiac catheterization.  Cardiac catheterization was done on March 21, 2018..  This revealed preserved global LV contractility with a small region of very mild residual mid anterolateral hypocontractility.  Ejection fraction was 50-55%.  The stent in the mid LAD was widely patent and his circumflex and dominant RCA were normal.  Medical therapy was recommended and optimization of blood pressure.  Due to lipid studies that were significantly elevated in August 2018 I recommended the addition of his Vascepa to his atorvastatin.  He is still waiting for approval.  Recently, he was evaluated at [facility] rehab services.  He has had issues with bronchitis and was treated with amoxicillin and inhaler therapy.  He presents for cardiology reevaluation.  Past Medical History:  Diagnosis Date  . CAD in native artery    a. anterolateral STEMI 07/2017 s/p DES to prox-mid LAD, EF 55%.  Marland Kitchen GERD (gastroesophageal reflux disease)   . Hyperlipidemia   . Hypertension   . Overactive bladder   . Renal calculi   . Uncontrolled  diabetes mellitus ([place])     Past Surgical History:  Procedure Laterality Date  . CORONARY/GRAFT ACUTE MI REVASCULARIZATION N/A 08/04/2017   Procedure: Coronary/Graft Acute MI Revascularization;  Surgeon: Troy Sine, MD;  Location: [place] [facility];  Service: Cardiovascular;  Laterality: N/A;  . LEFT HEART CATH AND CORONARY ANGIOGRAPHY N/A 08/04/2017   Procedure: LEFT HEART CATH AND CORONARY  ANGIOGRAPHY;  Surgeon: Troy Sine, MD;  Location: [place] [facility];  Service: Cardiovascular;  Laterality: N/A;  . LEFT HEART CATH AND CORONARY ANGIOGRAPHY N/A 03/21/2018   Procedure: LEFT HEART CATH AND CORONARY ANGIOGRAPHY;  Surgeon: Troy Sine, MD;  Location: [place] [facility];  Service: Cardiovascular;  Laterality: N/A;  . PROSTATE SURGERY      Current Medications: Outpatient Medications Prior to Visit  Medication Sig Dispense Refill  . albuterol (PROVENTIL HFA;VENTOLIN HFA) 108 (90 Base) MCG/ACT inhaler Inhale 2 puffs into the lungs every 6 (six) hours as needed for wheezing or shortness of breath.    Marland Kitchen aspirin 81 MG tablet Take 1 tablet (81 mg total) by mouth daily. 90 tablet 3  . atorvastatin (LIPITOR) 40 MG tablet TAKE 1 TABLET(40 MG) BY MOUTH EVERY EVENING 30 tablet 3  . Coenzyme Q10 200 MG capsule Take 200 mg by mouth daily.    . fesoterodine (TOVIAZ) 4 MG TB24 tablet Take 1 tablet (4 mg total) by mouth daily. 30 tablet 3  . glimepiride (AMARYL) 2 MG tablet Take 2 mg by mouth daily with breakfast.    . Glucosamine-Chondroit-Vit C-Mn (GLUCOSAMINE 1500 COMPLEX PO) Take 1,500 mg by mouth daily.    Vanessa Kick Ethyl (VASCEPA) 1 g CAPS Take 2 capsules (2 g total) by mouth 2 (two) times daily. 360 capsule 3  . losartan (COZAAR) 100 MG tablet TAKE 1 TABLET(100 MG) BY MOUTH DAILY 30 tablet 3  . Magnesium 250 MG TABS Take 250 mg by mouth daily.    . Magnesium Citrate 100 MG TABS Take 100 mg by mouth daily.    . metformin (FORTAMET) 500 MG (OSM) 24 hr tablet Take 2,000 mg by mouth 2 (two) times daily with a meal.     . Multiple Vitamins-Minerals (ABC PLUS SENIOR PO) Take 1 tablet by mouth daily.    . nitroGLYCERIN (NITROSTAT) 0.4 MG SL tablet Place 1 tablet (0.4 mg total) under the tongue every 5 (five) minutes x 3 doses as needed for chest pain. 25 tablet 3  . pantoprazole (PROTONIX) 40 MG tablet Take 40 mg by mouth daily.    . Potassium 99 MG TABS Take 99 mg by mouth daily.    Marland Kitchen  pyridOXINE (VITAMIN B-6) 50 MG tablet Take 50 mg by mouth daily.    . ticagrelor (BRILINTA) 90 MG TABS tablet Take 1 tablet (90 mg total) by mouth 2 (two) times daily. 60 tablet 11  . vitamin C (ASCORBIC ACID) 500 MG tablet Take 500 mg by mouth daily.    . vitamin E 400 UNIT capsule Take 400 Units by mouth daily.    Marland Kitchen amLODipine (NORVASC) 5 MG tablet Take 1 tablet (5 mg total) by mouth daily. 90 tablet 3  . famotidine (PEPCID) 40 MG tablet Take 1 tablet (40 mg total) by mouth daily after lunch. 30 tablet 6  . metoprolol tartrate (LOPRESSOR) 25 MG tablet Take 1.5 tablets (37.5 mg total) by mouth 2 (two) times daily. 270 tablet 3   No facility-administered medications prior to visit.      Allergies:  Patient has no known allergies.   Social History   Socioeconomic History  . Marital status: Married    Spouse name: Not on file  . Number of children: Not on file  . Years of education: Not on file  . Highest education level: Not on file  Occupational History  . Not on file  Social Needs  . Financial resource strain: Not on file  . Food insecurity:    Worry: Not on file    Inability: Not on file  . Transportation needs:    Medical: Not on file    Non-medical: Not on file  Tobacco Use  . Smoking status: Never Smoker  . Smokeless tobacco: Never Used  Substance and Sexual Activity  . Alcohol use: No  . Drug use: No  . Sexual activity: Not on file  Lifestyle  . Physical activity:    Days per week: Not on file    Minutes per session: Not on file  . Stress: Not on file  Relationships  . Social connections:    Talks on phone: Not on file    Gets together: Not on file    Attends religious service: Not on file    Active member of club or organization: Not on file    Attends meetings of clubs or organizations: Not on file    Relationship status: Not on file  Other Topics Concern  . Not on file  Social History Narrative  . Not on file     Family History:  The patient's family  history includes Heart attack in his father; Heart disease in his father.   ROS General: Negative; No fevers, chills, or night sweats;  HEENT: Negative; No changes in vision or hearing, sinus congestion, difficulty swallowing Pulmonary: Positive for recent bronchitis Cardiovascular:  .  See history of present illness GI: Negative; No nausea, vomiting, diarrhea, or abdominal pain GU: Negative; No dysuria, hematuria, or difficulty voiding Musculoskeletal: Negative; no myalgias, joint pain, or weakness Hematologic/Oncology: Negative; no easy bruising, bleeding Endocrine: Positive for diabetes Neuro: Negative; no changes in balance, headaches Skin: Negative; No rashes or skin lesions Psychiatric: Negative; No behavioral problems, depression Sleep: Negative; No awareness of snoring, daytime sleepiness, hypersomnolence, bruxism, restless legs, hypnogognic hallucinations, no cataplexy Other comprehensive 14 point system review is negative.   PHYSICAL EXAM:   BP (!) 162/92   Pulse 73   Ht 6' (1.829 m)   Wt 233 lb 12.8 oz (106.1 kg)   BMI 31.71 kg/m    Repeat blood pressure by me was 160/90  Wt Readings from Last 3 Encounters:  04/25/18 233 lb 12.8 oz (106.1 kg)  03/22/18 230 lb (104.3 kg)  03/21/18 230 lb (104.3 kg)    General: Alert, oriented, no distress.  Skin: normal turgor, no rashes, warm and dry HEENT: Normocephalic, atraumatic. Pupils equal round and reactive to light; sclera anicteric; extraocular muscles intact;  Nose without nasal septal hypertrophy Mouth/Parynx benign; Mallinpatti scale 3 Neck: No JVD, no carotid bruits; normal carotid upstroke Lungs: clear to ausculatation and percussion; no wheezing or rales Chest wall: without tenderness to palpitation Heart: PMI not displaced, RRR, s1 s2 normal, 1/6 systolic murmur, no diastolic murmur, no rubs, gallops, thrills, or heaves Abdomen: Mild central adiposity soft, nontender; no hepatosplenomehaly, BS+; abdominal aorta  nontender and not dilated by palpation. Back: no CVA tenderness Pulses 2+ Musculoskeletal: full range of motion, normal strength, no joint deformities Extremities: no clubbing cyanosis or edema, Homan's sign negative  Neurologic: grossly nonfocal; Cranial  nerves grossly wnl Psychologic: Normal mood and affect   Studies/Labs Reviewed:   EKG:  EKG is ordered today. ECG (independently read by me): Normal sinus rhythm at 73 bpm.  Right bundle branch block with repolarization changes.  March 16, 2018 ECG (independently read by me): NSR at 63;RBBB with repolarization changes QTc 431 msec  November 2018 ECG (independently read by me): Normal sinus rhythm at 66 bpm.  Right bundle branch block with repolarization changes.  QTc interval 448 ms.  Recent Labs: BMP Latest Ref Rng & Units 03/16/2018 08/05/2017 08/04/2017  Glucose 65 - 99 mg/dL 114(H) 174(H) 367(H)  BUN 8 - 27 mg/dL 23 17 28(H)  Creatinine 0.76 - 1.27 mg/dL 1.09 1.16 1.20  BUN/Creat Ratio 10 - 24 21 - -  Sodium 134 - 144 mmol/L 145(H) 138 136  Potassium 3.5 - 5.2 mmol/L 4.5 3.4(L) 4.7  Chloride 96 - 106 mmol/L 107(H) 105 98(L)  CO2 20 - 29 mmol/L 17(L) 24 -  Calcium 8.6 - 10.2 mg/dL 9.6 8.6(L) -     Hepatic Function Latest Ref Rng & Units 08/04/2017 11/08/2016  Total Protein 6.5 - 8.1 g/dL 6.6 6.6  Albumin 3.5 - 5.0 g/dL 4.1 4.2  AST 15 - 41 U/L 36 31  ALT 17 - 63 U/L 55 30  Alk Phosphatase 38 - 126 U/L 35(L) 27(L)  Total Bilirubin 0.3 - 1.2 mg/dL 0.7 0.5  Bilirubin, Direct 0.1 - 0.5 mg/dL - 0.2    CBC Latest Ref Rng & Units 03/16/2018 08/05/2017 08/04/2017  WBC 3.4 - 10.8 x10E3/uL 6.5 9.5 -  Hemoglobin 13.0 - 17.7 g/dL 15.4 13.5 12.6(L)  Hematocrit 37.5 - 51.0 % 44.0 37.7(L) 37.0(L)  Platelets 150 - 379 x10E3/uL 208 218 -   Lab Results  Component Value Date   MCV 87 03/16/2018   MCV 83.8 08/05/2017   MCV 83.6 08/04/2017   No results found for: TSH Lab Results  Component Value Date   HGBA1C 9.8 (H) 08/05/2017      BNP No results found for: BNP  ProBNP No results found for: PROBNP   Lipid Panel     Component Value Date/Time   CHOL 204 (H) 08/04/2017 1005   TRIG 370 (H) 08/04/2017 1005   HDL 29 (L) 08/04/2017 1005   CHOLHDL 7.0 08/04/2017 1005   VLDL 74 (H) 08/04/2017 1005   LDLCALC 101 (H) 08/04/2017 1005     RADIOLOGY: No results found.   Additional studies/ records that were reviewed today include:   Emergent cardiac catheterization/PCI 08/04/2017: Conclusion     LV end diastolic pressure is mildly elevated.  The left ventricular systolic function is normal.  A STENT RESOLUTE [id number] drug eluting stent was successfully placed.  Prox LAD to Mid LAD lesion, 100 %stenosed.  Post intervention, there is a 0% residual stenosis.   Acute ST segment elevation myocardial infarction secondary to total occlusion of the LAD immediately after the takeoff of the first diagonal and septal perforating artery.  There was TIMI 0 flow.  Mild luminal irregularity of the left circumflex vessel without significant obstructive stenoses.  Dominant normal RCA.  Successful PCI to the totally occluded LAD which resulted in a long segment of occlusion and ultimately treated with PTCA and DES stenting with a Resolute onyx 3.038 mm stent postdilated to 3.25 mm with 100% occlusion being reduced to 0% and TIMI 0 flow being improved to TIMI 3 flow.  Transient heart block treated with IV atropine.  Preserved global LV contractility with subtle  apical inferior focal hypocontractility.  The patient presented to [facility] emergency room at ~ 8:45 AM.  He presented to [facility] catheterization lab at approximately 10:45 AM.  During balloon time from presentation to [facility] catheterization laboratory was ~ 20 minutes.  RECOMMENDATION: The patient will continue with dual antiplatelet therapy for minimum of a year.  He will be started on high potency statin therapy, low-dose beta blocker therapy, with  ultimate ACE inhibition.  Metformin will be held for 48 hours postcontrast.    03/10/2018 Nuclear Study Highlights     The left ventricular ejection fraction is mildly decreased (45-54%).  Nuclear stress EF: 45%.  There was no ST segment deviation noted during stress.  Findings consistent with ischemia.  This is a high risk study.   TID 1.3 Normalization artifact likely but appears to be both inferior and anterior ischemia Diffuse hypokinesis EF 45%     March 21, 2018 cardiac catheterization conclusion     Previously placed Prox LAD to Mid LAD stent (unknown type) is widely patent.  The left ventricular systolic function is normal.  LV end diastolic pressure is normal.  The left ventricular ejection fraction is 50-55% by visual estimate.  There is no mitral valve regurgitation.   Preserved global LV contractility with a small region of very mild residual mid anterolateral hypocontractility.  Ejection fraction 50-55%.  No significant coronary obstructive disease with evidence for widely patent mid LAD stent after the takeoff of a first diagonal vessel, normal left circumflex and normal dominant RCA.  RECOMMENDATION: Medical therapy.  Continue DAPT in this patient status post anterior ST segment MI August 2018.  Optimize blood pressure.  Continue high potency statin therapy.   Indications     ASSESSMENT:    1. Coronary artery disease involving native coronary artery of native heart without angina pectoris   2. Hyperlipidemia LDL goal <70   3. Essential hypertension   4. Type 2 diabetes mellitus with complication, without long-term current use of insulin ([place])   5. Chronic GERD   6. RBBB      PLAN:  Patrick [name] is a 62 year old gentleman who has a history of hypertension, hyperlipidemia, and diabetes mellitus. On 08/04/2017 he presented with an acute anterior wall ST segment elevation myocardial infarctions and was found to have total occlusion of his  LAD after the first diagonal vessel.  He was found to have a long occluded segment which was successfully treated with DES stenting with a 3.038 mm Resolute DES stent.   When I saw for initial posthospital evaluation his BMI was mildly increased at 31.36.  Weight reduction was recommended.  He also has mild central adiposity and discussed with him that this type of fat typically is associated with greater levels of inflammation. His blood pressure was elevated and I titrated his medications.  His most recent nuclear perfusion study suggested the possibility of ischemia involving both the anterior and inferior wall.  EF was 45% and there was diffuse hypokinesis.  With his blood pressure elevation, and nuclear study suggestive of ischemia, I added amlodipine 5 mg to his regimen and further titrated metoprolol to 37.5 mg twice a day.  He underwent definitive repeat cardiac catheterization which revealed a widely patent mid LAD stent and a normal left circumflex and dominant RCA.  He has not yet been approved from his insurance company for over [employer].  Since his cardiac catheterization he denies any recurrent anginal type symptomatology.  He has been on amlodipine 5 mg,  losartan 100 mg, and metoprolol 37.5 mg twice a day.  His blood pressure is elevated today and I am further titrating amlodipine to 10 mg and will also increase metoprolol to 50 mg twice a day.  In August 2018 triglycerides were 370 which have improved on follow-up laboratory in January 2019 by his primary physician.  Again discussed the reducer trial data with him.  BMI is 31.7 consistent with obesity.  We discussed potential evaluation for obstructive sleep apnea.  He has nocturia 2 times per night.  He is unaware of snoring and he denies daytime sleepiness.  He has just completed a course of antibiotics for bronchitis.  We discussed increase exercise and weight loss.  He has GERD which is controlled with pantoprazole.  He is diabetic on metformin in  addition to glyburide.  His right bundle branch block is stable. I will see him in 4 to 5 months for reevaluation.   tedication Adjustments/Labs and Tests Ordered: Current medicines are reviewed at length with the patient today.  Concerns regarding medicines are outlined above.  Medication changes, Labs and Tests ordered today are listed in the Patient Instructions below. Patient Instructions  Medication Instructions:  INCREASE amlodipine to 10 mg daily INCREASE metoprolol tartrate (Lopressor) to 50 mg two times daily  Follow-Up: Your physician wants you to follow-up in: October with Dr. Claiborne Billings. You will receive a reminder letter in the mail two months in advance. If you don't receive a letter, please call our office to schedule the follow-up appointment.   Any Other Special Instructions Will Be Listed Below (If Applicable).     If you need a refill on your cardiac medications before your next appointment, please call your pharmacy.      Signed, Shelva Majestic, MD  04/25/2018 1:31 PM    [facility] [address], [place], [place], [place]  [address] Phone: [phone]

## 2018-04-25 NOTE — Patient Instructions (Signed)
Medication Instructions:  INCREASE amlodipine to 10 mg daily INCREASE metoprolol tartrate (Lopressor) to 50 mg two times daily  Follow-Up: Your physician wants you to follow-up in: October with Dr. Tresa Endo. You will receive a reminder letter in the mail two months in advance. If you don't receive a letter, please call our office to schedule the follow-up appointment.   Any Other Special Instructions Will Be Listed Below (If Applicable).     If you need a refill on your cardiac medications before your next appointment, please call your pharmacy.

## 2018-04-27 NOTE — Addendum Note (Signed)
Addended by: Armen Pickup T on: 04/27/2018 01:55 PM   Modules accepted: Orders

## 2018-05-04 ENCOUNTER — Telehealth: Payer: Self-pay | Admitting: *Deleted

## 2018-05-04 ENCOUNTER — Encounter: Payer: Self-pay | Admitting: *Deleted

## 2018-05-04 NOTE — Telephone Encounter (Signed)
Called [employer] to follow up on Vascepa appeal faxed 4/1- denial was upheld 5/1 and will fax a notification.     Routed to Dr. Tresa Endo to make aware and for further recommendations/medication changes.

## 2018-05-06 MED ORDER — OMEGA-3-ACID ETHYL ESTERS 1 G PO CAPS: 2.0000 g | ORAL_CAPSULE | Freq: Two times a day (BID) | ORAL | 3 refills | Status: DC

## 2018-05-06 NOTE — Telephone Encounter (Signed)
Unable to use Vascepa;  would try prescription for lovaza 2 capsules twice a day

## 2018-05-06 NOTE — Telephone Encounter (Signed)
Responded to patient via Mychart, rx sent to pharmacy.

## 2018-05-10 ENCOUNTER — Telehealth: Payer: Self-pay | Admitting: *Deleted

## 2018-05-10 NOTE — Telephone Encounter (Signed)
PA submitted for Lovaza on Covermymeds.

## 2018-05-13 NOTE — Telephone Encounter (Signed)
Lovaza denied by insurance as triglycerides are not >500.    Routed to Dr. Tresa Endo for alternative.

## 2018-05-16 NOTE — Telephone Encounter (Signed)
If insurance will not cover lovaza or vascepa, then OTC omega 3 FFA 2 capsules daily

## 2018-05-17 ENCOUNTER — Encounter: Payer: Self-pay | Admitting: *Deleted

## 2018-05-17 NOTE — Telephone Encounter (Signed)
Message sent via my chart

## 2018-06-22 ENCOUNTER — Other Ambulatory Visit: Payer: Self-pay | Admitting: Cardiovascular Disease

## 2018-06-22 DIAGNOSIS — E782 Mixed hyperlipidemia: Secondary | ICD-10-CM

## 2018-06-30 ENCOUNTER — Telehealth: Payer: Self-pay | Admitting: Cardiovascular Disease

## 2018-06-30 NOTE — Telephone Encounter (Signed)
Pt c/o medication issue:  1. Name of Medication: Amlodipine  2. How are you currently taking this medication (dosage and times per day)? takes 1 a day  3. Are you having a reaction (difficulty breathing--STAT)? Some difficult breathing off and on  4. What is your medication issue? *swelling in legs and ankles

## 2018-07-29 ENCOUNTER — Other Ambulatory Visit: Payer: Self-pay | Admitting: Physician Assistant

## 2018-08-04 ENCOUNTER — Other Ambulatory Visit: Payer: Self-pay | Admitting: Physician Assistant

## 2018-08-04 NOTE — Telephone Encounter (Signed)
Rx request sent to pharmacy.  

## 2018-08-05 ENCOUNTER — Other Ambulatory Visit: Payer: Self-pay

## 2018-08-05 MED ORDER — TICAGRELOR 90 MG PO TABS: ORAL_TABLET | ORAL | 3 refills | Status: DC

## 2018-09-02 ENCOUNTER — Other Ambulatory Visit: Payer: Self-pay

## 2018-09-02 MED ORDER — TICAGRELOR 90 MG PO TABS: ORAL_TABLET | ORAL | 2 refills | Status: DC

## 2018-09-20 ENCOUNTER — Ambulatory Visit (INDEPENDENT_AMBULATORY_CARE_PROVIDER_SITE_OTHER): Payer: 59 | Admitting: Urology

## 2018-09-20 ENCOUNTER — Encounter: Payer: Self-pay | Admitting: Urology

## 2018-09-20 VITALS — BP 143/83 | HR 85 | Resp 16 | Ht 72.0 in | Wt 244.5 lb

## 2018-09-20 DIAGNOSIS — R350 Frequency of micturition: Secondary | ICD-10-CM | POA: Diagnosis not present

## 2018-09-20 DIAGNOSIS — N398 Other specified disorders of urinary system: Secondary | ICD-10-CM

## 2018-09-20 DIAGNOSIS — Z87898 Personal history of other specified conditions: Secondary | ICD-10-CM | POA: Diagnosis not present

## 2018-09-20 DIAGNOSIS — N5203 Combined arterial insufficiency and corporo-venous occlusive erectile dysfunction: Secondary | ICD-10-CM

## 2018-09-20 LAB — URINALYSIS, COMPLETE
BILIRUBIN UA: NEGATIVE
Glucose, UA: NEGATIVE
Ketones, UA: NEGATIVE
Nitrite, UA: NEGATIVE
PH UA: 7 (ref 5.0–7.5)
Protein, UA: NEGATIVE
RBC UA: NEGATIVE
Specific Gravity, UA: 1.015 (ref 1.005–1.030)
UUROB: 0.2 mg/dL (ref 0.2–1.0)

## 2018-09-20 LAB — BLADDER SCAN AMB NON-IMAGING: Scan Result: 28

## 2018-09-20 LAB — MICROSCOPIC EXAMINATION
BACTERIA UA: NONE SEEN
Epithelial Cells (non renal): NONE SEEN /hpf (ref 0–10)
RBC, UA: NONE SEEN /hpf (ref 0–2)

## 2018-09-20 MED ORDER — TAMSULOSIN HCL 0.4 MG PO CAPS: 0.4000 mg | ORAL_CAPSULE | Freq: Every day | ORAL | 11 refills | Status: DC

## 2018-09-20 NOTE — Progress Notes (Signed)
09/20/2018 8:36 PM   Patrick [name] 12/25/1955 [id number]  Referring provider: Lauro Regulus, MD [address] [facility] [place], [place] [address]  Chief Complaint  Patient presents with  . Urinary Frequency    HPI: 62 year old male with dysfunctional voiding and elevated PSA who returns today for routine follow-up.   He is s/p UDS 11/2017 demonstrating increased EMG activity duing all voids.    Since last visit, he tried toviaz 4 mg x 8 weeks which helped but then he decided to stop this medication because he was having trouble voiding at night.  He resumed flomax which was previously prescribed which resolved this issue.  His daytime symptoms are stable.  IPSS as below.    Of of all medications, mybetriq has worked the best but $$$$.  He is not doing the PT exercises and his symptoms are worsening.    He also has a history of elevated PSA.   We have received records from Dr. Amada Jupiter office dating back to 2004 which time his PSA was 6.8.  It appears that he had chest biopsy at this time.  He also had a biopsy in 2008 when his PSA 6.3.  His PSA appears to be fluctuating ranging anywhere from 1.6 as high as 6.8.  We do not have the results of his previous biopsies but he reports that these are negative.   Most recent PSA 3.5 on 03/2018.  Personal history of ED.  He has a personal history of coronary artery disease status post STEMI and has a prescription for nitrates.  He is now using intracavernosal injections with good result.    IPSS    Row Name 09/20/18 1500         International Prostate Symptom Score   How often have you had the sensation of not emptying your bladder?  About half the time     How often have you had to urinate less than every two hours?  More than half the time     How often have you found you stopped and started again several times when you urinated?  More than half the time     How often have you found it difficult to  postpone urination?  Less than half the time     How often have you had a weak urinary stream?  Less than half the time     How often have you had to strain to start urination?  Not at All     How many times did you typically get up at night to urinate?  3 Times     Total IPSS Score  18       Quality of Life due to urinary symptoms   If you were to spend the rest of your life with your urinary condition just the way it is now how would you feel about that?  Mixed        Score:  1-7 Mild 8-19 Moderate 20-35 Severe   PMH: Past Medical History:  Diagnosis Date  . CAD in native artery    a. anterolateral STEMI 07/2017 s/p DES to prox-mid LAD, EF 55%.  Marland Kitchen GERD (gastroesophageal reflux disease)   . Hyperlipidemia   . Hypertension   . Overactive bladder   . Renal calculi   . Uncontrolled diabetes mellitus [facility])     Surgical History: Past Surgical History:  Procedure Laterality Date  . CORONARY/GRAFT ACUTE MI REVASCULARIZATION N/A 08/04/2017   Procedure:  Coronary/Graft Acute MI Revascularization;  Surgeon: Lennette Bihari, MD;  Location: [facility] INVASIVE CV LAB;  Service: Cardiovascular;  Laterality: N/A;  . LEFT HEART CATH AND CORONARY ANGIOGRAPHY N/A 08/04/2017   Procedure: LEFT HEART CATH AND CORONARY ANGIOGRAPHY;  Surgeon: Lennette Bihari, MD;  Location: [facility] INVASIVE CV LAB;  Service: Cardiovascular;  Laterality: N/A;  . LEFT HEART CATH AND CORONARY ANGIOGRAPHY N/A 03/21/2018   Procedure: LEFT HEART CATH AND CORONARY ANGIOGRAPHY;  Surgeon: Lennette Bihari, MD;  Location: [facility] INVASIVE CV LAB;  Service: Cardiovascular;  Laterality: N/A;  . PROSTATE SURGERY      Home Medications:  Allergies as of 09/20/2018   No Known Allergies     Medication List        Accurate as of 09/20/18  8:36 PM. Always use your most recent med list.          ABC PLUS SENIOR PO Take 1 tablet by mouth daily.   albuterol 108 (90 Base) MCG/ACT inhaler Commonly known as:  PROVENTIL HFA;VENTOLIN HFA Inhale 2  puffs into the lungs every 6 (six) hours as needed for wheezing or shortness of breath.   amLODipine 10 MG tablet Commonly known as:  NORVASC Take 1 tablet (10 mg total) by mouth daily.   aspirin 81 MG EC tablet Take 1 tablet (81 mg total) by mouth daily.   atorvastatin 40 MG tablet Commonly known as:  LIPITOR TAKE 1 TABLET(40 MG) BY MOUTH EVERY EVENING   Coenzyme Q10 200 MG capsule Take 200 mg by mouth daily.   glimepiride 2 MG tablet Commonly known as:  AMARYL Take 2 mg by mouth daily with breakfast.   GLUCOSAMINE 1500 COMPLEX PO Take 1,500 mg by mouth daily.   losartan 100 MG tablet Commonly known as:  COZAAR TAKE 1 TABLET BY MOUTH DAILY   Magnesium 250 MG Tabs Take 250 mg by mouth daily.   Magnesium Citrate 100 MG Tabs Take 100 mg by mouth daily.   metformin 500 MG (OSM) 24 hr tablet Commonly known as:  FORTAMET Take 2,000 mg by mouth 2 (two) times daily with a meal.   metoprolol tartrate 50 MG tablet Commonly known as:  LOPRESSOR Take 1 tablet (50 mg total) by mouth 2 (two) times daily.   nitroGLYCERIN 0.4 MG SL tablet Commonly known as:  NITROSTAT Place 1 tablet (0.4 mg total) under the tongue every 5 (five) minutes x 3 doses as needed for chest pain.   pantoprazole 40 MG tablet Commonly known as:  PROTONIX Take 40 mg by mouth daily.   Potassium 99 MG Tabs Take 99 mg by mouth daily.   pyridOXINE 50 MG tablet Commonly known as:  VITAMIN B-6 Take 50 mg by mouth daily.   tamsulosin 0.4 MG Caps capsule Commonly known as:  FLOMAX Take 1 capsule (0.4 mg total) by mouth daily.   ticagrelor 90 MG Tabs tablet Commonly known as:  BRILINTA TAKE 1 TABLET(90 MG) BY MOUTH TWICE DAILY   vitamin C 500 MG tablet Commonly known as:  ASCORBIC ACID Take 500 mg by mouth daily.   vitamin E 400 UNIT capsule Take 400 Units by mouth daily.       Allergies: No Known Allergies  Family History: Family History  Problem Relation Age of Onset  . Heart disease  Father   . Heart attack Father     Social History:  reports that he has never smoked. He has never used smokeless tobacco. He reports that he does not drink  alcohol or use drugs.  ROS: UROLOGY Frequent Urination?: Yes Hard to postpone urination?: Yes Burning/pain with urination?: No Get up at night to urinate?: Yes Leakage of urine?: Yes Urine stream starts and stops?: Yes Trouble starting stream?: No Do you have to strain to urinate?: No Blood in urine?: No Urinary tract infection?: No Sexually transmitted disease?: No Injury to kidneys or bladder?: No Painful intercourse?: No Weak stream?: No Erection problems?: Yes Penile pain?: No  Gastrointestinal Nausea?: No Vomiting?: No Indigestion/heartburn?: No Diarrhea?: No Constipation?: No  Constitutional Fever: No Night sweats?: No Weight loss?: No Fatigue?: No  Skin Skin rash/lesions?: No Itching?: No  Eyes Blurred vision?: No Double vision?: No  Ears/Nose/Throat Sore throat?: No Sinus problems?: No  Hematologic/Lymphatic Swollen glands?: No Easy bruising?: Yes  Cardiovascular Leg swelling?: No Chest pain?: No  Respiratory Cough?: No Shortness of breath?: No  Endocrine Excessive thirst?: No  Musculoskeletal Back pain?: No Joint pain?: No  Neurological Headaches?: No Dizziness?: No  Psychologic Depression?: No Anxiety?: No  Physical Exam: BP (!) 143/83   Pulse 85   Resp 16   Ht 6' (1.829 m)   Wt 244 lb 8 oz (110.9 kg)   BMI 33.16 kg/m   Constitutional:  Alert and oriented, No acute distress. HEENT:  AT, moist mucus membranes.  Trachea midline, no masses. Cardiovascular: No clubbing, cyanosis, or edema. Respiratory: Normal respiratory effort, no increased work of breathing.. Skin: No rashes, bruises or suspicious lesions. Rectal: 45 cc prostate, nontender, no nodules. Normal sphincter tone. Neurologic: Grossly intact, no focal deficits, moving all 4 extremities. Psychiatric:  Normal mood and affect.  Laboratory Data: Lab Results  Component Value Date   WBC 6.5 03/16/2018   HGB 15.4 03/16/2018   HCT 44.0 03/16/2018   MCV 87 03/16/2018   PLT 208 03/16/2018    Lab Results  Component Value Date   CREATININE 1.09 03/16/2018    Lab Results  Component Value Date   HGBA1C 9.8 (H) 08/05/2017    Urinalysis N/A  Pertinent Imaging:   Assessment & Plan:    1. Dysfunctional voiding of urine Initially was improved following physical therapy Symptoms have recurred with cessation of physical therapy activities/ exercises Encouraged to resume these as they were helpful in the past  2. 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 History of elevated PSA History of elevated PSA Is a stable Rectal exam completed today, up-to-date    Return in about 1 year (around 09/21/2019) for IPSS/ PSA / DRE.  Vanna Scotland, MD  [facility] [address] [place], [place] [address] [phone]

## 2018-10-17 ENCOUNTER — Encounter: Payer: Self-pay | Admitting: Cardiovascular Disease

## 2018-10-17 ENCOUNTER — Ambulatory Visit (INDEPENDENT_AMBULATORY_CARE_PROVIDER_SITE_OTHER): Payer: 59 | Admitting: Cardiovascular Disease

## 2018-10-17 VITALS — BP 140/90 | HR 93 | Ht 72.0 in | Wt 237.0 lb

## 2018-10-17 DIAGNOSIS — E118 Type 2 diabetes mellitus with unspecified complications: Secondary | ICD-10-CM

## 2018-10-17 DIAGNOSIS — I10 Essential (primary) hypertension: Secondary | ICD-10-CM

## 2018-10-17 DIAGNOSIS — E782 Mixed hyperlipidemia: Secondary | ICD-10-CM | POA: Diagnosis not present

## 2018-10-17 DIAGNOSIS — E669 Obesity, unspecified: Secondary | ICD-10-CM

## 2018-10-17 DIAGNOSIS — I2510 Atherosclerotic heart disease of native coronary artery without angina pectoris: Secondary | ICD-10-CM

## 2018-10-17 DIAGNOSIS — K219 Gastro-esophageal reflux disease without esophagitis: Secondary | ICD-10-CM | POA: Diagnosis not present

## 2018-10-17 MED ORDER — TICAGRELOR 60 MG PO TABS: ORAL_TABLET | ORAL | 3 refills | Status: DC

## 2018-10-17 MED ORDER — METOPROLOL TARTRATE 25 MG PO TABS: 37.5000 mg | ORAL_TABLET | Freq: Two times a day (BID) | ORAL | 3 refills | Status: DC

## 2018-10-17 NOTE — Patient Instructions (Signed)
Medication Instructions:  Decrease Brilinta to 60 mg two times daily Increase metoprolol to 37.5 mg two times daily  Follow-Up: At [facility], you and your health needs are our priority.  As part of our continuing mission to provide you with exceptional heart care, we have created designated Provider Care Teams.  These Care Teams include your primary Cardiologist (physician) and Advanced Practice Providers (APPs -  Physician Assistants and Nurse Practitioners) who all work together to provide you with the care you need, when you need it. You will need a follow up appointment in 6 months.  Please call our office 2 months in advance to schedule this appointment.  You may see Nicki Guadalajara, MD or one of the following Advanced Practice Providers on your designated Care Team: [place], [place] . Micah Flesher, PA-C

## 2018-10-17 NOTE — Progress Notes (Signed)
Cardiology Office Note    Date:  10/19/2018   ID:  MILLIE SHORB, DOB [birth year], MRN [id number]  PCP:  Kirk Ruths, MD  Cardiologist:  Shelva Majestic, MD    History of Present Illness:  Patrick [name] is a 62 y.o. male who suffered an anterior ST segment elevation myocardial infarction on 08/04/2017. .  I saw him for initial office visit in November 2018.  I last saw him in May 2019.  He presents for 26-monthfollow-up evaluation.  Patrick [name] a history of hypertension, hyperlipidemia, and diabetes mellitus.  He was admitted on 08/04/2017 with ST segment elevation anterolaterally and a code STEMI was activated.  I performed emergent cardiac catheterization which revealed total occlusion of his LAD after the first diagonal vessel.  There was a long segment of occlusion and ultimately a Resolute on a 3.038 mm stent was inserted, postdilated 3.25 mm with 100% occlusion being reduced to 0% and TIMI 0 flow been improved to TIMI 3 flow.  He developed transient heart block during the procedure which required IV atropine.  Of note, his lipid panel during that evaluation was consistent with an atherogenic dyslipidemic pattern with triglycerides of 370, VLDL 74, low HDL at 29, and his total cholesterol was 204 with LDL 101.  He was started on atorvastatin and also was on fenofibrate.  Subsequent, please fenofibrate was discontinued by his primary physician and he has been taking over-the-counter fish oil.  Repeat blood work 1 month later by his primary physician showed a total cholesterol 124, LDL 47, triglycerides were 251, HDL was 26.5.  Over the past several months, he has done well.  He denies any definitive recurrent anginal symptomatology.  He had experienced 1 vague episode of  similar sensation after working 16 hours a day for 3 days and having to go to another [place] trip in [place]    When I last saw him he had noticed some occasional episodes of heartburn sensation, but also has noted  this with mild exertion.  He was seen by [name] NP on 02/28/2018.  Because of worsening heartburn symptoms and fatigue, which seemed nitrate responsive she recommended a follow-up nuclear perfusion study for reassessment.  His nuclear study  on 03/10/2018 was interpreted as a high risk study suggested ischemia involving both the inferior as well as anterior wall.  There was diffuse hypokinesis.  EF was 45%.  When I saw him in follow-up of the nuclear study I recommended definitive cardiac catheterization.  Cardiac catheterization was done on March 21, 2018..  This revealed preserved global LV contractility with a small region of very mild residual mid anterolateral hypocontractility.  Ejection fraction was 50-55%.  The stent in the mid LAD was widely patent and his circumflex and dominant RCA were normal.  Medical therapy was recommended and optimization of blood pressure.  Due to lipid studies that were significantly elevated in August 2018 I recommended the addition of his Vascepa to his atorvastatin.   Since I last saw him in May 2019 he has continued to do well.  [facility]  Total cholesterol was 101, HDL 33, LDL 50, triglycerides had improved to 88.  VLDL was 18.  He is now enrolled in the "Soul  study "with Dr. AOuida Sillswhich is an oral Victoza therapy.  Hemoglobin A1c before the study was 8.3 and 1 week ago repeat hemoglobin A1c was 6.8.  He denies recurrent chest pain.  He presents for evaluation.   Past Medical History:  Diagnosis Date  . CAD in native artery    a. anterolateral STEMI 07/2017 s/p DES to prox-mid LAD, EF 55%.  Marland Kitchen GERD (gastroesophageal reflux disease)   . Hyperlipidemia   . Hypertension   . Overactive bladder   . Renal calculi   . Uncontrolled diabetes mellitus ([place])     Past Surgical History:  Procedure Laterality Date  . CORONARY/GRAFT ACUTE MI REVASCULARIZATION N/A 08/04/2017   Procedure: Coronary/Graft  Acute MI Revascularization;  Surgeon: Troy Sine, MD;  Location: [facility];  Service: Cardiovascular;  Laterality: N/A;  . LEFT HEART CATH AND CORONARY ANGIOGRAPHY N/A 08/04/2017   Procedure: LEFT HEART CATH AND CORONARY ANGIOGRAPHY;  Surgeon: Troy Sine, MD;  Location: [place] [facility];  Service: Cardiovascular;  Laterality: N/A;  . LEFT HEART CATH AND CORONARY ANGIOGRAPHY N/A 03/21/2018   Procedure: LEFT HEART CATH AND CORONARY ANGIOGRAPHY;  Surgeon: Troy Sine, MD;  Location: [facility] [facility];  Service: Cardiovascular;  Laterality: N/A;  . PROSTATE SURGERY      Current Medications: Outpatient Medications Prior to Visit  Medication Sig Dispense Refill  . albuterol (PROVENTIL HFA;VENTOLIN HFA) 108 (90 Base) MCG/ACT inhaler Inhale 2 puffs into the lungs every 6 (six) hours as needed for wheezing or shortness of breath.    Marland Kitchen amLODipine (NORVASC) 10 MG tablet Take 1 tablet (10 mg total) by mouth daily. 90 tablet 3  . aspirin (ASPIRIN LOW DOSE) 81 MG EC tablet Take 1 tablet (81 mg total) by mouth daily. 90 tablet 0  . atorvastatin (LIPITOR) 40 MG tablet TAKE 1 TABLET(40 MG) BY MOUTH EVERY EVENING 30 tablet 2  . Coenzyme Q10 200 MG capsule Take 200 mg by mouth daily.    Marland Kitchen glimepiride (AMARYL) 2 MG tablet Take 2 mg by mouth daily with breakfast.    . Glucosamine-Chondroit-Vit C-Mn (GLUCOSAMINE 1500 COMPLEX PO) Take 1,500 mg by mouth daily.    Marland Kitchen losartan (COZAAR) 100 MG tablet TAKE 1 TABLET BY MOUTH DAILY 30 tablet 3  . Magnesium 250 MG TABS Take 250 mg by mouth daily.    . Magnesium Citrate 100 MG TABS Take 100 mg by mouth daily.    . metformin (FORTAMET) 500 MG (OSM) 24 hr tablet Take 2,000 mg by mouth 2 (two) times daily with a meal.     . Multiple Vitamins-Minerals (ABC PLUS SENIOR PO) Take 1 tablet by mouth daily.    . nitroGLYCERIN (NITROSTAT) 0.4 MG SL tablet Place 1 tablet (0.4 mg total) under the tongue every 5 (five) minutes x 3 doses as needed for chest pain. 25  tablet 3  . pantoprazole (PROTONIX) 40 MG tablet Take 40 mg by mouth daily.    . Potassium 99 MG TABS Take 99 mg by mouth daily.    Marland Kitchen pyridOXINE (VITAMIN B-6) 50 MG tablet Take 50 mg by mouth daily.    . tamsulosin (FLOMAX) 0.4 MG CAPS capsule Take 1 capsule (0.4 mg total) by mouth daily. 30 capsule 11  . vitamin C (ASCORBIC ACID) 500 MG tablet Take 500 mg by mouth daily.    . vitamin E 400 UNIT capsule Take 400 Units by mouth daily.    . metoprolol tartrate (LOPRESSOR) 25 MG tablet Take 25 mg by mouth 2 (two) times daily.    . ticagrelor (BRILINTA) 90 MG TABS tablet TAKE 1 TABLET(90 MG) BY MOUTH TWICE DAILY 180 tablet 2  . metoprolol tartrate (LOPRESSOR)  50 MG tablet Take 1 tablet (50 mg total) by mouth 2 (two) times daily. 180 tablet 3   No facility-administered medications prior to visit.      Allergies:   Patient has no known allergies.   Social History   Socioeconomic History  . Marital status: Married    Spouse name: Not on file  . Number of children: Not on file  . Years of education: Not on file  . Highest education level: Not on file  Occupational History  . Not on file  Social Needs  . Financial resource strain: Not on file  . Food insecurity:    Worry: Not on file    Inability: Not on file  . Transportation needs:    Medical: Not on file    Non-medical: Not on file  Tobacco Use  . Smoking status: Never Smoker  . Smokeless tobacco: Never Used  Substance and Sexual Activity  . Alcohol use: No  . Drug use: No  . Sexual activity: Not on file  Lifestyle  . Physical activity:    Days per week: Not on file    Minutes per session: Not on file  . Stress: Not on file  Relationships  . Social connections:    Talks on phone: Not on file    Gets together: Not on file    Attends religious service: Not on file    Active member of club or organization: Not on file    Attends meetings of clubs or organizations: Not on file    Relationship status: Not on file  Other  Topics Concern  . Not on file  Social History Narrative  . Not on file     Family History:  The patient's family history includes Heart attack in his father; Heart disease in his father.   ROS General: Negative; No fevers, chills, or night sweats;  HEENT: Negative; No changes in vision or hearing, sinus congestion, difficulty swallowing Pulmonary: Positive for recent bronchitis Cardiovascular:   See history of present illness GI: Negative; No nausea, vomiting, diarrhea, or abdominal pain GU: Negative; No dysuria, hematuria, or difficulty voiding Musculoskeletal: Negative; no myalgias, joint pain, or weakness Hematologic/Oncology: Negative; no easy bruising, bleeding Endocrine: Positive for diabetes Neuro: Negative; no changes in balance, headaches Skin: Negative; No rashes or skin lesions Psychiatric: Negative; No behavioral problems, depression Sleep: Negative; No awareness of snoring, daytime sleepiness, hypersomnolence, bruxism, restless legs, hypnogognic hallucinations, no cataplexy Other comprehensive 14 point system review is negative.   PHYSICAL EXAM:   BP 140/90   Pulse 93   Ht 6' (1.829 m)   Wt 237 lb (107.5 kg)   BMI 32.14 kg/m    Repeat blood pressure by me was 150/90  Wt Readings from Last 3 Encounters:  10/17/18 237 lb (107.5 kg)  09/20/18 244 lb 8 oz (110.9 kg)  04/25/18 233 lb 12.8 oz (106.1 kg)    General: Alert, oriented, no distress.  Skin: normal turgor, no rashes, warm and dry HEENT: Normocephalic, atraumatic. Pupils equal round and reactive to light; sclera anicteric; extraocular muscles intact;  Nose without nasal septal hypertrophy Mouth/Parynx benign; Mallinpatti scale 3 Neck: No JVD, no carotid bruits; normal carotid upstroke Lungs: clear to ausculatation and percussion; no wheezing or rales Chest wall: without tenderness to palpitation Heart: PMI not displaced, RRR, s1 s2 normal, 1/6 systolic murmur, no diastolic murmur, no rubs, gallops,  thrills, or heaves Abdomen: soft, nontender; no hepatosplenomehaly, BS+; abdominal aorta nontender and not dilated by palpation. Back:  no CVA tenderness Pulses 2+ Musculoskeletal: full range of motion, normal strength, no joint deformities Extremities: no clubbing cyanosis or edema, Homan's sign negative  Neurologic: grossly nonfocal; Cranial nerves grossly wnl Psychologic: Normal mood and affect    Studies/Labs Reviewed:   EKG:  EKG is ordered today. ECG (independently read by me): Normal sinus rhythm at 93 bpm.  Right bundle branch block with repolarization changes.  QTc interval 44 ms.  Apr 25, 2018 ECG (independently read by me): Normal sinus rhythm at 73 bpm.  Right bundle branch block with repolarization changes.  March 16, 2018 ECG (independently read by me): NSR at 63;RBBB with repolarization changes QTc 431 msec  November 2018 ECG (independently read by me): Normal sinus rhythm at 66 bpm.  Right bundle branch block with repolarization changes.  QTc interval 448 ms.  Recent Labs: BMP Latest Ref Rng & Units 03/16/2018 08/05/2017 08/04/2017  Glucose 65 - 99 mg/dL 114(H) 174(H) 367(H)  BUN 8 - 27 mg/dL 23 17 28(H)  Creatinine 0.76 - 1.27 mg/dL 1.09 1.16 1.20  BUN/Creat Ratio 10 - 24 21 - -  Sodium 134 - 144 mmol/L 145(H) 138 136  Potassium 3.5 - 5.2 mmol/L 4.5 3.4(L) 4.7  Chloride 96 - 106 mmol/L 107(H) 105 98(L)  CO2 20 - 29 mmol/L 17(L) 24 -  Calcium 8.6 - 10.2 mg/dL 9.6 8.6(L) -     Hepatic Function Latest Ref Rng & Units 08/04/2017 11/08/2016  Total Protein 6.5 - 8.1 g/dL 6.6 6.6  Albumin 3.5 - 5.0 g/dL 4.1 4.2  AST 15 - 41 U/L 36 31  ALT 17 - 63 U/L 55 30  Alk Phosphatase 38 - 126 U/L 35(L) 27(L)  Total Bilirubin 0.3 - 1.2 mg/dL 0.7 0.5  Bilirubin, Direct 0.1 - 0.5 mg/dL - 0.2    CBC Latest Ref Rng & Units 03/16/2018 08/05/2017 08/04/2017  WBC 3.4 - 10.8 x10E3/uL 6.5 9.5 -  Hemoglobin 13.0 - 17.7 g/dL 15.4 13.5 12.6(L)  Hematocrit 37.5 - 51.0 % 44.0 37.7(L) 37.0(L)    Platelets 150 - 379 x10E3/uL 208 218 -   Lab Results  Component Value Date   MCV 87 03/16/2018   MCV 83.8 08/05/2017   MCV 83.6 08/04/2017   No results found for: TSH Lab Results  Component Value Date   HGBA1C 9.8 (H) 08/05/2017     BNP No results found for: BNP  ProBNP No results found for: PROBNP   Lipid Panel     Component Value Date/Time   CHOL 204 (H) 08/04/2017 1005   TRIG 370 (H) 08/04/2017 1005   HDL 29 (L) 08/04/2017 1005   CHOLHDL 7.0 08/04/2017 1005   VLDL 74 (H) 08/04/2017 1005   LDLCALC 101 (H) 08/04/2017 1005     RADIOLOGY: No results found.   Additional studies/ records that were reviewed today include:   Emergent cardiac catheterization/PCI 08/04/2017: Conclusion     LV end diastolic pressure is mildly elevated.  The left ventricular systolic function is normal.  A STENT RESOLUTE [id number] drug eluting stent was successfully placed.  Prox LAD to Mid LAD lesion, 100 %stenosed.  Post intervention, there is a 0% residual stenosis.   Acute ST segment elevation myocardial infarction secondary to total occlusion of the LAD immediately after the takeoff of the first diagonal and septal perforating artery.  There was TIMI 0 flow.  Mild luminal irregularity of the left circumflex vessel without significant obstructive stenoses.  Dominant normal RCA.  Successful PCI to the totally occluded LAD  which resulted in a long segment of occlusion and ultimately treated with PTCA and DES stenting with a Resolute onyx 3.038 mm stent postdilated to 3.25 mm with 100% occlusion being reduced to 0% and TIMI 0 flow being improved to TIMI 3 flow.  Transient heart block treated with IV atropine.  Preserved global LV contractility with subtle apical inferior focal hypocontractility.  The patient presented to [facility] emergency room at ~ 8:45 AM.  He presented to [facility] catheterization lab at approximately 10:45 AM.  During balloon time from presentation  to [facility] catheterization laboratory was ~ 20 minutes.  RECOMMENDATION: The patient will continue with dual antiplatelet therapy for minimum of a year.  He will be started on high potency statin therapy, low-dose beta blocker therapy, with ultimate ACE inhibition.  Metformin will be held for 48 hours postcontrast.    03/10/2018 Nuclear Study Highlights     The left ventricular ejection fraction is mildly decreased (45-54%).  Nuclear stress EF: 45%.  There was no ST segment deviation noted during stress.  Findings consistent with ischemia.  This is a high risk study.   TID 1.3 Normalization artifact likely but appears to be both inferior and anterior ischemia Diffuse hypokinesis EF 45%     March 21, 2018 cardiac catheterization conclusion     Previously placed Prox LAD to Mid LAD stent (unknown type) is widely patent.  The left ventricular systolic function is normal.  LV end diastolic pressure is normal.  The left ventricular ejection fraction is 50-55% by visual estimate.  There is no mitral valve regurgitation.   Preserved global LV contractility with a small region of very mild residual mid anterolateral hypocontractility.  Ejection fraction 50-55%.  No significant coronary obstructive disease with evidence for widely patent mid LAD stent after the takeoff of a first diagonal vessel, normal left circumflex and normal dominant RCA.  RECOMMENDATION: Medical therapy.  Continue DAPT in this patient status post anterior ST segment MI August 2018.  Optimize blood pressure.  Continue high potency statin therapy.       ASSESSMENT:    1. Coronary artery disease involving native coronary artery of native heart without angina pectoris   2. Essential hypertension   3. Mixed hyperlipidemia   4. Chronic GERD   5. Type 2 diabetes mellitus with complication, without long-term current use of insulin ([place])   6. Mild obesity      PLAN:  Patrick [name] is a  62 year old gentleman who has a history of hypertension, hyperlipidemia, and diabetes mellitus. On 08/04/2017 he presented with an acute anterior wall ST segment elevation myocardial infarctions and was found to have total occlusion of his LAD after the first diagonal vessel.  He was found to have a long occluded segment which was successfully treated with DES stenting with a 3.038 mm Resolute DES stent.   When I saw for initial posthospital evaluation his BMI was mildly increased at 31.36.  Weight reduction was recommended.  He also has mild central adiposity and discussed with him that this type of fat typically is associated with greater levels of inflammation. His blood pressure was elevated and I titrated his medications. A nuclear perfusion study suggested the possibility of ischemia involving both the anterior and inferior wall.  EF was 45% and there was diffuse hypokinesis.  With his blood pressure elevation, and nuclear study suggestive of ischemia, I added amlodipine 5 mg to his regimen and further titrated metoprolol to 37.5 mg twice a day.  He underwent  definitive repeat cardiac catheterization which revealed a widely patent mid LAD stent and a normal left circumflex and dominant RCA.  When I last saw him, his blood pressure was elevated and I further titrated amlodipine to 10 mg daily and also recommended he increase metoprolol to 50 mg twice a day.  Apparently recently he is only been taking the metoprolol 25 mg twice a day.  I reviewed his recent laboratory from September 19, 2018.  Lipid studies were remarkably improved and triglycerides were down to 88.  Apparently he never had of a CPAP filled due to insurance delay but he has been taking a triple strength omega-3 fatty acid that has 900 mg of EPA and 600 mg of DHA.  Since he is 14 months following his acute coronary syndrome, I have suggested reducing Brilinta to 60 mg twice a day from his present dose of 90 twice daily per Pegasys trial data.   His blood pressure today continues to be mildly elevated despite amlodipine 10 mg, losartan 100 mg and I again have recommended titration of metoprolol to at least 37.5 mg twice a day again.  He continues to be on metformin and glipizide for his diabetes mellitus but is now participating in a study where he is receiving oral Victoza rather than the currently marketable injectable version.  His GERD is controlled with Protonix.  BMI is increased to 32.14. I discussed weight loss and increased exercise.  I will see him in 6 months for reevaluation   tedication Adjustments/Labs and Tests Ordered: Current medicines are reviewed at length with the patient today.  Concerns regarding medicines are outlined above.  Medication changes, Labs and Tests ordered today are listed in the Patient Instructions below. Patient Instructions  Medication Instructions:  Decrease Brilinta to 60 mg two times daily Increase metoprolol to 37.5 mg two times daily  Follow-Up: At [facility], you and your health needs are our priority.  As part of our continuing mission to provide you with exceptional heart care, we have created designated Provider Care Teams.  These Care Teams include your primary Cardiologist (physician) and Advanced Practice Providers (APPs -  Physician Assistants and Nurse Practitioners) who all work together to provide you with the care you need, when you need it. You will need a follow up appointment in 6 months.  Please call our office 2 months in advance to schedule this appointment.  You may see Shelva Majestic, MD or one of the following Advanced Practice Providers on your designated Care Team: [place], [place] . Fabian Sharp, PA-C      Signed, Shelva Majestic, MD  10/19/2018 6:59 PM    [facility] [address] [place], [place], [place]  [address] Phone: [phone]

## 2018-10-19 ENCOUNTER — Encounter: Payer: Self-pay | Admitting: Cardiovascular Disease

## 2018-10-30 ENCOUNTER — Other Ambulatory Visit: Payer: Self-pay | Admitting: Cardiovascular Disease

## 2018-11-01 ENCOUNTER — Other Ambulatory Visit: Payer: Self-pay

## 2018-11-01 MED ORDER — ASPIRIN 81 MG PO TBEC: 81.0000 mg | DELAYED_RELEASE_TABLET | Freq: Every day | ORAL | 0 refills | Status: DC

## 2018-12-12 ENCOUNTER — Telehealth: Payer: Self-pay | Admitting: *Deleted

## 2018-12-12 NOTE — Telephone Encounter (Signed)
PA submitted for Brilinta 60 mg BID via [employer], awaiting review

## 2018-12-13 NOTE — Telephone Encounter (Signed)
[facility]. No PA needed. Medication covered by plan

## 2018-12-17 ENCOUNTER — Other Ambulatory Visit: Payer: Self-pay | Admitting: Cardiovascular Disease

## 2019-01-02 ENCOUNTER — Other Ambulatory Visit: Payer: Self-pay | Admitting: Cardiovascular Disease

## 2019-01-02 DIAGNOSIS — E782 Mixed hyperlipidemia: Secondary | ICD-10-CM

## 2019-04-13 ENCOUNTER — Telehealth: Payer: Self-pay | Admitting: Cardiovascular Disease

## 2019-04-13 NOTE — Telephone Encounter (Signed)
LMTCB to reschedule appointment if no problems. Currently scheduled with Dr. Tresa Endo on 4/28 at 4 pm.

## 2019-04-14 NOTE — Telephone Encounter (Signed)
Spoke to pt who stated he has not been having any problems or cardiac symptoms. He agreed to reschedule appointment. Pt rescheduled to 08/21/19 at 3:40 PM with Dr. Tresa Endo.

## 2019-04-18 ENCOUNTER — Ambulatory Visit: Payer: Self-pay | Admitting: Cardiovascular Disease

## 2019-05-17 ENCOUNTER — Other Ambulatory Visit: Payer: Self-pay | Admitting: Cardiovascular Disease

## 2019-06-01 ENCOUNTER — Other Ambulatory Visit: Payer: Self-pay | Admitting: Cardiovascular Disease

## 2019-08-21 ENCOUNTER — Telehealth: Payer: Self-pay | Admitting: Cardiovascular Disease

## 2019-09-15 ENCOUNTER — Telehealth: Payer: Self-pay | Admitting: Cardiovascular Disease

## 2019-09-15 MED ORDER — LOSARTAN POTASSIUM 100 MG PO TABS: 100.0000 mg | ORAL_TABLET | Freq: Every day | ORAL | 2 refills | Status: DC

## 2019-09-15 NOTE — Telephone Encounter (Signed)
New message   Patient needs a new prescription for losartan (COZAAR) 100 MG tablet sent to [place], [place] - [address] AT [facility] [address] [facility]  Patient needs a 90 day prescription.

## 2019-09-15 NOTE — Telephone Encounter (Signed)
Ov in November, Refill sent to the pharmacy electronically.

## 2019-09-26 ENCOUNTER — Ambulatory Visit (INDEPENDENT_AMBULATORY_CARE_PROVIDER_SITE_OTHER): Payer: Medicare Other | Admitting: Urology

## 2019-09-26 ENCOUNTER — Encounter: Payer: Self-pay | Admitting: Urology

## 2019-09-26 ENCOUNTER — Other Ambulatory Visit: Payer: Self-pay

## 2019-09-26 VITALS — BP 150/83 | HR 84 | Ht 72.0 in | Wt 236.0 lb

## 2019-09-26 DIAGNOSIS — N398 Other specified disorders of urinary system: Secondary | ICD-10-CM | POA: Diagnosis not present

## 2019-09-26 DIAGNOSIS — R972 Elevated prostate specific antigen [PSA]: Secondary | ICD-10-CM | POA: Diagnosis not present

## 2019-09-26 DIAGNOSIS — Z87898 Personal history of other specified conditions: Secondary | ICD-10-CM | POA: Diagnosis not present

## 2019-09-26 DIAGNOSIS — R351 Nocturia: Secondary | ICD-10-CM

## 2019-09-26 MED ORDER — MIRABEGRON ER 25 MG PO TB24: 25.0000 mg | ORAL_TABLET | Freq: Every day | ORAL | 11 refills | Status: DC

## 2019-09-26 NOTE — Progress Notes (Signed)
09/26/2019 5:04 PM   Patrick [name] 1956/04/21 [id number]  Referring provider: Lauro RegulusAnderson, Marshall W, MD [address] [facility] [place] [id number]  Chief Complaint  Patient presents with  . Follow-up    HPI: 63 year old male who returns today for routine annual follow-up of voiding dysfunction with history of elevated PSA.  Notably, the patient has urinary frequency as well as stopping starting of his urinary stream.  He is documented increased EMG activity with all voids from urodynamics in 2018.  He benefited from physical therapy in the past.  No evidence of bladder outlet obstruction on his urodynamics.  Today, he reports his symptoms are overall fairly stable.  He is fallen off the wagon again doing his pelvic floor exercises.  His daytime symptoms are fairly well controlled on Flomax but he continues to have significant nighttime symptoms primarily.  He gets up several times at night to void and when he does, his stream stops and starts.  He does stand to void.  He does not snore.  He does have some mild lower extremity edema.  He is never had a sleep study.  In terms of overactivity, he is tried multiple medications in the past including Toviaz, Myrbetriq and oxybutynin.  He does have dementia but cannot afford this medication.  He is now on different insurance like to try this medication again.  No dysuria or gross hematuria.  No UTIs.  She also has a personal history of elevated PSA.  We have received records from Dr. Amada JupiterWolff's office dating back to 2004 which time his PSA was 6.8. It appears that he had chest biopsy at this time. He also had a biopsy in 2008 when his PSA 6.3. His PSA appears to be fluctuating ranging anywhere from 1.6as high as 6.8. We do not have the results of his previous biopsies but he reports that these are negative.   PSA today is pending.  He is due for rectal exam.    IPSS    Row Name 09/26/19 1500          International Prostate Symptom Score   How often have you had the sensation of not emptying your bladder?  Less than half the time     How often have you had to urinate less than every two hours?  Almost always     How often have you found you stopped and started again several times when you urinated?  About half the time     How often have you found it difficult to postpone urination?  Less than half the time     How often have you had a weak urinary stream?  Less than 1 in 5 times     How often have you had to strain to start urination?  Less than 1 in 5 times     How many times did you typically get up at night to urinate?  3 Times     Total IPSS Score  17       Quality of Life due to urinary symptoms   If you were to spend the rest of your life with your urinary condition just the way it is now how would you feel about that?  Mixed        Score:  1-7 Mild 8-19 Moderate 20-35 Severe   PMH: Past Medical History:  Diagnosis Date  . CAD in native artery    a. anterolateral STEMI 07/2017 s/p  DES to prox-mid LAD, EF 55%.  Marland Kitchen GERD (gastroesophageal reflux disease)   . Hyperlipidemia   . Hypertension   . Overactive bladder   . Renal calculi   . Uncontrolled diabetes mellitus [facility])     Surgical History: Past Surgical History:  Procedure Laterality Date  . CORONARY/GRAFT ACUTE MI REVASCULARIZATION N/A 08/04/2017   Procedure: Coronary/Graft Acute MI Revascularization;  Surgeon: Troy Sine, MD;  Location: [place] [facility];  Service: Cardiovascular;  Laterality: N/A;  . LEFT HEART CATH AND CORONARY ANGIOGRAPHY N/A 08/04/2017   Procedure: LEFT HEART CATH AND CORONARY ANGIOGRAPHY;  Surgeon: Troy Sine, MD;  Location: [place] [facility];  Service: Cardiovascular;  Laterality: N/A;  . LEFT HEART CATH AND CORONARY ANGIOGRAPHY N/A 03/21/2018   Procedure: LEFT HEART CATH AND CORONARY ANGIOGRAPHY;  Surgeon: Troy Sine, MD;  Location: [place] [facility];  Service: Cardiovascular;   Laterality: N/A;  . PROSTATE SURGERY      Home Medications:  Allergies as of 09/26/2019   No Known Allergies     Medication List       Accurate as of September 26, 2019  5:04 PM. If you have any questions, ask your nurse or doctor.        STOP taking these medications   albuterol 108 (90 Base) MCG/ACT inhaler Commonly known as: VENTOLIN HFA Stopped by: Hollice Espy, MD     TAKE these medications   ABC PLUS SENIOR PO Take 1 tablet by mouth daily.   amLODipine 10 MG tablet Commonly known as: NORVASC TAKE 1 TABLET(10 MG) BY MOUTH DAILY   aspirin 81 MG EC tablet Commonly known as: Aspirin Low Dose Take 1 tablet (81 mg total) by mouth daily.   atorvastatin 40 MG tablet Commonly known as: LIPITOR TAKE 1 TABLET(40 MG) BY MOUTH EVERY EVENING   Coenzyme Q10 200 MG capsule Take 200 mg by mouth daily.   glimepiride 2 MG tablet Commonly known as: AMARYL Take 2 mg by mouth daily with breakfast.   GLUCOSAMINE 1500 COMPLEX PO Take 1,500 mg by mouth daily.   losartan 100 MG tablet Commonly known as: COZAAR Take 1 tablet (100 mg total) by mouth daily.   Magnesium 250 MG Tabs Take 250 mg by mouth daily.   Magnesium Citrate 100 MG Tabs Take 100 mg by mouth daily.   metformin 500 MG (OSM) 24 hr tablet Commonly known as: FORTAMET Take 2,000 mg by mouth 2 (two) times daily with a meal.   metoprolol tartrate 25 MG tablet Commonly known as: LOPRESSOR Take 1.5 tablets (37.5 mg total) by mouth 2 (two) times daily.   mirabegron ER 25 MG Tb24 tablet Commonly known as: MYRBETRIQ Take 1 tablet (25 mg total) by mouth daily. Started by: Hollice Espy, MD   nitroGLYCERIN 0.4 MG SL tablet Commonly known as: NITROSTAT Place 1 tablet (0.4 mg total) under the tongue every 5 (five) minutes x 3 doses as needed for chest pain.   pantoprazole 40 MG tablet Commonly known as: PROTONIX Take 40 mg by mouth daily.   Potassium 99 MG Tabs Take 99 mg by mouth daily.   pyridOXINE 50 MG  tablet Commonly known as: VITAMIN B-6 Take 50 mg by mouth daily.   tamsulosin 0.4 MG Caps capsule Commonly known as: FLOMAX Take 1 capsule (0.4 mg total) by mouth daily.   ticagrelor 60 MG Tabs tablet Commonly known as: Brilinta TAKE 1 TABLET(90 MG) BY MOUTH TWICE DAILY   vitamin C 500 MG tablet  Commonly known as: ASCORBIC ACID Take 500 mg by mouth daily.   vitamin E 400 UNIT capsule Take 400 Units by mouth daily.       Allergies: No Known Allergies  Family History: Family History  Problem Relation Age of Onset  . Heart disease Father   . Heart attack Father     Social History:  reports that he has never smoked. He has never used smokeless tobacco. He reports that he does not drink alcohol or use drugs.  ROS: UROLOGY Frequent Urination?: Yes Hard to postpone urination?: Yes Burning/pain with urination?: No Get up at night to urinate?: Yes Leakage of urine?: Yes Urine stream starts and stops?: Yes Trouble starting stream?: No Do you have to strain to urinate?: No Blood in urine?: No Urinary tract infection?: No Sexually transmitted disease?: No Injury to kidneys or bladder?: No Painful intercourse?: No Weak stream?: No Erection problems?: No Penile pain?: No  Gastrointestinal Nausea?: No Vomiting?: No Indigestion/heartburn?: No Diarrhea?: No Constipation?: No  Constitutional Fever: No Night sweats?: No Weight loss?: No Fatigue?: No  Skin Skin rash/lesions?: No Itching?: No  Eyes Blurred vision?: No Double vision?: No  Ears/Nose/Throat Sore throat?: No Sinus problems?: No  Hematologic/Lymphatic Swollen glands?: No Easy bruising?: No  Cardiovascular Leg swelling?: No Chest pain?: No  Respiratory Cough?: No Shortness of breath?: No  Endocrine Excessive thirst?: No  Musculoskeletal Back pain?: No Joint pain?: No  Neurological Headaches?: No Dizziness?: No  Psychologic Depression?: No Anxiety?: No  Physical Exam: BP  (!) 150/83   Pulse 84   Ht 6' (1.829 m)   Wt 236 lb (107 kg)   BMI 32.01 kg/m   Constitutional:  Alert and oriented, No acute distress. HEENT: [place] AT, moist mucus membranes.  Trachea midline, no masses. Cardiovascular: No clubbing, cyanosis, or edema. Respiratory: Normal respiratory effort, no increased work of breathing. GI: Abdomen is soft, nontender, nondistended, no abdominal masses, obese Rectal: Normal sphincter tone, 50 cc prostate, nontender, no nodules Skin: No rashes, bruises or suspicious lesions. Neurologic: Grossly intact, no focal deficits, moving all 4 extremities. Psychiatric: Normal mood and affect.  Laboratory Data: Lab Results  Component Value Date   WBC 6.5 03/16/2018   HGB 15.4 03/16/2018   HCT 44.0 03/16/2018   MCV 87 03/16/2018   PLT 208 03/16/2018    Lab Results  Component Value Date   CREATININE 1.09 03/16/2018   PSA pending  Lab Results  Component Value Date   HGBA1C 9.8 (H) 08/05/2017    Urinalysis UA today reviewed, negative   Assessment & Plan:    1. History of elevated PSA PSA pending Continue routine annual surveillance Rectal exam up-to-date, unremarkable  2.  Pelvic floor dysfunction Has benefited from a physical therapy in the past, encouraged again today to resume these exercises and relaxation techniques as they have been beneficial in the past  3.  Nocturia Likely multifactorial Suspected nighttime, he is not able to relax his pelvic floor as easily, suggested considering sitting to void at nighttime Previously did well on Myrbetriq 25 mg, will order this again today and see if he can afford it with insurance change Continue Flomax  Return in about 1 year (around 09/25/2020) for IPSS/ PVR/ UA, PSA/ DRE.  Vanna Scotland, MD  [facility] [address], [address] [place], [place] [address] [phone]

## 2019-09-27 ENCOUNTER — Telehealth: Payer: Self-pay | Admitting: *Deleted

## 2019-09-27 LAB — PSA: Prostate Specific Ag, Serum: 3.2 ng/mL (ref 0.0–4.0)

## 2019-09-27 NOTE — Telephone Encounter (Addendum)
  Patient informed-verbalized understanding.  ----- Message from Hollice Espy, MD sent at 09/27/2019  8:30 AM EDT ----- PSA is stable at 3.2  Great news.  Hollice Espy, MD

## 2019-10-11 ENCOUNTER — Telehealth (INDEPENDENT_AMBULATORY_CARE_PROVIDER_SITE_OTHER): Payer: Medicare Other | Admitting: Cardiovascular Disease

## 2019-10-11 DIAGNOSIS — K219 Gastro-esophageal reflux disease without esophagitis: Secondary | ICD-10-CM

## 2019-10-11 DIAGNOSIS — E118 Type 2 diabetes mellitus with unspecified complications: Secondary | ICD-10-CM | POA: Diagnosis not present

## 2019-10-11 DIAGNOSIS — E669 Obesity, unspecified: Secondary | ICD-10-CM

## 2019-10-11 DIAGNOSIS — I25119 Atherosclerotic heart disease of native coronary artery with unspecified angina pectoris: Secondary | ICD-10-CM | POA: Diagnosis not present

## 2019-10-11 DIAGNOSIS — E785 Hyperlipidemia, unspecified: Secondary | ICD-10-CM | POA: Diagnosis not present

## 2019-10-11 DIAGNOSIS — I10 Essential (primary) hypertension: Secondary | ICD-10-CM | POA: Diagnosis not present

## 2019-10-11 MED ORDER — ISOSORBIDE MONONITRATE ER 30 MG PO TB24: 30.0000 mg | ORAL_TABLET | Freq: Every day | ORAL | 3 refills | Status: DC

## 2019-10-11 MED ORDER — METOPROLOL TARTRATE 50 MG PO TABS: 50.0000 mg | ORAL_TABLET | Freq: Two times a day (BID) | ORAL | 1 refills | Status: DC

## 2019-10-11 MED ORDER — NITROGLYCERIN 0.4 MG SL SUBL: 0.4000 mg | SUBLINGUAL_TABLET | SUBLINGUAL | 3 refills | Status: DC | PRN

## 2019-10-11 NOTE — Patient Instructions (Signed)
Medication Instructions:  Increase Metoprolol 50 mg twice daily. Start Isosorbide 30 mg daily. *If you need a refill on your cardiac medications before your next appointment, please call your pharmacy*  Testing/Procedures: Your physician has requested that you have an [place]. A cardiac stress test is a cardiological test that measures the heart's ability to respond to external stress in a controlled clinical environment. The stress response is induced by chemicals. For further information please visit [email or web address]. If you have questions or concerns about your appointment, you can call the Nuclear Lab at [phone].    Follow-Up: At [facility], you and your health needs are our priority.  As part of our continuing mission to provide you with exceptional heart care, we have created designated Provider Care Teams.  These Care Teams include your primary Cardiologist (physician) and Advanced Practice Providers (APPs -  Physician Assistants and Nurse Practitioners) who all work together to provide you with the care you need, when you need it.  Your next appointment:   Keep follow up in November  The format for your next appointment:   In Person  Provider:   Shelva Majestic, MD

## 2019-10-11 NOTE — Progress Notes (Signed)
Virtual Visit via Video Note   This visit type was conducted due to national recommendations for restrictions regarding the COVID-19 Pandemic (e.g. social distancing) in an effort to limit this patient's exposure and mitigate transmission in our community.  Due to his co-morbid illnesses, this patient is at least at moderate risk for complications without adequate follow up.  This format is felt to be most appropriate for this patient at this time.  All issues noted in this document were discussed and addressed.  A limited physical exam was performed with this format.  Please refer to the patient's chart for his consent to telehealth for [facility].   Date:  10/11/2019   ID:  Patrick [name], DOB August 02, 1956, MRN [id number]  Patient Location: Home Provider Location: Home  PCP:  Lauro Regulus, MD  Cardiologist:  Nicki Guadalajara, MD  Electrophysiologist:  None   Evaluation Performed:  Follow-Up Visit  Chief Complaint: 1 year evaluation Exertional chest pain  History of Present Illness:    Patrick [name] is a 63 y.o. male who suffered an anterior ST segment elevation myocardial infarction on 08/04/2017.   I saw him for initial office visit in November 2018.  I last saw him in October  2019.  He presents for 12 month follow-up evaluation.  Mr. Patrick [name] has a history of hypertension, hyperlipidemia, and diabetes mellitus.  He was admitted on 08/04/2017 with ST segment elevation anterolaterally and a code STEMI was activated.  I performed emergent cardiac catheterization which revealed total occlusion of his LAD after the first diagonal vessel.  There was a long segment of occlusion and ultimately a Resolute on a 3.038 mm stent was inserted, postdilated 3.25 mm with 100% occlusion being reduced to 0% and TIMI 0 flow been improved to TIMI 3 flow.  He developed transient heart block during the procedure which required IV atropine.  Of note, his lipid panel during that evaluation was consistent  with an atherogenic dyslipidemic pattern with triglycerides of 370, VLDL 74, low HDL at 29, and his total cholesterol was 204 with LDL 101.  He was started on atorvastatin and also was on fenofibrate.  Subsequent, please fenofibrate was discontinued by his primary physician and he has been taking over-the-counter fish oil.  Repeat blood work 1 month later by his primary physician showed a total cholesterol 124, LDL 47, triglycerides were 251, HDL was 26.5.  Over the past several months, he has done well.  He denies any definitive recurrent anginal symptomatology.  He had experienced 1 vague episode of  similar sensation after working 16 hours a day for 3 days and having to go to another business trip in [place].    When I  saw him he had noticed some occasional episodes of heartburn sensation, but also has noted this with mild exertion.  He was seen by Joni Reining, NP on 02/28/2018.  Because of worsening heartburn symptoms and fatigue, which seemed nitrate responsive she recommended a follow-up nuclear perfusion study for reassessment.  His nuclear study  on 03/10/2018 was interpreted as a high risk study suggested ischemia involving both the inferior as well as anterior wall.  There was diffuse hypokinesis.  EF was 45%.  When I saw him in follow-up of the nuclear study I recommended definitive cardiac catheterization.  Cardiac catheterization was done on March 21, 2018..  This revealed preserved global LV contractility with a small region of very mild residual mid anterolateral hypocontractility.  Ejection fraction was 50-55%.  The stent  in the mid LAD was widely patent and his circumflex and dominant RCA were normal.  Medical therapy was recommended and optimization of blood pressure.  Due to lipid studies that were significantly elevated in August 2018 I recommended the addition of his Vascepa to his atorvastatin.   I saw him in May 2019 and last saw him in October 2019.  At that time he continued to  do well.  At the time, he was on amlodipine 10 mg and metoprolol 25 mg twice a day.  His resting pulse was 96 and I recommended titration of metoprolol tartrate to 37.5 mg twice a day.  Laboratory on September 19, 2018 at the [place] [facility]: Total cholesterol was 101, HDL 33, LDL 50, triglycerides had improved to 88.  VLDL was 18.  He is now enrolled in a study with Dr. Dareen Piano which is an oral Victoza therapy.  Hemoglobin A1c before the study was 8.3 and 1 week ago repeat hemoglobin A1c was 6.8.    Since I last saw him, he states he has done well but admits that he was not routinely exercising.  3 weeks ago, he went to the beach and while walking on the beach for 10 to 15 minutes began to notice a substernal chest discomfort.  He was able to walk through it but at a slower pace.  Since that time, he has experienced some recurrent exertional chest tightness particularly if walking fast up hills.  He has not tried nitroglycerin.  He denies any rest discomfort.  He denies any palpitations.  He denies PND orthopnea.  He had recently seen Dr. Dareen Piano.  He tells me during his recent evaluation his blood pressure was 131/71 and his pulse was 68.  Laboratory revealed a total cholesterol 109, triglycerides 218, VLDL 44, HDL 29.9, and LDL 36.  He now presents for follow-up Cardiologic evaluation in light of this recurrent exertional chest pain symptomatology.   The patient does not have symptoms concerning for COVID-19 infection (fever, chills, cough, or new shortness of breath).    Past Medical History:  Diagnosis Date   CAD in native artery    a. anterolateral STEMI 07/2017 s/p DES to prox-mid LAD, EF 55%.   GERD (gastroesophageal reflux disease)    Hyperlipidemia    Hypertension    Overactive bladder    Renal calculi    Uncontrolled diabetes mellitus (HCC)    Past Surgical History:  Procedure Laterality Date   CORONARY/GRAFT ACUTE MI REVASCULARIZATION N/A 08/04/2017   Procedure:  Coronary/Graft Acute MI Revascularization;  Surgeon: Lennette Bihari, MD;  Location: [facility] INVASIVE CV LAB;  Service: Cardiovascular;  Laterality: N/A;   LEFT HEART CATH AND CORONARY ANGIOGRAPHY N/A 08/04/2017   Procedure: LEFT HEART CATH AND CORONARY ANGIOGRAPHY;  Surgeon: Lennette Bihari, MD;  Location: [facility] INVASIVE CV LAB;  Service: Cardiovascular;  Laterality: N/A;   LEFT HEART CATH AND CORONARY ANGIOGRAPHY N/A 03/21/2018   Procedure: LEFT HEART CATH AND CORONARY ANGIOGRAPHY;  Surgeon: Lennette Bihari, MD;  Location: [facility] INVASIVE CV LAB;  Service: Cardiovascular;  Laterality: N/A;   PROSTATE SURGERY       Current Meds  Medication Sig   amLODipine (NORVASC) 10 MG tablet TAKE 1 TABLET(10 MG) BY MOUTH DAILY   aspirin (ASPIRIN LOW DOSE) 81 MG EC tablet Take 1 tablet (81 mg total) by mouth daily.   atorvastatin (LIPITOR) 40 MG tablet TAKE 1 TABLET(40 MG) BY MOUTH EVERY EVENING   Coenzyme Q10 200 MG capsule Take 200 mg  by mouth daily.   glimepiride (AMARYL) 2 MG tablet Take 2 mg by mouth daily with breakfast.   Glucosamine-Chondroit-Vit C-Mn (GLUCOSAMINE 1500 COMPLEX PO) Take 1,500 mg by mouth daily.   losartan (COZAAR) 100 MG tablet Take 1 tablet (100 mg total) by mouth daily.   Magnesium 250 MG TABS Take 250 mg by mouth daily.   Magnesium Citrate 100 MG TABS Take 100 mg by mouth daily.   metformin (FORTAMET) 500 MG (OSM) 24 hr tablet Take 2,000 mg by mouth 2 (two) times daily with a meal.    metoprolol tartrate (LOPRESSOR) 25 MG tablet Take 1.5 tablets (37.5 mg total) by mouth 2 (two) times daily.   mirabegron ER (MYRBETRIQ) 25 MG TB24 tablet Take 1 tablet (25 mg total) by mouth daily.   Multiple Vitamins-Minerals (ABC PLUS SENIOR PO) Take 1 tablet by mouth daily.   nitroGLYCERIN (NITROSTAT) 0.4 MG SL tablet Place 1 tablet (0.4 mg total) under the tongue every 5 (five) minutes x 3 doses as needed for chest pain.   pantoprazole (PROTONIX) 40 MG tablet Take 40 mg by mouth daily.    Potassium 99 MG TABS Take 99 mg by mouth daily.   pyridOXINE (VITAMIN B-6) 50 MG tablet Take 50 mg by mouth daily.   tamsulosin (FLOMAX) 0.4 MG CAPS capsule Take 1 capsule (0.4 mg total) by mouth daily.   ticagrelor (BRILINTA) 60 MG TABS tablet TAKE 1 TABLET(90 MG) BY MOUTH TWICE DAILY   vitamin C (ASCORBIC ACID) 500 MG tablet Take 500 mg by mouth daily.   vitamin E 400 UNIT capsule Take 400 Units by mouth daily.     Allergies:   Patient has no known allergies.   Social History   Tobacco Use   Smoking status: Never Smoker   Smokeless tobacco: Never Used  Substance Use Topics   Alcohol use: No   Drug use: No     Family Hx: The patient's family history includes Heart attack in his father; Heart disease in his father.  ROS:   Please see the history of present illness.    No fevers chills or night sweats. No wheezing or difficulty breathing Mild exertional chest tightness precipitated with 10 to 15 minutes of walking, resolved with rest GERD, controlled Diabetes mellitus Hyperlipidemia No edema No neurologic symptoms Sleeping well All other systems reviewed and are negative.   Prior CV studies:   The following studies were reviewed today:  Emergent cardiac catheterization/PCI 08/04/2017: Conclusion     LV end diastolic pressure is mildly elevated.  The left ventricular systolic function is normal.  A STENT RESOLUTE [id number] drug eluting stent was successfully placed.  Prox LAD to Mid LAD lesion, 100 %stenosed.  Post intervention, there is a 0% residual stenosis.  Acute ST segment elevation myocardial infarction secondary to total occlusion of the LAD immediately after the takeoff of the first diagonal and septal perforating artery. There was TIMI 0 flow.  Mild luminal irregularity of the left circumflex vessel without significant obstructive stenoses. Dominant normal RCA.  Successful PCI to the totally occluded LAD which resulted in a long  segment of occlusion and ultimately treated with PTCA and DES stenting with a Resolute onyx 3.038 mm stent postdilated to 3.25 mm with 100% occlusion being reduced to 0% and TIMI 0 flow being improved to TIMI 3 flow.  Transient heart block treated with IV atropine.  Preserved global LV contractility with subtle apical inferior focal hypocontractility.  The patient presented to [facility] emergency room at ~  8:45 AM. He presented to [facility] catheterization lab at approximately 10:45 AM. During balloon time from presentation to [facility] catheterization laboratory was ~ 20 minutes.  RECOMMENDATION: The patient will continue with dual antiplatelet therapy for minimum of a year. He will be started on high potency statin therapy, low-dose beta blocker therapy, with ultimate ACE inhibition. Metformin will be held for 48 hours postcontrast.    03/10/2018 Nuclear Study Highlights     The left ventricular ejection fraction is mildly decreased (45-54%).  Nuclear stress EF: 45%.  There was no ST segment deviation noted during stress.  Findings consistent with ischemia.  This is a high risk study.  TID 1.3 Normalization artifact likely but appears to be both inferior and anterior ischemia Diffuse hypokinesis EF 45%     March 21, 2018 cardiac catheterization conclusion     Previously placed Prox LAD to Mid LAD stent (unknown type) is widely patent.  The left ventricular systolic function is normal.  LV end diastolic pressure is normal.  The left ventricular ejection fraction is 50-55% by visual estimate.  There is no mitral valve regurgitation.  Preserved global LV contractility with a small region of very mild residual mid anterolateral hypocontractility. Ejection fraction 50-55%.  No significant coronary obstructive disease with evidence for widely patent mid LAD stent after the takeoff of a first diagonal vessel, normal left circumflex and normal dominant  RCA.  RECOMMENDATION: Medical therapy. Continue DAPT in this patient status post anterior ST segment MI August 2018. Optimize blood pressure. Continue high potency statin therapy.      Labs/Other Tests and Data Reviewed:    I personally reviewed his most recent ECG by me from October 17, 2018 ECG (independently read by me): Normal sinus rhythm at 93 bpm.  Right bundle branch block with repolarization changes.  QTc interval 44 ms.  Apr 25, 2018 ECG (independently read by me): Normal sinus rhythm at 73 bpm.  Right bundle branch block with repolarization changes.  March 16, 2018 ECG (independently read by me): NSR at 63;RBBB with repolarization changes QTc 431 msec  November 2018 ECG (independently read by me): Normal sinus rhythm at 66 bpm.  Right bundle branch block with repolarization changes.  QTc interval 448 ms.   Recent Labs: No results found for requested labs within last 8760 hours.   Recent Lipid Panel Lab Results  Component Value Date/Time   CHOL 204 (H) 08/04/2017 10:05 AM   TRIG 370 (H) 08/04/2017 10:05 AM   HDL 29 (L) 08/04/2017 10:05 AM   CHOLHDL 7.0 08/04/2017 10:05 AM   LDLCALC 101 (H) 08/04/2017 10:05 AM    Wt Readings from Last 3 Encounters:  10/11/19 237 lb (107.5 kg)  09/26/19 236 lb (107 kg)  10/17/18 237 lb (107.5 kg)     Objective:    Vital Signs:  Ht 6' (1.829 m)    Wt 237 lb (107.5 kg)    BMI 32.14 kg/m    He did not have a blood pressure cuff at home and I could not his blood pressure today but recent blood pressure taken by his primary physician was 131/71 with a pulse of 68.  In this video virtual visit, he appeared unchanged physically. Breathing was normal not labored There was no apparent JVD There was no chest tightness or pressure. He denied abdominal problems There was no swelling He appeared neurologically intact He had normal cognition and affect   ASSESSMENT & PLAN:    1. CAD/history of acute ST segment elevation  MI  secondary to total occlusion of the LAD August 04, 2017.  He underwent successful stenting of a long segment occlusion of his LAD after the first diagonal vessel with insertion of a 3.0 x 38 mm Resolute stent.  He had mild nonobstructive concomitant CAD of the circumflex vessel.  Repeat cardiac catheterization on March 21, 2018 after an abnormal nuclear study showed a widely patent stent in a normal circumflex and dominant RCA vessel.  He has continued on DAPT therapy and is now on low-dose Brilinta 60 mg twice a day/aspirin 81 mg..  He has recently mild developed exertional chest discomfort that he first noticed 3 weeks ago while walking on sand at the beach.  He has experienced some mild recurrent episodes with fast walking uphill.  Presently, I am recommending slight titration of metoprolol to 50 mg twice a day, he will continue amlodipine 10 mg daily and I will also initiate isosorbide 30 mg and renew a prescription for sublingual nitroglycerin.  I will schedule him to undergo an nuclear perfusion study over the next several weeks for reevaluation and further assessment of potential ischemia. 2. Essential hypertension: Recent blood pressures have been stable.  With his recurrent anginal symptomatology I will see slightly increase metoprolol to 50 twice daily and initiate isosorbide 30 mg.  We will continue current regimen of amlodipine 10 mg daily in addition to losartan 100 mg.  He will monitor his blood pressure if possible. 3. Hyperlipidemia: Most recent laboratory was reviewed from Dr. Dareen PianoAnderson.  He has been on atorvastatin 40 mg daily and over-the-counter fish oil or 2 capsules equal 900 mg of EPA and 600 mg of DHA.  I have recommended he further titrate his fish oil to 2 capsules twice a day. 4. Type 2 diabetes mellitus, currently on a study involving Victoza.  He also has been on metformin and Amaryl.  He tells me his hemoglobin A1c's have been reduced as result of study drug treatment. 5. GERD:  Controlled on Pantoloc prize I will 6. Mild obesity: BMI 32  COVID-19 Education: The signs and symptoms of COVID-19 were discussed with the patient and how to seek care for testing (follow up with PCP or arrange E-visit).  The importance of social distancing was discussed today.  Time:   Today, I have spent 25 minutes with the patient with telehealth technology discussing the above problems.     Medication Adjustments/Labs and Tests Ordered: Current medicines are reviewed at length with the patient today.  Concerns regarding medicines are outlined above.   Tests Ordered: No orders of the defined types were placed in this encounter.   Medication Changes: No orders of the defined types were placed in this encounter.   Follow Up: I will see the patient in several weeks in follow-up of his medication adjustments and nuclear stress test.  Hehas an appointment for November 17.  This should be an in office visit.  Signed, Nicki Guadalajarahomas Marigny Borre, MD  10/11/2019 11:00 AM    [facility]

## 2019-10-14 ENCOUNTER — Other Ambulatory Visit: Payer: Self-pay | Admitting: Urology

## 2019-10-14 DIAGNOSIS — R350 Frequency of micturition: Secondary | ICD-10-CM

## 2019-10-17 ENCOUNTER — Telehealth (HOSPITAL_COMMUNITY): Payer: Self-pay

## 2019-10-17 NOTE — Telephone Encounter (Signed)
Encounter complete. 

## 2019-10-19 ENCOUNTER — Ambulatory Visit (HOSPITAL_COMMUNITY)
Admission: RE | Admit: 2019-10-19 | Discharge: 2019-10-19 | Disposition: A | Discharge status: Home / Self Care | Payer: Medicare Other | Source: Ambulatory Visit | Attending: Cardiology | Admitting: Cardiology

## 2019-10-19 ENCOUNTER — Other Ambulatory Visit: Payer: Self-pay

## 2019-10-19 DIAGNOSIS — I10 Essential (primary) hypertension: Secondary | ICD-10-CM | POA: Insufficient documentation

## 2019-10-19 DIAGNOSIS — I25119 Atherosclerotic heart disease of native coronary artery with unspecified angina pectoris: Secondary | ICD-10-CM | POA: Insufficient documentation

## 2019-10-19 LAB — MYOCARDIAL PERFUSION IMAGING
LV dias vol: 110 mL (ref 62–150)
LV sys vol: 45 mL
Peak HR: 93 {beats}/min
Rest HR: 65 {beats}/min
SDS: 2
SRS: 3
SSS: 5
TID: 1.2

## 2019-10-19 MED ORDER — TECHNETIUM TC 99M TETROFOSMIN IV KIT
29.9000 | PACK | Freq: Once | INTRAVENOUS | Status: AC | PRN
  Administered 2019-10-19: 32.6 via INTRAVENOUS
  Filled 2019-10-19: qty 30

## 2019-10-19 MED ORDER — REGADENOSON 0.4 MG/5ML IV SOLN
0.4000 mg | Freq: Once | INTRAVENOUS | Status: AC
  Administered 2019-10-19: 0.4 mg via INTRAVENOUS

## 2019-10-19 MED ORDER — TECHNETIUM TC 99M TETROFOSMIN IV KIT
9.4000 | PACK | Freq: Once | INTRAVENOUS | Status: AC | PRN
  Administered 2019-10-19: 9.8 via INTRAVENOUS
  Filled 2019-10-19: qty 10

## 2019-10-27 ENCOUNTER — Other Ambulatory Visit: Payer: Self-pay

## 2019-10-27 MED ORDER — AMLODIPINE BESYLATE 10 MG PO TABS: ORAL_TABLET | ORAL | 1 refills | Status: DC

## 2019-11-07 ENCOUNTER — Other Ambulatory Visit: Payer: Self-pay

## 2019-11-07 ENCOUNTER — Ambulatory Visit (INDEPENDENT_AMBULATORY_CARE_PROVIDER_SITE_OTHER): Payer: Medicare Other | Admitting: Cardiovascular Disease

## 2019-11-07 ENCOUNTER — Encounter: Payer: Self-pay | Admitting: Cardiovascular Disease

## 2019-11-07 VITALS — BP 125/74 | HR 72 | Temp 97.2°F | Ht 72.0 in | Wt 236.2 lb

## 2019-11-07 DIAGNOSIS — E118 Type 2 diabetes mellitus with unspecified complications: Secondary | ICD-10-CM

## 2019-11-07 DIAGNOSIS — I25118 Atherosclerotic heart disease of native coronary artery with other forms of angina pectoris: Secondary | ICD-10-CM

## 2019-11-07 DIAGNOSIS — K219 Gastro-esophageal reflux disease without esophagitis: Secondary | ICD-10-CM | POA: Diagnosis not present

## 2019-11-07 DIAGNOSIS — I10 Essential (primary) hypertension: Secondary | ICD-10-CM | POA: Diagnosis not present

## 2019-11-07 DIAGNOSIS — E785 Hyperlipidemia, unspecified: Secondary | ICD-10-CM

## 2019-11-07 DIAGNOSIS — I25119 Atherosclerotic heart disease of native coronary artery with unspecified angina pectoris: Secondary | ICD-10-CM | POA: Diagnosis not present

## 2019-11-07 DIAGNOSIS — I4510 Unspecified right bundle-branch block: Secondary | ICD-10-CM

## 2019-11-07 DIAGNOSIS — E669 Obesity, unspecified: Secondary | ICD-10-CM

## 2019-11-07 MED ORDER — ISOSORBIDE MONONITRATE ER 60 MG PO TB24: 60.0000 mg | ORAL_TABLET | Freq: Every day | ORAL | 3 refills | Status: DC

## 2019-11-07 NOTE — Patient Instructions (Signed)
Medication Instructions:  INCREASE ISOSORBIDE 60MG  DAILY  INCREASE PRIOONIX TWICE DAILY  If you need a refill on your cardiac medications before your next appointment, please call your pharmacy.  Special Instructions: INCREASE WALKING DAILY  Follow-Up: IN 4-6 WEEKS In Person Shelva Majestic, MD.    At [facility], you and your health needs are our priority.  As part of our continuing mission to provide you with exceptional heart care, we have created designated Provider Care Teams.  These Care Teams include your primary Cardiologist (physician) and Advanced Practice Providers (APPs -  Physician Assistants and Nurse Practitioners) who all work together to provide you with the care you need, when you need it.  Thank you for choosing [facility] at [facility]!!

## 2019-11-07 NOTE — Progress Notes (Signed)
Cardiology Office Note    Date:  11/13/2019   ID:  Patrick [name], DOB August 10, 1956, MRN [id number]  PCP:  Kirk Ruths, MD  Cardiologist:  Shelva Majestic, MD    History of Present Illness:  Patrick [name] is a 63 y.o. male who suffered an anterior ST segment elevation myocardial infarction on 08/04/2017.   I saw him for initial office visit in November 2018.  He was last evaluated in a telemedicine visit on October 11, 2019.  He presents for follow-up evaluation.  Mr. Route has a history of hypertension, hyperlipidemia, and diabetes mellitus.  He was admitted on 08/04/2017 with ST segment elevation anterolaterally and a code STEMI was activated.  I performed emergent cardiac catheterization which revealed total occlusion of his LAD after the first diagonal vessel.  There was a long segment of occlusion and ultimately a Resolute on a 3.038 mm stent was inserted, postdilated 3.25 mm with 100% occlusion being reduced to 0% and TIMI 0 flow been improved to TIMI 3 flow.  He developed transient heart block during the procedure which required IV atropine.  Of note, his lipid panel during that evaluation was consistent with an atherogenic dyslipidemic pattern with triglycerides of 370, VLDL 74, low HDL at 29, and his total cholesterol was 204 with LDL 101.  He was started on atorvastatin and also was on fenofibrate.  Subsequent, please fenofibrate was discontinued by his primary physician and he has been taking over-the-counter fish oil.  Repeat blood work 1 month later by his primary physician showed a total cholesterol 124, LDL 47, triglycerides were 251, HDL was 26.5.  Over the past several months, he has done well.  He denies any definitive recurrent anginal symptomatology.  He had experienced 1 vague episode of  similar sensation after working 16 hours a day for 3 days and having to go to another business trip in [place].    When I  saw him he had noticed some occasional episodes of heartburn  sensation, but also has noted this with mild exertion.  He was seen by [name], NP on 02/28/2018.  Because of worsening heartburn symptoms and fatigue, which seemed nitrate responsive she recommended a follow-up nuclear perfusion study for reassessment.  His nuclear study  on 03/10/2018 was interpreted as a high risk study suggested ischemia involving both the inferior as well as anterior wall.  There was diffuse hypokinesis.  EF was 45%.  When I saw him in follow-up of the nuclear study I recommended definitive cardiac catheterization.  Cardiac catheterization was done on March 21, 2018. This revealed preserved global LV contractility with a small region of very mild residual mid anterolateral hypocontractility.  Ejection fraction was 50-55%.  The stent in the mid LAD was widely patent and his circumflex and dominant RCA were normal.  Medical therapy was recommended and optimization of blood pressure.  Due to lipid studies that were significantly elevated in August 2018 I recommended the addition of his Vascepa to his atorvastatin.   I saw him in May 2019 and last saw him in October 2019.  At that time he continued to do well.  At the time, he was on amlodipine 10 mg and metoprolol 25 mg twice a day.  His resting pulse was 96 and I recommended titration of metoprolol tartrate to 37.5 mg twice a day.  Laboratory on September 19, 2018 at the [facility]: Total cholesterol was 101, HDL 33, LDL 50, triglycerides had improved to 88. VLDL was  18. He is enrolled in a study with [name] which is an oral Victoza therapy. Hemoglobin A1c before the study was 8.3 and 1 week ago repeat hemoglobin A1c was 6.8.   I last evaluated him in a telemedicine visit on October 11, 2019.  At that time he had told me that 3 weeks previously he went to the beach and while walking on the beach for 10 to 15 minutes began to notice a substernal chest discomfort.  He was able to walk through it but at a slower pace.   Since that time, he has experienced some recurrent exertional chest tightness particularly if walking fast up hills.  He has not tried nitroglycerin.  He denies any rest discomfort.  He denies any palpitations.  He denies PND orthopnea.  He had recently seen [name].  He tells me during his recent evaluation his blood pressure was 131/71 and his pulse was 68.  Laboratory revealed a total cholesterol 109, triglycerides 218, VLDL 44, HDL 29.9, and LDL 36.    During theevaluation, I recommended slight titration of meds Toprol 50 mg twice a day, continue amlodipine 10 mg daily and initiated isosorbide 30 mg.  I scheduled him to undergo a nuclear perfusion study for reevaluation and further assessment of his chest pain and potential for ischemia.  Mr. Mecham admits to some mild recurrent chest pain episodes.  He had an episode when he was lifting a refrigerator outside and noticed discomfort.  He underwent a nuclear perfusion study on October 19, 2019.  There was a suggestion of a medium defect of mild severity in the basal inferior to mid inferior wall suggestive of possible ischemia.  He did not have any ECG changes during stress and he had normal wall motion and EF at 59% on quantitative analysis.  His chest pain has some atypical features.  He has been taking pantoprazole for GERD.  He presents for evaluation.  Past Medical History:  Diagnosis Date   CAD in native artery    a. anterolateral STEMI 07/2017 s/p DES to prox-mid LAD, EF 55%.   GERD (gastroesophageal reflux disease)    Hyperlipidemia    Hypertension    Overactive bladder    Renal calculi    Uncontrolled diabetes mellitus ([place])     Past Surgical History:  Procedure Laterality Date   CORONARY/GRAFT ACUTE MI REVASCULARIZATION N/A 08/04/2017   Procedure: Coronary/Graft Acute MI Revascularization;  Surgeon: Troy Sine, MD;  Location: [facility];  Service: Cardiovascular;  Laterality: N/A;   LEFT HEART CATH AND  CORONARY ANGIOGRAPHY N/A 08/04/2017   Procedure: LEFT HEART CATH AND CORONARY ANGIOGRAPHY;  Surgeon: Troy Sine, MD;  Location: [facility];  Service: Cardiovascular;  Laterality: N/A;   LEFT HEART CATH AND CORONARY ANGIOGRAPHY N/A 03/21/2018   Procedure: LEFT HEART CATH AND CORONARY ANGIOGRAPHY;  Surgeon: Troy Sine, MD;  Location: [facility];  Service: Cardiovascular;  Laterality: N/A;   PROSTATE SURGERY      Current Medications: Outpatient Medications Prior to Visit  Medication Sig Dispense Refill   amLODipine (NORVASC) 10 MG tablet TAKE 1 TABLET(10 MG) BY MOUTH DAILY 90 tablet 1   aspirin (ASPIRIN LOW DOSE) 81 MG EC tablet Take 1 tablet (81 mg total) by mouth daily. 90 tablet 0   atorvastatin (LIPITOR) 40 MG tablet TAKE 1 TABLET(40 MG) BY MOUTH EVERY EVENING 90 tablet 2   Coenzyme Q10 200 MG capsule Take 200 mg by mouth daily.  glimepiride (AMARYL) 2 MG tablet Take 2 mg by mouth daily with breakfast.     Glucosamine-Chondroit-Vit C-Mn (GLUCOSAMINE 1500 COMPLEX PO) Take 1,500 mg by mouth daily.     losartan (COZAAR) 100 MG tablet Take 1 tablet (100 mg total) by mouth daily. 90 tablet 2   Magnesium 250 MG TABS Take 250 mg by mouth daily.     Magnesium Citrate 100 MG TABS Take 100 mg by mouth daily.     metformin (FORTAMET) 500 MG (OSM) 24 hr tablet Take 2,000 mg by mouth 2 (two) times daily with a meal.      metoprolol tartrate (LOPRESSOR) 50 MG tablet Take 1 tablet (50 mg total) by mouth 2 (two) times daily. 180 tablet 1   mirabegron ER (MYRBETRIQ) 25 MG TB24 tablet Take 1 tablet (25 mg total) by mouth daily. 30 tablet 11   Multiple Vitamins-Minerals (ABC PLUS SENIOR PO) Take 1 tablet by mouth daily.     nitroGLYCERIN (NITROSTAT) 0.4 MG SL tablet Place 1 tablet (0.4 mg total) under the tongue every 5 (five) minutes x 3 doses as needed for chest pain. 25 tablet 3   pantoprazole (PROTONIX) 40 MG tablet Take 40 mg by mouth 2 (two) times daily.      Potassium 99 MG TABS Take 99 mg by mouth daily.     pyridOXINE (VITAMIN B-6) 50 MG tablet Take 50 mg by mouth daily.     tamsulosin (FLOMAX) 0.4 MG CAPS capsule TAKE 1 CAPSULE BY MOUTH DAILY 30 capsule 11   ticagrelor (BRILINTA) 60 MG TABS tablet TAKE 1 TABLET(90 MG) BY MOUTH TWICE DAILY 180 tablet 3   vitamin C (ASCORBIC ACID) 500 MG tablet Take 500 mg by mouth daily.     vitamin E 400 UNIT capsule Take 400 Units by mouth daily.     isosorbide mononitrate (IMDUR) 30 MG 24 hr tablet Take 1 tablet (30 mg total) by mouth daily. 90 tablet 3   No facility-administered medications prior to visit.      Allergies:   Patient has no known allergies.   Social History   Socioeconomic History   Marital status: Married    Spouse name: Not on file   Number of children: Not on file   Years of education: Not on file   Highest education level: Not on file  Occupational History   Not on file  Social Needs   Financial resource strain: Not on file   Food insecurity    Worry: Not on file    Inability: Not on file   Transportation needs    Medical: Not on file    Non-medical: Not on file  Tobacco Use   Smoking status: Never Smoker   Smokeless tobacco: Never Used  Substance and Sexual Activity   Alcohol use: No   Drug use: No   Sexual activity: Not on file  Lifestyle   Physical activity    Days per week: Not on file    Minutes per session: Not on file   Stress: Not on file  Relationships   Social connections    Talks on phone: Not on file    Gets together: Not on file    Attends religious service: Not on file    Active member of club or organization: Not on file    Attends meetings of clubs or organizations: Not on file    Relationship status: Not on file  Other Topics Concern   Not on file  Social History Narrative  Not on file     Family History:  The patient's family history includes Heart attack in his father; Heart disease in his father.    ROS General: Negative; No fevers, chills, or night sweats;  HEENT: Negative; No changes in vision or hearing, sinus congestion, difficulty swallowing Pulmonary: Positive for recent bronchitis Cardiovascular:   See history of present illness GI: Negative; No nausea, vomiting, diarrhea, or abdominal pain GU: Negative; No dysuria, hematuria, or difficulty voiding Musculoskeletal: Negative; no myalgias, joint pain, or weakness Hematologic/Oncology: Negative; no easy bruising, bleeding Endocrine: Positive for diabetes Neuro: Negative; no changes in balance, headaches Skin: Negative; No rashes or skin lesions Psychiatric: Negative; No behavioral problems, depression Sleep: Negative; No awareness of snoring, daytime sleepiness, hypersomnolence, bruxism, restless legs, hypnogognic hallucinations, no cataplexy Other comprehensive 14 point system review is negative.   PHYSICAL EXAM:   BP 125/74    Pulse 72    Temp (!) 97.2 F (36.2 C)    Ht 6' (1.829 m)    Wt 236 lb 3.2 oz (107.1 kg)    BMI 32.03 kg/m    Repeat blood pressure by me was 130/75  Wt Readings from Last 3 Encounters:  11/07/19 236 lb 3.2 oz (107.1 kg)  10/19/19 237 lb (107.5 kg)  10/11/19 237 lb (107.5 kg)    General: Alert, oriented, no distress.  Skin: normal turgor, no rashes, warm and dry HEENT: Normocephalic, atraumatic. Pupils equal round and reactive to light; sclera anicteric; extraocular muscles intact;  Nose without nasal septal hypertrophy Mouth/Parynx benign; Mallinpatti scale 3 Neck: No JVD, no carotid bruits; normal carotid upstroke Lungs: clear to ausculatation and percussion; no wheezing or rales Chest wall: without tenderness to palpitation Heart: PMI not displaced, RRR, s1 s2 normal, 1/6 systolic murmur, no diastolic murmur, no rubs, gallops, thrills, or heaves Abdomen: Mild central adiposity; soft, nontender; no hepatosplenomehaly, BS+; abdominal aorta nontender and not dilated by palpation. Back: no  CVA tenderness Pulses 2+ Musculoskeletal: full range of motion, normal strength, no joint deformities Extremities: no clubbing cyanosis or edema, Homan's sign negative  Neurologic: grossly nonfocal; Cranial nerves grossly wnl Psychologic: Normal mood and affect   Studies/Labs Reviewed:   EKG:  EKG is ordered today.  Normal sinus rhythm at 67 bpm.  Right bundle branch block with repolarization changes  October 17 2018 ECG (independently read by me): Normal sinus rhythm at 93 bpm.  Right bundle branch block with repolarization changes.  QTc interval 44 ms.  Apr 25, 2018 ECG (independently read by me): Normal sinus rhythm at 73 bpm.  Right bundle branch block with repolarization changes.  March 16, 2018 ECG (independently read by me): NSR at 63;RBBB with repolarization changes QTc 431 msec  November 2018 ECG (independently read by me): Normal sinus rhythm at 66 bpm.  Right bundle branch block with repolarization changes.  QTc interval 448 ms.  Recent Labs: BMP Latest Ref Rng & Units 03/16/2018 08/05/2017 08/04/2017  Glucose 65 - 99 mg/dL 114(H) 174(H) 367(H)  BUN 8 - 27 mg/dL 23 17 28(H)  Creatinine 0.76 - 1.27 mg/dL 1.09 1.16 1.20  BUN/Creat Ratio 10 - 24 21 - -  Sodium 134 - 144 mmol/L 145(H) 138 136  Potassium 3.5 - 5.2 mmol/L 4.5 3.4(L) 4.7  Chloride 96 - 106 mmol/L 107(H) 105 98(L)  CO2 20 - 29 mmol/L 17(L) 24 -  Calcium 8.6 - 10.2 mg/dL 9.6 8.6(L) -     Hepatic Function Latest Ref Rng & Units 08/04/2017 11/08/2016  Total Protein  6.5 - 8.1 g/dL 6.6 6.6  Albumin 3.5 - 5.0 g/dL 4.1 4.2  AST 15 - 41 U/L 36 31  ALT 17 - 63 U/L 55 30  Alk Phosphatase 38 - 126 U/L 35(L) 27(L)  Total Bilirubin 0.3 - 1.2 mg/dL 0.7 0.5  Bilirubin, Direct 0.1 - 0.5 mg/dL - 0.2    CBC Latest Ref Rng & Units 03/16/2018 08/05/2017 08/04/2017  WBC 3.4 - 10.8 x10E3/uL 6.5 9.5 -  Hemoglobin 13.0 - 17.7 g/dL 15.4 13.5 12.6(L)  Hematocrit 37.5 - 51.0 % 44.0 37.7(L) 37.0(L)  Platelets 150 - 379 x10E3/uL 208  218 -   Lab Results  Component Value Date   MCV 87 03/16/2018   MCV 83.8 08/05/2017   MCV 83.6 08/04/2017   No results found for: TSH Lab Results  Component Value Date   HGBA1C 9.8 (H) 08/05/2017     BNP No results found for: BNP  ProBNP No results found for: PROBNP   Lipid Panel     Component Value Date/Time   CHOL 204 (H) 08/04/2017 1005   TRIG 370 (H) 08/04/2017 1005   HDL 29 (L) 08/04/2017 1005   CHOLHDL 7.0 08/04/2017 1005   VLDL 74 (H) 08/04/2017 1005   LDLCALC 101 (H) 08/04/2017 1005     RADIOLOGY: No results found.   Additional studies/ records that were reviewed today include:   Emergent cardiac catheterization/PCI 08/04/2017: Conclusion     LV end diastolic pressure is mildly elevated.  The left ventricular systolic function is normal.  A STENT RESOLUTE EQAS3.4H96 drug eluting stent was successfully placed.  Prox LAD to Mid LAD lesion, 100 %stenosed.  Post intervention, there is a 0% residual stenosis.   Acute ST segment elevation myocardial infarction secondary to total occlusion of the LAD immediately after the takeoff of the first diagonal and septal perforating artery.  There was TIMI 0 flow.  Mild luminal irregularity of the left circumflex vessel without significant obstructive stenoses.  Dominant normal RCA.  Successful PCI to the totally occluded LAD which resulted in a long segment of occlusion and ultimately treated with PTCA and DES stenting with a Resolute onyx 3.038 mm stent postdilated to 3.25 mm with 100% occlusion being reduced to 0% and TIMI 0 flow being improved to TIMI 3 flow.  Transient heart block treated with IV atropine.  Preserved global LV contractility with subtle apical inferior focal hypocontractility.  The patient presented to [facility] emergency room at ~ 8:45 AM.  He presented to [facility] catheterization lab at approximately 10:45 AM.  During balloon time from presentation to [facility] catheterization laboratory  was ~ 20 minutes.  RECOMMENDATION: The patient will continue with dual antiplatelet therapy for minimum of a year.  He will be started on high potency statin therapy, low-dose beta blocker therapy, with ultimate ACE inhibition.  Metformin will be held for 48 hours postcontrast.    03/10/2018 Nuclear Study Highlights     The left ventricular ejection fraction is mildly decreased (45-54%).  Nuclear stress EF: 45%.  There was no ST segment deviation noted during stress.  Findings consistent with ischemia.  This is a high risk study.   TID 1.3 Normalization artifact likely but appears to be both inferior and anterior ischemia Diffuse hypokinesis EF 45%     March 21, 2018 cardiac catheterization conclusion     Previously placed Prox LAD to Mid LAD stent (unknown type) is widely patent.  The left ventricular systolic function is normal.  LV end diastolic pressure is  normal.  The left ventricular ejection fraction is 50-55% by visual estimate.  There is no mitral valve regurgitation.   Preserved global LV contractility with a small region of very mild residual mid anterolateral hypocontractility.  Ejection fraction 50-55%.  No significant coronary obstructive disease with evidence for widely patent mid LAD stent after the takeoff of a first diagonal vessel, normal left circumflex and normal dominant RCA.  RECOMMENDATION: Medical therapy.  Continue DAPT in this patient status post anterior ST segment MI August 2018.  Optimize blood pressure.  Continue high potency statin therapy.     NUCLEAR STUDY 10/19/2019  The left ventricular ejection fraction is normal (55-65%).  Nuclear stress EF: 59%.  There was no ST segment deviation noted during stress.  Defect 1: There is a medium defect of mild severity present in the basal inferior and mid inferior location.  Findings consistent with ischemia.  This is a low risk study.   Abnormal, low risk stress nuclear  study with mild inferior ischemia.  Gated ejection fraction 59% with normal wall motion.    ASSESSMENT:    1. Coronary artery disease involving native coronary artery of native heart with other form of angina pectoris ([place])   2. Essential hypertension   3. Chronic GERD   4. Type 2 diabetes mellitus with complication, without long-term current use of insulin ([place])   5. Hyperlipidemia LDL goal <70   6. Mild obesity   7. RBBB      PLAN:  Mr. Quinton Voth is a 63 year old gentleman who has a history of hypertension, hyperlipidemia, and diabetes mellitus. On 08/04/2017 he presented with an acute anterior wall ST segment elevation myocardial infarctions and was found to have total occlusion of his LAD after the first diagonal vessel.  He was found to have a long occluded segment which was successfully treated with DES stenting with a 3.038 mm Resolute DES stent.   When I saw for initial posthospital evaluation his BMI was mildly increased at 31.36.  Weight reduction was recommended.  He also has mild central adiposity and discussed with him that this type of fat typically is associated with greater levels of inflammation. His blood pressure was elevated and I titrated his medications. A nuclear perfusion study in March 2019 suggested the possibility of ischemia involving both the anterior and inferior wall.  EF was 45% and there was diffuse hypokinesis.  With his blood pressure elevation, and nuclear study suggestive of ischemia, I added amlodipine 5 mg to his regimen and further titrated metoprolol to 37.5 mg twice a day.  He underwent definitive repeat cardiac catheterization on 03/21/2018 which revealed a widely patent mid LAD stent and a normal left circumflex and dominant RCA.  When I last saw him, his blood pressure was elevated and I further titrated amlodipine to 10 mg daily and also recommended he increase metoprolol to 50 mg twice a day but at follow-up he was only taking this 25 mg twice a day.    Laboratory from September 19, 2018 demonstrated significantly improved lipid studies and triglycerides were down to 88.  Apparently he never had of a CPAP filled due to insurance delay but he has been taking a triple strength omega-3 fatty acid that has 900 mg of EPA and 600 mg of DHA.  Since he was over a year following his ACS, I subsequently  reduced Brilinta to 60 mg twice a day from his present dose of 90 twice daily per Pegasys trial data.  He again has  noticed some recurrent episodes of chest pain.  He particularly notices when he was lifting a heavy refrigerator.  At times the chest pain is atypical.  I reviewed his most recent nuclear perfusion study from October 2020 in detail.  The study suggests possible ischemia in the mid to basal inferior wall and compared to his prior nuclear study ejection fraction is now normal and he has normal wall motion.  Presently I am increasing isosorbide to 60 mg daily.  I also suggested a trial of increasing Protonix to twice a day for the next several weeks to get it see if he notes improvement in some of his symptoms.  If he continues to experience increasing chest pain symptomatology definitive repeat catheterization may be necessary.  He will continue taking amlodipine 10 mg isosorbide now at 60 mg, losartan 100 mg, and metoprolol 50 mg twice a day for anti-ischemic benefit.  He is diabetic on Metformin and glipizide and is participating in a study with [name] where he is receiving oral Victoza rather than currently marketable injection version.  BMI continues to be increased at 32.03 suggestive of mild obesity.  Weight loss was recommended.  I will see him in 4 to 6 weeks for reevaluation or sooner if he develops increasing symptomatology.   tedication Adjustments/Labs and Tests Ordered: Current medicines are reviewed at length with the patient today.  Concerns regarding medicines are outlined above.  Medication changes, Labs and Tests ordered today are  listed in the Patient Instructions below. Patient Instructions  Medication Instructions:  INCREASE ISOSORBIDE 60MG DAILY  INCREASE PRIOONIX TWICE DAILY  If you need a refill on your cardiac medications before your next appointment, please call your pharmacy.  Special Instructions: INCREASE WALKING DAILY  Follow-Up: IN 4-6 WEEKS In Person Shelva Majestic, MD.    At [facility], you and your health needs are our priority.  As part of our continuing mission to provide you with exceptional heart care, we have created designated Provider Care Teams.  These Care Teams include your primary Cardiologist (physician) and Advanced Practice Providers (APPs -  Physician Assistants and Nurse Practitioners) who all work together to provide you with the care you need, when you need it.  Thank you for choosing [facility] at [facility]!!          Signed, Shelva Majestic, MD  11/13/2019 5:54 PM    [place] [address] [place], [place], [place]  [address] Phone: [phone]

## 2019-11-13 ENCOUNTER — Encounter: Payer: Self-pay | Admitting: Cardiovascular Disease

## 2019-11-19 ENCOUNTER — Other Ambulatory Visit: Payer: Self-pay | Admitting: Cardiovascular Disease

## 2020-01-02 ENCOUNTER — Encounter: Payer: Self-pay | Admitting: Cardiovascular Disease

## 2020-01-02 ENCOUNTER — Other Ambulatory Visit: Payer: Self-pay

## 2020-01-02 ENCOUNTER — Ambulatory Visit (INDEPENDENT_AMBULATORY_CARE_PROVIDER_SITE_OTHER): Payer: Medicare Other | Admitting: Cardiovascular Disease

## 2020-01-02 VITALS — BP 118/70 | HR 61 | Temp 97.4°F | Ht 72.0 in | Wt 235.0 lb

## 2020-01-02 DIAGNOSIS — I25118 Atherosclerotic heart disease of native coronary artery with other forms of angina pectoris: Secondary | ICD-10-CM

## 2020-01-02 DIAGNOSIS — E118 Type 2 diabetes mellitus with unspecified complications: Secondary | ICD-10-CM

## 2020-01-02 DIAGNOSIS — I10 Essential (primary) hypertension: Secondary | ICD-10-CM

## 2020-01-02 DIAGNOSIS — E669 Obesity, unspecified: Secondary | ICD-10-CM | POA: Diagnosis not present

## 2020-01-02 DIAGNOSIS — K219 Gastro-esophageal reflux disease without esophagitis: Secondary | ICD-10-CM

## 2020-01-02 DIAGNOSIS — E785 Hyperlipidemia, unspecified: Secondary | ICD-10-CM

## 2020-01-02 DIAGNOSIS — I4510 Unspecified right bundle-branch block: Secondary | ICD-10-CM

## 2020-01-02 NOTE — Progress Notes (Signed)
Cardiology Office Note    Date:  01/04/2020   ID:  Patrick [name], DOB [birth year], MRN [id number]  PCP:  Kirk Ruths, MD  Cardiologist:  Shelva Majestic, MD    History of Present Illness:  Patrick [name] is a 64 y.o. male who suffered an anterior ST segment elevation myocardial infarction on 08/04/2017.   I saw him for initial office visit in November 2018.  I last saw him in November 2020.  He presents for follow-up evaluation.  [name] has a history of hypertension, hyperlipidemia, and diabetes mellitus.  He was admitted on 08/04/2017 with ST segment elevation anterolaterally and a code STEMI was activated.  I performed emergent cardiac catheterization which revealed total occlusion of his LAD after the first diagonal vessel.  There was a long segment of occlusion and ultimately a Resolute on a 3.038 mm stent was inserted, postdilated 3.25 mm with 100% occlusion being reduced to 0% and TIMI 0 flow been improved to TIMI 3 flow.  He developed transient heart block during the procedure which required IV atropine.  Of note, his lipid panel during that evaluation was consistent with an atherogenic dyslipidemic pattern with triglycerides of 370, VLDL 74, low HDL at 29, and his total cholesterol was 204 with LDL 101.  He was started on atorvastatin and also was on fenofibrate.  Subsequent, please fenofibrate was discontinued by his primary physician and he has been taking over-the-counter fish oil.  Repeat blood work 1 month later by his primary physician showed a total cholesterol 124, LDL 47, triglycerides were 251, HDL was 26.5.  Over the past several months, he has done well.  He denies any definitive recurrent anginal symptomatology.  He had experienced 1 vague episode of  similar sensation after working 16 hours a day for 3 days and having to go to another business trip in [place].    When I  saw him he had noticed some occasional episodes of heartburn sensation, but also has noted this with  mild exertion.  He was seen by [name], NP on 02/28/2018.  Because of worsening heartburn symptoms and fatigue, which seemed nitrate responsive she recommended a follow-up nuclear perfusion study for reassessment.  His nuclear study  on 03/10/2018 was interpreted as a high risk study suggested ischemia involving both the inferior as well as anterior wall.  There was diffuse hypokinesis.  EF was 45%.  When I saw him in follow-up of the nuclear study I recommended definitive cardiac catheterization.  Cardiac catheterization was done on March 21, 2018. This revealed preserved global LV contractility with a small region of very mild residual mid anterolateral hypocontractility.  Ejection fraction was 50-55%.  The stent in the mid LAD was widely patent and his circumflex and dominant RCA were normal.  Medical therapy was recommended and optimization of blood pressure.  Due to lipid studies that were significantly elevated in August 2018 I recommended the addition of his Vascepa to his atorvastatin.   I saw him in May 2019 and last saw him in October 2019.  At that time he continued to do well.  At the time, he was on amlodipine 10 mg and metoprolol 25 mg twice a day.  His resting pulse was 96 and I recommended titration of metoprolol tartrate to 37.5 mg twice a day.  Laboratory on September 19, 2018 at the [facility]: Total cholesterol was 101, HDL 33, LDL 50, triglycerides had improved to 88. VLDL was 18. He is enrolled in  a study with [name] which is an oral Victoza therapy. Hemoglobin A1c before the study was 8.3 and 1 week ago repeat hemoglobin A1c was 6.8.   I last evaluated him in a telemedicine visit on October 11, 2019.  At that time he had told me that 3 weeks previously he went to the beach and while walking on the beach for 10 to 15 minutes began to notice a substernal chest discomfort.  He was able to walk through it but at a slower pace.  Since that time, he has experienced some  recurrent exertional chest tightness particularly if walking fast up hills.  He has not tried nitroglycerin.  He denies any rest discomfort.  He denies any palpitations.  He denies PND orthopnea.  He had recently seen [name].  He tells me during his recent evaluation his blood pressure was 131/71 and his pulse was 68.  Laboratory revealed a total cholesterol 109, triglycerides 218, VLDL 44, HDL 29.9, and LDL 36.    During theevaluation, I recommended slight titration of meds Toprol 50 mg twice a day, continue amlodipine 10 mg daily and initiated isosorbide 30 mg.  I scheduled him to undergo a nuclear perfusion study for reevaluation and further assessment of his chest pain and potential for ischemia.  When I saw him in November 2020 he admitted to some mild recurrent chest pain episodes.  He had an episode when he was lifting a refrigerator outside and noticed discomfort.  He underwent a nuclear perfusion study on October 19, 2019.  There was a suggestion of a medium defect of mild severity in the basal inferior to mid inferior wall suggestive of possible ischemia.  He did not have any ECG changes during stress and he had normal wall motion and EF at 59% on quantitative analysis.  His chest pain has some atypical features.  He wasa been taking pantoprazole for GERD.  During that evaluation, I recommended increasing isosorbide to 60 mg daily and also suggested a trial of increasing Protonix to twice a day for the next several weeks to see if there was any improvement.  Over the past 2 months, he denies any major change in symptomatology.  Oftentimes he may noticed the development of some mild chest discomfort approximately 15 minutes into a walk.  He typically does not walk much longer than that so was uncertain if he was able to walk through the pain.  He denies any severe chest pain or significant dyspnea.  There was one occurrence where he had noticed a vague chest sensation at night.  He has not been  successful with weight loss.  He presents for evaluation.  Past Medical History:  Diagnosis Date  . CAD in native artery    a. anterolateral STEMI 07/2017 s/p DES to prox-mid LAD, EF 55%.  Marland Kitchen GERD (gastroesophageal reflux disease)   . Hyperlipidemia   . Hypertension   . Overactive bladder   . Renal calculi   . Uncontrolled diabetes mellitus ([place])     Past Surgical History:  Procedure Laterality Date  . CORONARY/GRAFT ACUTE MI REVASCULARIZATION N/A 08/04/2017   Procedure: Coronary/Graft Acute MI Revascularization;  Surgeon: Troy Sine, MD;  Location: [facility];  Service: Cardiovascular;  Laterality: N/A;  . LEFT HEART CATH AND CORONARY ANGIOGRAPHY N/A 08/04/2017   Procedure: LEFT HEART CATH AND CORONARY ANGIOGRAPHY;  Surgeon: Troy Sine, MD;  Location: [facility];  Service: Cardiovascular;  Laterality: N/A;  . LEFT HEART CATH  AND CORONARY ANGIOGRAPHY N/A 03/21/2018   Procedure: LEFT HEART CATH AND CORONARY ANGIOGRAPHY;  Surgeon: Troy Sine, MD;  Location: [facility];  Service: Cardiovascular;  Laterality: N/A;  . PROSTATE SURGERY      Current Medications: Outpatient Medications Prior to Visit  Medication Sig Dispense Refill  . amLODipine (NORVASC) 10 MG tablet TAKE 1 TABLET(10 MG) BY MOUTH DAILY 90 tablet 1  . aspirin (ASPIRIN LOW DOSE) 81 MG EC tablet Take 1 tablet (81 mg total) by mouth daily. 90 tablet 0  . atorvastatin (LIPITOR) 40 MG tablet TAKE 1 TABLET(40 MG) BY MOUTH EVERY EVENING 90 tablet 2  . BRILINTA 60 MG TABS tablet TAKE 1 TABLET BY MOUTH TWICE DAILY 180 tablet 3  . Coenzyme Q10 200 MG capsule Take 200 mg by mouth daily.    Marland Kitchen glimepiride (AMARYL) 2 MG tablet Take 2 mg by mouth daily with breakfast.    . Glucosamine-Chondroit-Vit C-Mn (GLUCOSAMINE 1500 COMPLEX PO) Take 1,500 mg by mouth daily.    . isosorbide mononitrate (IMDUR) 60 MG 24 hr tablet Take 1 tablet (60 mg total) by mouth daily. 90 tablet 3  . losartan (COZAAR) 100 MG  tablet Take 1 tablet (100 mg total) by mouth daily. 90 tablet 2  . Magnesium 250 MG TABS Take 250 mg by mouth daily.    . Magnesium Citrate 100 MG TABS Take 100 mg by mouth daily.    . metformin (FORTAMET) 500 MG (OSM) 24 hr tablet Take 2,000 mg by mouth 2 (two) times daily with a meal.     . metoprolol tartrate (LOPRESSOR) 50 MG tablet Take 1 tablet (50 mg total) by mouth 2 (two) times daily. 180 tablet 1  . mirabegron ER (MYRBETRIQ) 25 MG TB24 tablet Take 1 tablet (25 mg total) by mouth daily. 30 tablet 11  . Multiple Vitamins-Minerals (ABC PLUS SENIOR PO) Take 1 tablet by mouth daily.    . nitroGLYCERIN (NITROSTAT) 0.4 MG SL tablet Place 1 tablet (0.4 mg total) under the tongue every 5 (five) minutes x 3 doses as needed for chest pain. 25 tablet 3  . Omega-3 Fatty Acids (FISH OIL OMEGA-3 PO) Take 2 capsules by mouth 2 (two) times daily.     . pantoprazole (PROTONIX) 40 MG tablet Take 40 mg by mouth 2 (two) times daily.    . Potassium 99 MG TABS Take 99 mg by mouth daily.    Marland Kitchen pyridOXINE (VITAMIN B-6) 50 MG tablet Take 50 mg by mouth daily.    . tamsulosin (FLOMAX) 0.4 MG CAPS capsule TAKE 1 CAPSULE BY MOUTH DAILY 30 capsule 11  . vitamin C (ASCORBIC ACID) 500 MG tablet Take 500 mg by mouth daily.    . vitamin E 400 UNIT capsule Take 400 Units by mouth daily.     No facility-administered medications prior to visit.     Allergies:   Patient has no known allergies.   Social History   Socioeconomic History  . Marital status: Married    Spouse name: Not on file  . Number of children: Not on file  . Years of education: Not on file  . Highest education level: Not on file  Occupational History  . Not on file  Tobacco Use  . Smoking status: Never Smoker  . Smokeless tobacco: Never Used  Substance and Sexual Activity  . Alcohol use: No  . Drug use: No  . Sexual activity: Not on file  Other Topics Concern  . Not on file  Social History Narrative  . Not on file   Social Determinants  of Health   Financial Resource Strain:   . Difficulty of Paying Living Expenses: Not on file  Food Insecurity:   . Worried About Charity fundraiser in the Last Year: Not on file  . Ran Out of Food in the Last Year: Not on file  Transportation Needs:   . Lack of Transportation (Medical): Not on file  . Lack of Transportation (Non-Medical): Not on file  Physical Activity:   . Days of Exercise per Week: Not on file  . Minutes of Exercise per Session: Not on file  Stress:   . Feeling of Stress : Not on file  Social Connections:   . Frequency of Communication with Friends and Family: Not on file  . Frequency of Social Gatherings with Friends and Family: Not on file  . Attends Religious Services: Not on file  . Active Member of Clubs or Organizations: Not on file  . Attends Archivist Meetings: Not on file  . Marital Status: Not on file     Family History:  The patient's family history includes Heart attack in his father; Heart disease in his father.   ROS General: Negative; No fevers, chills, or night sweats;  HEENT: Negative; No changes in vision or hearing, sinus congestion, difficulty swallowing Pulmonary: Positive for bronchitis Cardiovascular:   See history of present illness GI: Negative; No nausea, vomiting, diarrhea, or abdominal pain GU: Negative; No dysuria, hematuria, or difficulty voiding Musculoskeletal: Negative; no myalgias, joint pain, or weakness Hematologic/Oncology: Negative; no easy bruising, bleeding Endocrine: Positive for diabetes Neuro: Negative; no changes in balance, headaches Skin: Negative; No rashes or skin lesions Psychiatric: Negative; No behavioral problems, depression Sleep: Negative; No awareness of snoring, daytime sleepiness, hypersomnolence, bruxism, restless legs, hypnogognic hallucinations, no cataplexy Other comprehensive 14 point system review is negative.   PHYSICAL EXAM:   BP 118/70 (BP Location: Left Arm, Patient Position:  Sitting, Cuff Size: Normal)   Pulse 61   Temp (!) 97.4 F (36.3 C)   Ht 6' (1.829 m)   Wt 235 lb (106.6 kg)   BMI 31.87 kg/m    Repeat blood pressure by me was 120/78  Wt Readings from Last 3 Encounters:  01/02/20 235 lb (106.6 kg)  11/07/19 236 lb 3.2 oz (107.1 kg)  10/19/19 237 lb (107.5 kg)    General: Alert, oriented, no distress.  Skin: normal turgor, no rashes, warm and dry HEENT: Normocephalic, atraumatic. Pupils equal round and reactive to light; sclera anicteric; extraocular muscles intact;  Nose without nasal septal hypertrophy Mouth/Parynx benign; Mallinpatti scale 3 Neck: No JVD, no carotid bruits; normal carotid upstroke Lungs: clear to ausculatation and percussion; no wheezing or rales Chest wall: without tenderness to palpitation Heart: PMI not displaced, RRR, s1 s2 normal, 1/6 systolic murmur, no diastolic murmur, no rubs, gallops, thrills, or heaves Abdomen: soft, nontender; no hepatosplenomehaly, BS+; abdominal aorta nontender and not dilated by palpation. Back: no CVA tenderness Pulses 2+ Musculoskeletal: full range of motion, normal strength, no joint deformities Extremities: no clubbing cyanosis or edema, Homan's sign negative  Neurologic: grossly nonfocal; Cranial nerves grossly wnl Psychologic: Normal mood and affect   Studies/Labs Reviewed:   ECG (independently read by me): Normal sinus rhythm at 61 bpm.  Right bundle branch block with repolarization changes.  Normal intervals.  No ectopy.  November 07, 2019 EKG:  EKG is ordered today.  Normal sinus rhythm at 67 bpm.  Right bundle  branch block with repolarization changes  October 17 2018 ECG (independently read by me): Normal sinus rhythm at 93 bpm.  Right bundle branch block with repolarization changes.  QTc interval 44 ms.  Apr 25, 2018 ECG (independently read by me): Normal sinus rhythm at 73 bpm.  Right bundle branch block with repolarization changes.  March 16, 2018 ECG (independently read by  me): NSR at 63;RBBB with repolarization changes QTc 431 msec  November 2018 ECG (independently read by me): Normal sinus rhythm at 66 bpm.  Right bundle branch block with repolarization changes.  QTc interval 448 ms.  Recent Labs: BMP Latest Ref Rng & Units 03/16/2018 08/05/2017 08/04/2017  Glucose 65 - 99 mg/dL 114(H) 174(H) 367(H)  BUN 8 - 27 mg/dL 23 17 28(H)  Creatinine 0.76 - 1.27 mg/dL 1.09 1.16 1.20  BUN/Creat Ratio 10 - 24 21 - -  Sodium 134 - 144 mmol/L 145(H) 138 136  Potassium 3.5 - 5.2 mmol/L 4.5 3.4(L) 4.7  Chloride 96 - 106 mmol/L 107(H) 105 98(L)  CO2 20 - 29 mmol/L 17(L) 24 -  Calcium 8.6 - 10.2 mg/dL 9.6 8.6(L) -     Hepatic Function Latest Ref Rng & Units 08/04/2017 11/08/2016  Total Protein 6.5 - 8.1 g/dL 6.6 6.6  Albumin 3.5 - 5.0 g/dL 4.1 4.2  AST 15 - 41 U/L 36 31  ALT 17 - 63 U/L 55 30  Alk Phosphatase 38 - 126 U/L 35(L) 27(L)  Total Bilirubin 0.3 - 1.2 mg/dL 0.7 0.5  Bilirubin, Direct 0.1 - 0.5 mg/dL - 0.2    CBC Latest Ref Rng & Units 03/16/2018 08/05/2017 08/04/2017  WBC 3.4 - 10.8 x10E3/uL 6.5 9.5 -  Hemoglobin 13.0 - 17.7 g/dL 15.4 13.5 12.6(L)  Hematocrit 37.5 - 51.0 % 44.0 37.7(L) 37.0(L)  Platelets 150 - 379 x10E3/uL 208 218 -   Lab Results  Component Value Date   MCV 87 03/16/2018   MCV 83.8 08/05/2017   MCV 83.6 08/04/2017   No results found for: TSH Lab Results  Component Value Date   HGBA1C 9.8 (H) 08/05/2017     BNP No results found for: BNP  ProBNP No results found for: PROBNP   Lipid Panel     Component Value Date/Time   CHOL 204 (H) 08/04/2017 1005   TRIG 370 (H) 08/04/2017 1005   HDL 29 (L) 08/04/2017 1005   CHOLHDL 7.0 08/04/2017 1005   VLDL 74 (H) 08/04/2017 1005   LDLCALC 101 (H) 08/04/2017 1005     RADIOLOGY: No results found.   Additional studies/ records that were reviewed today include:   Emergent cardiac catheterization/PCI 08/04/2017: Conclusion     LV end diastolic pressure is mildly  elevated.  The left ventricular systolic function is normal.  A STENT RESOLUTE [id number] drug eluting stent was successfully placed.  Prox LAD to Mid LAD lesion, 100 %stenosed.  Post intervention, there is a 0% residual stenosis.   Acute ST segment elevation myocardial infarction secondary to total occlusion of the LAD immediately after the takeoff of the first diagonal and septal perforating artery.  There was TIMI 0 flow.  Mild luminal irregularity of the left circumflex vessel without significant obstructive stenoses.  Dominant normal RCA.  Successful PCI to the totally occluded LAD which resulted in a long segment of occlusion and ultimately treated with PTCA and DES stenting with a Resolute onyx 3.038 mm stent postdilated to 3.25 mm with 100% occlusion being reduced to 0% and TIMI 0 flow being improved to  TIMI 3 flow.  Transient heart block treated with IV atropine.  Preserved global LV contractility with subtle apical inferior focal hypocontractility.  The patient presented to [facility] emergency room at ~ 8:45 AM.  He presented to [facility] catheterization lab at approximately 10:45 AM.  During balloon time from presentation to [facility] catheterization laboratory was ~ 20 minutes.  RECOMMENDATION: The patient will continue with dual antiplatelet therapy for minimum of a year.  He will be started on high potency statin therapy, low-dose beta blocker therapy, with ultimate ACE inhibition.  Metformin will be held for 48 hours postcontrast.    03/10/2018 Nuclear Study Highlights     The left ventricular ejection fraction is mildly decreased (45-54%).  Nuclear stress EF: 45%.  There was no ST segment deviation noted during stress.  Findings consistent with ischemia.  This is a high risk study.   TID 1.3 Normalization artifact likely but appears to be both inferior and anterior ischemia Diffuse hypokinesis EF 45%     March 21, 2018 cardiac catheterization conclusion      Previously placed Prox LAD to Mid LAD stent (unknown type) is widely patent.  The left ventricular systolic function is normal.  LV end diastolic pressure is normal.  The left ventricular ejection fraction is 50-55% by visual estimate.  There is no mitral valve regurgitation.   Preserved global LV contractility with a small region of very mild residual mid anterolateral hypocontractility.  Ejection fraction 50-55%.  No significant coronary obstructive disease with evidence for widely patent mid LAD stent after the takeoff of a first diagonal vessel, normal left circumflex and normal dominant RCA.  RECOMMENDATION: Medical therapy.  Continue DAPT in this patient status post anterior ST segment MI August 2018.  Optimize blood pressure.  Continue high potency statin therapy.     NUCLEAR STUDY 10/19/2019  The left ventricular ejection fraction is normal (55-65%).  Nuclear stress EF: 59%.  There was no ST segment deviation noted during stress.  Defect 1: There is a medium defect of mild severity present in the basal inferior and mid inferior location.  Findings consistent with ischemia.  This is a low risk study.   Abnormal, low risk stress nuclear study with mild inferior ischemia.  Gated ejection fraction 59% with normal wall motion.   ASSESSMENT:    1. Coronary artery disease involving native coronary artery of native heart with other form of angina pectoris ([place])   2. Essential hypertension   3. Chronic GERD   4. Mild obesity   5. Hyperlipidemia LDL goal <70   6. Type 2 diabetes mellitus with complication, without long-term current use of insulin (HCC)   7. RBBB      PLAN:  Mr. Raza Bayless is a 64 year old gentleman who has a history of hypertension, hyperlipidemia, and diabetes mellitus. On 08/04/2017 he presented with an acute anterior wall ST segment elevation myocardial infarctions and was found to have total occlusion of his LAD after the first diagonal  vessel.  He was found to have a long occluded segment which was successfully treated with DES stenting with a 3.038 mm Resolute DES stent.  A nuclear perfusion study in March 2019 suggested the possibility of ischemia involving both the anterior and inferior wall.  EF was 45% and there was diffuse hypokinesis.  With his blood pressure elevation, and nuclear study suggestive of ischemia, I added amlodipine 5 mg to his regimen and further titrated metoprolol to 37.5 mg twice a day.  He underwent definitive repeat  cardiac catheterization on 03/21/2018 which revealed a widely patent mid LAD stent and a normal left circumflex and dominant RCA.  He has recently again experienced recurrent chest pain symptomatology with plus minus atypical features.  He seems to notice this within 15 minutes of walking.  Typically goes away on its own.  His most recent nuclear perfusion study was low risk and raised concern for possible mild inferior ischemia.  I suspect this most likely is diaphragmatic attenuation.  At his last catheterization his RCA and circumflex vessels were entirely normal.  He had normal perfusion in the LAD territory and at last cath his LAD stent was widely patent.  He has not noticed any significant benefit with the transient increase of his Protonix or his isosorbide.  His blood pressure today is controlled on his regimen now consisting of amlodipine 10 mg, isosorbide 60 mg, losartan 100 mg in addition to metoprolol tartrate 50 mg twice a day.  He has chronic right bundle branch block which is stable.  He has continued to take dual antiplatelet therapy with aspirin and low-dose Brilinta 60 mg twice a day.  We discussed the importance of weight loss.  Discuss the possibility of esophageal spasm contributing to his discomfort.  We also discussed the possibility of microvascular angina and the possibility of adding ranolazine if necessary.  However, if he continues to experience recurrent symptomatology, definitive  repeat catheterization weight may be necessary.  We discussed definitive repeat cardiac catheterization if symptoms persist.  He continues to be on atorvastatin 40 mg and omega-3 fatty acid for lipid management.  I will see him in several months for follow-up evaluation or sooner if needed.   tedication Adjustments/Labs and Tests Ordered: Current medicines are reviewed at length with the patient today.  Concerns regarding medicines are outlined above.  Medication changes, Labs and Tests ordered today are listed in the Patient Instructions below. Patient Instructions  Medication Instructions:  Your physician recommends that you continue on your current medications as directed. Please refer to the Current Medication list given to you today.  If you need a refill on your cardiac medications before your next appointment, please call your pharmacy.   Lab work: NONE  Testing/Procedures: NONE  Follow-Up: At [facility], you and your health needs are our priority.  As part of our continuing mission to provide you with exceptional heart care, we have created designated Provider Care Teams.  These Care Teams include your primary Cardiologist (physician) and Advanced Practice Providers (APPs -  Physician Assistants and Nurse Practitioners) who all work together to provide you with the care you need, when you need it. You may see Shelva Majestic, MD or one of the following Advanced Practice Providers on your designated Care Team:    Almyra Deforest, PA-C  Fabian Sharp, [place] or   Roby Lofts, [place]  Your physician wants you to follow-up in: 3 months with Dr. Claiborne Billings          Signed, Shelva Majestic, MD  01/04/2020 7:20 PM    [facility] [address], [place], [place], [place]  [address] Phone: [phone]

## 2020-01-02 NOTE — Patient Instructions (Signed)
Medication Instructions:  Your physician recommends that you continue on your current medications as directed. Please refer to the Current Medication list given to you today.  If you need a refill on your cardiac medications before your next appointment, please call your pharmacy.   Lab work: NONE  Testing/Procedures: NONE  Follow-Up: At [facility], you and your health needs are our priority.  As part of our continuing mission to provide you with exceptional heart care, we have created designated Provider Care Teams.  These Care Teams include your primary Cardiologist (physician) and Advanced Practice Providers (APPs -  Physician Assistants and Nurse Practitioners) who all work together to provide you with the care you need, when you need it. You may see Nicki Guadalajara, MD or one of the following Advanced Practice Providers on your designated Care Team:    Azalee Course, PA-C  Micah Flesher, [place] or   Judy Pimple, [place]  Your physician wants you to follow-up in: 3 months with Dr. Tresa Endo

## 2020-01-03 ENCOUNTER — Telehealth: Payer: Self-pay | Admitting: Cardiovascular Disease

## 2020-01-03 NOTE — Telephone Encounter (Signed)
Left message for patient to call and schedule 3 month follow up with Dr. Tresa Endo per 01/03/20 staff message

## 2020-01-04 ENCOUNTER — Encounter: Payer: Self-pay | Admitting: Cardiovascular Disease

## 2020-01-11 NOTE — Telephone Encounter (Signed)
Spoke with patient to schedule 3 month follow up with Dr. Sharol Given states he will call back to schedule

## 2020-04-08 ENCOUNTER — Ambulatory Visit (INDEPENDENT_AMBULATORY_CARE_PROVIDER_SITE_OTHER): Payer: Medicare Other | Admitting: Cardiovascular Disease

## 2020-04-08 ENCOUNTER — Other Ambulatory Visit: Payer: Self-pay

## 2020-04-08 ENCOUNTER — Encounter: Payer: Self-pay | Admitting: Cardiovascular Disease

## 2020-04-08 VITALS — BP 116/70 | HR 63 | Temp 97.0°F | Ht 72.0 in | Wt 234.0 lb

## 2020-04-08 DIAGNOSIS — K219 Gastro-esophageal reflux disease without esophagitis: Secondary | ICD-10-CM

## 2020-04-08 DIAGNOSIS — E785 Hyperlipidemia, unspecified: Secondary | ICD-10-CM

## 2020-04-08 DIAGNOSIS — E669 Obesity, unspecified: Secondary | ICD-10-CM

## 2020-04-08 DIAGNOSIS — I10 Essential (primary) hypertension: Secondary | ICD-10-CM | POA: Diagnosis not present

## 2020-04-08 DIAGNOSIS — I4510 Unspecified right bundle-branch block: Secondary | ICD-10-CM

## 2020-04-08 DIAGNOSIS — I25118 Atherosclerotic heart disease of native coronary artery with other forms of angina pectoris: Secondary | ICD-10-CM

## 2020-04-08 DIAGNOSIS — E118 Type 2 diabetes mellitus with unspecified complications: Secondary | ICD-10-CM

## 2020-04-08 MED ORDER — RANOLAZINE ER 500 MG PO TB12: 500.0000 mg | ORAL_TABLET | Freq: Two times a day (BID) | ORAL | 3 refills | Status: DC

## 2020-04-08 NOTE — Progress Notes (Signed)
Cardiology Office Note    Date:  04/14/2020   ID:  Patrick [name], DOB 12/19/1956, MRN [id number]  PCP:  Kirk Ruths, MD  Cardiologist:  Shelva Majestic, MD    History of Present Illness:  Patrick [name] is a 64 y.o. male who suffered an anterior ST segment elevation myocardial infarction on 08/04/2017.  I saw him for initial office visit in November 2018.  I last saw him in January 2021.  He presents for follow-up evaluation.  Patrick [name] has a history of hypertension, hyperlipidemia, and diabetes mellitus.  He was admitted on 08/04/2017 with ST segment elevation anterolaterally and a code STEMI was activated.  I performed emergent cardiac catheterization which revealed total occlusion of his LAD after the first diagonal vessel.  There was a long segment of occlusion and ultimately a Resolute on a 3.038 mm stent was inserted, postdilated 3.25 mm with 100% occlusion being reduced to 0% and TIMI 0 flow been improved to TIMI 3 flow.  He developed transient heart block during the procedure which required IV atropine.  Of note, his lipid panel during that evaluation was consistent with an atherogenic dyslipidemic pattern with triglycerides of 370, VLDL 74, low HDL at 29, and his total cholesterol was 204 with LDL 101.  He was started on atorvastatin and also was on fenofibrate.  Subsequent, please fenofibrate was discontinued by his primary physician and he has been taking over-the-counter fish oil.  Repeat blood work 1 month later by his primary physician showed a total cholesterol 124, LDL 47, triglycerides were 251, HDL was 26.5.  Over the past several months, he has done well.  He denies any definitive recurrent anginal symptomatology.  He had experienced 1 vague episode of  similar sensation after working 16 hours a day for 3 days and having to go to another business trip in [place].    When I  saw him he had noticed some occasional episodes of heartburn sensation, but also has noted this  with mild exertion.  He was seen by [name], NP on 02/28/2018.  Because of worsening heartburn symptoms and fatigue, which seemed nitrate responsive she recommended a follow-up nuclear perfusion study for reassessment.  His nuclear study  on 03/10/2018 was interpreted as a high risk study suggested ischemia involving both the inferior as well as anterior wall.  There was diffuse hypokinesis.  EF was 45%.  When I saw him in follow-up of the nuclear study I recommended definitive cardiac catheterization.  Cardiac catheterization was done on March 21, 2018. This revealed preserved global LV contractility with a small region of very mild residual mid anterolateral hypocontractility.  Ejection fraction was 50-55%.  The stent in the mid LAD was widely patent and his circumflex and dominant RCA were normal.  Medical therapy was recommended and optimization of blood pressure.  Due to lipid studies that were significantly elevated in August 2018 I recommended the addition of his Vascepa to his atorvastatin.   I saw him in May 2019 and last saw him in October 2019.  At that time he continued to do well.  At the time, he was on amlodipine 10 mg and metoprolol 25 mg twice a day.  His resting pulse was 96 and I recommended titration of metoprolol tartrate to 37.5 mg twice a day.  Laboratory on September 19, 2018 at the [facility]: Total cholesterol was 101, HDL 33, LDL 50, triglycerides had improved to 88. VLDL was 18. He is enrolled  in a study with [name] which is an oral Victoza therapy. Hemoglobin A1c before the study was 8.3 and 1 week ago repeat hemoglobin A1c was 6.8.   I last evaluated him in a telemedicine visit on October 11, 2019.  At that time he had told me that 3 weeks previously he went to the beach and while walking on the beach for 10 to 15 minutes began to notice a substernal chest discomfort.  He was able to walk through it but at a slower pace.  Since that time, he has experienced  some recurrent exertional chest tightness particularly if walking fast up hills.  He has not tried nitroglycerin.  He denies any rest discomfort.  He denies any palpitations.  He denies PND orthopnea.  He had recently seen [name].  He tells me during his recent evaluation his blood pressure was 131/71 and his pulse was 68.  Laboratory revealed a total cholesterol 109, triglycerides 218, VLDL 44, HDL 29.9, and LDL 36.    During theevaluation, I recommended slight titration of meds Toprol 50 mg twice a day, continue amlodipine 10 mg daily and initiated isosorbide 30 mg.  I scheduled him to undergo a nuclear perfusion study for reevaluation and further assessment of his chest pain and potential for ischemia.  When I saw him in November 2020 he admitted to some mild recurrent chest pain episodes.  He had an episode when he was lifting a refrigerator outside and noticed discomfort.  He underwent a nuclear perfusion study on October 19, 2019.  There was a suggestion of a medium defect of mild severity in the basal inferior to mid inferior wall suggestive of possible ischemia.  He did not have any ECG changes during stress and he had normal wall motion and EF at 59% on quantitative analysis.  His chest pain has some atypical features.  He wasa been taking pantoprazole for GERD.  During that evaluation, I recommended increasing isosorbide to 60 mg daily and also suggested a trial of increasing Protonix to twice a day for the next several weeks to see if there was any improvement.  I last saw him on January 02, 2020.  Over the previous  2 months, he denied any major change in symptomatology.  Oftentimes he may noticed the development of some mild chest discomfort approximately 15 minutes into a walk.  He typically does not walk much longer than that so was uncertain if he was able to walk through the pain.  He denied any severe chest pain or significant dyspnea.  There was one occurrence where he had noticed a  vague chest sensation at night.  He has not been successful with weight loss.   Last saw him, he has lost 15 pounds.  He has been walking.  He has been under increased stress with personal issues.  He has continued to be on amlodipine 10 mg, metoprolol tartrate 50 mg twice a day, isosorbide 60 mg daily in addition to losartan 100 mg daily for blood pressure as well as CAD.  He has been on low-dose Brilinta and aspirin for continue DAPT.  He continues to be on pantoprazole for GERD and is diabetic on glimepiride and Metformin.  He still experiences some vague chest pain development intermittently.  He presents for evaluation  Past Medical History:  Diagnosis Date  . CAD in native artery    a. anterolateral STEMI 07/2017 s/p DES to prox-mid LAD, EF 55%.  Marland Kitchen GERD (gastroesophageal reflux disease)   .  Hyperlipidemia   . Hypertension   . Overactive bladder   . Renal calculi   . Uncontrolled diabetes mellitus ([place])     Past Surgical History:  Procedure Laterality Date  . CORONARY/GRAFT ACUTE MI REVASCULARIZATION N/A 08/04/2017   Procedure: Coronary/Graft Acute MI Revascularization;  Surgeon: Troy Sine, MD;  Location: [facility];  Service: Cardiovascular;  Laterality: N/A;  . LEFT HEART CATH AND CORONARY ANGIOGRAPHY N/A 08/04/2017   Procedure: LEFT HEART CATH AND CORONARY ANGIOGRAPHY;  Surgeon: Troy Sine, MD;  Location: [facility];  Service: Cardiovascular;  Laterality: N/A;  . LEFT HEART CATH AND CORONARY ANGIOGRAPHY N/A 03/21/2018   Procedure: LEFT HEART CATH AND CORONARY ANGIOGRAPHY;  Surgeon: Troy Sine, MD;  Location: [facility];  Service: Cardiovascular;  Laterality: N/A;  . PROSTATE SURGERY      Current Medications: Outpatient Medications Prior to Visit  Medication Sig Dispense Refill  . aspirin (ASPIRIN LOW DOSE) 81 MG EC tablet Take 1 tablet (81 mg total) by mouth daily. 90 tablet 0  . atorvastatin (LIPITOR) 40 MG tablet TAKE 1 TABLET(40 MG) BY  MOUTH EVERY EVENING 90 tablet 2  . BRILINTA 60 MG TABS tablet TAKE 1 TABLET BY MOUTH TWICE DAILY 180 tablet 3  . Coenzyme Q10 200 MG capsule Take 200 mg by mouth daily.    Marland Kitchen glimepiride (AMARYL) 2 MG tablet Take 2 mg by mouth daily with breakfast.    . Glucosamine-Chondroit-Vit C-Mn (GLUCOSAMINE 1500 COMPLEX PO) Take 1,500 mg by mouth daily.    Marland Kitchen losartan (COZAAR) 100 MG tablet Take 1 tablet (100 mg total) by mouth daily. 90 tablet 2  . Magnesium 250 MG TABS Take 250 mg by mouth daily.    . Magnesium Citrate 100 MG TABS Take 100 mg by mouth daily.    . metformin (FORTAMET) 500 MG (OSM) 24 hr tablet Take 2,000 mg by mouth 2 (two) times daily with a meal.     . metoprolol tartrate (LOPRESSOR) 50 MG tablet Take 1 tablet (50 mg total) by mouth 2 (two) times daily. 180 tablet 1  . mirabegron ER (MYRBETRIQ) 25 MG TB24 tablet Take 1 tablet (25 mg total) by mouth daily. 30 tablet 11  . Multiple Vitamins-Minerals (ABC PLUS SENIOR PO) Take 1 tablet by mouth daily.    . nitroGLYCERIN (NITROSTAT) 0.4 MG SL tablet Place 1 tablet (0.4 mg total) under the tongue every 5 (five) minutes x 3 doses as needed for chest pain. 25 tablet 3  . Omega-3 Fatty Acids (FISH OIL OMEGA-3 PO) Take 2 capsules by mouth 2 (two) times daily.     . pantoprazole (PROTONIX) 40 MG tablet Take 40 mg by mouth 2 (two) times daily.    . Potassium 99 MG TABS Take 99 mg by mouth daily.    Marland Kitchen pyridOXINE (VITAMIN B-6) 50 MG tablet Take 50 mg by mouth daily.    . tamsulosin (FLOMAX) 0.4 MG CAPS capsule TAKE 1 CAPSULE BY MOUTH DAILY 30 capsule 11  . vitamin C (ASCORBIC ACID) 500 MG tablet Take 500 mg by mouth daily.    . vitamin E 400 UNIT capsule Take 400 Units by mouth daily.    Marland Kitchen amLODipine (NORVASC) 10 MG tablet TAKE 1 TABLET(10 MG) BY MOUTH DAILY 90 tablet 1  . isosorbide mononitrate (IMDUR) 60 MG 24 hr tablet Take 1 tablet (60 mg total) by mouth daily. 90 tablet 3   No facility-administered medications prior to visit.  Allergies:    Patient has no known allergies.   Social History   Socioeconomic History  . Marital status: Married    Spouse name: Not on file  . Number of children: Not on file  . Years of education: Not on file  . Highest education level: Not on file  Occupational History  . Not on file  Tobacco Use  . Smoking status: Never Smoker  . Smokeless tobacco: Never Used  Substance and Sexual Activity  . Alcohol use: No  . Drug use: No  . Sexual activity: Not on file  Other Topics Concern  . Not on file  Social History Narrative  . Not on file   Social Determinants of Health   Financial Resource Strain:   . Difficulty of Paying Living Expenses:   Food Insecurity:   . Worried About Charity fundraiser in the Last Year:   . Arboriculturist in the Last Year:   Transportation Needs:   . Film/video editor (Medical):   Marland Kitchen Lack of Transportation (Non-Medical):   Physical Activity:   . Days of Exercise per Week:   . Minutes of Exercise per Session:   Stress:   . Feeling of Stress :   Social Connections:   . Frequency of Communication with Friends and Family:   . Frequency of Social Gatherings with Friends and Family:   . Attends Religious Services:   . Active Member of Clubs or Organizations:   . Attends Archivist Meetings:   Marland Kitchen Marital Status:      Family History:  The patient's family history includes Heart attack in his father; Heart disease in his father.   ROS General: Negative; No fevers, chills, or night sweats;  HEENT: Negative; No changes in vision or hearing, sinus congestion, difficulty swallowing Pulmonary: Positive for bronchitis Cardiovascular:   See history of present illness GI: Negative; No nausea, vomiting, diarrhea, or abdominal pain GU: Negative; No dysuria, hematuria, or difficulty voiding Musculoskeletal: Negative; no myalgias, joint pain, or weakness Hematologic/Oncology: Negative; no easy bruising, bleeding Endocrine: Positive for diabetes Neuro:  Negative; no changes in balance, headaches Skin: Negative; No rashes or skin lesions Psychiatric: Negative; No behavioral problems, depression Sleep: Negative; No awareness of snoring, daytime sleepiness, hypersomnolence, bruxism, restless legs, hypnogognic hallucinations, no cataplexy Other comprehensive 14 point system review is negative.   PHYSICAL EXAM:   BP 116/70 (BP Location: Left Arm, Patient Position: Sitting, Cuff Size: Large)   Pulse 63   Temp (!) 97 F (36.1 C)   Ht 6' (1.829 m)   Wt 234 lb (106.1 kg)   BMI 31.74 kg/m    Repeat blood pressure by me 120/70  Wt Readings from Last 3 Encounters:  04/08/20 234 lb (106.1 kg)  01/02/20 235 lb (106.6 kg)  11/07/19 236 lb 3.2 oz (107.1 kg)    General: Alert, oriented, no distress.  Skin: normal turgor, no rashes, warm and dry HEENT: Normocephalic, atraumatic. Pupils equal round and reactive to light; sclera anicteric; extraocular muscles intact;  Nose without nasal septal hypertrophy Mouth/Parynx benign; Mallinpatti scale 3 Neck: No JVD, no carotid bruits; normal carotid upstroke Lungs: clear to ausculatation and percussion; no wheezing or rales Chest wall: without tenderness to palpitation Heart: PMI not displaced, RRR, s1 s2 normal, 1/6 systolic murmur, no diastolic murmur, no rubs, gallops, thrills, or heaves Abdomen: soft, nontender; no hepatosplenomehaly, BS+; abdominal aorta nontender and not dilated by palpation. Back: no CVA tenderness Pulses 2+ Musculoskeletal: full range of  motion, normal strength, no joint deformities Extremities: no clubbing cyanosis or edema, Homan's sign negative  Neurologic: grossly nonfocal; Cranial nerves grossly wnl Psychologic: Normal mood and affect    Studies/Labs Reviewed:   ECG (independently read by me): Normal sinus rhythm at 63 bpm.  Right bundle branch block with repolarization changes.  No ectopy.  Normal intervals.  January 02, 2020 ECG (independently read by me): Normal  sinus rhythm at 61 bpm.  Right bundle branch block with repolarization changes.  Normal intervals.  No ectopy.  November 07, 2019 EKG:  EKG is ordered today.  Normal sinus rhythm at 67 bpm.  Right bundle branch block with repolarization changes  October 17 2018 ECG (independently read by me): Normal sinus rhythm at 93 bpm.  Right bundle branch block with repolarization changes.  QTc interval 44 ms.  Apr 25, 2018 ECG (independently read by me): Normal sinus rhythm at 73 bpm.  Right bundle branch block with repolarization changes.  March 16, 2018 ECG (independently read by me): NSR at 63;RBBB with repolarization changes QTc 431 msec  November 2018 ECG (independently read by me): Normal sinus rhythm at 66 bpm.  Right bundle branch block with repolarization changes.  QTc interval 448 ms.  Recent Labs: BMP Latest Ref Rng & Units 03/16/2018 08/05/2017 08/04/2017  Glucose 65 - 99 mg/dL 114(H) 174(H) 367(H)  BUN 8 - 27 mg/dL 23 17 28(H)  Creatinine 0.76 - 1.27 mg/dL 1.09 1.16 1.20  BUN/Creat Ratio 10 - 24 21 - -  Sodium 134 - 144 mmol/L 145(H) 138 136  Potassium 3.5 - 5.2 mmol/L 4.5 3.4(L) 4.7  Chloride 96 - 106 mmol/L 107(H) 105 98(L)  CO2 20 - 29 mmol/L 17(L) 24 -  Calcium 8.6 - 10.2 mg/dL 9.6 8.6(L) -     Hepatic Function Latest Ref Rng & Units 08/04/2017 11/08/2016  Total Protein 6.5 - 8.1 g/dL 6.6 6.6  Albumin 3.5 - 5.0 g/dL 4.1 4.2  AST 15 - 41 U/L 36 31  ALT 17 - 63 U/L 55 30  Alk Phosphatase 38 - 126 U/L 35(L) 27(L)  Total Bilirubin 0.3 - 1.2 mg/dL 0.7 0.5  Bilirubin, Direct 0.1 - 0.5 mg/dL - 0.2    CBC Latest Ref Rng & Units 03/16/2018 08/05/2017 08/04/2017  WBC 3.4 - 10.8 x10E3/uL 6.5 9.5 -  Hemoglobin 13.0 - 17.7 g/dL 15.4 13.5 12.6(L)  Hematocrit 37.5 - 51.0 % 44.0 37.7(L) 37.0(L)  Platelets 150 - 379 x10E3/uL 208 218 -   Lab Results  Component Value Date   MCV 87 03/16/2018   MCV 83.8 08/05/2017   MCV 83.6 08/04/2017   No results found for: TSH Lab Results  Component  Value Date   HGBA1C 9.8 (H) 08/05/2017     BNP No results found for: BNP  ProBNP No results found for: PROBNP   Lipid Panel     Component Value Date/Time   CHOL 204 (H) 08/04/2017 1005   TRIG 370 (H) 08/04/2017 1005   HDL 29 (L) 08/04/2017 1005   CHOLHDL 7.0 08/04/2017 1005   VLDL 74 (H) 08/04/2017 1005   LDLCALC 101 (H) 08/04/2017 1005     RADIOLOGY: No results found.   Additional studies/ records that were reviewed today include:   Emergent cardiac catheterization/PCI 08/04/2017: Conclusion     LV end diastolic pressure is mildly elevated.  The left ventricular systolic function is normal.  A STENT RESOLUTE [id number] drug eluting stent was successfully placed.  Prox LAD to Mid LAD lesion, 100 %  stenosed.  Post intervention, there is a 0% residual stenosis.   Acute ST segment elevation myocardial infarction secondary to total occlusion of the LAD immediately after the takeoff of the first diagonal and septal perforating artery.  There was TIMI 0 flow.  Mild luminal irregularity of the left circumflex vessel without significant obstructive stenoses.  Dominant normal RCA.  Successful PCI to the totally occluded LAD which resulted in a long segment of occlusion and ultimately treated with PTCA and DES stenting with a Resolute onyx 3.038 mm stent postdilated to 3.25 mm with 100% occlusion being reduced to 0% and TIMI 0 flow being improved to TIMI 3 flow.  Transient heart block treated with IV atropine.  Preserved global LV contractility with subtle apical inferior focal hypocontractility.  The patient presented to [facility] emergency room at ~ 8:45 AM.  He presented to [facility] catheterization lab at approximately 10:45 AM.  During balloon time from presentation to [facility] catheterization laboratory was ~ 20 minutes.  RECOMMENDATION: The patient will continue with dual antiplatelet therapy for minimum of a year.  He will be started on high potency statin  therapy, low-dose beta blocker therapy, with ultimate ACE inhibition.  Metformin will be held for 48 hours postcontrast.    03/10/2018 Nuclear Study Highlights     The left ventricular ejection fraction is mildly decreased (45-54%).  Nuclear stress EF: 45%.  There was no ST segment deviation noted during stress.  Findings consistent with ischemia.  This is a high risk study.   TID 1.3 Normalization artifact likely but appears to be both inferior and anterior ischemia Diffuse hypokinesis EF 45%     March 21, 2018 cardiac catheterization conclusion     Previously placed Prox LAD to Mid LAD stent (unknown type) is widely patent.  The left ventricular systolic function is normal.  LV end diastolic pressure is normal.  The left ventricular ejection fraction is 50-55% by visual estimate.  There is no mitral valve regurgitation.   Preserved global LV contractility with a small region of very mild residual mid anterolateral hypocontractility.  Ejection fraction 50-55%.  No significant coronary obstructive disease with evidence for widely patent mid LAD stent after the takeoff of a first diagonal vessel, normal left circumflex and normal dominant RCA.  RECOMMENDATION: Medical therapy.  Continue DAPT in this patient status post anterior ST segment MI August 2018.  Optimize blood pressure.  Continue high potency statin therapy.     NUCLEAR STUDY 10/19/2019  The left ventricular ejection fraction is normal (55-65%).  Nuclear stress EF: 59%.  There was no ST segment deviation noted during stress.  Defect 1: There is a medium defect of mild severity present in the basal inferior and mid inferior location.  Findings consistent with ischemia.  This is a low risk study.   Abnormal, low risk stress nuclear study with mild inferior ischemia.  Gated ejection fraction 59% with normal wall motion.   ASSESSMENT:    1. Coronary artery disease involving native coronary  artery of native heart with other form of angina pectoris ([place])   2. Essential hypertension   3. Chronic GERD   4. Hyperlipidemia LDL goal <70   5. Type 2 diabetes mellitus with complication, without long-term current use of insulin ([place])   6. RBBB   7. Mild obesity      PLAN:  Patrick [name] is a 64 year old gentleman who has a history of hypertension, hyperlipidemia, and diabetes mellitus. On 08/04/2017 he presented with an acute anterior  wall ST segment elevation myocardial infarctions and was found to have total occlusion of his LAD after the first diagonal vessel.  He was found to have a long occluded segment which was successfully treated with DES stenting with a 3.038 mm Resolute DES stent.  A nuclear perfusion study in March 2019 suggested the possibility of ischemia involving both the anterior and inferior wall.  EF was 45% and there was diffuse hypokinesis.  With his blood pressure elevation, and nuclear study suggestive of ischemia, I added amlodipine 5 mg to his regimen and further titrated metoprolol to 37.5 mg twice a day.  He underwent  repeat cardiac catheterization on 03/21/2018 which revealed a widely patent mid LAD stent and a normal left circumflex and dominant RCA.  Subsequently, he has experienced recurrent chest pain symptomatology with plus/minus atypical features.  He seems to notice this within 15 minutes of walking.  Typically goes away on its own.  His most recent nuclear perfusion study was low risk and raised concern for possible mild inferior ischemia which I suspect is most likely  diaphragmatic attenuation since at his last catheterization his RCA and circumflex vessels were entirely normal.  He had normal perfusion in the LAD territory and at last cath his LAD stent was widely patent.  He has not noticed any significant benefit with the transient increase of his Protonix or his isosorbide.  His blood pressure today is controlled on his regimen now consisting of amlodipine  10 mg, isosorbide 60 mg, losartan 100 mg in addition to metoprolol tartrate 50 mg twice a day.  He has chronic right bundle branch block which is stable.  He has continued to take dual antiplatelet therapy with aspirin and low-dose Brilinta 60 mg twice a day.  We discussed the importance of weight loss.  I previously discussed possible esophageal spasm contributing to his discomfort versus the possibility of microvascular angina.  He has recently lost 15 pounds which undoubtedly will be helpful.  However he admits to being under significant increased personal stress particularly with his wife's illness.  I have recommended a trial of ranolazine 500 mg twice a day to see if this improves any of his symptomatology.  He continues to be on atorvastatin 40 mg for hyperlipidemia.  Laboratory is being followed by Dr. Frazier Richards and I do not have recent studies.  Target LDL is less than 70.  He has continued to be on pantoprazole for GERD.  I will see him in 3 months for reevaluation or sooner as needed.  tedication Adjustments/Labs and Tests Ordered: Current medicines are reviewed at length with the patient today.  Concerns regarding medicines are outlined above.  Medication changes, Labs and Tests ordered today are listed in the Patient Instructions below. Patient Instructions  Medication Instructions:  BEGIN TAKING RANOLAZINE 500MG TWICE A DAY  *If you need a refill on your cardiac medications before your next appointment, please call your pharmacy*   Follow-Up: At [facility], you and your health needs are our priority.  As part of our continuing mission to provide you with exceptional heart care, we have created designated Provider Care Teams.  These Care Teams include your primary Cardiologist (physician) and Advanced Practice Providers (APPs -  Physician Assistants and Nurse Practitioners) who all work together to provide you with the care you need, when you need it.  We recommend signing up  for the patient portal called "MyChart".  Sign up information is provided on this After Visit Summary.  MyChart is  used to connect with patients for Virtual Visits (Telemedicine).  Patients are able to view lab/test results, encounter notes, upcoming appointments, etc.  Non-urgent messages can be sent to your provider as well.   To learn more about what you can do with MyChart, go to [email or web address].    Your next appointment:   3 month(s)  The format for your next appointment:   In Person  Provider:   Shelva Majestic, MD        Signed, Shelva Majestic, MD  04/14/2020 12:08 PM    [facility] [address] [place], [place], [place]  [address] Phone: [phone]

## 2020-04-08 NOTE — Patient Instructions (Signed)
Medication Instructions:  BEGIN TAKING RANOLAZINE 500MG  TWICE A DAY  *If you need a refill on your cardiac medications before your next appointment, please call your pharmacy*   Follow-Up: At [facility], you and your health needs are our priority.  As part of our continuing mission to provide you with exceptional heart care, we have created designated Provider Care Teams.  These Care Teams include your primary Cardiologist (physician) and Advanced Practice Providers (APPs -  Physician Assistants and Nurse Practitioners) who all work together to provide you with the care you need, when you need it.  We recommend signing up for the patient portal called "MyChart".  Sign up information is provided on this After Visit Summary.  MyChart is used to connect with patients for Virtual Visits (Telemedicine).  Patients are able to view lab/test results, encounter notes, upcoming appointments, etc.  Non-urgent messages can be sent to your provider as well.   To learn more about what you can do with MyChart, go to [facility].    Your next appointment:   3 month(s)  The format for your next appointment:   In Person  Provider:   [email or web address], MD

## 2020-04-10 ENCOUNTER — Other Ambulatory Visit: Payer: Self-pay | Admitting: Cardiovascular Disease

## 2020-04-14 ENCOUNTER — Encounter: Payer: Self-pay | Admitting: Cardiovascular Disease

## 2020-05-06 ENCOUNTER — Other Ambulatory Visit: Payer: Self-pay | Admitting: Cardiovascular Disease

## 2020-05-13 ENCOUNTER — Other Ambulatory Visit: Payer: Self-pay | Admitting: Cardiovascular Disease

## 2020-07-10 ENCOUNTER — Telehealth: Payer: Self-pay | Admitting: Cardiovascular Disease

## 2020-07-10 NOTE — Telephone Encounter (Signed)
LVM for patient to return call to get scheduled with Kelly from recall list °

## 2020-07-10 NOTE — Telephone Encounter (Signed)
LVM for patient to return call to get appointment scheduled with Tresa Endo from recall list

## 2020-08-28 ENCOUNTER — Other Ambulatory Visit
Admission: RE | Admit: 2020-08-28 | Discharge: 2020-08-28 | Disposition: A | Discharge status: Home / Self Care | Payer: Medicare Other | Source: Ambulatory Visit | Attending: Internal Medicine | Admitting: Internal Medicine

## 2020-08-28 DIAGNOSIS — R0602 Shortness of breath: Secondary | ICD-10-CM | POA: Insufficient documentation

## 2020-08-28 LAB — TROPONIN I (HIGH SENSITIVITY): Troponin I (High Sensitivity): 4 ng/L (ref <18)

## 2020-08-28 LAB — FIBRIN DERIVATIVES D-DIMER (ARMC ONLY): Fibrin derivatives D-dimer (ARMC): 208.93 ng/mL (FEU) (ref 0.00–499.00)

## 2020-09-04 ENCOUNTER — Ambulatory Visit: Payer: Medicare Other | Admitting: General Practice

## 2020-09-11 NOTE — Progress Notes (Signed)
Cardiology Office Note:    Date:  09/12/2020   ID:  Patrick [name], DOB May 13, 1956, MRN [id number]  PCP:  Lauro Regulus, MD  Cardiologist:  Nicki Guadalajara, MD   Referring MD: Lauro Regulus, MD   Chief Complaint  Patient presents with  . Chest Pain    History of Present Illness:    Patrick [name] is a 64 y.o. male with a hx of CAD and STEMI s/p DES to LAD 07/2017, HTN with CKD stage III, HLD, and DM2. Lipid profile improved with statin. He was seen back for chest pain and had a high risk nuclear stress tst 03/10/18 which lead to repeat heart catheterization.  Heart cath showed normal EF, patent LAD stent and no other obstructive disease. Medical therapy was recommended. He was continued on amlodipine, BB, statin, and vascepa. Repeat nuclear stress test 10/19/19 with mild asal to mid inferior ischemia, but deemed overall low risk.  He was last seen 03/2020 and was doing well at that time. A1c was 8.3%.   He presents today for increased shortness of breath and cough -seen by PCP 08/28/20. He tested negative for COVID.    He reports that one year ago when walking on the beach, he had chest pains - resolved when he stopped walking. He discussed with Dr. Tresa Endo and underwent nuclear stress test 09/2019 that showed possible mild basal to mid inferior ischemia. Dr. Tresa Endo felt this was consistent with diaphragmatic attenuation, GERD vs microvascular disease. He started protonix.  Pt did not want to pursue a heart cath because his wife was sick. She has since died in 06-10-2020. He continues to exercise - 0.5-1.0 mile walk 5 days per week. He has mild chest discomfort sometimes with walking up hills, but not on flat ground. He also notes that the hill in question is at the end of his usual 15 min walk.  Three weeks ago, he also started having shortness of breath, possibly congestion. PCP sent him for COVID test which was negative. He was then referred to cardiology.   He is vaccinated with moderna.   He is on a study medication for his DM - SOUL trial for heart disease.   He has not noticed a benefit from protonix, ranexa, or imdur - although on low doses of these.   Past Medical History:  Diagnosis Date  . CAD in native artery    a. anterolateral STEMI 07/2017 s/p DES to prox-mid LAD, EF 55%.  Marland Kitchen GERD (gastroesophageal reflux disease)   . Hyperlipidemia   . Hypertension   . Overactive bladder   . Renal calculi   . Uncontrolled diabetes mellitus (HCC)     Past Surgical History:  Procedure Laterality Date  . CORONARY/GRAFT ACUTE MI REVASCULARIZATION N/A 08/04/2017   Procedure: Coronary/Graft Acute MI Revascularization;  Surgeon: Lennette Bihari, MD;  Location: [facility] INVASIVE CV LAB;  Service: Cardiovascular;  Laterality: N/A;  . LEFT HEART CATH AND CORONARY ANGIOGRAPHY N/A 08/04/2017   Procedure: LEFT HEART CATH AND CORONARY ANGIOGRAPHY;  Surgeon: Lennette Bihari, MD;  Location: [facility] INVASIVE CV LAB;  Service: Cardiovascular;  Laterality: N/A;  . LEFT HEART CATH AND CORONARY ANGIOGRAPHY N/A 03/21/2018   Procedure: LEFT HEART CATH AND CORONARY ANGIOGRAPHY;  Surgeon: Lennette Bihari, MD;  Location: [facility] INVASIVE CV LAB;  Service: Cardiovascular;  Laterality: N/A;  . PROSTATE SURGERY      Current Medications: Current Meds  Medication Sig  . amLODipine (NORVASC) 10 MG tablet TAKE 1  TABLET(10 MG) BY MOUTH DAILY  . aspirin (ASPIRIN LOW DOSE) 81 MG EC tablet Take 1 tablet (81 mg total) by mouth daily.  Marland Kitchen atorvastatin (LIPITOR) 40 MG tablet TAKE 1 TABLET(40 MG) BY MOUTH EVERY EVENING  . BRILINTA 60 MG TABS tablet TAKE 1 TABLET BY MOUTH TWICE DAILY  . Coenzyme Q10 200 MG capsule Take 200 mg by mouth daily.  . famotidine (PEPCID) 40 MG tablet Take 40 mg by mouth daily.  Marland Kitchen glimepiride (AMARYL) 2 MG tablet Take 2 mg by mouth daily with breakfast.  . Glucosamine-Chondroit-Vit C-Mn (GLUCOSAMINE 1500 COMPLEX PO) Take 1,500 mg by mouth daily.  . isosorbide mononitrate (IMDUR) 60 MG 24 hr tablet Take 1  tablet (60 mg total) by mouth daily.  Marland Kitchen losartan (COZAAR) 100 MG tablet TAKE 1 TABLET(100 MG) BY MOUTH DAILY  . Magnesium 250 MG TABS Take 250 mg by mouth daily.  . Magnesium Citrate 100 MG TABS Take 100 mg by mouth daily.  . metformin (FORTAMET) 500 MG (OSM) 24 hr tablet Take 2,000 mg by mouth 2 (two) times daily with a meal.   . metoprolol tartrate (LOPRESSOR) 50 MG tablet TAKE 1 TABLET(50 MG) BY MOUTH TWICE DAILY  . mirabegron ER (MYRBETRIQ) 25 MG TB24 tablet Take 1 tablet (25 mg total) by mouth daily.  . Multiple Vitamins-Minerals (ABC PLUS SENIOR PO) Take 1 tablet by mouth daily.  . nitroGLYCERIN (NITROSTAT) 0.4 MG SL tablet Place 1 tablet (0.4 mg total) under the tongue every 5 (five) minutes x 3 doses as needed for chest pain.  . Omega-3 Fatty Acids (FISH OIL OMEGA-3 PO) Take 2 capsules by mouth 2 (two) times daily.   . pantoprazole (PROTONIX) 40 MG tablet Take 40 mg by mouth 2 (two) times daily.  . Potassium 99 MG TABS Take 99 mg by mouth daily.  Marland Kitchen pyridOXINE (VITAMIN B-6) 50 MG tablet Take 50 mg by mouth daily.  . ranolazine (RANEXA) 500 MG 12 hr tablet Take 1 tablet (500 mg total) by mouth 2 (two) times daily.  . tamsulosin (FLOMAX) 0.4 MG CAPS capsule TAKE 1 CAPSULE BY MOUTH DAILY  . vitamin C (ASCORBIC ACID) 500 MG tablet Take 500 mg by mouth daily.  . vitamin E 400 UNIT capsule Take 400 Units by mouth daily.     Allergies:   Patient has no known allergies.   Social History   Socioeconomic History  . Marital status: Married    Spouse name: Not on file  . Number of children: Not on file  . Years of education: Not on file  . Highest education level: Not on file  Occupational History  . Not on file  Tobacco Use  . Smoking status: Never Smoker  . Smokeless tobacco: Never Used  Substance and Sexual Activity  . Alcohol use: No  . Drug use: No  . Sexual activity: Not on file  Other Topics Concern  . Not on file  Social History Narrative  . Not on file   Social  Determinants of Health   Financial Resource Strain:   . Difficulty of Paying Living Expenses: Not on file  Food Insecurity:   . Worried About Programme researcher, broadcasting/film/video in the Last Year: Not on file  . Ran Out of Food in the Last Year: Not on file  Transportation Needs:   . Lack of Transportation (Medical): Not on file  . Lack of Transportation (Non-Medical): Not on file  Physical Activity:   . Days of Exercise per Week: Not  on file  . Minutes of Exercise per Session: Not on file  Stress:   . Feeling of Stress : Not on file  Social Connections:   . Frequency of Communication with Friends and Family: Not on file  . Frequency of Social Gatherings with Friends and Family: Not on file  . Attends Religious Services: Not on file  . Active Member of Clubs or Organizations: Not on file  . Attends BankerClub or Organization Meetings: Not on file  . Marital Status: Not on file     Family History: The patient's family history includes Heart attack in his father; Heart disease in his father.  ROS:   Please see the history of present illness.     All other systems reviewed and are negative.  EKGs/Labs/Other Studies Reviewed:    The following studies were reviewed today:  Left heart cath 03/21/18:  Previously placed Prox LAD to Mid LAD stent (unknown type) is widely patent.  The left ventricular systolic function is normal.  LV end diastolic pressure is normal.  The left ventricular ejection fraction is 50-55% by visual estimate.  There is no mitral valve regurgitation.   Preserved global LV contractility with a small region of very mild residual mid anterolateral hypocontractility.  Ejection fraction 50-55%.  No significant coronary obstructive disease with evidence for widely patent mid LAD stent after the takeoff of a first diagonal vessel, normal left circumflex and normal dominant RCA.  RECOMMENDATION: Medical therapy.  Continue DAPT in this patient status post anterior ST segment MI  August 2018.  Optimize blood pressure.  Continue high potency statin therapy.    EKG:  EKG is  ordered today.  The ekg ordered today demonstrates sinus rhythm HR 75, RBBB  Recent Labs: No results found for requested labs within last 8760 hours.  Recent Lipid Panel    Component Value Date/Time   CHOL 204 (H) 08/04/2017 1005   TRIG 370 (H) 08/04/2017 1005   HDL 29 (L) 08/04/2017 1005   CHOLHDL 7.0 08/04/2017 1005   VLDL 74 (H) 08/04/2017 1005   LDLCALC 101 (H) 08/04/2017 1005    Physical Exam:    VS:  BP 134/84   Pulse 75   Ht 6' (1.829 m)   Wt 216 lb 12.8 oz (98.3 kg)   SpO2 97%   BMI 29.40 kg/m     Wt Readings from Last 3 Encounters:  09/12/20 216 lb 12.8 oz (98.3 kg)  04/08/20 234 lb (106.1 kg)  01/02/20 235 lb (106.6 kg)     GEN:  Well nourished, well developed in no acute distress HEENT: Normal NECK: No JVD; No carotid bruits LYMPHATICS: No lymphadenopathy CARDIAC: RRR, no murmurs, rubs, gallops RESPIRATORY:  Clear to auscultation without rales, wheezing or rhonchi  ABDOMEN: Soft, non-tender, non-distended MUSCULOSKELETAL:  No edema; No deformity  SKIN: Warm and dry NEUROLOGIC:  Alert and oriented x 3 PSYCHIATRIC:  Normal affect   ASSESSMENT:    1. Chest pain of uncertain etiology   2. Shortness of breath   3. Coronary artery disease involving native coronary artery of native heart with other form of angina pectoris (HCC)   4. Hyperlipidemia LDL goal <70   5. Essential hypertension   6. Type 2 diabetes mellitus with complication, without long-term current use of insulin (HCC)    PLAN:    In order of problems listed above:  Shortness of breath Chest pain - chest pain sounds somewhat atypical - he has not taken nitro because the pain resolves  in less than 3 minutes - he does not have chest pain at the beginning of his walk  - I suspect microvascular disease - we will increase ranexa to 1000 mg BID and increase imdur to 90 mg daily for 1 month - if  he doesn't have improvement with these medication changes, then we will discuss repeat heart cath    CAD s/p STEMI DES-LAD (2018) Last heart cath following abnormal nuclear stress test did not show new or progression of disease - continue ASA, BB    Hypertension - pressure is controlled- increase imdur as above   Hyperlipidemia with LDL goal < 70 - continue statin   DM - well-controlled - A1c 5.7%    Medication Adjustments/Labs and Tests Ordered: Current medicines are reviewed at length with the patient today.  Concerns regarding medicines are outlined above.  No orders of the defined types were placed in this encounter.  No orders of the defined types were placed in this encounter.   Signed, Marcelino Duster, [place]  09/12/2020 9:23 AM    [facility]

## 2020-09-12 ENCOUNTER — Ambulatory Visit (INDEPENDENT_AMBULATORY_CARE_PROVIDER_SITE_OTHER): Payer: Medicare Other | Admitting: Physician Assistant

## 2020-09-12 ENCOUNTER — Other Ambulatory Visit: Payer: Self-pay

## 2020-09-12 ENCOUNTER — Encounter: Payer: Self-pay | Admitting: Physician Assistant

## 2020-09-12 VITALS — BP 134/84 | HR 75 | Ht 72.0 in | Wt 216.8 lb

## 2020-09-12 DIAGNOSIS — I25118 Atherosclerotic heart disease of native coronary artery with other forms of angina pectoris: Secondary | ICD-10-CM | POA: Diagnosis not present

## 2020-09-12 DIAGNOSIS — E785 Hyperlipidemia, unspecified: Secondary | ICD-10-CM | POA: Diagnosis not present

## 2020-09-12 DIAGNOSIS — I10 Essential (primary) hypertension: Secondary | ICD-10-CM

## 2020-09-12 DIAGNOSIS — R0602 Shortness of breath: Secondary | ICD-10-CM

## 2020-09-12 DIAGNOSIS — E118 Type 2 diabetes mellitus with unspecified complications: Secondary | ICD-10-CM

## 2020-09-12 DIAGNOSIS — R079 Chest pain, unspecified: Secondary | ICD-10-CM

## 2020-09-12 MED ORDER — RANOLAZINE ER 1000 MG PO TB12
1000.0000 mg | ORAL_TABLET | Freq: Two times a day (BID) | ORAL | 1 refills | Status: DC
Start: 2020-09-12 — End: 2021-03-05

## 2020-09-12 MED ORDER — ISOSORBIDE MONONITRATE ER 60 MG PO TB24: 90.0000 mg | ORAL_TABLET | Freq: Every day | ORAL | 1 refills | Status: DC

## 2020-09-12 NOTE — Patient Instructions (Signed)
Medication Instructions:   INCREASE Ranexa to 1000 mg twice daily  INCREASE Isosorbide to 90 mg daily  *If you need a refill on your cardiac medications before your next appointment, please call your pharmacy*    Follow-Up: At [facility], you and your health needs are our priority.  As part of our continuing mission to provide you with exceptional heart care, we have created designated Provider Care Teams.  These Care Teams include your primary Cardiologist (physician) and Advanced Practice Providers (APPs -  Physician Assistants and Nurse Practitioners) who all work together to provide you with the care you need, when you need it.  We recommend signing up for the patient portal called "MyChart".  Sign up information is provided on this After Visit Summary.  MyChart is used to connect with patients for Virtual Visits (Telemedicine).  Patients are able to view lab/test results, encounter notes, upcoming appointments, etc.  Non-urgent messages can be sent to your provider as well.   To learn more about what you can do with MyChart, go to [email or web address].    Your next appointment:   Thursday 11/07/20 at 8:40 AM  The format for your next appointment:   Virtual Visit   Provider:   Nicki Guadalajara, MD   Other Instructions Our office will call you a few minutes before your appointment wo task about your medications and to have you take your blood pressure, heart rate and weight. Please have these available when we call you for your appointment.

## 2020-09-25 ENCOUNTER — Ambulatory Visit: Payer: Medicare Other | Admitting: Urology

## 2020-09-28 ENCOUNTER — Other Ambulatory Visit: Payer: Self-pay | Admitting: Urology

## 2020-10-14 NOTE — Progress Notes (Signed)
10/15/2020 3:38 PM   Patrick [name] 03-08-1956 [id number]  Referring provider: Lauro Regulus, MD [address] [facility] [place],  [place] [address]  Chief Complaint  Patient presents with  . Elevated PSA    HPI: 64 year old male who returns today for routine annual follow-up of voiding dysfunction with history of elevated PSA.  Records from Dr. Amada Jupiter office dating back to 2004 which time his PSA was 6.8. It appears that he had biopsy at this time. He also had a biopsy in 2008 when his PSA 6.3. His PSA appears to be fluctuating ranging anywhere from 1.6as high as 6.8. We do not have the results of his previous biopsies but he reports that these are negative.   PSA today is pending.   Component     Latest Ref Rng & Units 03/22/2018 09/26/2019  Prostate Specific Ag, Serum     0.0 - 4.0 ng/mL 3.5 3.2    BPH WITH LUTS  (prostate and/or bladder) IPSS score: 14/2     Major complaint(s): Frequency, nocturia x2, incontinence upon standing and a weak urinary stream at night.  He states he has noticed a diminishing in his urgency.  Denies any dysuria, hematuria or suprapubic pain.   Currently taking: Tamsulosin 0.4 mg and Myrbetriq 25 mg daily   Denies any recent fevers, chills, nausea or vomiting.  He is documented increased EMG activity with all voids from urodynamics in 2018.  He benefited from physical therapy in the past.  No evidence of bladder outlet obstruction on his urodynamics.  He does not snore.  He does have some mild lower extremity edema.  He is never had a sleep study.  In terms of overactivity, he is tried multiple medications in the past including Toviaz, Myrbetriq and oxybutynin.     IPSS    Row Name 10/15/20 1500         International Prostate Symptom Score   How often have you had the sensation of not emptying your bladder? Less than half the time     How often have you had to urinate less than every two hours? About half the  time     How often have you found you stopped and started again several times when you urinated? About half the time     How often have you found it difficult to postpone urination? Less than 1 in 5 times     How often have you had a weak urinary stream? Less than half the time     How often have you had to strain to start urination? Less than 1 in 5 times     How many times did you typically get up at night to urinate? 2 Times     Total IPSS Score 14       Quality of Life due to urinary symptoms   If you were to spend the rest of your life with your urinary condition just the way it is now how would you feel about that? Mostly Satisfied            Score:  1-7 Mild 8-19 Moderate 20-35 Severe   He is also having issues with erectile dysfunction.  He states that he is able to achieve an erection, but they do not last long enough for intercourse.  He denies any pain with erections or curvature with erections.  He does currently take Imdur, so PDE 5 inhibitors are contraindicated at this time.  PMH: Past Medical History:  Diagnosis Date  . CAD in native artery    a. anterolateral STEMI 07/2017 s/p DES to prox-mid LAD, EF 55%.  Marland Kitchen GERD (gastroesophageal reflux disease)   . Hyperlipidemia   . Hypertension   . Overactive bladder   . Renal calculi   . Uncontrolled diabetes mellitus [facility])     Surgical History: Past Surgical History:  Procedure Laterality Date  . CORONARY/GRAFT ACUTE MI REVASCULARIZATION N/A 08/04/2017   Procedure: Coronary/Graft Acute MI Revascularization;  Surgeon: Troy Sine, MD;  Location: [facility];  Service: Cardiovascular;  Laterality: N/A;  . LEFT HEART CATH AND CORONARY ANGIOGRAPHY N/A 08/04/2017   Procedure: LEFT HEART CATH AND CORONARY ANGIOGRAPHY;  Surgeon: Troy Sine, MD;  Location: [facility];  Service: Cardiovascular;  Laterality: N/A;  . LEFT HEART CATH AND CORONARY ANGIOGRAPHY N/A 03/21/2018   Procedure: LEFT HEART CATH AND  CORONARY ANGIOGRAPHY;  Surgeon: Troy Sine, MD;  Location: [facility];  Service: Cardiovascular;  Laterality: N/A;  . PROSTATE SURGERY      Home Medications:  Allergies as of 10/15/2020   No Known Allergies     Medication List       Accurate as of October 15, 2020 11:59 PM. If you have any questions, ask your nurse or doctor.        ABC PLUS SENIOR PO Take 1 tablet by mouth daily.   amLODipine 10 MG tablet Commonly known as: NORVASC TAKE 1 TABLET(10 MG) BY MOUTH DAILY   aspirin 81 MG EC tablet Commonly known as: Aspirin Low Dose Take 1 tablet (81 mg total) by mouth daily.   atorvastatin 40 MG tablet Commonly known as: LIPITOR TAKE 1 TABLET(40 MG) BY MOUTH EVERY EVENING   Brilinta 60 MG Tabs tablet Generic drug: ticagrelor TAKE 1 TABLET BY MOUTH TWICE DAILY   Coenzyme Q10 200 MG capsule Take 200 mg by mouth daily.   famotidine 40 MG tablet Commonly known as: PEPCID Take 40 mg by mouth daily.   FISH OIL OMEGA-3 PO Take 2 capsules by mouth 2 (two) times daily.   glimepiride 2 MG tablet Commonly known as: AMARYL Take 2 mg by mouth daily with breakfast.   GLUCOSAMINE 1500 COMPLEX PO Take 1,500 mg by mouth daily.   isosorbide mononitrate 60 MG 24 hr tablet Commonly known as: IMDUR Take 1.5 tablets (90 mg total) by mouth daily.   losartan 100 MG tablet Commonly known as: COZAAR TAKE 1 TABLET(100 MG) BY MOUTH DAILY   Magnesium 250 MG Tabs Take 250 mg by mouth daily.   Magnesium Citrate 100 MG Tabs Take 100 mg by mouth daily.   metformin 500 MG (OSM) 24 hr tablet Commonly known as: FORTAMET Take 2,000 mg by mouth 2 (two) times daily with a meal.   metoprolol tartrate 50 MG tablet Commonly known as: LOPRESSOR TAKE 1 TABLET(50 MG) BY MOUTH TWICE DAILY   mirabegron ER 25 MG Tb24 tablet Commonly known as: Myrbetriq TAKE 1 TABLET(25 MG) BY MOUTH DAILY   nitroGLYCERIN 0.4 MG SL tablet Commonly known as: NITROSTAT Place 1 tablet (0.4 mg  total) under the tongue every 5 (five) minutes x 3 doses as needed for chest pain.   pantoprazole 40 MG tablet Commonly known as: PROTONIX Take 40 mg by mouth 2 (two) times daily.   Potassium 99 MG Tabs Take 99 mg by mouth daily.   pyridOXINE 50 MG tablet Commonly known as: VITAMIN B-6 Take 50 mg  by mouth daily.   ranolazine 1000 MG SR tablet Commonly known as: RANEXA Take 1 tablet (1,000 mg total) by mouth 2 (two) times daily.   tamsulosin 0.4 MG Caps capsule Commonly known as: FLOMAX Take 1 capsule (0.4 mg total) by mouth daily.   vitamin C 500 MG tablet Commonly known as: ASCORBIC ACID Take 500 mg by mouth daily.   vitamin E 180 MG (400 UNITS) capsule Take 400 Units by mouth daily.       Allergies: No Known Allergies  Family History: Family History  Problem Relation Age of Onset  . Heart disease Father   . Heart attack Father     Social History:  reports that he has never smoked. He has never used smokeless tobacco. He reports that he does not drink alcohol and does not use drugs.  ROS: For pertinent review of systems please refer to history of present illness  Physical Exam: BP 118/71   Pulse 73   Ht 6' (1.829 m)   Wt 207 lb (93.9 kg)   BMI 28.07 kg/m   Constitutional:  Well nourished. Alert and oriented, No acute distress. HEENT: [place] AT, mask in place.  Trachea midline Cardiovascular: No clubbing, cyanosis, or edema. Respiratory: Normal respiratory effort, no increased work of breathing. GU: No CVA tenderness.  No bladder fullness or masses.  Patient with circumcised phallus.  Urethral meatus is patent.  No penile discharge. No penile lesions or rashes. Scrotum without lesions, cysts, rashes and/or edema.  Testicles are located scrotally bilaterally. No masses are appreciated in the testicles. Left and right epididymis are normal.  Erythematous, scaling plaques on inguinal folds.   Rectal: Patient with  normal sphincter tone. Anus and perineum without  scarring or rashes. No rectal masses are appreciated. Prostate is approximately 50 grams, no nodules are appreciated. Seminal vesicles could not be palpated.  Skin: No rashes, bruises or suspicious lesions. Lymph: No inguinal adenopathy. Neurologic: Grossly intact, no focal deficits, moving all 4 extremities. Psychiatric: Normal mood and affect.  Laboratory Data: Specimen:  Blood  Ref Range & Units 13 d ago  Glucose 70 - 110 mg/dL 148High      Sodium 136 - 145 mmol/L 139      Potassium 3.6 - 5.1 mmol/L 4.0      Chloride 97 - 109 mmol/L 104      Carbon Dioxide (CO2) 22.0 - 32.0 mmol/L 27.7      Urea Nitrogen (BUN) 7 - 25 mg/dL 22      Creatinine 0.7 - 1.3 mg/dL 1.0      Glomerular Filtration Rate (eGFR), MDRD Estimate >60 mL/min/1.73sq m 75      Calcium 8.7 - 10.3 mg/dL 8.9      AST  8 - 39 U/L 17      ALT  6 - 57 U/L 24      Alk Phos (alkaline Phosphatase) 34 - 104 U/L 36      Albumin 3.5 - 4.8 g/dL 4.3      Bilirubin, Total 0.3 - 1.2 mg/dL 0.7      Protein, Total 6.1 - 7.9 g/dL 6.0Low      A/G Ratio 1.0 - 5.0 gm/dL 2.5      Resulting Agency  [facility]  Specimen Collected: 10/01/20 8:16 AM Last Resulted: 10/01/20 10:06 AM  Received From: [facility]  Result Received: 10/14/20 12:50 PM    Lab Results  Component Value Date   WBC 6.5 03/16/2018  HGB 15.4 03/16/2018   HCT 44.0 03/16/2018   MCV 87 03/16/2018   PLT 208 03/16/2018    Lab Results  Component Value Date   CREATININE 1.09 03/16/2018   Lab Results  Component Value Date   HGBA1C 9.8 (H) 08/05/2017   Component     Latest Ref Rng & Units 03/22/2018 09/26/2019  Prostate Specific Ag, Serum     0.0 - 4.0 ng/mL 3.5 3.2   Urinalysis Component     Latest Ref Rng & Units 10/15/2020  Specific Gravity, UA     1.005 - 1.030 1.025  pH, UA     5.0 - 7.5 6.0  Color, UA     Yellow Yellow  Appearance Ur     Clear Clear  Leukocytes,UA     Negative Trace (A)   Protein,UA     Negative/Trace Negative  Glucose, UA     Negative Negative  Ketones, UA     Negative Negative  RBC, UA     Negative Negative  Bilirubin, UA     Negative Negative  Urobilinogen, Ur     0.2 - 1.0 mg/dL 0.2  Nitrite, UA     Negative Negative  Microscopic Examination      See below:   Component     Latest Ref Rng & Units 10/15/2020  WBC, UA     0 - 5 /hpf 0-5  RBC     0 - 2 /hpf 0-2  Epithelial Cells (non renal)     0 - 10 /hpf 0-10  Bacteria, UA     None seen/Few None seen   I have reviewed the labs.  Assessment & Plan:    1. History of elevated PSA PSA pending Continue routine annual surveillance Rectal exam up-to-date, unremarkable  2.  Pelvic floor dysfunction Has been engaging in relaxation techniques and has found them beneficial  3.  Nocturia Continue Flomax Managing with Myrbetriq 25 mg daily, does not want to increase dose He is not interested in pursuing a sleep study  4.  Erectile dysfunction Discussed that since he takes nitroglycerin products, he is not a candidate for PDE 5 inhibitors.  We discussed intraurethral suppositories, intercavernous vasoactive drug injection therapy, vacuum constriction device and placement of a penile prosthesis.  He states he has done the injections prior and was not comfortable, but he did take Viagra in the past and found it worked for him.  I explained that since he takes nitroglycerin products, he would need to speak with his cardiologist regarding his desire to take PDE 5 inhibitors for erections.  He states he does have an upcoming appointment with his cardiologist, Dr. Claiborne Billings, and will discuss this further with him.   Return in about 1 year (around 10/15/2021) for IPSS, SHIM, PSA and exam.  Zara Council, PA-C  [facility] [address] [place], [place] [address] [phone]  I spent 30 minutes on the day of the encounter to include pre-visit record review,  face-to-face time with the patient, and post-visit ordering of tests.

## 2020-10-15 ENCOUNTER — Encounter: Payer: Self-pay | Admitting: Urology

## 2020-10-15 ENCOUNTER — Ambulatory Visit (INDEPENDENT_AMBULATORY_CARE_PROVIDER_SITE_OTHER): Payer: Medicare Other | Admitting: Urology

## 2020-10-15 ENCOUNTER — Other Ambulatory Visit: Payer: Self-pay

## 2020-10-15 VITALS — BP 118/71 | HR 73 | Ht 72.0 in | Wt 207.0 lb

## 2020-10-15 DIAGNOSIS — R350 Frequency of micturition: Secondary | ICD-10-CM

## 2020-10-15 DIAGNOSIS — R972 Elevated prostate specific antigen [PSA]: Secondary | ICD-10-CM

## 2020-10-15 DIAGNOSIS — R351 Nocturia: Secondary | ICD-10-CM | POA: Diagnosis not present

## 2020-10-15 DIAGNOSIS — I25118 Atherosclerotic heart disease of native coronary artery with other forms of angina pectoris: Secondary | ICD-10-CM

## 2020-10-15 DIAGNOSIS — N398 Other specified disorders of urinary system: Secondary | ICD-10-CM | POA: Diagnosis not present

## 2020-10-15 MED ORDER — MIRABEGRON ER 25 MG PO TB24: ORAL_TABLET | ORAL | 11 refills | Status: DC

## 2020-10-15 MED ORDER — TAMSULOSIN HCL 0.4 MG PO CAPS: 0.4000 mg | ORAL_CAPSULE | Freq: Every day | ORAL | 3 refills | Status: DC

## 2020-10-15 NOTE — Patient Instructions (Addendum)
The medications that interact with the Viagra is the Imdur and the sub-lingual nitroglycerin.     [employer] medicated powder for the rash

## 2020-10-16 LAB — URINALYSIS, COMPLETE
Bilirubin, UA: NEGATIVE
Glucose, UA: NEGATIVE
Ketones, UA: NEGATIVE
Nitrite, UA: NEGATIVE
Protein,UA: NEGATIVE
RBC, UA: NEGATIVE
Specific Gravity, UA: 1.025 (ref 1.005–1.030)
Urobilinogen, Ur: 0.2 mg/dL (ref 0.2–1.0)
pH, UA: 6 (ref 5.0–7.5)

## 2020-10-16 LAB — MICROSCOPIC EXAMINATION: Bacteria, UA: NONE SEEN

## 2020-10-16 LAB — PSA: Prostate Specific Ag, Serum: 4.1 ng/mL — ABNORMAL HIGH (ref 0.0–4.0)

## 2020-11-07 ENCOUNTER — Encounter: Payer: Self-pay | Admitting: Cardiovascular Disease

## 2020-11-07 ENCOUNTER — Telehealth (INDEPENDENT_AMBULATORY_CARE_PROVIDER_SITE_OTHER): Payer: Medicare Other | Admitting: Cardiovascular Disease

## 2020-11-07 VITALS — Ht 71.0 in | Wt 207.0 lb

## 2020-11-07 DIAGNOSIS — R079 Chest pain, unspecified: Secondary | ICD-10-CM

## 2020-11-07 DIAGNOSIS — N529 Male erectile dysfunction, unspecified: Secondary | ICD-10-CM

## 2020-11-07 DIAGNOSIS — I10 Essential (primary) hypertension: Secondary | ICD-10-CM | POA: Diagnosis not present

## 2020-11-07 DIAGNOSIS — E785 Hyperlipidemia, unspecified: Secondary | ICD-10-CM

## 2020-11-07 DIAGNOSIS — K219 Gastro-esophageal reflux disease without esophagitis: Secondary | ICD-10-CM

## 2020-11-07 DIAGNOSIS — E118 Type 2 diabetes mellitus with unspecified complications: Secondary | ICD-10-CM | POA: Diagnosis not present

## 2020-11-07 DIAGNOSIS — I25118 Atherosclerotic heart disease of native coronary artery with other forms of angina pectoris: Secondary | ICD-10-CM

## 2020-11-07 NOTE — Progress Notes (Signed)
Virtual Visit via Video Note   This visit type was conducted due to national recommendations for restrictions regarding the COVID-19 Pandemic (e.g. social distancing) in an effort to limit this patient's exposure and mitigate transmission in our community.  Due to his co-morbid illnesses, this patient is at least at moderate risk for complications without adequate follow up.  This format is felt to be most appropriate for this patient at this time.  All issues noted in this document were discussed and addressed.  A limited physical exam was performed with this format.  Please refer to the patient's chart for his consent to telehealth for [facility].       Date:  11/07/2020   ID:  Patrick [name], DOB 09-03-1956, MRN [id number] The patient was identified using 2 identifiers.  Patient Location: Home Provider Location: Home Office  PCP:  Kirk Ruths, MD  Cardiologist:  Shelva Majestic, MD  Electrophysiologist:  None   Evaluation Performed:  Follow-Up Visit  Chief Complaint:  7 month F/U  History of Present Illness:    Patrick [name] is a 64 y.o. male  who suffered an anterior ST segment elevation myocardial infarction on 08/04/2017.  I saw him for initial office visit in November 2018.  I last saw him in January 2021.  He presents for follow-up evaluation.  Patrick [name] has a history of hypertension, hyperlipidemia, and diabetes mellitus.  He was admitted on 08/04/2017 with ST segment elevation anterolaterally and a code STEMI was activated.  I performed emergent cardiac catheterization which revealed total occlusion of his LAD after the first diagonal vessel.  There was a long segment of occlusion and ultimately a Resolute on a 3.038 mm stent was inserted, postdilated 3.25 mm with 100% occlusion being reduced to 0% and TIMI 0 flow been improved to TIMI 3 flow.  He developed transient heart block during the procedure which required IV atropine.  Of note, his lipid panel during that  evaluation was consistent with an atherogenic dyslipidemic pattern with triglycerides of 370, VLDL 74, low HDL at 29, and his total cholesterol was 204 with LDL 101.  He was started on atorvastatin and also was on fenofibrate.  Subsequent, please fenofibrate was discontinued by his primary physician and he has been taking over-the-counter fish oil.  Repeat blood work 1 month later by his primary physician showed a total cholesterol 124, LDL 47, triglycerides were 251, HDL was 26.5.  Over the past several months, he has done well.  He denies any definitive recurrent anginal symptomatology.  He had experienced 1 vague episode of  similar sensation after working 16 hours a day for 3 days and having to go to another business trip in [place].    When I  saw him he had noticed some occasional episodes of heartburn sensation, but also has noted this with mild exertion.  He was seen by [name], NP on 02/28/2018.  Because of worsening heartburn symptoms and fatigue, which seemed nitrate responsive she recommended a follow-up nuclear perfusion study for reassessment.  His nuclear study  on 03/10/2018 was interpreted as a high risk study suggested ischemia involving both the inferior as well as anterior wall.  There was diffuse hypokinesis.  EF was 45%.  When I saw him in follow-up of the nuclear study I recommended definitive cardiac catheterization.  Cardiac catheterization was done on March 21, 2018. This revealed preserved global LV contractility with a small region of very mild residual mid anterolateral hypocontractility.  Ejection  fraction was 50-55%.  The stent in the mid LAD was widely patent and his circumflex and dominant RCA were normal.  Medical therapy was recommended and optimization of blood pressure.  Due to lipid studies that were significantly elevated in August 2018 I recommended the addition of his Vascepa to his atorvastatin.   I saw him in May 2019and last saw him in October 2019. At  that time he continued to do well.At the time, he was on amlodipine 10 mg and metoprolol 25 mg twice a day. His resting pulse was 96 and I recommended titration of metoprolol tartrate to 37.5 mg twice a day. Laboratory on September 19, 2018 at the [facility] clinic:Total cholesterol was 101, HDL 33, LDL 50, triglycerides had improved to 88. VLDL was 18. He is enrolled inastudy with [name] which is an oral Victoza therapy. Hemoglobin A1c before the study was 8.3 and 1 week ago repeat hemoglobin A1c was 6.8.  I last evaluated him in a telemedicine visit on October 11, 2019.  At that time he had told me that 3 weeks previously he went to the beach and while walking on the beach for 10 to 15 minutes began to notice a substernal chest discomfort. He was able to walk through it but at a slower pace. Since that time, he has experienced some recurrent exertional chest tightness particularly if walking fast up hills. He has not tried nitroglycerin. He denies any rest discomfort. He denies any palpitations. He denies PND orthopnea. He had recently seen [name]. He tells me during his recent evaluation his blood pressure was 131/71 and his pulse was 68. Laboratory revealed a total cholesterol 109, triglycerides 218, VLDL 44, HDL 29.9, and LDL 36.   During theevaluation, I recommended slight titration of meds Toprol 50 mg twice a day, continue amlodipine 10 mg daily and initiated isosorbide 30 mg.  I scheduled him to undergo a nuclear perfusion study for reevaluation and further assessment of his chest pain and potential for ischemia.  When I saw him in November 2020 he admitted to some mild recurrent chest pain episodes.  He had an episode when he was lifting a refrigerator outside and noticed discomfort.  He underwent a nuclear perfusion study on October 19, 2019.  There was a suggestion of a medium defect of mild severity in the basal inferior to mid inferior wall suggestive of possible  ischemia.  He did not have any ECG changes during stress and he had normal wall motion and EF at 59% on quantitative analysis.  His chest pain has some atypical features.  He wasa been taking pantoprazole for GERD.  During that evaluation, I recommended increasing isosorbide to 60 mg daily and also suggested a trial of increasing Protonix to twice a day for the next several weeks to see if there was any improvement.  I last saw him on January 02, 2020.  Over the previous  2 months, he denied any major change in symptomatology.  Oftentimes he may noticed the development of some mild chest discomfort approximately 15 minutes into a walk.  He typically does not walk much longer than that so was uncertain if he was able to walk through the pain.  He denied any severe chest pain or significant dyspnea.  There was one occurrence where he had noticed a vague chest sensation at night.  He has not been successful with weight loss.   I last saw him in Apr 2021 and since his prior evaluation he had  lost 15 lbs. He was walking. He had been under increased stress with personal issues due to his wife's illness.  He has continued to be on amlodipine 10 mg, metoprolol tartrate 50 mg twice a day, isosorbide 60 mg daily in addition to losartan 100 mg daily for blood pressure as well as CAD.  He has been on low-dose Brilinta and aspirin for continue DAPT.  He continues to be on pantoprazole for GERD and is diabetic on glimepiride and Metformin.  He still experiences some vague chest pain development intermittently. During that evaluation, I recommended reinitiation of Ranexa 500 mg twice a day with potential for future up titration to 1000 mg twice a day. I discussed potential microvascular angina as well as potential esophageal spasm contributing to his chest discomfort.  He did note some benefit but he continues to have some chest discomfort. He was evaluated in the by Fabian Sharp, [place] on 09/12/2020. During that evaluation,  she further titrated ranolazine to 1000 mg twice a day and increased isosorbide to 90 mg daily. Since his medication adjustment, he has able to walk without any recurrent chest pain and feels significantly improved. Unfortunately his wife had passed away. He is followed by [name] who has checked laboratory most recently in Oct 2021. He is participating in the [place] for his diabetes. His most recent hemoglobin A1c had improved to 6.1. His most recent lipid panel is significantly improved from his previously markedly elevated mixed hyperlipidemia profile with total cholesterol 89, triglycerides 83, HDL 32.9, and LDL 40. VLDL is 17. He presents for reevaluation.  The patient does not have symptoms concerning for COVID-19 infection (fever, chills, cough, or new shortness of breath).    Past Medical History:  Diagnosis Date  . CAD in native artery    a. anterolateral STEMI 07/2017 s/p DES to prox-mid LAD, EF 55%.  Marland Kitchen GERD (gastroesophageal reflux disease)   . Hyperlipidemia   . Hypertension   . Overactive bladder   . Renal calculi   . Uncontrolled diabetes mellitus ([place])    Past Surgical History:  Procedure Laterality Date  . CORONARY/GRAFT ACUTE MI REVASCULARIZATION N/A 08/04/2017   Procedure: Coronary/Graft Acute MI Revascularization;  Surgeon: Troy Sine, MD;  Location: [place] [facility];  Service: Cardiovascular;  Laterality: N/A;  . LEFT HEART CATH AND CORONARY ANGIOGRAPHY N/A 08/04/2017   Procedure: LEFT HEART CATH AND CORONARY ANGIOGRAPHY;  Surgeon: Troy Sine, MD;  Location: [place] [facility];  Service: Cardiovascular;  Laterality: N/A;  . LEFT HEART CATH AND CORONARY ANGIOGRAPHY N/A 03/21/2018   Procedure: LEFT HEART CATH AND CORONARY ANGIOGRAPHY;  Surgeon: Troy Sine, MD;  Location: [place] [facility];  Service: Cardiovascular;  Laterality: N/A;  . PROSTATE SURGERY       Current Meds  Medication Sig  . amLODipine (NORVASC) 10 MG tablet TAKE 1 TABLET(10 MG) BY  MOUTH DAILY  . aspirin (ASPIRIN LOW DOSE) 81 MG EC tablet Take 1 tablet (81 mg total) by mouth daily.  Marland Kitchen atorvastatin (LIPITOR) 40 MG tablet TAKE 1 TABLET(40 MG) BY MOUTH EVERY EVENING  . BRILINTA 60 MG TABS tablet TAKE 1 TABLET BY MOUTH TWICE DAILY  . Coenzyme Q10 200 MG capsule Take 200 mg by mouth daily.  Marland Kitchen glimepiride (AMARYL) 2 MG tablet Take 2 mg by mouth daily with breakfast.  . Glucosamine-Chondroit-Vit C-Mn (GLUCOSAMINE 1500 COMPLEX PO) Take 1,500 mg by mouth daily.  . isosorbide mononitrate (IMDUR) 60 MG 24 hr tablet Take 1.5  tablets (90 mg total) by mouth daily.  Marland Kitchen losartan (COZAAR) 100 MG tablet TAKE 1 TABLET(100 MG) BY MOUTH DAILY  . Magnesium 250 MG TABS Take 250 mg by mouth daily.  . Magnesium Citrate 100 MG TABS Take 100 mg by mouth daily.  . metformin (FORTAMET) 500 MG (OSM) 24 hr tablet Take 2,000 mg by mouth 2 (two) times daily with a meal.   . metoprolol tartrate (LOPRESSOR) 50 MG tablet TAKE 1 TABLET(50 MG) BY MOUTH TWICE DAILY  . mirabegron ER (MYRBETRIQ) 25 MG TB24 tablet TAKE 1 TABLET(25 MG) BY MOUTH DAILY  . Multiple Vitamins-Minerals (ABC PLUS SENIOR PO) Take 1 tablet by mouth daily.  . nitroGLYCERIN (NITROSTAT) 0.4 MG SL tablet Place 1 tablet (0.4 mg total) under the tongue every 5 (five) minutes x 3 doses as needed for chest pain.  . Omega-3 Fatty Acids (FISH OIL OMEGA-3 PO) Take 2 capsules by mouth 2 (two) times daily.   . pantoprazole (PROTONIX) 40 MG tablet Take 40 mg by mouth 2 (two) times daily.  . Potassium 99 MG TABS Take 99 mg by mouth daily.  Marland Kitchen pyridOXINE (VITAMIN B-6) 50 MG tablet Take 50 mg by mouth daily.  . ranolazine (RANEXA) 1000 MG SR tablet Take 1 tablet (1,000 mg total) by mouth 2 (two) times daily.  . tamsulosin (FLOMAX) 0.4 MG CAPS capsule Take 1 capsule (0.4 mg total) by mouth daily.  . vitamin C (ASCORBIC ACID) 500 MG tablet Take 500 mg by mouth daily.  . vitamin E 400 UNIT capsule Take 400 Units by mouth daily.     Allergies:   Patient has  no known allergies.   Social History   Tobacco Use  . Smoking status: Never Smoker  . Smokeless tobacco: Never Used  Substance Use Topics  . Alcohol use: No  . Drug use: No     Family Hx: The patient's family history includes Heart attack in his father; Heart disease in his father.  ROS:   Please see the history of present illness.    No fevers chills night sweats No cough No wheezing Resolution of previous chest discomfort on his increased medical regimen Positive for diabetes No edema No abdominal pain Decreased erectile function Sleeping well All other systems reviewed and are negative.   Prior CV studies:   The following studies were reviewed today:   Emergent cardiac catheterization/PCI 08/04/2017: Conclusion     LV end diastolic pressure is mildly elevated.  The left ventricular systolic function is normal.  A STENT RESOLUTE [id number] drug eluting stent was successfully placed.  Prox LAD to Mid LAD lesion, 100 %stenosed.  Post intervention, there is a 0% residual stenosis.  Acute ST segment elevation myocardial infarction secondary to total occlusion of the LAD immediately after the takeoff of the first diagonal and septal perforating artery. There was TIMI 0 flow.  Mild luminal irregularity of the left circumflex vessel without significant obstructive stenoses. Dominant normal RCA.  Successful PCI to the totally occluded LAD which resulted in a long segment of occlusion and ultimately treated with PTCA and DES stenting with a Resolute onyx 3.038 mm stent postdilated to 3.25 mm with 100% occlusion being reduced to 0% and TIMI 0 flow being improved to TIMI 3 flow.  Transient heart block treated with IV atropine.  Preserved global LV contractility with subtle apical inferior focal hypocontractility.  The patient presented to [facility] emergency room at ~ 8:45 AM. He presented to [facility] catheterization lab at approximately 10:45 AM.  Door to  balloon time from presentation to [facility] catheterization laboratory was ~ 20 minutes.  RECOMMENDATION: The patient will continue with dual antiplatelet therapy for minimum of a year. He will be started on high potency statin therapy, low-dose beta blocker therapy, with ultimate ACE inhibition. Metformin will be held for 48 hours postcontrast.    03/10/2018 Nuclear Study Highlights     The left ventricular ejection fraction is mildly decreased (45-54%).  Nuclear stress EF: 45%.  There was no ST segment deviation noted during stress.  Findings consistent with ischemia.  This is a high risk study.  TID 1.3 Normalization artifact likely but appears to be both inferior and anterior ischemia Diffuse hypokinesis EF 45%     March 21, 2018 cardiac catheterization conclusion     Previously placed Prox LAD to Mid LAD stent (unknown type) is widely patent.  The left ventricular systolic function is normal.  LV end diastolic pressure is normal.  The left ventricular ejection fraction is 50-55% by visual estimate.  There is no mitral valve regurgitation.  Preserved global LV contractility with a small region of very mild residual mid anterolateral hypocontractility. Ejection fraction 50-55%.  No significant coronary obstructive disease with evidence for widely patent mid LAD stent after the takeoff of a first diagonal vessel, normal left circumflex and normal dominant RCA.  RECOMMENDATION: Medical therapy. Continue DAPT in this patient status post anterior ST segment MI August 2018. Optimize blood pressure. Continue high potency statin therapy.     NUCLEAR STUDY 10/19/2019  The left ventricular ejection fraction is normal (55-65%).  Nuclear stress EF: 59%.  There was no ST segment deviation noted during stress.  Defect 1: There is a medium defect of mild severity present in the basal inferior and mid inferior location.  Findings consistent with  ischemia.  This is a low risk study.  Abnormal, low risk stress nuclear study with mild inferior ischemia. Gated ejection fraction 59% with normal wall motion.    Labs/Other Tests and Data Reviewed:     I personally reviewed his prior ECG from 04/08/2020 ECG (independently read by me): Normal sinus rhythm at 63 bpm.  Right bundle branch block with repolarization changes.  No ectopy.  Normal intervals.  January 02, 2020 ECG (independently read by me): Normal sinus rhythm at 61 bpm.  Right bundle branch block with repolarization changes.  Normal intervals.  No ectopy.  November 07, 2019 EKG:  EKG is ordered today.  Normal sinus rhythm at 67 bpm.  Right bundle branch block with repolarization changes  October 17 2018 ECG (independently read by me): Normal sinus rhythm at 93 bpm.  Right bundle branch block with repolarization changes.  QTc interval 44 ms.  Apr 25, 2018 ECG (independently read by me): Normal sinus rhythm at 73 bpm.  Right bundle branch block with repolarization changes.  March 16, 2018 ECG (independently read by me): NSR at 63;RBBB with repolarization changes QTc 431 msec  November 2018 ECG (independently read by me): Normal sinus rhythm at 66 bpm.  Right bundle branch block with repolarization changes.  QTc interval 448 ms.  Recent Labs: No results found for requested labs within last 8760 hours.   Recent Lipid Panel Lab Results  Component Value Date/Time   CHOL 204 (H) 08/04/2017 10:05 AM   TRIG 370 (H) 08/04/2017 10:05 AM   HDL 29 (L) 08/04/2017 10:05 AM   CHOLHDL 7.0 08/04/2017 10:05 AM   LDLCALC 101 (H) 08/04/2017 10:05 AM    Wt Readings  from Last 3 Encounters:  11/07/20 207 lb (93.9 kg)  10/15/20 207 lb (93.9 kg)  09/12/20 216 lb 12.8 oz (98.3 kg)       Objective:    Vital Signs:  Ht $R'5\' 11"'gh$  (1.803 m)   Wt 207 lb (93.9 kg)   BMI 28.87 kg/m    This was a video office visit. Patrick [name] was unchanged in appearance from prior  visits Breathing was normal and not labored There did not appear to be any JVD He did not have any chest pain There was no heart rate irregularity There was no swelling in his legs He did not have any chest wall tenderness He had normal affect and mood   ASSESSMENT & PLAN:    1. Coronary artery disease involving native coronary artery of native heart with other form of angina pectoris ([place])   2. Chest pain of uncertain etiology   3. Essential hypertension   4. Type 2 diabetes mellitus with complication, without long-term current use of insulin ([place])   5. Hyperlipidemia LDL goal <70   6. Chronic GERD   7. Erectile dysfunction, unspecified erectile dysfunction type    Patrick [name] is a 64 year old gentleman who has a history of hypertension, hyperlipidemia, diabetes mellitus, who suffered an anterior wall ST segment elevation myocardial infarction in Aug 2018. He was found to have total occlusion of his LAD of the first diagonal vessel and had a long occluded segment successfully treated with a 3.0 x 38 mm Resolute stent. He has had issues with blood pressure necessitating medication titration and also has had recurrent chest pain symptomatology with ultimate repeat cardiac catheterization in 2019 which showed a widely patent stent in the LAD and a circumflex and RCA were dominant. His last nuclear perfusion study was low risk. He had continued to experience increasing episodes of chest pain with some activity despite further titration of his medical regimen. Was last seen by me I raise concern for possible microvascular angina as well as potential esophageal dysmotility contributing to some of his symptomatology. He was started on Ranexa and since his last evaluation with Fabian Sharp, PA, with medication titration to now include Ranexa 1000 mg twice a day, isosorbide 90 mg daily in addition to his amlodipine 10 mg, losartan 100 mg daily and metoprolol 50 mg twice a day his prior symptoms have  completely resolved despite increasing walking particularly up hills. He is on pantoprazole for GERD. In 2018 he had significant mixed hyperlipidemia with triglycerides of 370, very low HDL, increased VLDL and LDL of 101. At that time I suspect he had a significantly increased small LDL particle number. With therapy now consisting of omega fatty acids 2 g twice a day coupled with his atorvastatin 40 mg lipid studies are now excellent with triglycerides at 83 and LDL cholesterol at 40. Unfortunately his wife passed away over the past several months. He has recently met a woman I did bring up discussion with his urologist concerning erectile issues. I discussed with him that since he is on oral nitrate therapy he would not be a candidate for Viagra or Cialis but he may want to discuss other options with his urologist if necessary. He continues to be on low-dose DAPT and denies any bleeding. He is participating in the [place] study for his diabetes mellitus and his hemoglobin A1c has improved from several years ago at 7.8 down to 6.1. I will see him in 6 months for reevaluation or sooner as needed  COVID-19  Education: The signs and symptoms of COVID-19 were discussed with the patient and how to seek care for testing (follow up with PCP or arrange E-visit). The importance of social distancing was discussed today.  Time:   Today, I have spent 26 minutes with the patient with telehealth technology discussing the above problems.     Medication Adjustments/Labs and Tests Ordered: Current medicines are reviewed at length with the patient today.  Concerns regarding medicines are outlined above.   Tests Ordered: No orders of the defined types were placed in this encounter.   Medication Changes: No orders of the defined types were placed in this encounter.   Follow Up:  6 months  Signed, Shelva Majestic, MD  11/07/2020 11:32 AM    [facility]

## 2020-11-07 NOTE — Patient Instructions (Signed)
Medication Instructions:  Your physician recommends that you continue on your current medications as directed. Please refer to the Current Medication list given to you today.  *If you need a refill on your cardiac medications before your next appointment, please call your pharmacy*  Lab Work: NONE  Testing/Procedures: NONE  Follow-Up: At [facility], you and your health needs are our priority.  As part of our continuing mission to provide you with exceptional heart care, we have created designated Provider Care Teams.  These Care Teams include your primary Cardiologist (physician) and Advanced Practice Providers (APPs -  Physician Assistants and Nurse Practitioners) who all work together to provide you with the care you need, when you need it.  We recommend signing up for the patient portal called "MyChart".  Sign up information is provided on this After Visit Summary.  MyChart is used to connect with patients for Virtual Visits (Telemedicine).  Patients are able to view lab/test results, encounter notes, upcoming appointments, etc.  Non-urgent messages can be sent to your provider as well.   To learn more about what you can do with MyChart, go to [email or web address].    Your next appointment:   6 month(s)  You will receive a reminder letter in the mail two months in advance. If you don't receive a letter, please call our office to schedule the follow-up appointment.  The format for your next appointment:   In Person  Provider:   You may see Nicki Guadalajara, MD or one of the following Advanced Practice Providers on your designated Care Team:    Azalee Course, PA-C  Micah Flesher, PA-C or   Judy Pimple, [place]

## 2020-11-08 ENCOUNTER — Ambulatory Visit: Payer: Medicare Other | Admitting: Cardiovascular Disease

## 2020-11-15 ENCOUNTER — Other Ambulatory Visit: Payer: Self-pay | Admitting: Cardiovascular Disease

## 2020-12-25 NOTE — Progress Notes (Signed)
Error

## 2020-12-26 ENCOUNTER — Encounter: Payer: Self-pay | Admitting: Urology

## 2020-12-26 ENCOUNTER — Ambulatory Visit (INDEPENDENT_AMBULATORY_CARE_PROVIDER_SITE_OTHER): Payer: Medicare Other | Admitting: Urology

## 2020-12-26 ENCOUNTER — Other Ambulatory Visit: Payer: Self-pay

## 2020-12-26 VITALS — BP 114/69 | HR 67 | Ht 71.0 in | Wt 207.0 lb

## 2020-12-26 DIAGNOSIS — R972 Elevated prostate specific antigen [PSA]: Secondary | ICD-10-CM

## 2020-12-26 DIAGNOSIS — N529 Male erectile dysfunction, unspecified: Secondary | ICD-10-CM

## 2020-12-26 NOTE — Progress Notes (Signed)
12/26/2020 10:34 PM   Patrick [name] [birth year] [id number]  Referring provider: Lauro Regulus, MD [address] [facility] [place],  [place] [address]  Chief Complaint  Patient presents with  . Erectile Dysfunction   Urological history: 1. Elevated PSA - biopsy 2004 iPSA 6.8 negative - biopsy 2008 iPSA 6.3 negative - PSA Trend Component     Latest Ref Rng & Units 03/22/2018 09/26/2019 10/15/2020  Prostate Specific Ag, Serum     0.0 - 4.0 ng/mL 3.5 3.2 4.1 (H)   2. BPH with LU TS - PSA 4.1 in 09/2020 - I PSS 14/2 - Managed with tamsulosin 0.4 mg daily  3. Pelvic floor dysfunction - documented increased EMG activity with all voids from urodynamics in 2018 - underwent PT  4. Nocturia - managed with Myrbetriq 25 mg daily - not interested in sleep study   HPI: Patrick [name] is a 65 y.o. male who presents today for ED.  He continues to have issues with erectile dysfunction.  He is on nitroglycerin products, so therefore his cardiologist will not clear him for PDE 5 inhibitor use or feel that is appropriate to change his medications.  Patient still having spontaneous erections.  He denies any pain or curvature with erections.   PMH: Past Medical History:  Diagnosis Date  . CAD in native artery    a. anterolateral STEMI 07/2017 s/p DES to prox-mid LAD, EF 55%.  Marland Kitchen GERD (gastroesophageal reflux disease)   . Hyperlipidemia   . Hypertension   . Overactive bladder   . Renal calculi   . Uncontrolled diabetes mellitus [facility])     Surgical History: Past Surgical History:  Procedure Laterality Date  . CORONARY/GRAFT ACUTE MI REVASCULARIZATION N/A 08/04/2017   Procedure: Coronary/Graft Acute MI Revascularization;  Surgeon: Lennette Bihari, MD;  Location: [facility] INVASIVE CV LAB;  Service: Cardiovascular;  Laterality: N/A;  . LEFT HEART CATH AND CORONARY ANGIOGRAPHY N/A 08/04/2017   Procedure: LEFT HEART CATH AND CORONARY ANGIOGRAPHY;  Surgeon: Lennette Bihari,  MD;  Location: [facility] INVASIVE CV LAB;  Service: Cardiovascular;  Laterality: N/A;  . LEFT HEART CATH AND CORONARY ANGIOGRAPHY N/A 03/21/2018   Procedure: LEFT HEART CATH AND CORONARY ANGIOGRAPHY;  Surgeon: Lennette Bihari, MD;  Location: [facility] INVASIVE CV LAB;  Service: Cardiovascular;  Laterality: N/A;  . PROSTATE SURGERY      Home Medications:  Allergies as of 12/26/2020   No Known Allergies     Medication List       Accurate as of December 26, 2020 11:59 PM. If you have any questions, ask your nurse or doctor.        ABC PLUS SENIOR PO Take 1 tablet by mouth daily.   amLODipine 10 MG tablet Commonly known as: NORVASC TAKE 1 TABLET(10 MG) BY MOUTH DAILY   aspirin 81 MG EC tablet Commonly known as: Aspirin Low Dose Take 1 tablet (81 mg total) by mouth daily.   atorvastatin 40 MG tablet Commonly known as: LIPITOR TAKE 1 TABLET(40 MG) BY MOUTH EVERY EVENING   Brilinta 60 MG Tabs tablet Generic drug: ticagrelor TAKE 1 TABLET BY MOUTH TWICE DAILY   Coenzyme Q10 200 MG capsule Take 200 mg by mouth daily.   FISH OIL OMEGA-3 PO Take 2 capsules by mouth 2 (two) times daily.   glimepiride 2 MG tablet Commonly known as: AMARYL Take 2 mg by mouth daily with breakfast.   GLUCOSAMINE 1500 COMPLEX PO Take 1,500 mg by  mouth daily.   isosorbide mononitrate 60 MG 24 hr tablet Commonly known as: IMDUR Take 1.5 tablets (90 mg total) by mouth daily.   losartan 100 MG tablet Commonly known as: COZAAR TAKE 1 TABLET(100 MG) BY MOUTH DAILY   Magnesium 250 MG Tabs Take 250 mg by mouth daily.   Magnesium Citrate 100 MG Tabs Take 100 mg by mouth daily.   metformin 500 MG (OSM) 24 hr tablet Commonly known as: FORTAMET Take 2,000 mg by mouth 2 (two) times daily with a meal.   metoprolol tartrate 50 MG tablet Commonly known as: LOPRESSOR TAKE 1 TABLET(50 MG) BY MOUTH TWICE DAILY   mirabegron ER 25 MG Tb24 tablet Commonly known as: Myrbetriq TAKE 1 TABLET(25 MG) BY MOUTH DAILY    nitroGLYCERIN 0.4 MG SL tablet Commonly known as: NITROSTAT Place 1 tablet (0.4 mg total) under the tongue every 5 (five) minutes x 3 doses as needed for chest pain.   pantoprazole 40 MG tablet Commonly known as: PROTONIX Take 40 mg by mouth 2 (two) times daily.   Potassium 99 MG Tabs Take 99 mg by mouth daily.   pyridOXINE 50 MG tablet Commonly known as: VITAMIN B-6 Take 50 mg by mouth daily.   ranolazine 1000 MG SR tablet Commonly known as: RANEXA Take 1 tablet (1,000 mg total) by mouth 2 (two) times daily.   tamsulosin 0.4 MG Caps capsule Commonly known as: FLOMAX Take 1 capsule (0.4 mg total) by mouth daily.   vitamin C 500 MG tablet Commonly known as: ASCORBIC ACID Take 500 mg by mouth daily.   vitamin E 180 MG (400 UNITS) capsule Take 400 Units by mouth daily.       Allergies: No Known Allergies  Family History: Family History  Problem Relation Age of Onset  . Heart disease Father   . Heart attack Father     Social History:  reports that he has never smoked. He has never used smokeless tobacco. He reports that he does not drink alcohol and does not use drugs.  ROS: For pertinent review of systems please refer to history of present illness  Physical Exam: BP 114/69   Pulse 67   Ht 5\' 11"  (1.803 m)   Wt 207 lb (93.9 kg)   BMI 28.87 kg/m    Constitutional:  Well nourished. Alert and oriented, No acute distress. HEENT: [place] AT, mask in place.  Trachea midline Cardiovascular: No clubbing, cyanosis, or edema. Respiratory: Normal respiratory effort, no increased work of breathing. Neurologic: Grossly intact, no focal deficits, moving all 4 extremities. Psychiatric: Normal mood and affect.  Laboratory Data: No recent labs  Assessment & Plan:    1. Elevated PSA PSA pending  2.  Pelvic floor dysfunction Continue relaxation techniques  3. BPH with LU TS Continue Flomax  4.  Nocturia Continue Myrbetriq 25 mg daily  4.  Erectile dysfunction We  reviewed his treatment options in lieu of PDE 5 inhibitors as they are contraindicated for him.  We discussed that these other treatments tend to be more costly as insurance rarely covers them.  The most cost effective approach would be ICI.  If he is interested in these I explained that we have a titration appointment and if the satisfactory erection is achieved I instruct him how to inject himself.  He is getting married in a few months and would like to return to that issue closer to his wedding date.  Return for pending PSA .  , PA-C  [facility] [address] [place], [place] [address] [phone]  I spent 30 minutes on the day of the encounter to include pre-visit record review, face-to-face time with the patient, and post-visit ordering of tests.

## 2020-12-27 LAB — PSA: Prostate Specific Ag, Serum: 4.5 ng/mL — ABNORMAL HIGH (ref 0.0–4.0)

## 2021-01-07 ENCOUNTER — Telehealth: Payer: Self-pay | Admitting: Family Medicine

## 2021-01-07 DIAGNOSIS — R972 Elevated prostate specific antigen [PSA]: Secondary | ICD-10-CM

## 2021-01-07 NOTE — Telephone Encounter (Signed)
-----   Message from Harle Battiest, PA-C sent at 01/06/2021  9:31 AM EST ----- Please let Mr. Patrick [name] know that his PSA has increased a bit since two months ago, I would like to check it again in 4 months to make sure the trend doesn't continue upward.

## 2021-01-07 NOTE — Telephone Encounter (Signed)
Patient notified and voiced understanding. Appointment for lab has been scheduled. 

## 2021-02-05 MED ORDER — AMBULATORY NON FORMULARY MEDICATION: 0 refills | Status: DC

## 2021-02-05 NOTE — Telephone Encounter (Signed)
Spoke to patient and informed him the RX was faxed to [facility]. An appointment has been made for injection teaching.

## 2021-02-12 NOTE — Progress Notes (Signed)
Patrick [name] presents today for a ICI titration.  He is no longer spontaneous erections.  He has had moderate response to PDE5i's. He denies any history of sickle cell anemia or trait, a history of multiple myeloma or a history of leukemia.  He has not taken trazodone or a PDE5i's today.   Patient's left corpus cavernosum is identified.  An area near the base of the penis is cleansed with rubbing alcohol.  Careful to avoid the dorsal vein, 2 mcg of Trimix (papaverine 30 mg, phentolamine 1 mg and prostaglandin E1 10 mcg, Lot # [id number]'@65'[id number]$  exp # 03/14/2021) is injected at a 90 degree angle into the left corpus cavernosum near the base of the penis.  Patient experienced a semi-firm erection in 15 minutes.    Patient's right corpus cavernosum is identified.  An area near the base of the penis is cleansed with rubbing alcohol.  Careful to avoid the dorsal vein, 2 mcg of Trimix (papaverine 30 mg, phentolamine 1 mg and prostaglandin E1 10 mcg, Lot # [id number]'@65'[id number]$  exp # 03/14/2021) is injected at a 90 degree angle into the right corpus cavernosum near the base of the penis.  Patient experienced a semi-firm erection in 15 minutes.    Patient's left corpus cavernosum is identified.  An area near the base of the penis is cleansed with rubbing alcohol.  Careful to avoid the dorsal vein, 2 mcg of Trimix (papaverine 30 mg, phentolamine 1 mg and prostaglandin E1 10 mcg, Lot # [id number]$RemoveBefor'@65'EEPyhsRIumpc$  exp # 03/14/2021) is injected at a 90 degree angle into the left corpus cavernosum near the base of the penis.  Patient experienced a semi-firm erection in 15 minutes.    We we have another ICI titration with a higher potency of Trimix in the next few weeks.  We will start at 4 mcg.    Advised patient of the condition of priapism, painful erection lasting for more than four hours, and to contact the office immediately or seek treatment in the ED

## 2021-02-13 ENCOUNTER — Other Ambulatory Visit: Payer: Self-pay

## 2021-02-13 ENCOUNTER — Ambulatory Visit (INDEPENDENT_AMBULATORY_CARE_PROVIDER_SITE_OTHER): Payer: Medicare Other | Admitting: Urology

## 2021-02-13 ENCOUNTER — Telehealth: Payer: Self-pay | Admitting: Urology

## 2021-02-13 ENCOUNTER — Encounter: Payer: Self-pay | Admitting: Urology

## 2021-02-13 VITALS — BP 147/66 | HR 70 | Ht 71.0 in | Wt 200.0 lb

## 2021-02-13 DIAGNOSIS — N529 Male erectile dysfunction, unspecified: Secondary | ICD-10-CM | POA: Diagnosis not present

## 2021-02-13 MED ORDER — AMBULATORY NON FORMULARY MEDICATION
0 refills | Status: DC
Start: 2021-02-13 — End: 2021-03-19

## 2021-02-13 NOTE — Telephone Encounter (Signed)
Would you send in a script for the high dose Trimix to [facility]?

## 2021-02-13 NOTE — Telephone Encounter (Signed)
Rx faxed to [facility] 

## 2021-02-18 ENCOUNTER — Ambulatory Visit: Payer: Self-pay | Admitting: Urology

## 2021-02-26 ENCOUNTER — Ambulatory Visit: Payer: Self-pay | Admitting: Urology

## 2021-03-05 ENCOUNTER — Other Ambulatory Visit: Payer: Self-pay | Admitting: Cardiovascular Disease

## 2021-03-05 NOTE — Progress Notes (Signed)
Patrick [name] presents today for a ICI titration.  He is no longer spontaneous erections.  He has had moderate response to PDE5i's. He denies any history of sickle cell anemia or trait, a history of multiple myeloma or a history of leukemia.  He has not taken trazodone or a PDE5i's today.   Patient's left corpus cavernosum is identified.  An area near the base of the penis is cleansed with rubbing alcohol.  Careful to avoid the dorsal vein, 4 mcg of Trimix (papaverine 30 mg, phentolamine 2 mg and prostaglandin E1 20 mcg, Lot # [id number]>_3<\/[id number]>_2  exp # 03/27/2021) is injected at a 90 degree angle into the left corpus cavernosum near the base of the penis.  Patient experienced a semi-firm erection in 15 minutes.    Patient's right corpus cavernosum is identified.  An area near the base of the penis is cleansed with rubbing alcohol.  Careful to avoid the dorsal vein, 2 mcg of Trimix (papaverine 30 mg, phentolamine 1 mg and prostaglandin E1 10 mcg, Lot # [id number]>_3<\/JSHFWYOVZCHYIFOY>_7  exp # 03/27/2021) is injected at a 90 degree angle into the right corpus cavernosum near the base of the penis.  Patient experienced a firm erection in 15 minutes.    Advised patient of the condition of priapism, painful erection lasting for more than four hours, and to contact the office immediately or seek treatment in the ED

## 2021-03-06 ENCOUNTER — Ambulatory Visit (INDEPENDENT_AMBULATORY_CARE_PROVIDER_SITE_OTHER): Payer: Medicare Other | Admitting: Urology

## 2021-03-06 ENCOUNTER — Encounter: Payer: Self-pay | Admitting: Urology

## 2021-03-06 ENCOUNTER — Other Ambulatory Visit: Payer: Self-pay

## 2021-03-06 VITALS — BP 121/72 | HR 75 | Ht 69.0 in | Wt 201.0 lb

## 2021-03-06 DIAGNOSIS — N529 Male erectile dysfunction, unspecified: Secondary | ICD-10-CM

## 2021-03-19 ENCOUNTER — Telehealth: Payer: Self-pay

## 2021-03-19 MED ORDER — AMBULATORY NON FORMULARY MEDICATION: 0 refills | Status: DC

## 2021-03-19 NOTE — Telephone Encounter (Signed)
Rx faxed to [facility] and msg sent to pt.

## 2021-03-19 NOTE — Telephone Encounter (Signed)
We can go ahead an send in a refill for his Trimix (30/2/20) to [facility] and he can inject 7 mcg of the new medication.    I think the unused bottles will not be very effective, but he can try to inject 1 mg.

## 2021-03-19 NOTE — Telephone Encounter (Signed)
Rx keeps jumping pharmacy, so rx printed and manually faxed.

## 2021-03-19 NOTE — Addendum Note (Signed)
Addended by: Sueanne Margarita on: 03/19/2021 02:31 PM   Modules accepted: Orders

## 2021-03-19 NOTE — Addendum Note (Signed)
Addended by: Sueanne Margarita on: 03/19/2021 01:19 PM   Modules accepted: Orders

## 2021-03-19 NOTE — Addendum Note (Signed)
Addended by: Sueanne Margarita on: 03/19/2021 02:27 PM   Modules accepted: Orders

## 2021-03-19 NOTE — Telephone Encounter (Signed)
[facility] called. Trimix was sent to them instead of a different pharmacy.

## 2021-03-19 NOTE — Telephone Encounter (Signed)
Trimix went to [facility]

## 2021-03-29 ENCOUNTER — Emergency Department: Payer: Medicare Other

## 2021-03-29 ENCOUNTER — Other Ambulatory Visit: Payer: Self-pay

## 2021-03-29 ENCOUNTER — Emergency Department
Admission: EM | Admit: 2021-03-29 | Discharge: 2021-03-29 | Disposition: A | Discharge status: Home / Self Care | Payer: Medicare Other | Attending: Emergency Medicine | Admitting: Emergency Medicine

## 2021-03-29 ENCOUNTER — Other Ambulatory Visit: Payer: Self-pay | Admitting: Cardiovascular Disease

## 2021-03-29 DIAGNOSIS — I2510 Atherosclerotic heart disease of native coronary artery without angina pectoris: Secondary | ICD-10-CM | POA: Diagnosis not present

## 2021-03-29 DIAGNOSIS — H9319 Tinnitus, unspecified ear: Secondary | ICD-10-CM | POA: Insufficient documentation

## 2021-03-29 DIAGNOSIS — N183 Chronic kidney disease, stage 3 unspecified: Secondary | ICD-10-CM | POA: Insufficient documentation

## 2021-03-29 DIAGNOSIS — Z79899 Other long term (current) drug therapy: Secondary | ICD-10-CM | POA: Diagnosis not present

## 2021-03-29 DIAGNOSIS — E860 Dehydration: Secondary | ICD-10-CM | POA: Diagnosis not present

## 2021-03-29 DIAGNOSIS — R42 Dizziness and giddiness: Secondary | ICD-10-CM | POA: Diagnosis present

## 2021-03-29 DIAGNOSIS — Z951 Presence of aortocoronary bypass graft: Secondary | ICD-10-CM | POA: Insufficient documentation

## 2021-03-29 DIAGNOSIS — I1310 Hypertensive heart and chronic kidney disease without heart failure, with stage 1 through stage 4 chronic kidney disease, or unspecified chronic kidney disease: Secondary | ICD-10-CM | POA: Diagnosis not present

## 2021-03-29 DIAGNOSIS — Z7982 Long term (current) use of aspirin: Secondary | ICD-10-CM | POA: Insufficient documentation

## 2021-03-29 DIAGNOSIS — Z7984 Long term (current) use of oral hypoglycemic drugs: Secondary | ICD-10-CM | POA: Diagnosis not present

## 2021-03-29 LAB — BASIC METABOLIC PANEL
Anion gap: 8 (ref 5–15)
BUN: 32 mg/dL — ABNORMAL HIGH (ref 8–23)
CO2: 26 mmol/L (ref 22–32)
Calcium: 9.1 mg/dL (ref 8.9–10.3)
Chloride: 104 mmol/L (ref 98–111)
Creatinine, Ser: 1.13 mg/dL (ref 0.61–1.24)
GFR, Estimated: >60 mL/min (ref >60)
Glucose, Bld: 163 mg/dL — ABNORMAL HIGH (ref 70–99)
Potassium: 4.1 mmol/L (ref 3.5–5.1)
Sodium: 138 mmol/L (ref 135–145)

## 2021-03-29 LAB — CBC
HCT: 39.9 % (ref 39.0–52.0)
Hemoglobin: 14.2 g/dL (ref 13.0–17.0)
MCH: 32.8 pg (ref 26.0–34.0)
MCHC: 35.6 g/dL (ref 30.0–36.0)
MCV: 92.1 fL (ref 80.0–100.0)
Platelets: 194 10*3/uL (ref 150–400)
RBC: 4.33 MIL/uL (ref 4.22–5.81)
RDW: 12.7 % (ref 11.5–15.5)
WBC: 7.9 10*3/uL (ref 4.0–10.5)
nRBC: 0 % (ref 0.0–0.2)

## 2021-03-29 IMAGING — MR MR HEAD W/O CM
13 series · 48 of 48 positions shown · non-contrast
Comparison: CT head [DATE]

CLINICAL DATA: Dizziness



[Series 5: ax dwi_tracew · axial · B · 3.0mm · 0.71mm/px · z∈[-65,+99]mm · 7 of 112 slices shown]
[im 1/112]
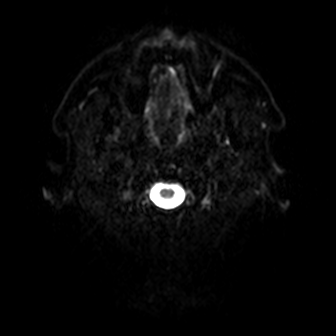
[im 19/112]
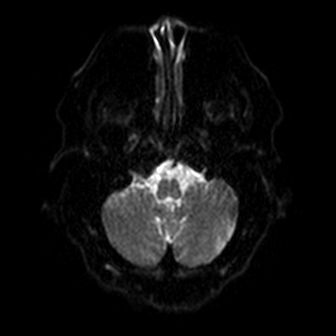
[im 38/112]
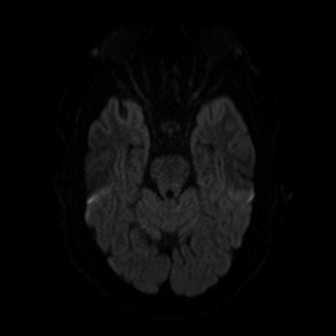
[im 56/112]
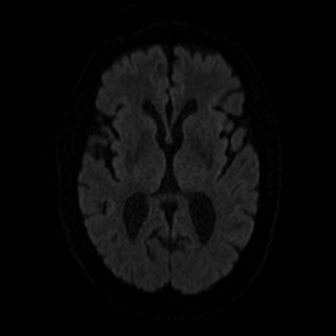
[im 75/112]
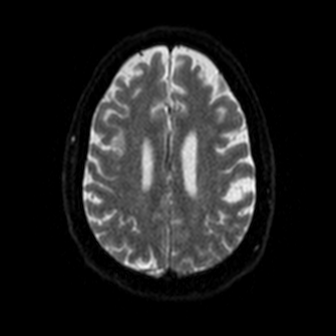
[im 93/112]
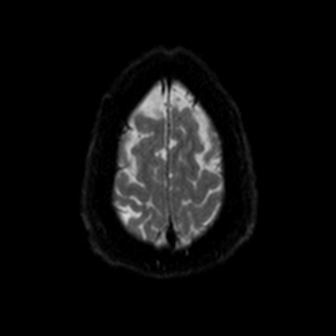
[im 112/112]
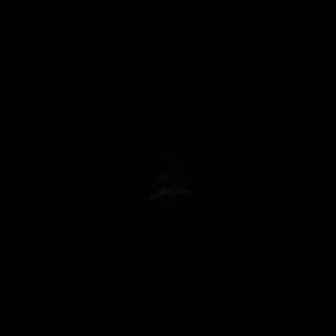

[Series 6: ax dwi_adc · axial · B · 3.0mm · 0.71mm/px · z∈[-65,+99]mm · 3 of 56 slices shown]
[im 1/56]
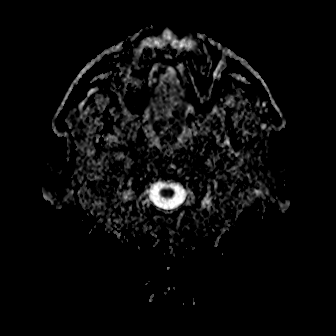
[im 28/56]
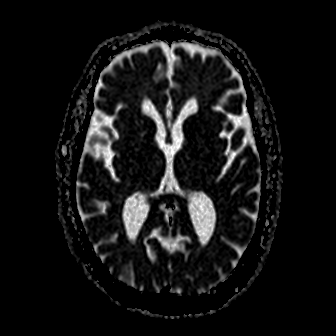
[im 56/56]
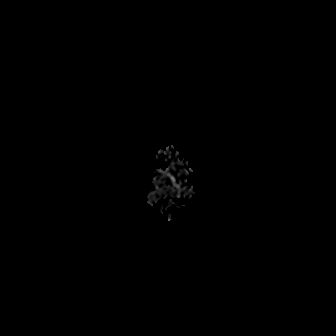

[Series 7: cor dwi_tracew · coronal · B · 5.0mm · 0.68mm/px · 5 of 80 slices shown]
[im 1/80]
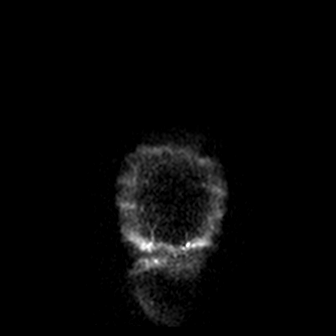
[im 20/80]
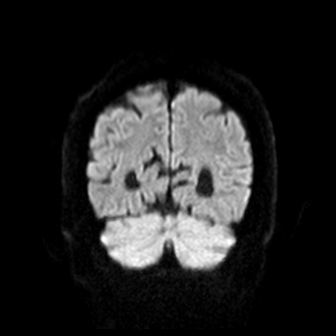
[im 40/80]
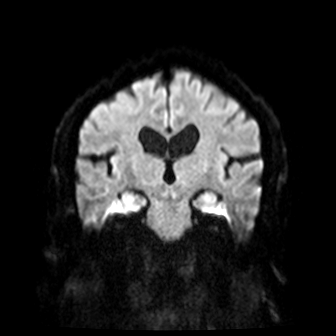
[im 60/80]
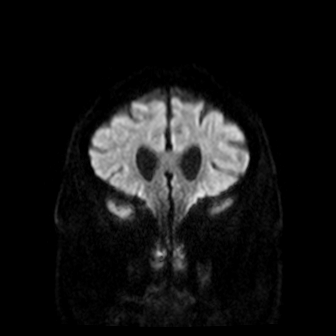
[im 80/80]
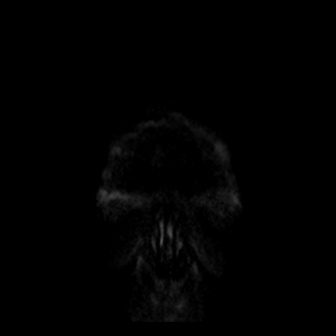

[Series 8: cor dwi_adc · coronal · B · 5.0mm · 0.68mm/px · 2 of 40 slices shown]
[im 1/40]
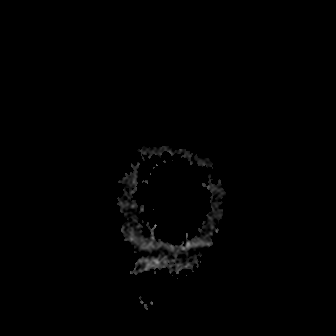
[im 40/40]
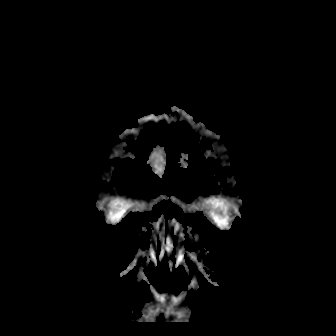

[Series 9: T1 · sagittal · B · 5.0mm · 0.47mm/px · 1 of 24 slices shown (1 of 2)]
[im 1/24]
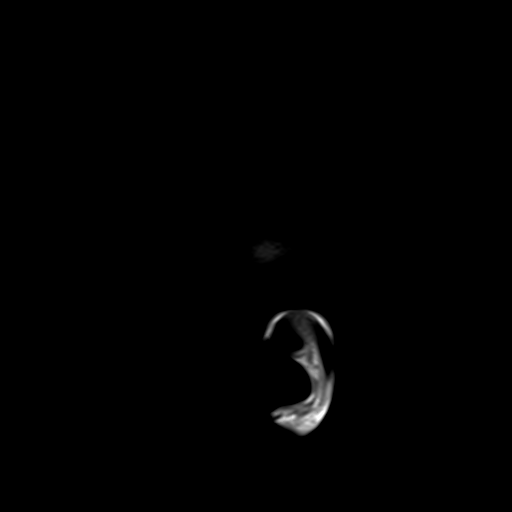

[Series 10: T2 · axial · B · 5.0mm · 0.86mm/px · z∈[-56,+88]mm · 2 of 25 slices shown (1 of 2)]
[im 1/25]
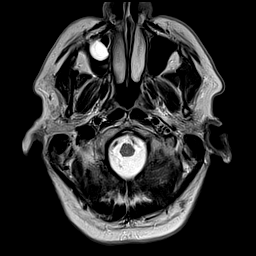
[im 25/25]
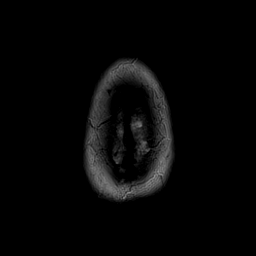

[Series 11: mag_images · axial · B · 3.0mm · 0.90mm/px · z∈[-60,+93]mm · 3 of 52 slices shown]
[im 1/52]
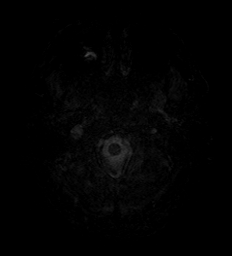
[im 26/52]
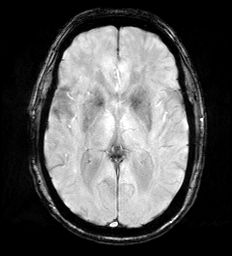
[im 52/52]
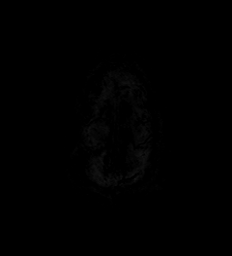

[Series 12: pha_images · axial · B · 3.0mm · 0.90mm/px · z∈[-60,+93]mm · 3 of 52 slices shown]
[im 1/52]
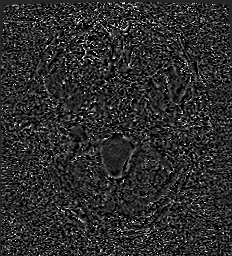
[im 26/52]
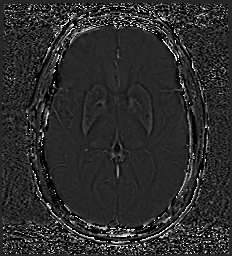
[im 52/52]
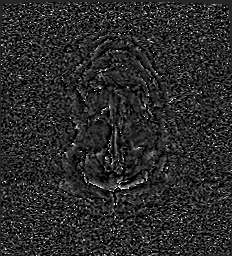

[Series 13: swi_images · axial · B · 3.0mm · 0.90mm/px · z∈[-60,+93]mm · 3 of 52 slices shown]
[im 1/52]
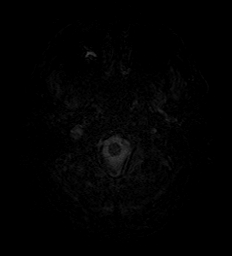
[im 26/52]
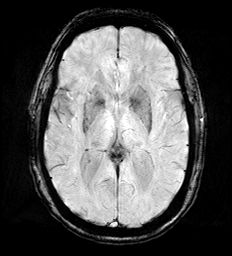
[im 52/52]
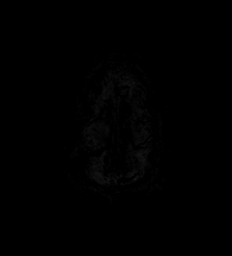

[Series 14: mip_images(sw) · axial · B · 24.0mm · 0.90mm/px · z∈[-50,+82]mm · 3 of 45 slices shown]
[im 1/45]
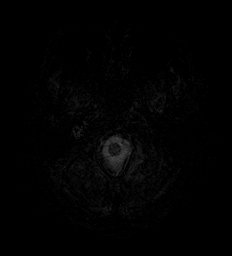
[im 23/45]
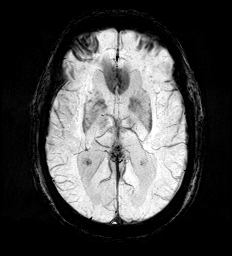
[im 45/45]
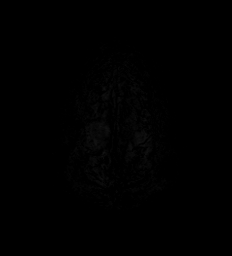

[Series 15: FLAIR · axial · B · 3.0mm · 0.69mm/px · z∈[-65,+97]mm · 3 of 55 slices shown]
[im 1/55]
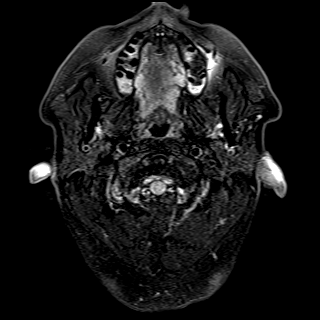
[im 28/55]
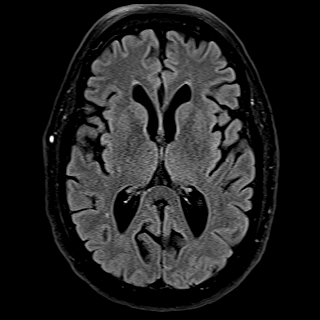
[im 55/55]
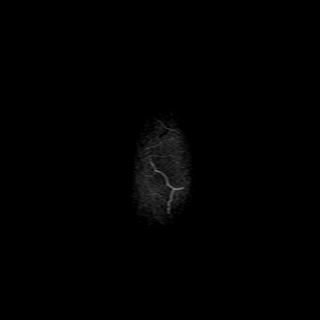

[Series 16: T1 · axial · B · 1.0mm · 0.98mm/px · z∈[-73,+101]mm · 11 of 175 slices shown (2 of 2)]
[im 1/175]
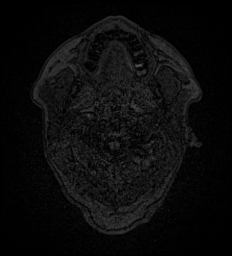
[im 18/175]
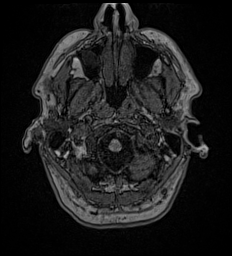
[im 35/175]
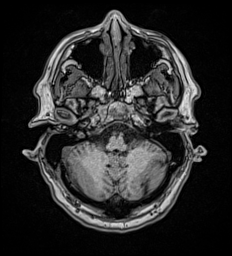
[im 53/175]
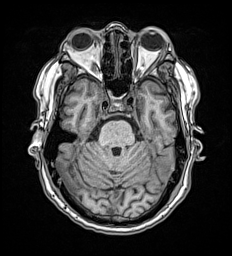
[im 70/175]
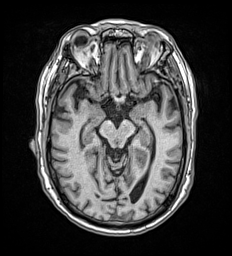
[im 88/175]
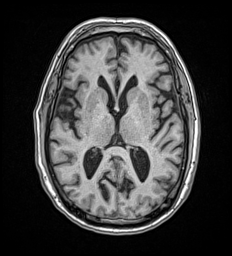
[im 105/175]
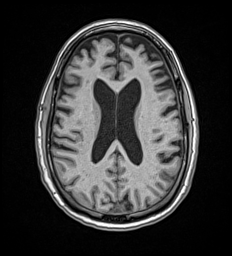
[im 122/175]
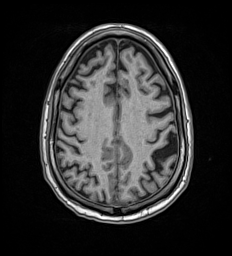
[im 140/175]
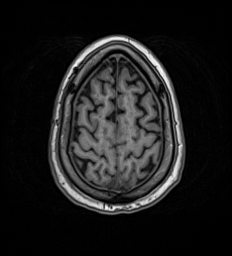
[im 157/175]
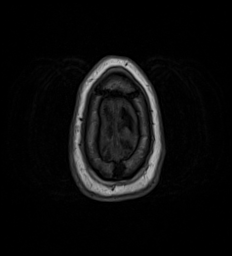
[im 175/175]
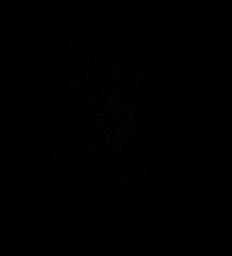

[Series 17: T2 · coronal · B · 5.0mm · 0.86mm/px · 2 of 30 slices shown (2 of 2)]
[im 1/30]
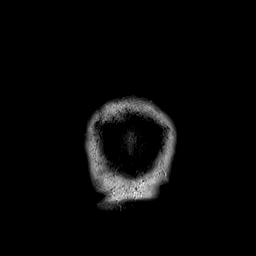
[im 30/30]
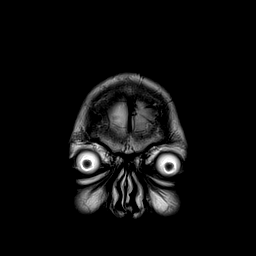

[48 of 48 positions shown; findings below may reference images not displayed]

FINDINGS: MRI HEAD FINDINGS

Brain: No acute infarction, hemorrhage, hydrocephalus, extra-axial
collection or mass lesion. Mild white matter changes with scattered
small deep white matter hyperintensities bilaterally. Brainstem and
cerebellum normal.

Vascular: Normal arterial flow voids.

Skull and upper cervical spine: No focal skeletal abnormality.

Sinuses/Orbits: Cyst right maxillary sinus. Remaining sinuses clear.
Negative orbit

Other: None

MRA HEAD FINDINGS

Both vertebral arteries patent to the basilar. PICA peers patent
bilaterally although incompletely visualized. Basilar widely patent.
Superior cerebellar arteries patent bilaterally. Fetal origin of the
posterior cerebral artery bilaterally without stenosis

Internal carotid artery widely patent bilaterally. Anterior and
middle cerebral arteries patent without stenosis. Negative for
cerebral aneurysm.

MRA NECK FINDINGS

Antegrade flow in the carotid and vertebral arteries bilaterally.
Carotid bifurcation widely patent. Both vertebral arteries widely
patent. No significant stenosis in the neck.
IMPRESSION: 1. No acute intracranial abnormality. Mild white matter changes
likely due to microvascular ischemia.
2. Negative MRA head
3. Negative MRA neck.

## 2021-03-29 IMAGING — CT CT HEAD W/O CM
4 series · 17 of 47 positions shown, 19 images · non-contrast
Comparison: None.

CLINICAL DATA: Dizziness.

EXAM:
CT HEAD WITHOUT CONTRAST
TECHNIQUE: Contiguous axial images were obtained from the base of the skull
through the vertex without intravenous contrast.

[Series 2: head wo · axial · 0.46mm/px · z∈[-137,-12]mm · 7 of 35 slices shown, 9 images]
[im 5/35  brain]
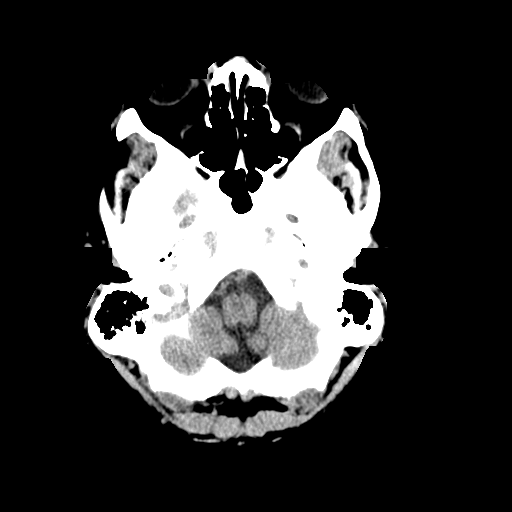
[im 5/35  bone]
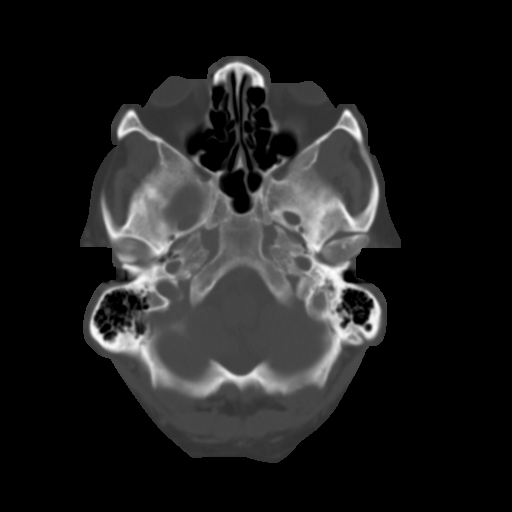
[im 9/35  brain]
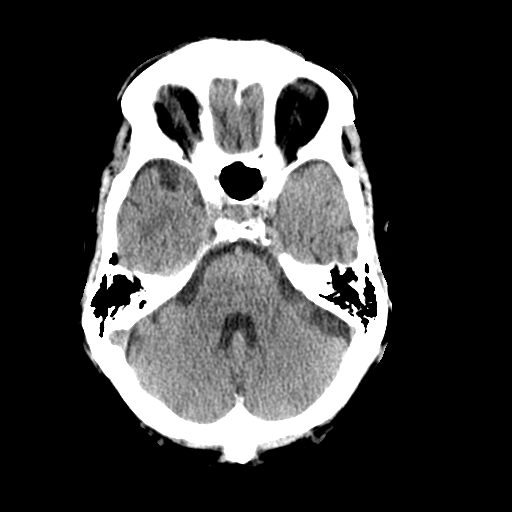
[im 13/35  brain]
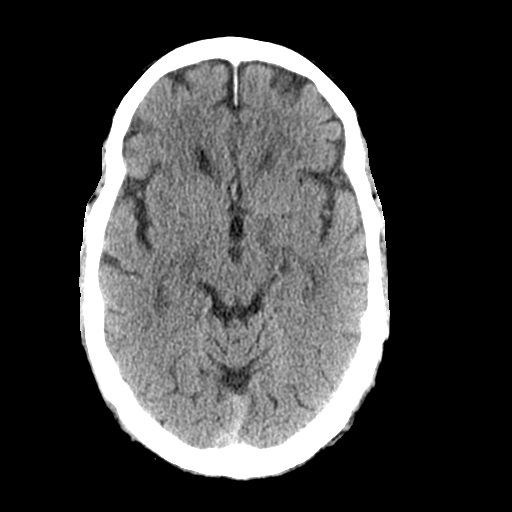
[im 18/35  brain]
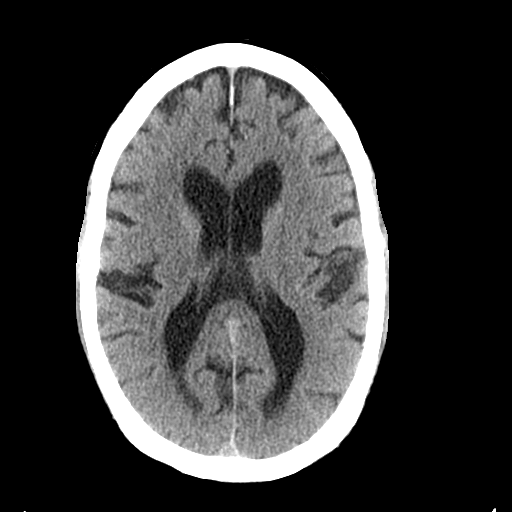
[im 22/35  brain]
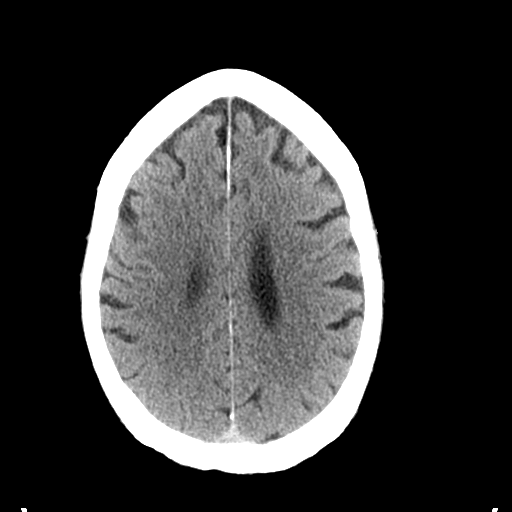
[im 22/35  bone]
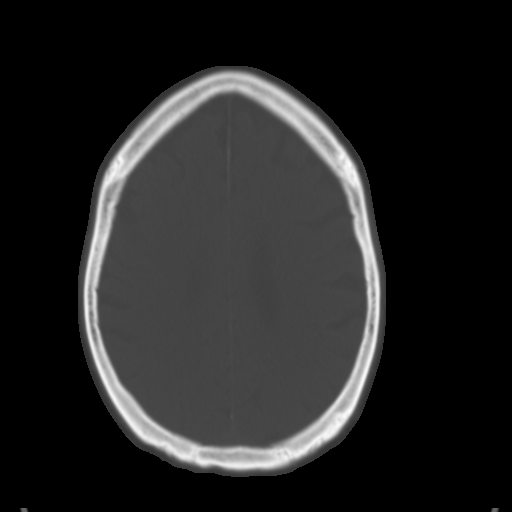
[im 26/35  brain]
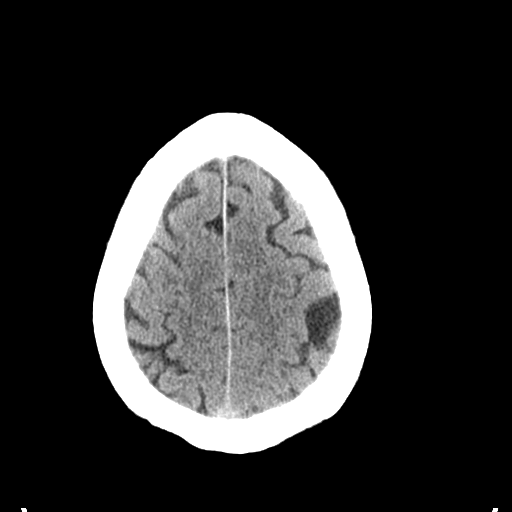
[im 30/35  brain]
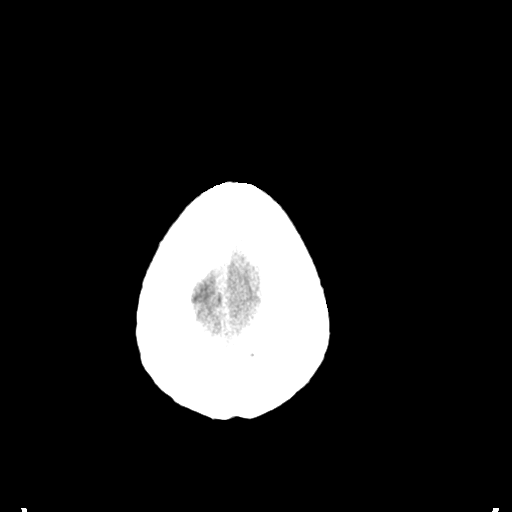

[Series 3: head bone · axial · 0.46mm/px · z∈[-141,-79]mm · 4 of 88 slices shown]
[im 9/88  bone]
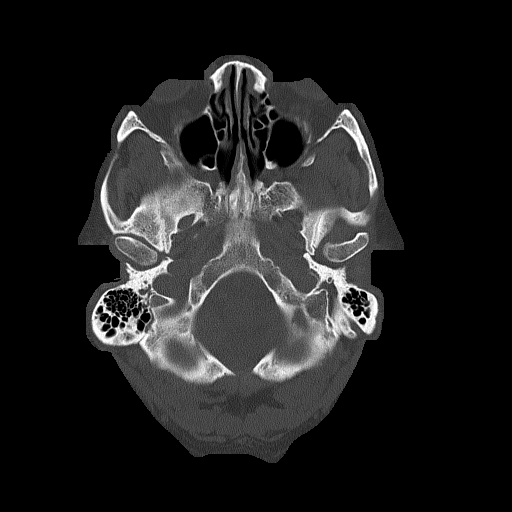
[im 18/88  bone]
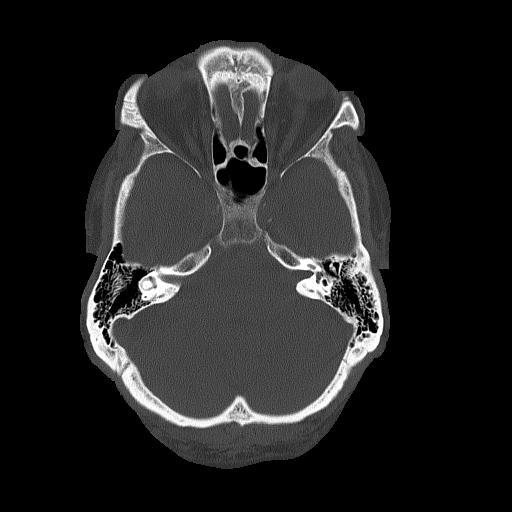
[im 27/88  bone]
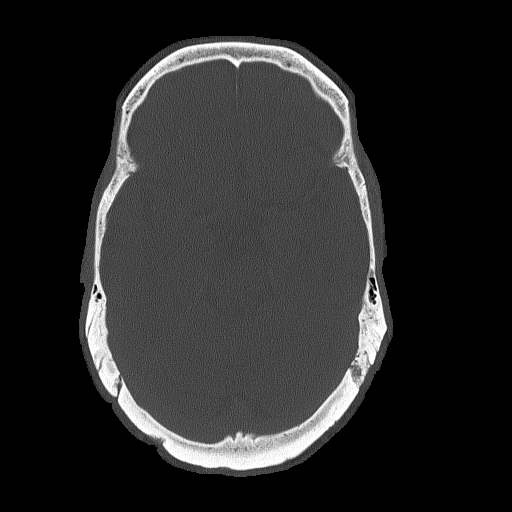
[im 40/88  bone]
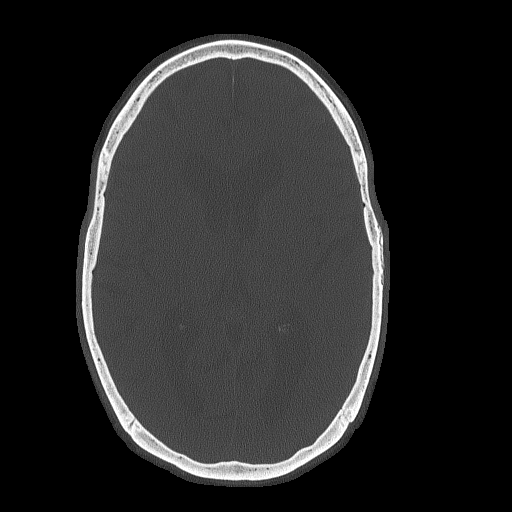

[Series 4: coronal soft tissue · coronal · 0.37mm/px · 3 of 76 slices shown]
[im 26/76  brain]
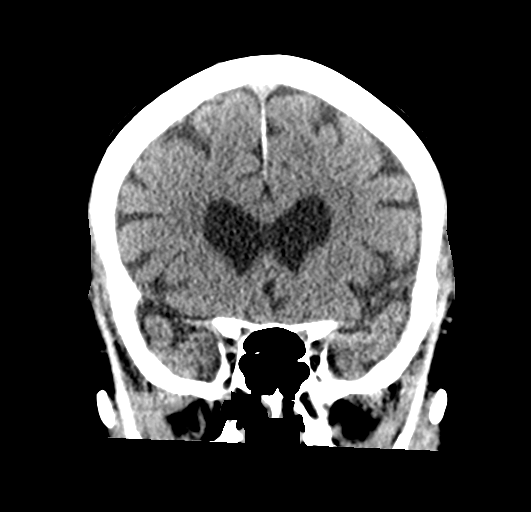
[im 34/76  brain]
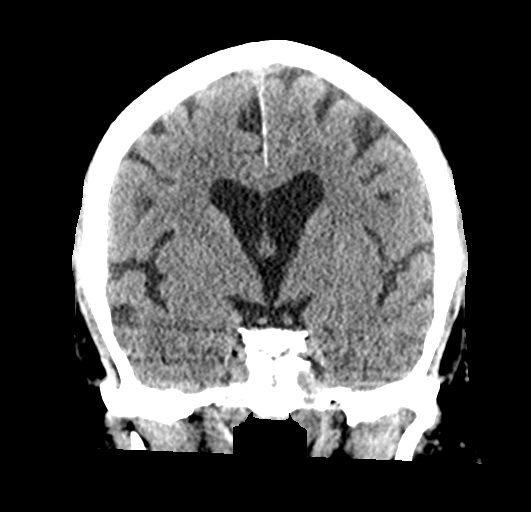
[im 42/76  brain]
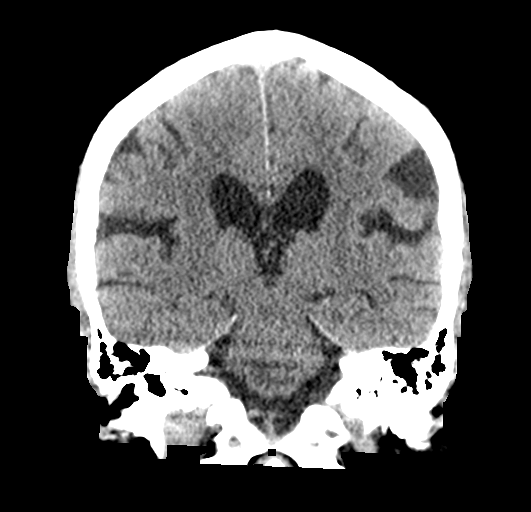

[Series 5: sagittal soft tissue · sagittal · 0.35mm/px · 3 of 63 slices shown]
[im 21/63  brain]
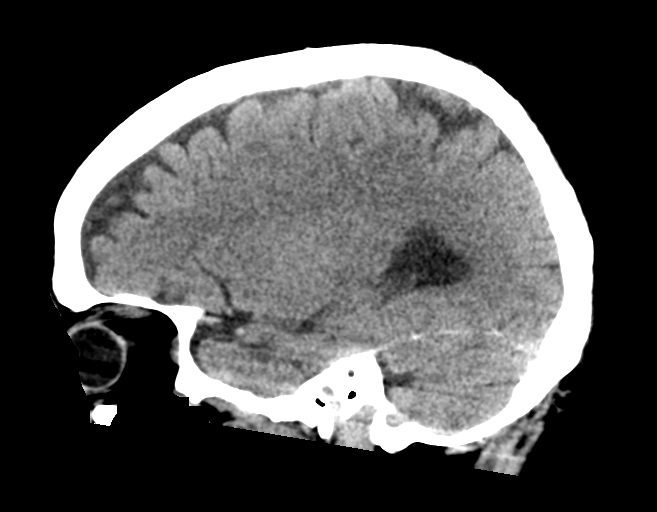
[im 32/63  brain]
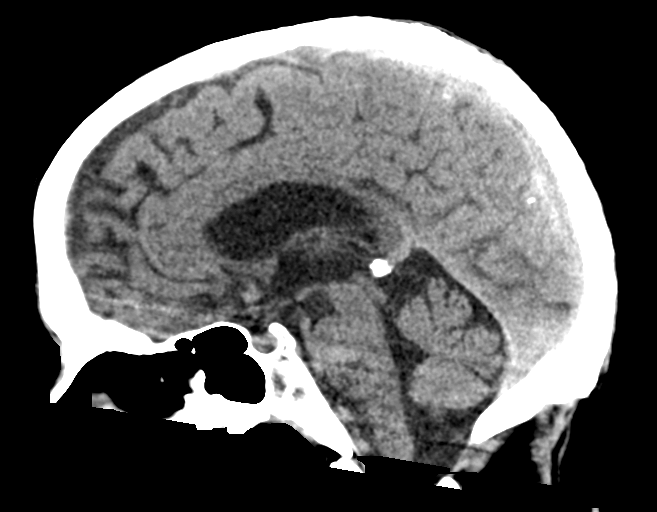
[im 42/63  brain]
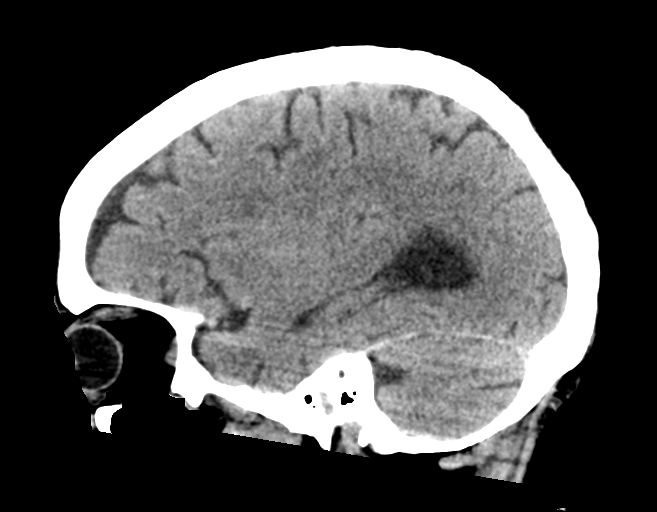

[17 of 47 positions shown; findings below may reference images not displayed]

FINDINGS: Brain: No evidence of acute infarction, hemorrhage, hydrocephalus,
extra-axial collection or mass lesion/mass effect. Mild generalized
cerebral atrophy.

Vascular: Calcified atherosclerosis at the skullbase. No hyperdense
vessel.

Skull: Normal. Negative for fracture or focal lesion.

Sinuses/Orbits: No acute finding.

Other: None.
IMPRESSION: 1. No acute intracranial abnormality.

## 2021-03-29 IMAGING — MR MR MRA NECK W/O CM
1 series · 39 of 48 positions shown · non-contrast
Comparison: CT head [DATE]

CLINICAL DATA: Dizziness



[Series 6: TOF · axial · B · 0.6mm · 0.52mm/px · z∈[-180,-82]mm · 39 of 172 slices shown]
[im 1/172]
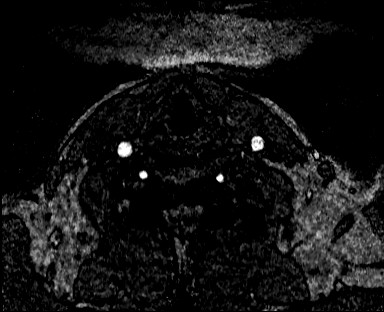
[im 4/172]
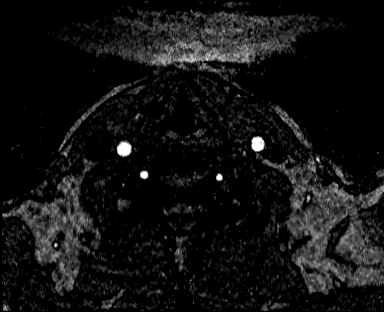
[im 8/172]
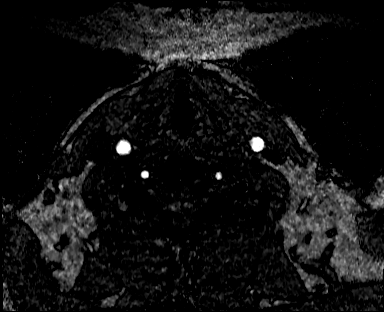
[im 11/172]
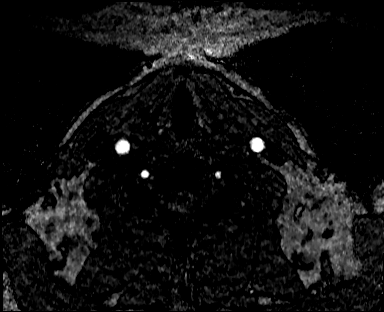
[im 15/172]
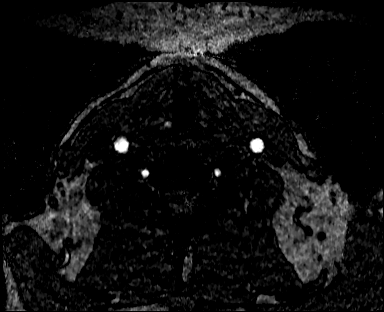
[im 19/172]
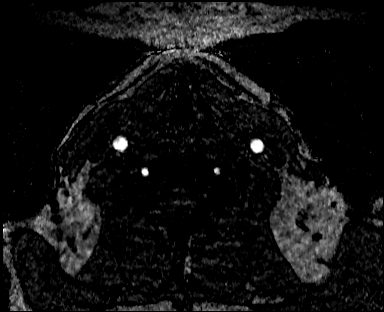
[im 22/172]
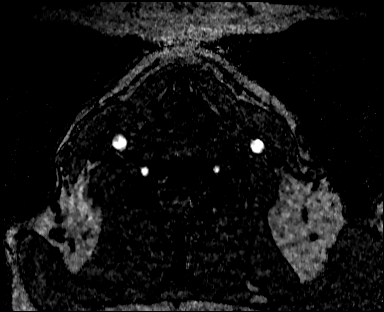
[im 26/172]
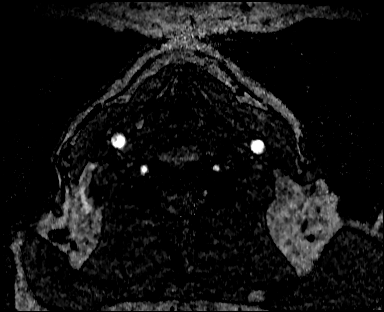
[im 30/172]
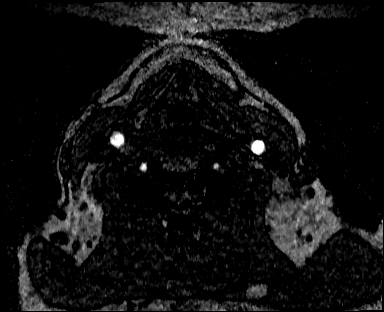
[im 33/172]
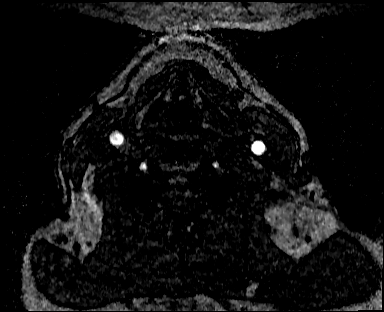
[im 37/172]
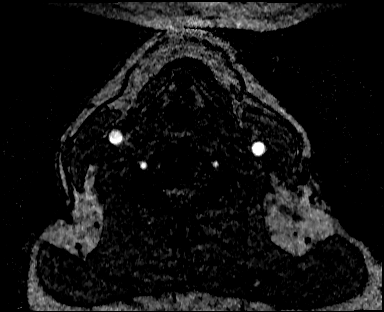
[im 41/172]
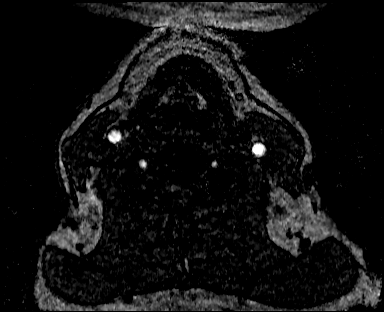
[im 44/172]
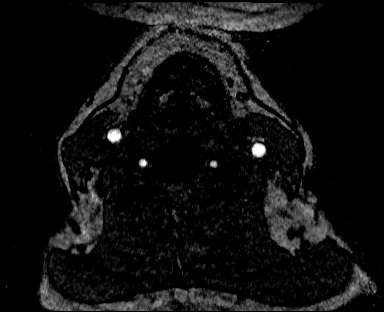
[im 48/172]
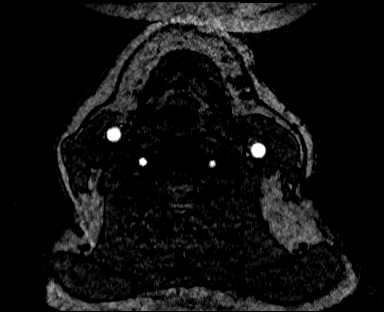
[im 51/172]
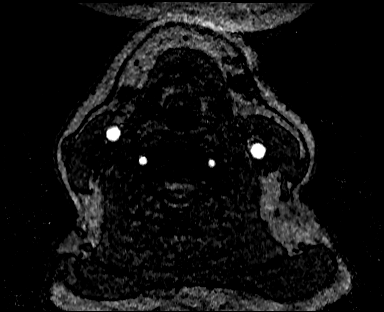
[im 55/172]
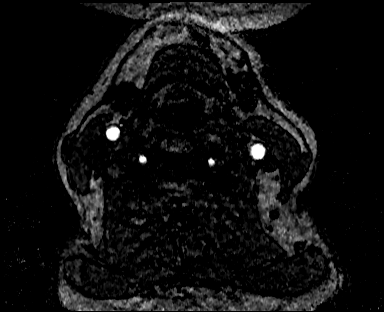
[im 59/172]
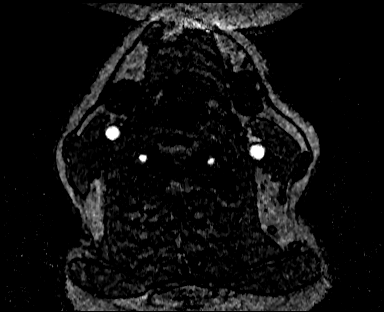
[im 62/172]
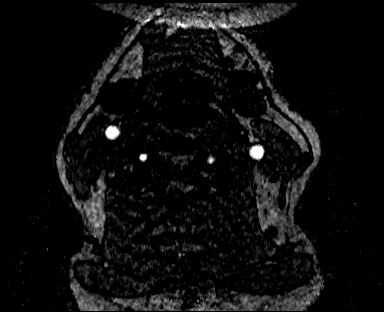
[im 66/172]
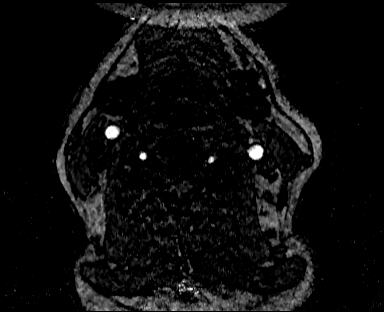
[im 70/172]
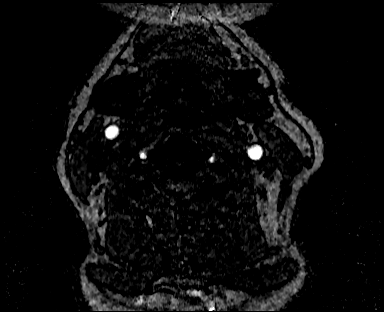
[im 73/172]
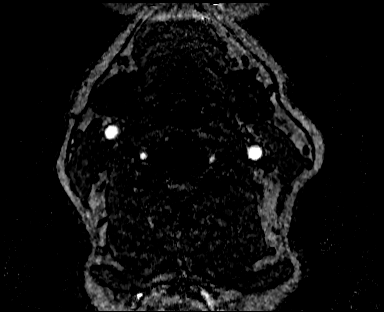
[im 77/172]
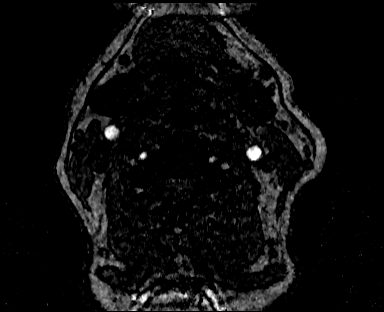
[im 81/172]
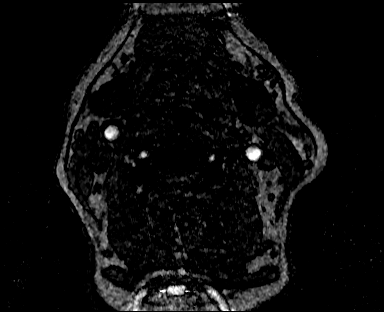
[im 84/172]
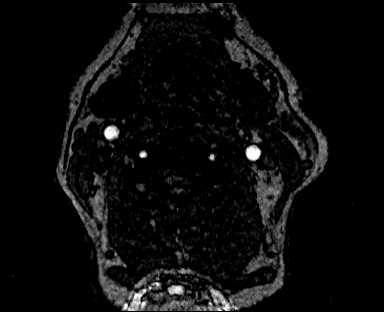
[im 88/172]
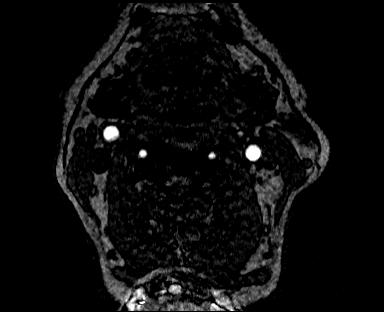
[im 91/172]
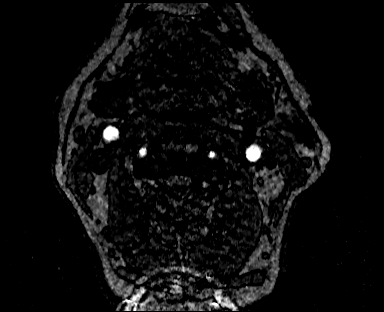
[im 95/172]
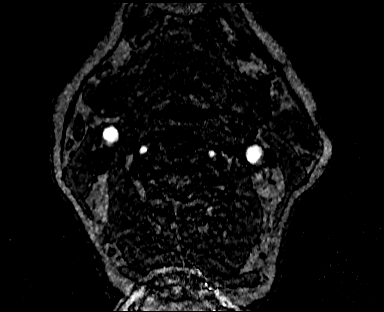
[im 99/172]
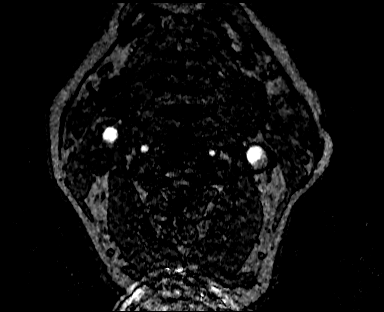
[im 102/172]
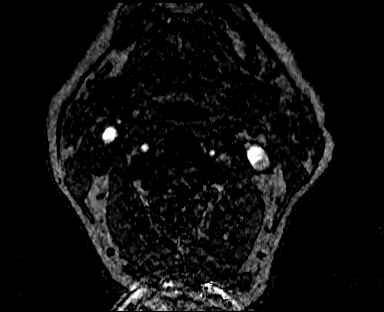
[im 106/172]
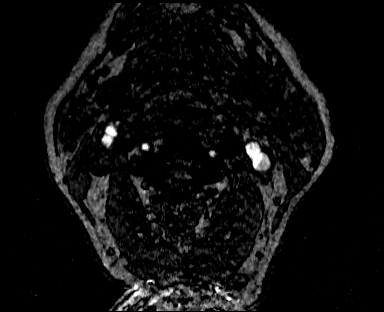
[im 110/172]
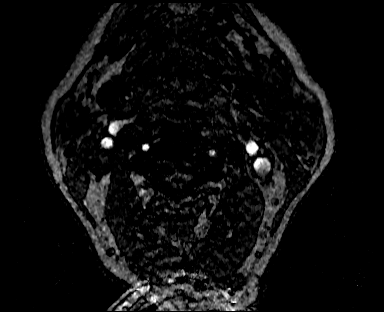
[im 113/172]
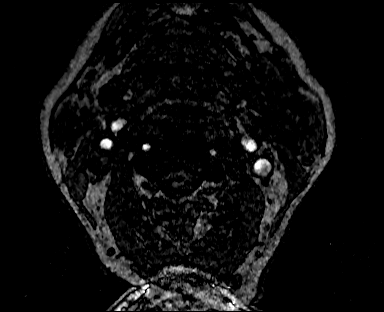
[im 117/172]
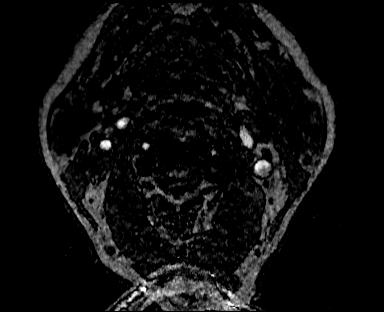
[im 121/172]
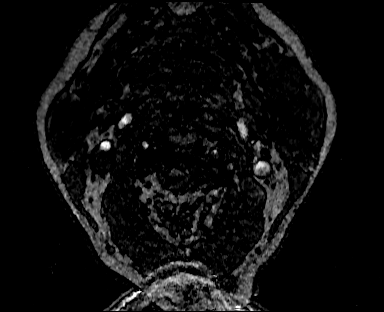
[im 124/172]
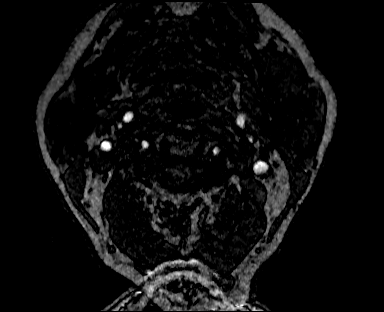
[im 128/172]
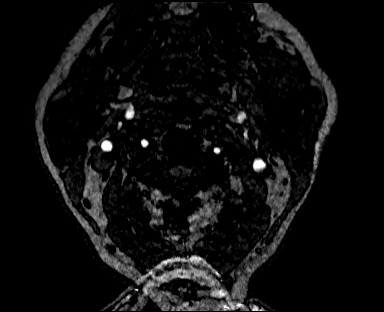
[im 142/172]
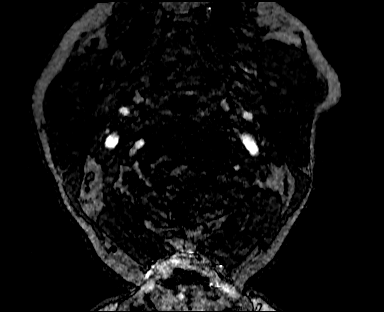
[im 146/172]
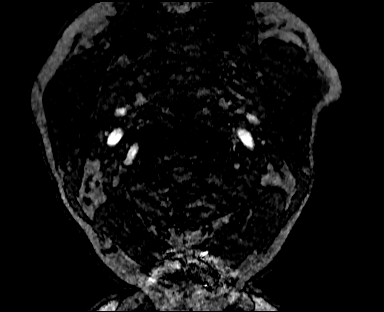
[im 164/172]
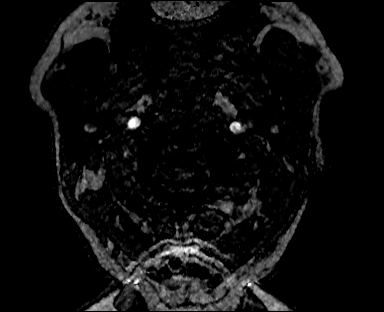

[39 of 48 positions shown; findings below may reference images not displayed]

FINDINGS: MRI HEAD FINDINGS

Brain: No acute infarction, hemorrhage, hydrocephalus, extra-axial
collection or mass lesion. Mild white matter changes with scattered
small deep white matter hyperintensities bilaterally. Brainstem and
cerebellum normal.

Vascular: Normal arterial flow voids.

Skull and upper cervical spine: No focal skeletal abnormality.

Sinuses/Orbits: Cyst right maxillary sinus. Remaining sinuses clear.
Negative orbit

Other: None

MRA HEAD FINDINGS

Both vertebral arteries patent to the basilar. PICA peers patent
bilaterally although incompletely visualized. Basilar widely patent.
Superior cerebellar arteries patent bilaterally. Fetal origin of the
posterior cerebral artery bilaterally without stenosis

Internal carotid artery widely patent bilaterally. Anterior and
middle cerebral arteries patent without stenosis. Negative for
cerebral aneurysm.

MRA NECK FINDINGS

Antegrade flow in the carotid and vertebral arteries bilaterally.
Carotid bifurcation widely patent. Both vertebral arteries widely
patent. No significant stenosis in the neck.
IMPRESSION: 1. No acute intracranial abnormality. Mild white matter changes
likely due to microvascular ischemia.
2. Negative MRA head
3. Negative MRA neck.

## 2021-03-29 IMAGING — MR MR MRA HEAD W/O CM
1 series · 19 of 48 positions shown · non-contrast
Comparison: CT head [DATE]

CLINICAL DATA: Dizziness



[Series 1: TOF · axial · B · 0.5mm · 0.41mm/px · z∈[-50,+48]mm · 19 of 205 slices shown]
[im 1/205]
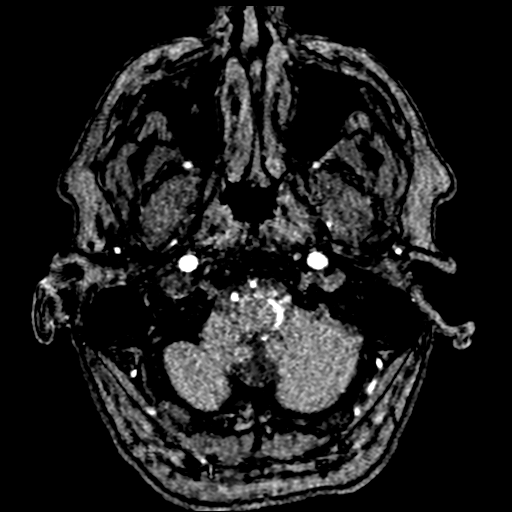
[im 5/205]
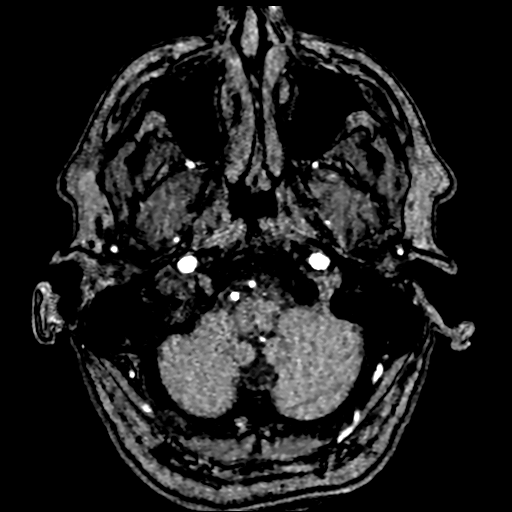
[im 9/205]
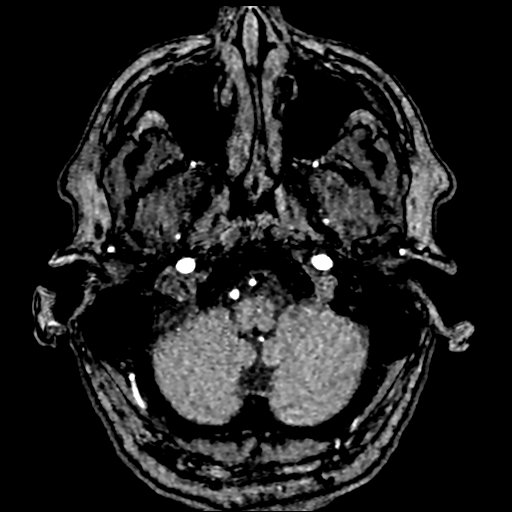
[im 14/205]
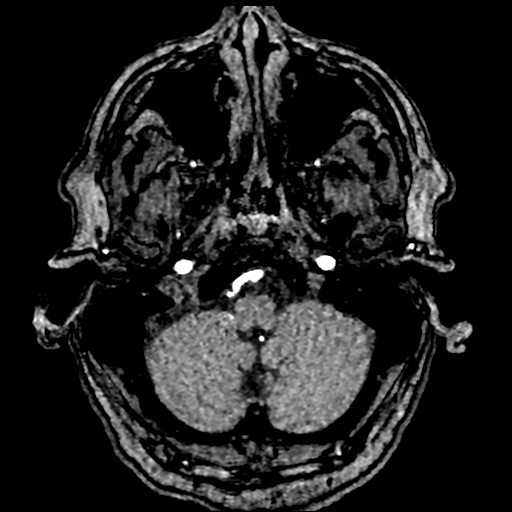
[im 18/205]
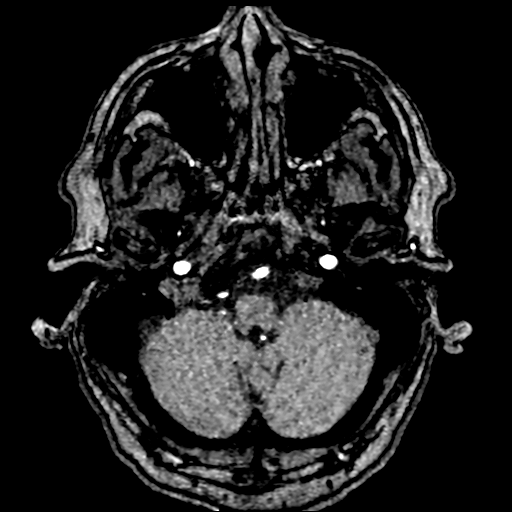
[im 22/205]
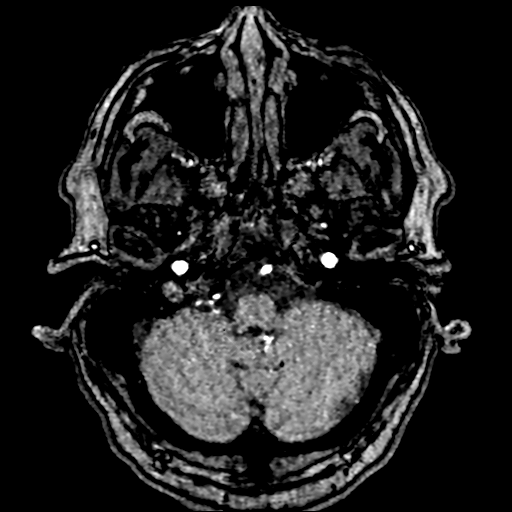
[im 27/205]
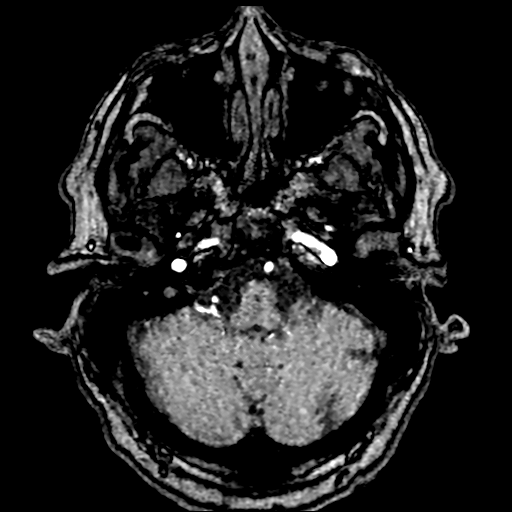
[im 31/205]
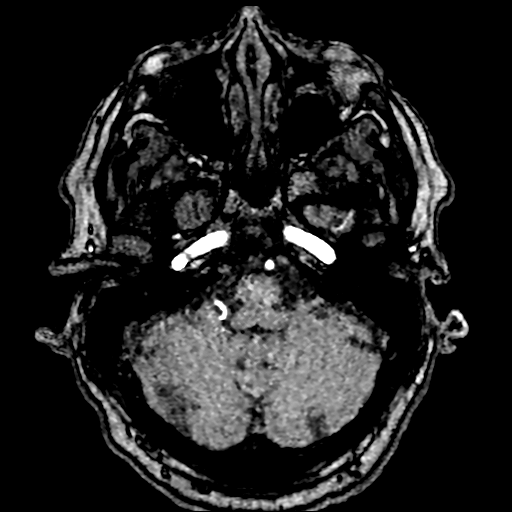
[im 35/205]
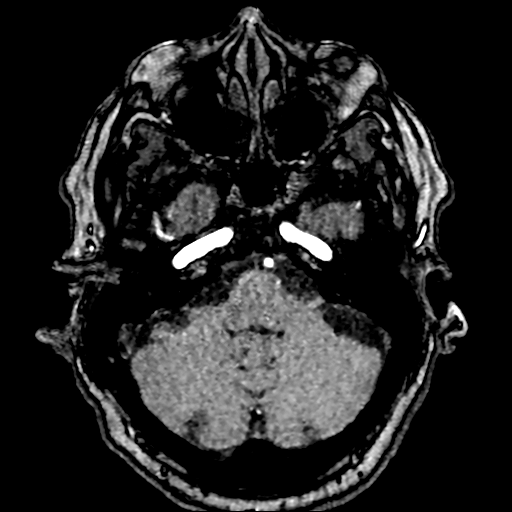
[im 40/205]
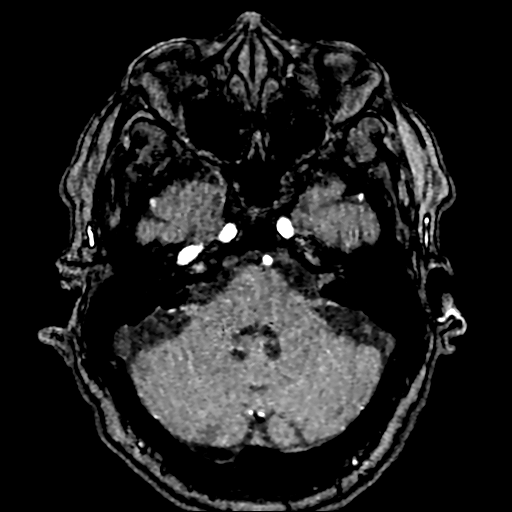
[im 44/205]
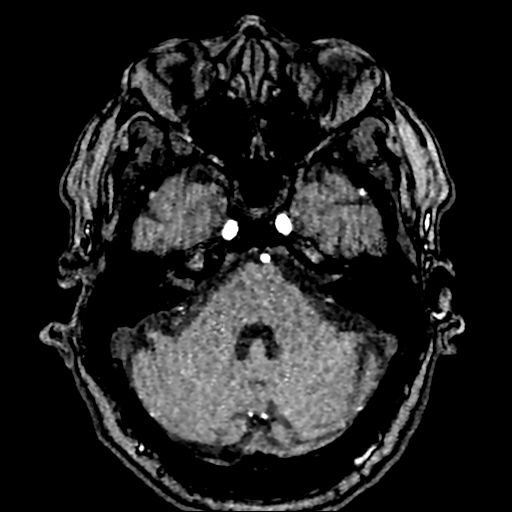
[im 66/205]
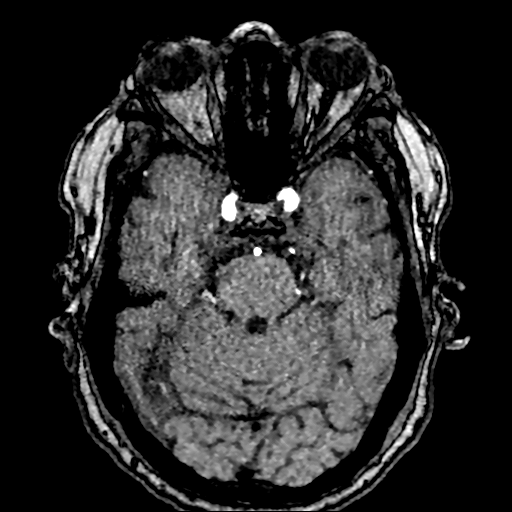
[im 92/205]
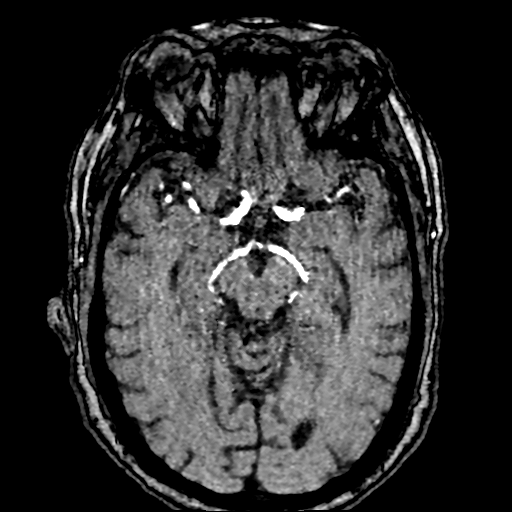
[im 105/205]
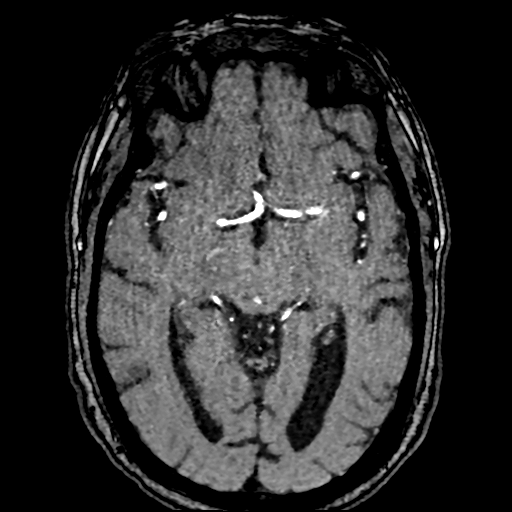
[im 118/205]
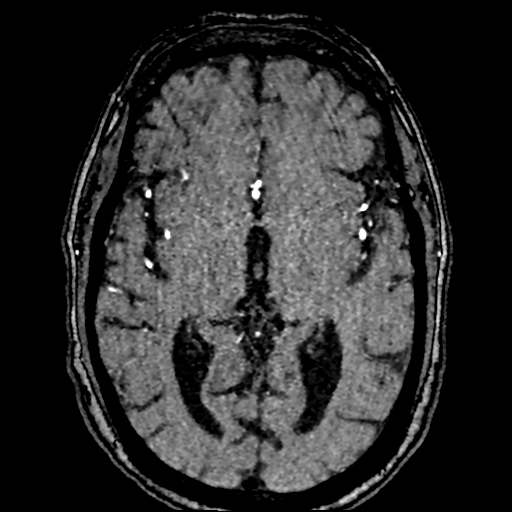
[im 144/205]
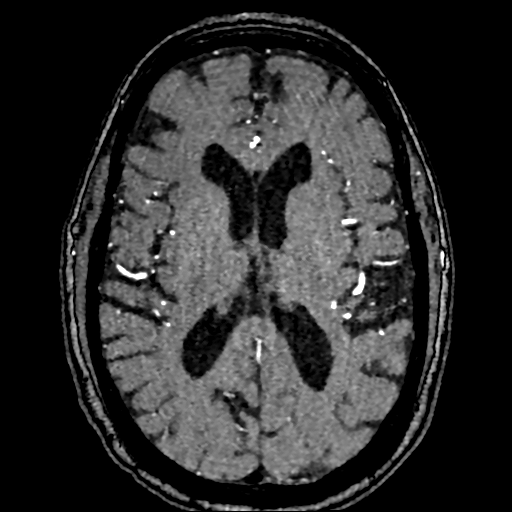
[im 170/205]
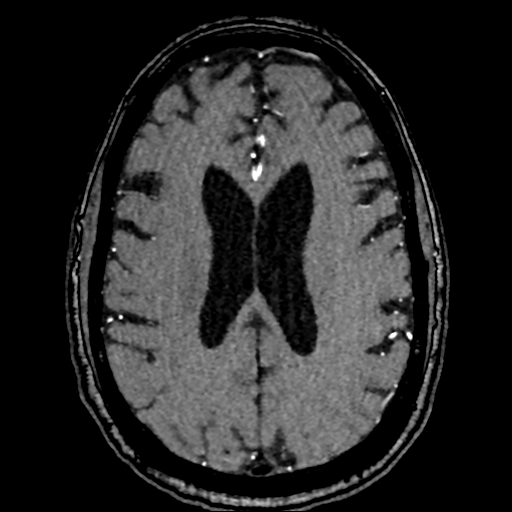
[im 174/205]
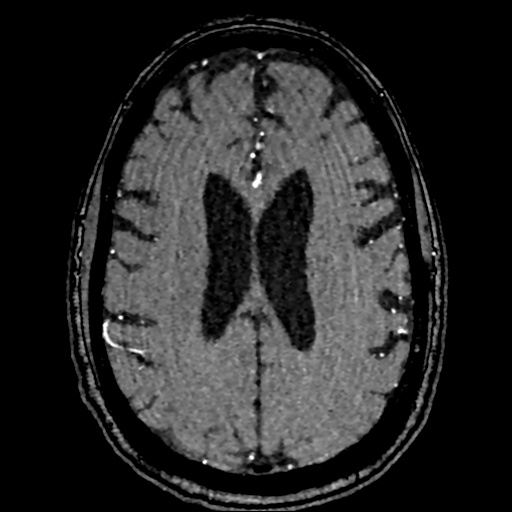
[im 196/205]
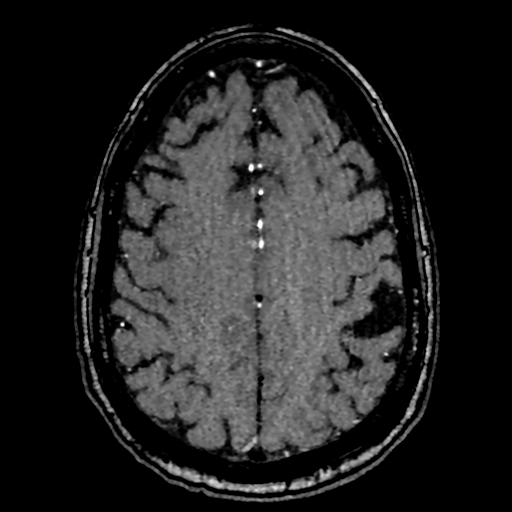

[19 of 48 positions shown; findings below may reference images not displayed]

FINDINGS: MRI HEAD FINDINGS

Brain: No acute infarction, hemorrhage, hydrocephalus, extra-axial
collection or mass lesion. Mild white matter changes with scattered
small deep white matter hyperintensities bilaterally. Brainstem and
cerebellum normal.

Vascular: Normal arterial flow voids.

Skull and upper cervical spine: No focal skeletal abnormality.

Sinuses/Orbits: Cyst right maxillary sinus. Remaining sinuses clear.
Negative orbit

Other: None

MRA HEAD FINDINGS

Both vertebral arteries patent to the basilar. PICA peers patent
bilaterally although incompletely visualized. Basilar widely patent.
Superior cerebellar arteries patent bilaterally. Fetal origin of the
posterior cerebral artery bilaterally without stenosis

Internal carotid artery widely patent bilaterally. Anterior and
middle cerebral arteries patent without stenosis. Negative for
cerebral aneurysm.

MRA NECK FINDINGS

Antegrade flow in the carotid and vertebral arteries bilaterally.
Carotid bifurcation widely patent. Both vertebral arteries widely
patent. No significant stenosis in the neck.
IMPRESSION: 1. No acute intracranial abnormality. Mild white matter changes
likely due to microvascular ischemia.
2. Negative MRA head
3. Negative MRA neck.

## 2021-03-29 NOTE — ED Provider Notes (Signed)
[facility] Emergency Department Provider Note  ____________________________________________  Time seen: Approximately 5:18 PM  I have reviewed the triage vital signs and the nursing notes.   HISTORY  Chief Complaint Dizziness    HPI KHANI PAINO is a 65 y.o. male with a history of CAD hypertension hyperlipidemia and diabetes who comes the ED complaining of a feeling of dizziness and being "out of sorts" since about 11:30 AM today.  Constant, waxing and waning, associated with increase of chronic tinnitus which is nonpulsatile.  No chest pain shortness of breath or palpitations.  Denies vision changes paresthesia or weakness.  He does complain of a strange sensation in his head.  Denies abrupt/thunderclap headache.  Reports that the dizziness does not feel vertiginous like spinning or motion.   Denies lightheadedness but did note that it was worse after standing up from a seated position.  No recent falls or trauma.  No recent illness.     Past Medical History:  Diagnosis Date  . CAD in native artery    a. anterolateral STEMI 07/2017 s/p DES to prox-mid LAD, EF 55%.  Marland Kitchen GERD (gastroesophageal reflux disease)   . Hyperlipidemia   . Hypertension   . Overactive bladder   . Renal calculi   . Uncontrolled diabetes mellitus [facility])      Patient Active Problem List   Diagnosis Date Noted  . Abnormal nuclear stress test   . CAD in native artery 08/06/2017  . Essential hypertension 08/06/2017  . Hypokalemia 08/06/2017  . ST elevation myocardial infarction involving left anterior descending (LAD) coronary artery (HCC)   . Type 2 diabetes mellitus with complication, without long-term current use of insulin (HCC)   . Hyperlipidemia LDL goal <70   . Hyperlipidemia associated with type 2 diabetes mellitus (HCC) 07/29/2014  . Hypertensive kidney disease with CKD stage III (HCC) 07/29/2014  . UI (urinary incontinence) 07/29/2014     Past Surgical History:   Procedure Laterality Date  . CORONARY/GRAFT ACUTE MI REVASCULARIZATION N/A 08/04/2017   Procedure: Coronary/Graft Acute MI Revascularization;  Surgeon: Lennette Bihari, MD;  Location: [facility] INVASIVE CV LAB;  Service: Cardiovascular;  Laterality: N/A;  . LEFT HEART CATH AND CORONARY ANGIOGRAPHY N/A 08/04/2017   Procedure: LEFT HEART CATH AND CORONARY ANGIOGRAPHY;  Surgeon: Lennette Bihari, MD;  Location: [facility] INVASIVE CV LAB;  Service: Cardiovascular;  Laterality: N/A;  . LEFT HEART CATH AND CORONARY ANGIOGRAPHY N/A 03/21/2018   Procedure: LEFT HEART CATH AND CORONARY ANGIOGRAPHY;  Surgeon: Lennette Bihari, MD;  Location: [facility] INVASIVE CV LAB;  Service: Cardiovascular;  Laterality: N/A;  . PROSTATE SURGERY       Prior to Admission medications   Medication Sig Start Date End Date Taking? Authorizing Provider  AMBULATORY NON FORMULARY MEDICATION Trimix (30/2/20)-(Pap/Phent/PGE)  Dose  Inject 0.7 mcg per injection  Vial 61ml  Qty #3 Refills 0  [facility] [phone] Fax [phone] 03/19/21   Michiel Cowboy A, PA-C  amLODipine (NORVASC) 10 MG tablet TAKE 1 TABLET(10 MG) BY MOUTH DAILY 11/15/20   Lennette Bihari, MD  aspirin (ASPIRIN LOW DOSE) 81 MG EC tablet Take 1 tablet (81 mg total) by mouth daily. 11/01/18   Lennette Bihari, MD  atorvastatin (LIPITOR) 40 MG tablet TAKE 1 TABLET(40 MG) BY MOUTH EVERY EVENING 01/03/19   Lennette Bihari, MD  BRILINTA 60 MG TABS tablet TAKE 1 TABLET BY MOUTH TWICE DAILY 11/15/20   Lennette Bihari, MD  Coenzyme Q10 200 MG capsule Take  200 mg by mouth daily.    [provider]  glimepiride (AMARYL) 2 MG tablet Take 2 mg by mouth daily with breakfast.    [provider]  Glucosamine-Chondroit-Vit C-Mn (GLUCOSAMINE 1500 COMPLEX PO) Take 1,500 mg by mouth daily.    [provider]  isosorbide mononitrate (IMDUR) 60 MG 24 hr tablet Take 1.5 tablets (90 mg total) by mouth daily. 09/12/20 12/11/20  Lennette Bihari, MD  losartan  (COZAAR) 100 MG tablet TAKE 1 TABLET(100 MG) BY MOUTH DAILY 05/17/20   Lennette Bihari, MD  Magnesium 250 MG TABS Take 250 mg by mouth daily.    [provider]  Magnesium Citrate 100 MG TABS Take 100 mg by mouth daily.    [provider]  metFORMIN (GLUCOPHAGE-XR) 500 MG 24 hr tablet Take 1,000 mg by mouth 2 (two) times daily. 02/25/21   [provider]  metoprolol tartrate (LOPRESSOR) 50 MG tablet TAKE 1 TABLET(50 MG) BY MOUTH TWICE DAILY 05/07/20   Lennette Bihari, MD  mirabegron ER (MYRBETRIQ) 25 MG TB24 tablet TAKE 1 TABLET(25 MG) BY MOUTH DAILY 10/15/20   McGowan, Carollee Herter A, PA-C  Multiple Vitamins-Minerals (ABC PLUS SENIOR PO) Take 1 tablet by mouth daily.    [provider]  nitroGLYCERIN (NITROSTAT) 0.4 MG SL tablet Place 1 tablet (0.4 mg total) under the tongue every 5 (five) minutes x 3 doses as needed for chest pain. 10/11/19   Lennette Bihari, MD  Omega-3 Fatty Acids (FISH OIL OMEGA-3 PO) Take 2 capsules by mouth 2 (two) times daily.     [provider]  pantoprazole (PROTONIX) 40 MG tablet Take 40 mg by mouth 2 (two) times daily.    [provider]  Potassium 99 MG TABS Take 99 mg by mouth daily.    [provider]  pyridOXINE (VITAMIN B-6) 50 MG tablet Take 50 mg by mouth daily.    [provider]  ranolazine (RANEXA) 1000 MG SR tablet TAKE 1 TABLET(1000 MG) BY MOUTH TWICE DAILY 03/05/21   Lennette Bihari, MD  tamsulosin (FLOMAX) 0.4 MG CAPS capsule Take 1 capsule (0.4 mg total) by mouth daily. 10/15/20   Michiel Cowboy A, PA-C  vitamin C (ASCORBIC ACID) 500 MG tablet Take 500 mg by mouth daily.    [provider]  vitamin E 400 UNIT capsule Take 400 Units by mouth daily.    [provider]     Allergies Patient has no known allergies.   Family History  Problem Relation Age of Onset  . Heart disease Father   . Heart attack Father     Social History Social History   Tobacco Use  .  Smoking status: Never Smoker  . Smokeless tobacco: Never Used  Substance Use Topics  . Alcohol use: No  . Drug use: No    Review of Systems  Constitutional:   No fever or chills.  ENT:   No sore throat. No rhinorrhea. Cardiovascular:   No chest pain or syncope. Respiratory:   No dyspnea or cough. Gastrointestinal:   Negative for abdominal pain, vomiting and diarrhea.  Musculoskeletal:   Negative for focal pain or swelling All other systems reviewed and are negative except as documented above in ROS and HPI.  ____________________________________________   PHYSICAL EXAM:  VITAL SIGNS: ED Triage Vitals  Enc Vitals Group     BP 03/29/21 1515 139/76     Pulse Rate 03/29/21 1515 76     Resp 03/29/21 1515 20  Temp 03/29/21 1515 97.7 F (36.5 C)     Temp Source 03/29/21 1515 Oral     SpO2 03/29/21 1515 97 %     Weight 03/29/21 1515 203 lb (92.1 kg)     Height 03/29/21 1515 6' (1.829 m)     Head Circumference --      Peak Flow --      Pain Score 03/29/21 1519 0     Pain Loc --      Pain Edu? --      Excl. in GC? --     Vital signs reviewed, nursing assessments reviewed.   Constitutional:   Alert and oriented. Non-toxic appearance. Eyes:   Conjunctivae are normal. EOMI. PERRL. ENT      Head:   Normocephalic and atraumatic.      Nose: Normal      Mouth/Throat: Normal, moist mucosa      Neck:   No meningismus. Full ROM. Hematological/Lymphatic/Immunilogical:   No cervical lymphadenopathy. Cardiovascular:   RRR. Symmetric bilateral radial and DP pulses.  No murmurs. Cap refill less than 2 seconds. Respiratory:   Normal respiratory effort without tachypnea/retractions. Breath sounds are clear and equal bilaterally. No wheezes/rales/rhonchi. Gastrointestinal:   Soft and nontender. Non distended. There is no CVA tenderness.  No rebound, rigidity, or guarding.  Musculoskeletal:   Normal range of motion in all extremities. No joint effusions.  No lower extremity tenderness.   No edema. Neurologic:   Normal speech and language.  Motor grossly intact. Cerebellar function normal. Steady gait with very slight deviation to the right No acute focal neurologic deficits are appreciated.  Skin:    Skin is warm, dry and intact. No rash noted.  No petechiae, purpura, or bullae.  ____________________________________________    LABS (pertinent positives/negatives) (all labs ordered are listed, but only abnormal results are displayed) Labs Reviewed  BASIC METABOLIC PANEL - Abnormal; Notable for the following components:      Result Value   Glucose, Bld 163 (*)    BUN 32 (*)    All other components within normal limits  CBC  URINALYSIS, COMPLETE (UACMP) WITH MICROSCOPIC  CBG MONITORING, ED   ____________________________________________   EKG  Interpreted by me Sinus rhythm rate of 68, normal axis, first-degree AV block.  Right bundle branch block.  Normal ST segments and T waves, no ischemic changes  ____________________________________________    RADIOLOGY  CT HEAD WO CONTRAST  Result Date: 03/29/2021 CLINICAL DATA:  Dizziness. EXAM: CT HEAD WITHOUT CONTRAST TECHNIQUE: Contiguous axial images were obtained from the base of the skull through the vertex without intravenous contrast. COMPARISON:  None. FINDINGS: Brain: No evidence of acute infarction, hemorrhage, hydrocephalus, extra-axial collection or mass lesion/mass effect. Mild generalized cerebral atrophy. Vascular: Calcified atherosclerosis at the skullbase. No hyperdense vessel. Skull: Normal. Negative for fracture or focal lesion. Sinuses/Orbits: No acute finding. Other: None. IMPRESSION: 1. No acute intracranial abnormality. Electronically Signed   By: Obie DredgeWilliam T Derry M.D.   On: 03/29/2021 16:39   MR ANGIO HEAD WO CONTRAST  Result Date: 03/29/2021 CLINICAL DATA:  Dizziness EXAM: MRI HEAD WITHOUT CONTRAST MRA HEAD WITHOUT CONTRAST MRA NECK WITHOUT CONTRAST TECHNIQUE: Multiplanar, multiecho pulse sequences  of the brain and surrounding structures were obtained without intravenous contrast. Angiographic images of the Circle of Willis were obtained using MRA technique without intravenous contrast. Angiographic images of the neck were obtained using MRA technique without intravenous contrast. Carotid stenosis measurements (when applicable) are obtained utilizing NASCET criteria, using the distal internal carotid diameter  as the denominator. COMPARISON:  CT head 03/29/2021 FINDINGS: MRI HEAD FINDINGS Brain: No acute infarction, hemorrhage, hydrocephalus, extra-axial collection or mass lesion. Mild white matter changes with scattered small deep white matter hyperintensities bilaterally. Brainstem and cerebellum normal. Vascular: Normal arterial flow voids. Skull and upper cervical spine: No focal skeletal abnormality. Sinuses/Orbits: Cyst right maxillary sinus. Remaining sinuses clear. Negative orbit Other: None MRA HEAD FINDINGS Both vertebral arteries patent to the basilar. PICA peers patent bilaterally although incompletely visualized. Basilar widely patent. Superior cerebellar arteries patent bilaterally. Fetal origin of the posterior cerebral artery bilaterally without stenosis Internal carotid artery widely patent bilaterally. Anterior and middle cerebral arteries patent without stenosis. Negative for cerebral aneurysm. MRA NECK FINDINGS Antegrade flow in the carotid and vertebral arteries bilaterally. Carotid bifurcation widely patent. Both vertebral arteries widely patent. No significant stenosis in the neck. IMPRESSION: 1. No acute intracranial abnormality. Mild white matter changes likely due to microvascular ischemia. 2. Negative MRA head 3. Negative MRA neck. Electronically Signed   By: Marlan Palau M.D.   On: 03/29/2021 18:52   MR ANGIO NECK WO CONTRAST  Result Date: 03/29/2021 CLINICAL DATA:  Dizziness EXAM: MRI HEAD WITHOUT CONTRAST MRA HEAD WITHOUT CONTRAST MRA NECK WITHOUT CONTRAST TECHNIQUE:  Multiplanar, multiecho pulse sequences of the brain and surrounding structures were obtained without intravenous contrast. Angiographic images of the Circle of Willis were obtained using MRA technique without intravenous contrast. Angiographic images of the neck were obtained using MRA technique without intravenous contrast. Carotid stenosis measurements (when applicable) are obtained utilizing NASCET criteria, using the distal internal carotid diameter as the denominator. COMPARISON:  CT head 03/29/2021 FINDINGS: MRI HEAD FINDINGS Brain: No acute infarction, hemorrhage, hydrocephalus, extra-axial collection or mass lesion. Mild white matter changes with scattered small deep white matter hyperintensities bilaterally. Brainstem and cerebellum normal. Vascular: Normal arterial flow voids. Skull and upper cervical spine: No focal skeletal abnormality. Sinuses/Orbits: Cyst right maxillary sinus. Remaining sinuses clear. Negative orbit Other: None MRA HEAD FINDINGS Both vertebral arteries patent to the basilar. PICA peers patent bilaterally although incompletely visualized. Basilar widely patent. Superior cerebellar arteries patent bilaterally. Fetal origin of the posterior cerebral artery bilaterally without stenosis Internal carotid artery widely patent bilaterally. Anterior and middle cerebral arteries patent without stenosis. Negative for cerebral aneurysm. MRA NECK FINDINGS Antegrade flow in the carotid and vertebral arteries bilaterally. Carotid bifurcation widely patent. Both vertebral arteries widely patent. No significant stenosis in the neck. IMPRESSION: 1. No acute intracranial abnormality. Mild white matter changes likely due to microvascular ischemia. 2. Negative MRA head 3. Negative MRA neck. Electronically Signed   By: Marlan Palau M.D.   On: 03/29/2021 18:52   MR BRAIN WO CONTRAST  Result Date: 03/29/2021 CLINICAL DATA:  Dizziness EXAM: MRI HEAD WITHOUT CONTRAST MRA HEAD WITHOUT CONTRAST MRA NECK  WITHOUT CONTRAST TECHNIQUE: Multiplanar, multiecho pulse sequences of the brain and surrounding structures were obtained without intravenous contrast. Angiographic images of the Circle of Willis were obtained using MRA technique without intravenous contrast. Angiographic images of the neck were obtained using MRA technique without intravenous contrast. Carotid stenosis measurements (when applicable) are obtained utilizing NASCET criteria, using the distal internal carotid diameter as the denominator. COMPARISON:  CT head 03/29/2021 FINDINGS: MRI HEAD FINDINGS Brain: No acute infarction, hemorrhage, hydrocephalus, extra-axial collection or mass lesion. Mild white matter changes with scattered small deep white matter hyperintensities bilaterally. Brainstem and cerebellum normal. Vascular: Normal arterial flow voids. Skull and upper cervical spine: No focal skeletal abnormality. Sinuses/Orbits: Cyst right maxillary sinus. Remaining  sinuses clear. Negative orbit Other: None MRA HEAD FINDINGS Both vertebral arteries patent to the basilar. PICA peers patent bilaterally although incompletely visualized. Basilar widely patent. Superior cerebellar arteries patent bilaterally. Fetal origin of the posterior cerebral artery bilaterally without stenosis Internal carotid artery widely patent bilaterally. Anterior and middle cerebral arteries patent without stenosis. Negative for cerebral aneurysm. MRA NECK FINDINGS Antegrade flow in the carotid and vertebral arteries bilaterally. Carotid bifurcation widely patent. Both vertebral arteries widely patent. No significant stenosis in the neck. IMPRESSION: 1. No acute intracranial abnormality. Mild white matter changes likely due to microvascular ischemia. 2. Negative MRA head 3. Negative MRA neck. Electronically Signed   By: Marlan Palau M.D.   On: 03/29/2021 18:52     ____________________________________________   PROCEDURES Procedures  ____________________________________________  DIFFERENTIAL DIAGNOSIS   Vertigo, dehydration, stroke, intracranial hemorrhage, intracranial aneurysm or vascular injury  CLINICAL IMPRESSION / ASSESSMENT AND PLAN / ED COURSE  Medications ordered in the ED: Medications - No data to display  Pertinent labs & imaging results that were available during my care of the patient were reviewed by me and considered in my medical decision making (see chart for details).  CHARLY HOLCOMB was evaluated in Emergency Department on 03/29/2021 for the symptoms described in the history of present illness. He was evaluated in the context of the global COVID-19 pandemic, which necessitated consideration that the patient might be at risk for infection with the SARS-CoV-2 virus that causes COVID-19. Institutional protocols and algorithms that pertain to the evaluation of patients at risk for COVID-19 are in a state of rapid change based on information released by regulatory bodies including the CDC and federal and state organizations. These policies and algorithms were followed during the patient's care in the ED.   Patient presents with vague dizziness feeling.  Vital signs normal, nontoxic, neuro exam nonfocal except for very slight alteration in gait.  He does have multiple stroke risk factors.  CT scan is negative for ICH.  Will obtain MRI/MRA brain.  If negative, given otherwise reassuring neurologic exam I think he will be stable for outpatient follow-up.   ----------------------------------------- 7:08 PM on 03/29/2021 ----------------------------------------- MRI/MRA unremarkable.  Stable for discharge.  Vital signs remain normal.      ____________________________________________   FINAL CLINICAL IMPRESSION(S) / ED DIAGNOSES    Final diagnoses:  Dizziness  Dehydration     ED Discharge Orders    None      Portions  of this note were generated with dragon dictation software. Dictation errors may occur despite best attempts at proofreading.   Sharman Cheek, MD 03/29/21 Izell Higden

## 2021-03-29 NOTE — Discharge Instructions (Addendum)
Your lab tests, CT scan of the head, and MRI of the brain were all okay today. Please drink extra water over the next few days to stay well-hydrated and follow up with Dr. Dareen Piano this week.

## 2021-03-29 NOTE — ED Triage Notes (Addendum)
Pt comes pov with bouts of dizziness starting sometime earlier today after lunch (around 1130). Pt states room NOT spinning, and does NOT feel like he's going to pass out but states it feels like when he wakes up from a nap. Pt has LAD stent Aug 2018. Pt denies CP, SOB. Pt is on brilenta.

## 2021-03-29 NOTE — ED Notes (Signed)
Patient transported to MRI 

## 2021-04-17 ENCOUNTER — Other Ambulatory Visit: Payer: Self-pay | Admitting: Cardiovascular Disease

## 2021-04-28 ENCOUNTER — Other Ambulatory Visit: Payer: Self-pay | Admitting: Cardiovascular Disease

## 2021-05-08 ENCOUNTER — Other Ambulatory Visit: Payer: Medicare Other

## 2021-05-08 ENCOUNTER — Other Ambulatory Visit: Payer: Self-pay

## 2021-05-08 DIAGNOSIS — R972 Elevated prostate specific antigen [PSA]: Secondary | ICD-10-CM

## 2021-05-09 LAB — PSA: Prostate Specific Ag, Serum: 3.1 ng/mL (ref 0.0–4.0)

## 2021-06-06 ENCOUNTER — Encounter: Payer: Self-pay | Admitting: Emergency Medicine

## 2021-06-06 ENCOUNTER — Emergency Department: Payer: Medicare Other

## 2021-06-06 ENCOUNTER — Emergency Department
Admission: EM | Admit: 2021-06-06 | Discharge: 2021-06-06 | Disposition: A | Discharge status: Home / Self Care | Payer: Medicare Other | Attending: Student in an Organized Health Care Education/Training Program | Admitting: Student in an Organized Health Care Education/Training Program

## 2021-06-06 DIAGNOSIS — S5001XA Contusion of right elbow, initial encounter: Secondary | ICD-10-CM | POA: Insufficient documentation

## 2021-06-06 DIAGNOSIS — X58XXXA Exposure to other specified factors, initial encounter: Secondary | ICD-10-CM | POA: Insufficient documentation

## 2021-06-06 DIAGNOSIS — Z5321 Procedure and treatment not carried out due to patient leaving prior to being seen by health care provider: Secondary | ICD-10-CM | POA: Insufficient documentation

## 2021-06-06 DIAGNOSIS — R42 Dizziness and giddiness: Secondary | ICD-10-CM | POA: Insufficient documentation

## 2021-06-06 DIAGNOSIS — M79601 Pain in right arm: Secondary | ICD-10-CM | POA: Insufficient documentation

## 2021-06-06 DIAGNOSIS — R55 Syncope and collapse: Secondary | ICD-10-CM | POA: Insufficient documentation

## 2021-06-06 LAB — BASIC METABOLIC PANEL
Anion gap: 6 (ref 5–15)
BUN: 32 mg/dL — ABNORMAL HIGH (ref 8–23)
CO2: 26 mmol/L (ref 22–32)
Calcium: 8.7 mg/dL — ABNORMAL LOW (ref 8.9–10.3)
Chloride: 102 mmol/L (ref 98–111)
Creatinine, Ser: 1.18 mg/dL (ref 0.61–1.24)
GFR, Estimated: >60 mL/min (ref >60)
Glucose, Bld: 121 mg/dL — ABNORMAL HIGH (ref 70–99)
Potassium: 4.1 mmol/L (ref 3.5–5.1)
Sodium: 134 mmol/L — ABNORMAL LOW (ref 135–145)

## 2021-06-06 LAB — URINALYSIS, COMPLETE (UACMP) WITH MICROSCOPIC
Bilirubin Urine: NEGATIVE
Glucose, UA: NEGATIVE mg/dL
Hgb urine dipstick: NEGATIVE
Ketones, ur: NEGATIVE mg/dL
Leukocytes,Ua: NEGATIVE
Nitrite: NEGATIVE
Protein, ur: NEGATIVE mg/dL
Specific Gravity, Urine: 1.023 (ref 1.005–1.030)
pH: 6 (ref 5.0–8.0)

## 2021-06-06 LAB — TROPONIN I (HIGH SENSITIVITY)
Troponin I (High Sensitivity): 4 ng/L (ref <18)
Troponin I (High Sensitivity): 3 ng/L (ref <18)

## 2021-06-06 LAB — CBC
HCT: 37.1 % — ABNORMAL LOW (ref 39.0–52.0)
Hemoglobin: 13.3 g/dL (ref 13.0–17.0)
MCH: 32 pg (ref 26.0–34.0)
MCHC: 35.8 g/dL (ref 30.0–36.0)
MCV: 89.4 fL (ref 80.0–100.0)
Platelets: 179 10*3/uL (ref 150–400)
RBC: 4.15 MIL/uL — ABNORMAL LOW (ref 4.22–5.81)
RDW: 12.1 % (ref 11.5–15.5)
WBC: 9.2 10*3/uL (ref 4.0–10.5)
nRBC: 0 % (ref 0.0–0.2)

## 2021-06-06 IMAGING — CT CT CERVICAL SPINE W/O CM
3 of 4 series · 12 of 33 positions shown, 14 images · non-contrast
Comparison: None.

CLINICAL DATA: Head trauma, minor (Age >= 65y)

EXAM:
CT CERVICAL SPINE WITHOUT CONTRAST
TECHNIQUE: Multidetector CT imaging of the cervical spine was performed without
intravenous contrast. Multiplanar CT image reconstructions were also
generated.

[Series 4: sagittal bone · sagittal · 0.30mm/px · 5 of 58 slices shown, 6 images]
[im 20/58  bone]
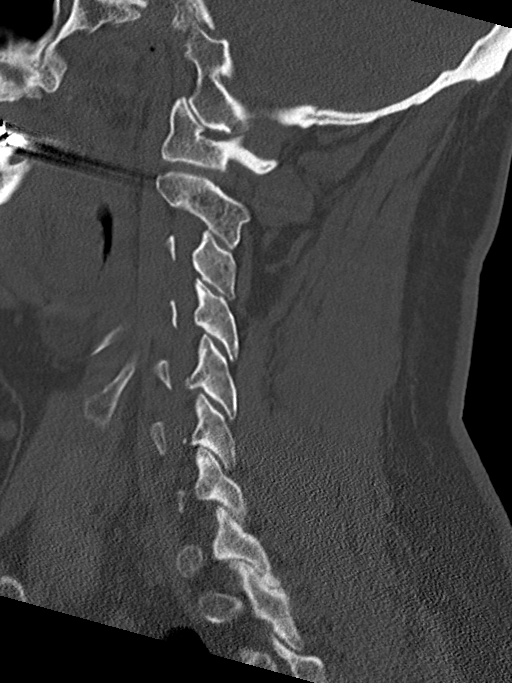
[im 24/58  bone]
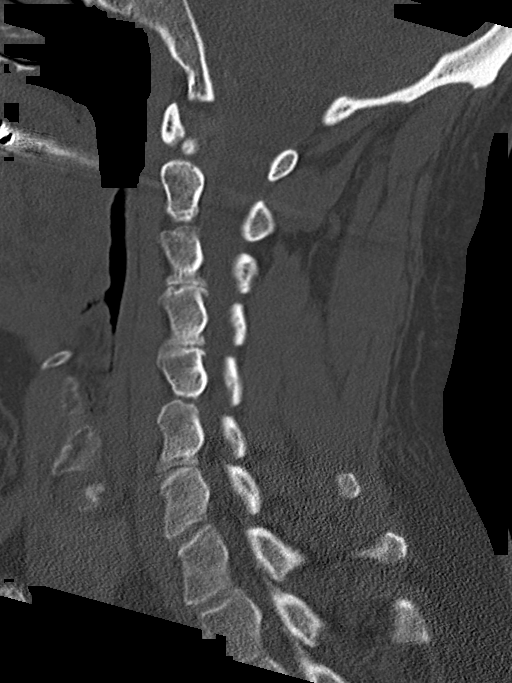
[im 29/58  soft-tissue]
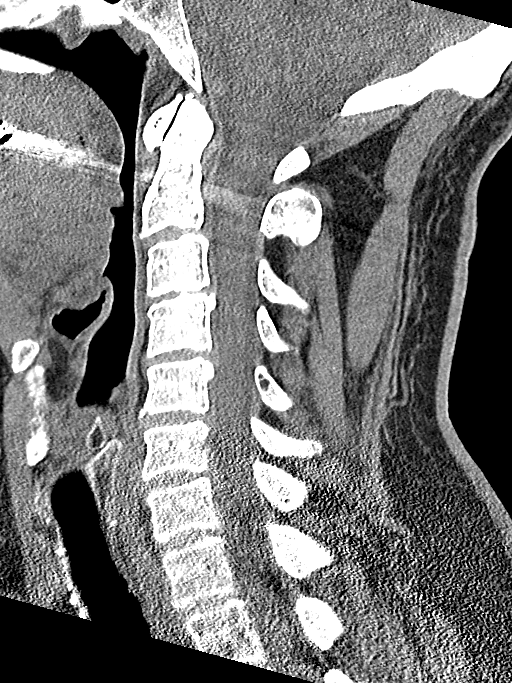
[im 29/58  bone]
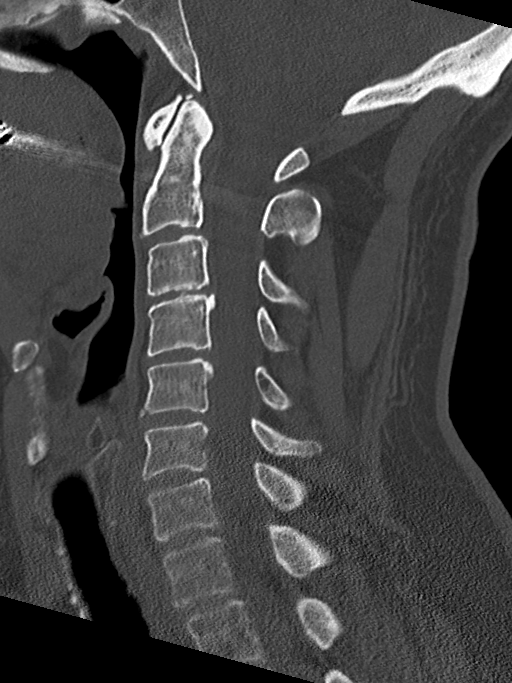
[im 34/58  bone]
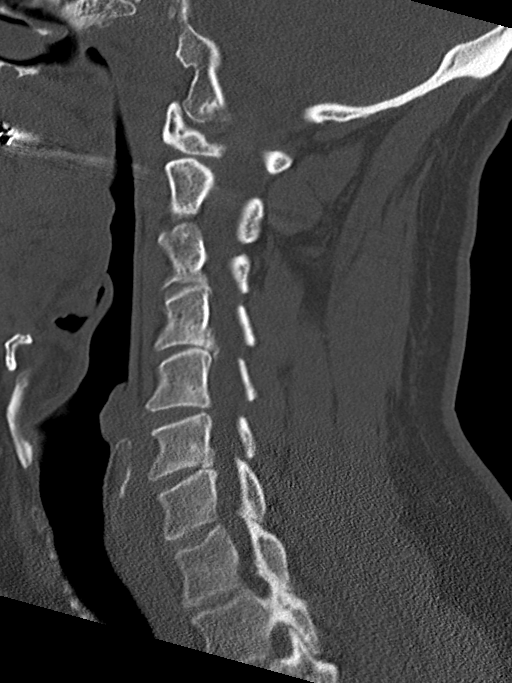
[im 39/58  bone]
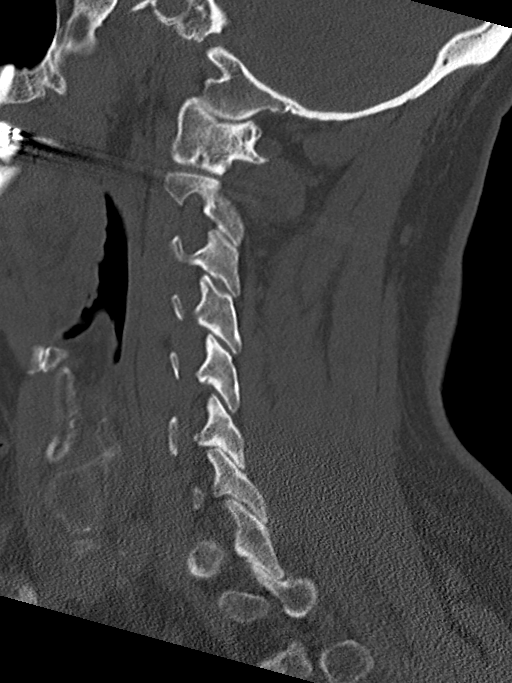

[Series 5: coronal bone · coronal · 0.27mm/px · 3 of 55 slices shown]
[im 11/55  bone]
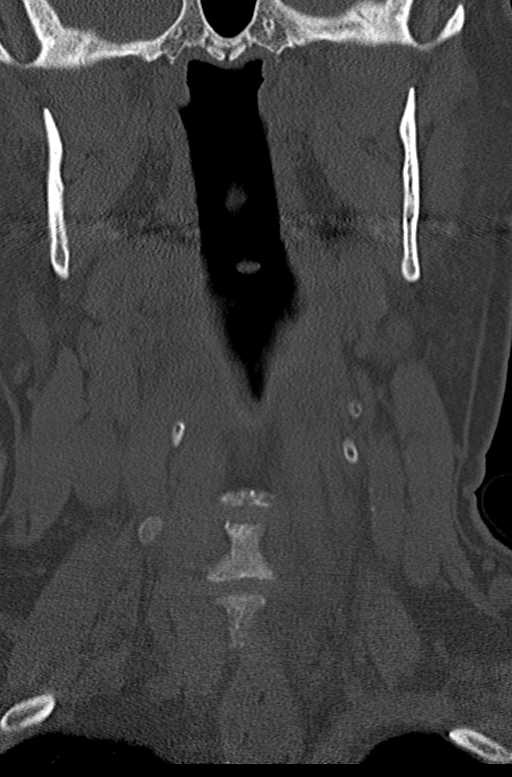
[im 22/55  bone]
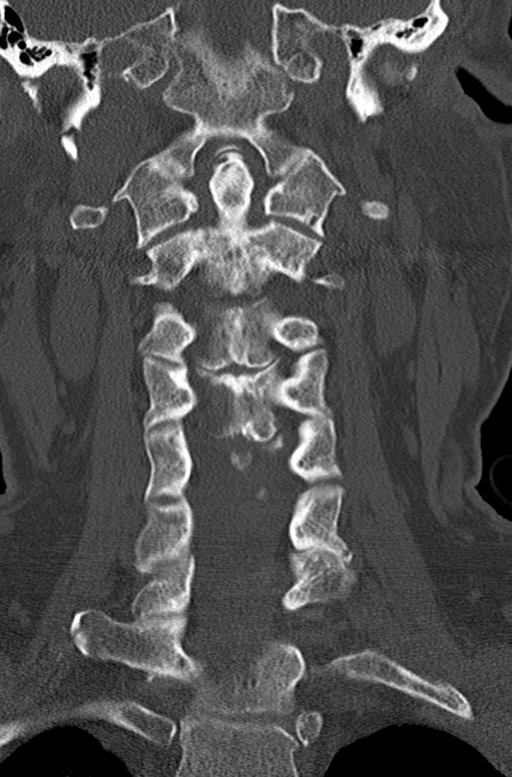
[im 33/55  bone]
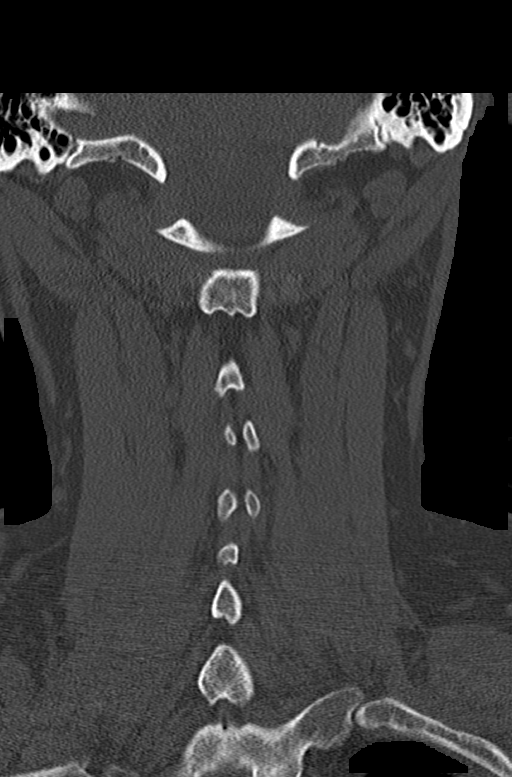

[Series 6: orthogonal bone · axial · 0.23mm/px · z∈[-330,-199]mm · 4 of 103 slices shown, 5 images]
[im 18/103  soft-tissue]
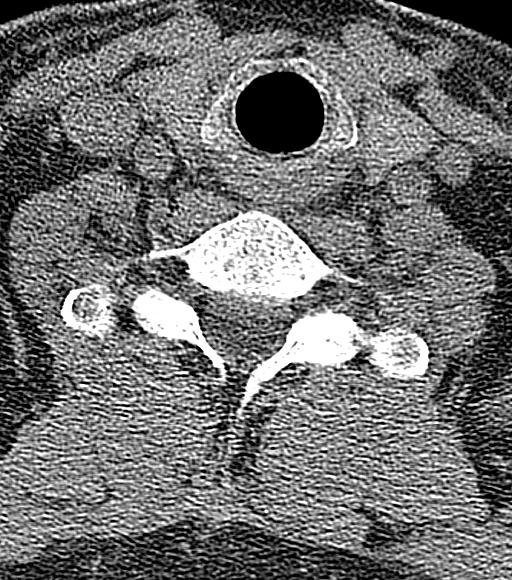
[im 18/103  bone]
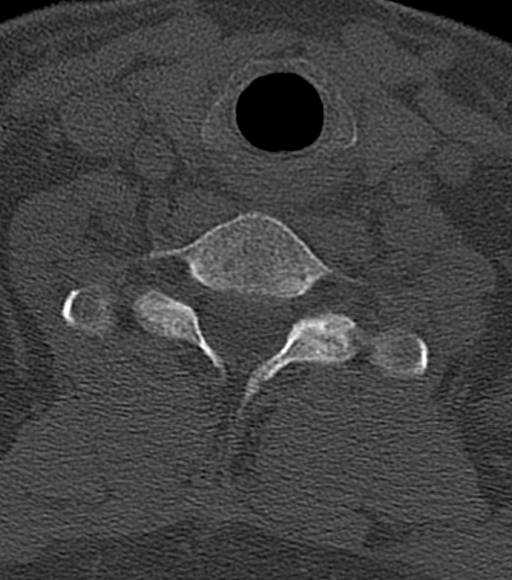
[im 35/103  bone]
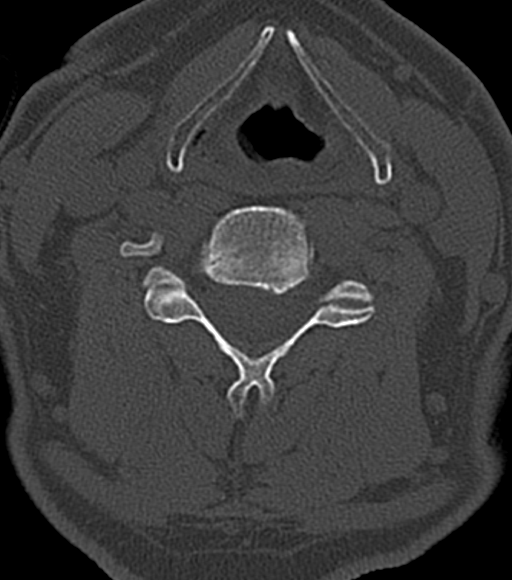
[im 69/103  bone]
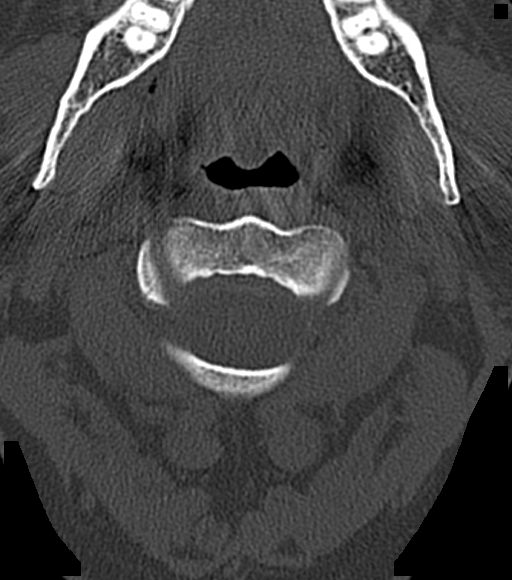
[im 86/103  bone]
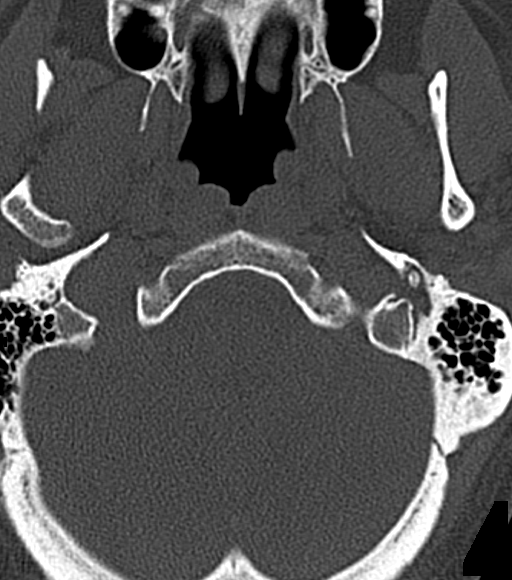

[12 of 33 positions shown; findings below may reference images not displayed]

FINDINGS: Alignment: Trace anterolisthesis of C2 on C3. no traumatic
subluxation.

Skull base and vertebrae: No acute fracture. Vertebral body heights
are maintained. The dens and skull base are intact. No focal lesion
or pathologic process.

Soft tissues and spinal canal: No prevertebral fluid or swelling. No
visible canal hematoma.

Disc levels: Disc space narrowing at C2-C3 with posterior disc
osteophyte complex, causing bilateral neural foraminal stenosis. No
narrowing of the spinal canal. Mild disc space narrowing and
spurring at C4-C5 causing mild left bony neural foraminal narrowing.

Upper chest: No acute or unexpected findings.

Other: None.
IMPRESSION: 1. No acute fracture or subluxation of the cervical spine.
2. Degenerative disc disease at C2-C3 and C4-C5.

## 2021-06-06 IMAGING — CR DG ELBOW COMPLETE 3+V*R*
1 series · 4 of 4 positions shown · non-contrast
Comparison: None.

CLINICAL DATA: Found on floor.

EXAM:
RIGHT ELBOW - COMPLETE 3+ VIEW

[Series 1: dg elbow complete right (3+view) · 0.14mm/px · 4 of 4 slices shown]
[im 1/4]
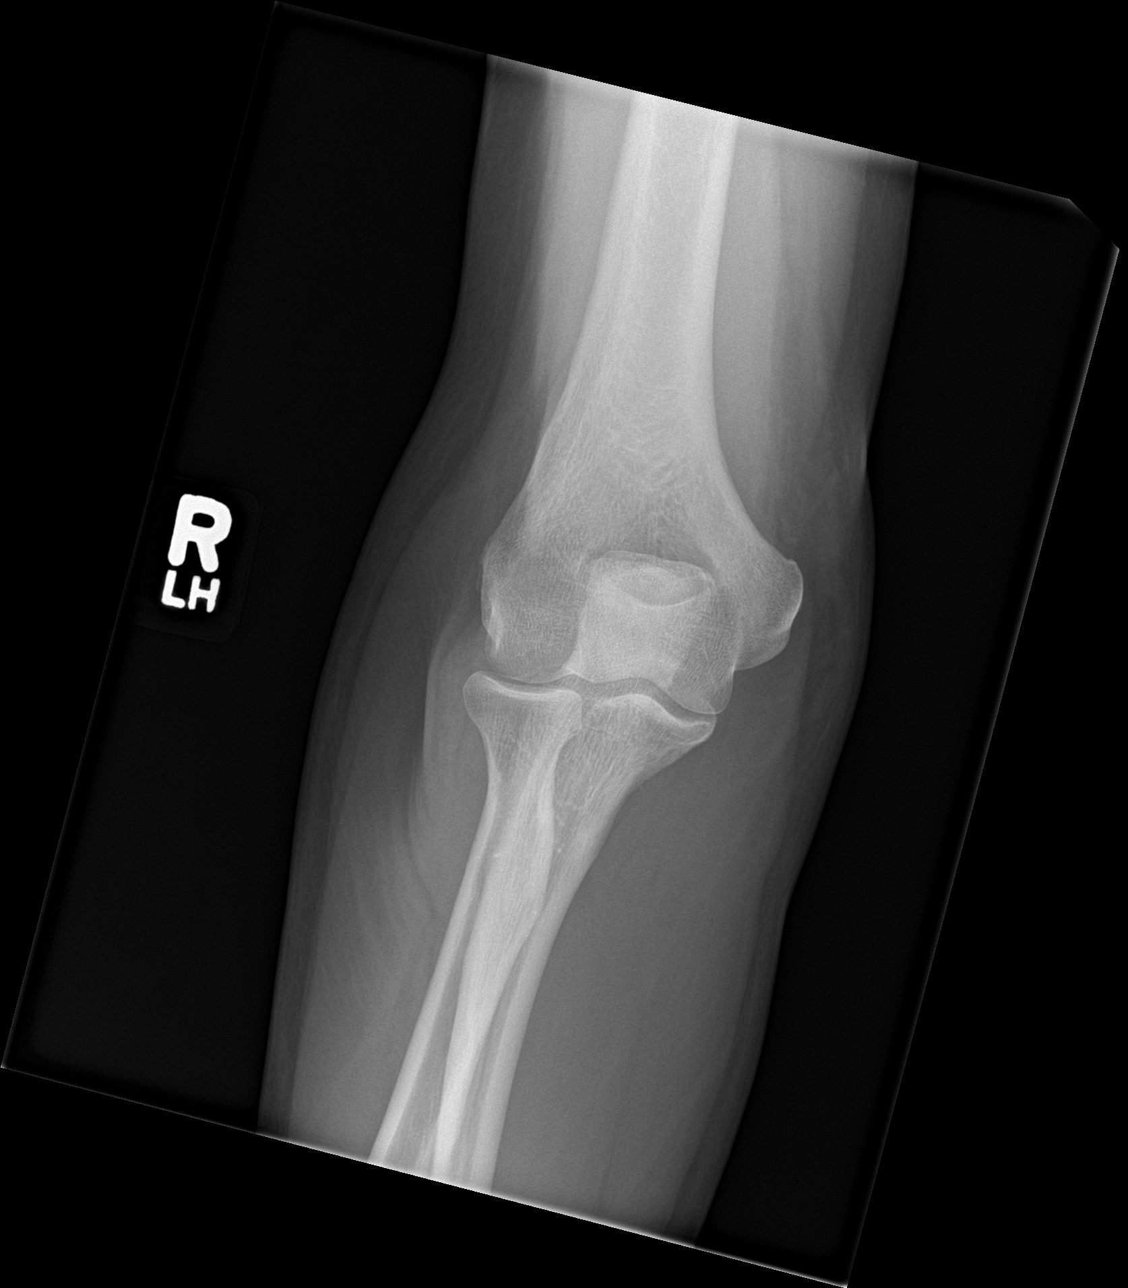
[im 2/4]
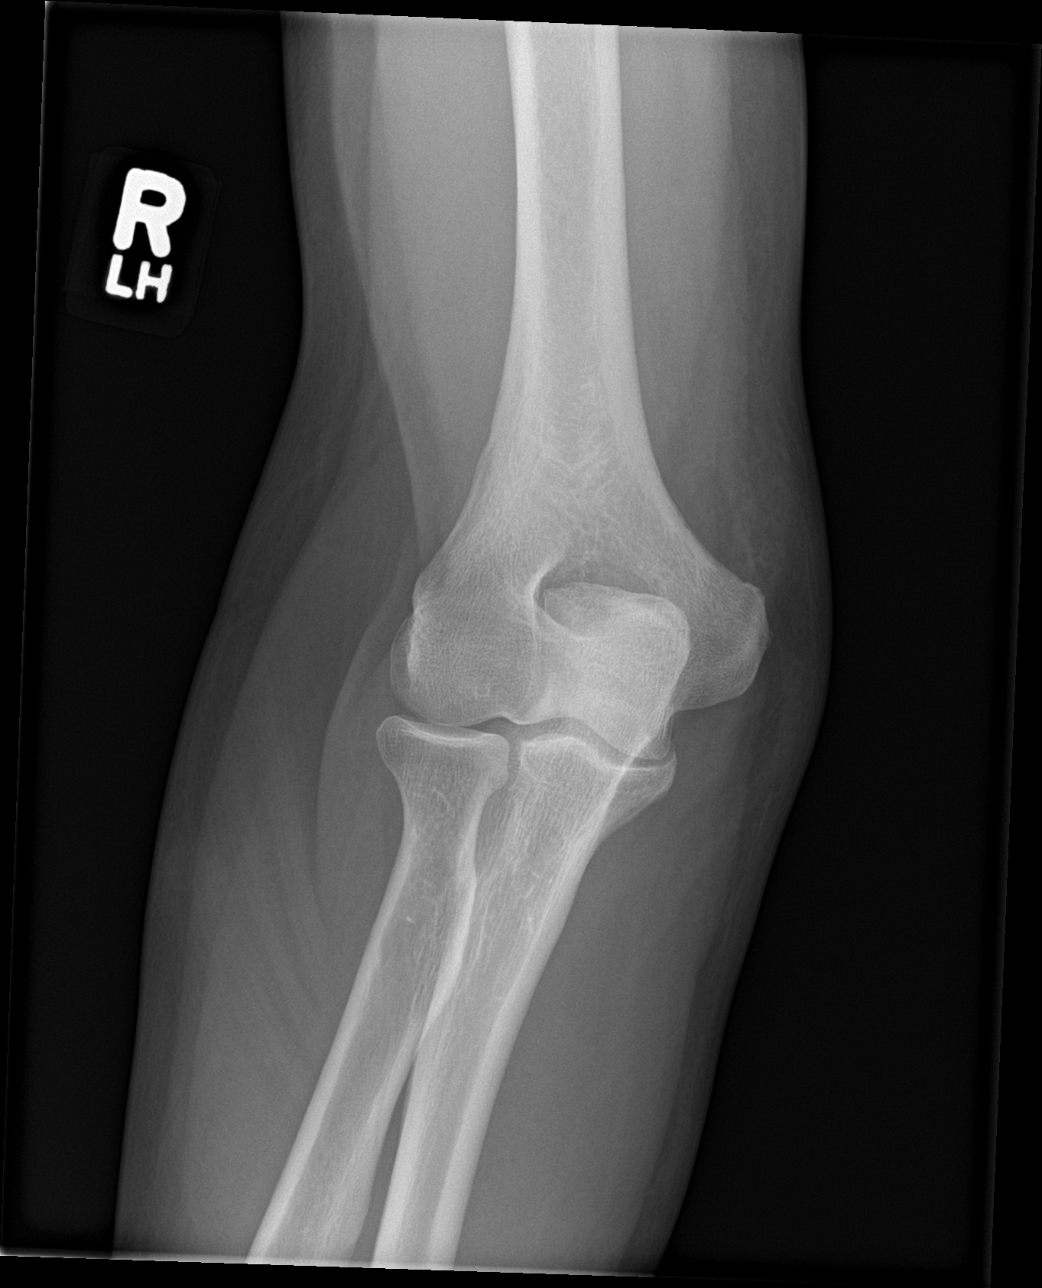
[im 3/4]
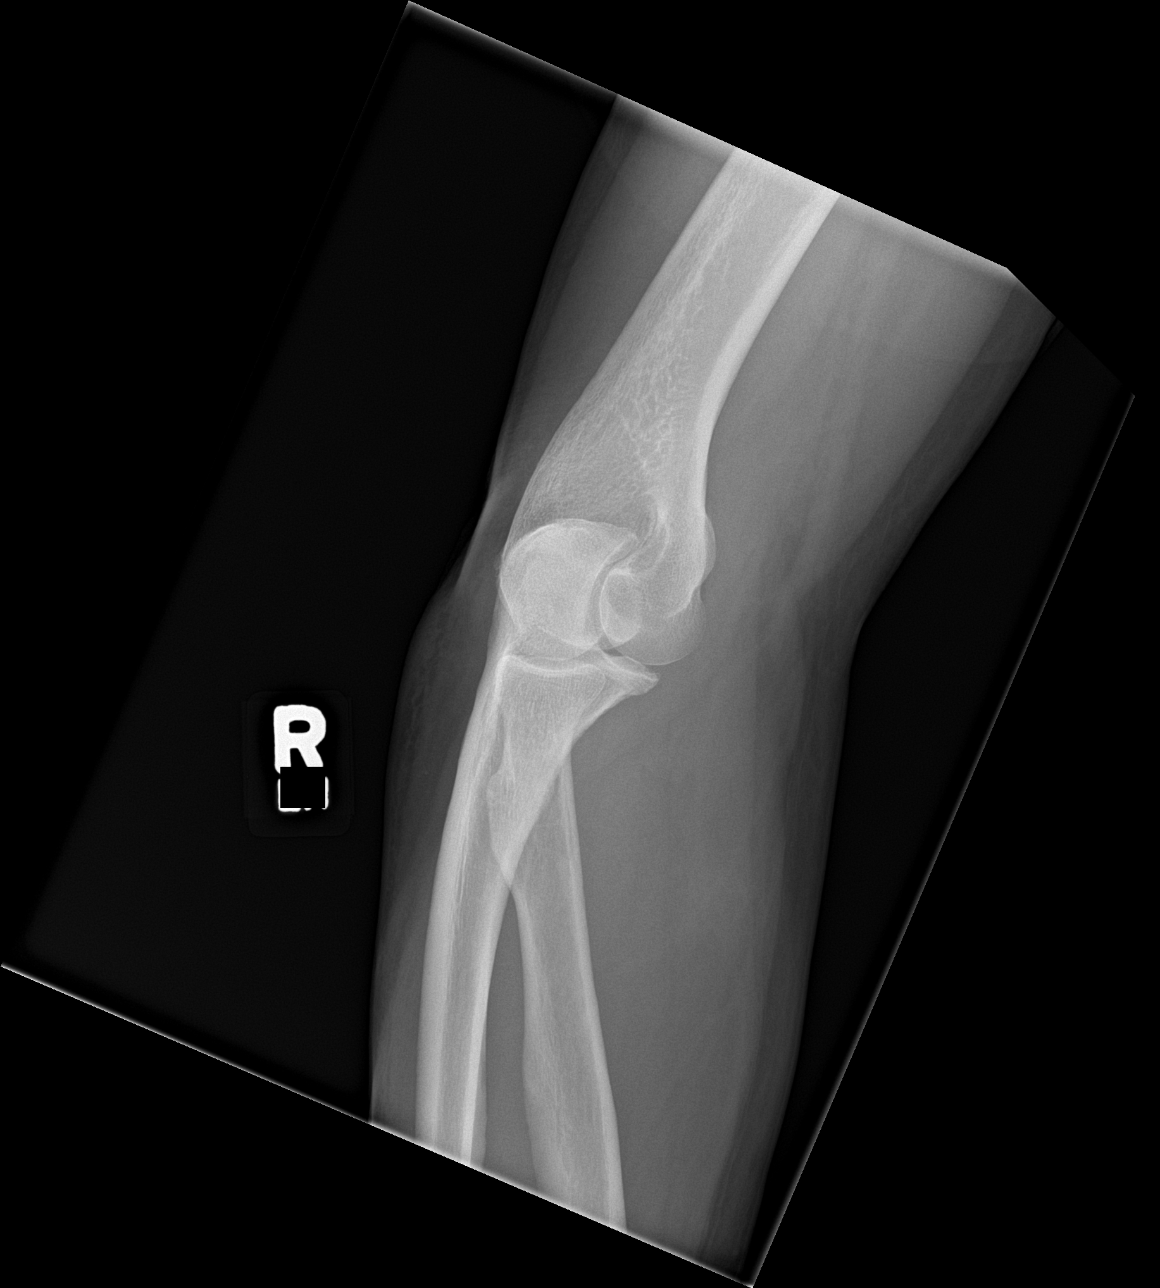
[im 4/4]
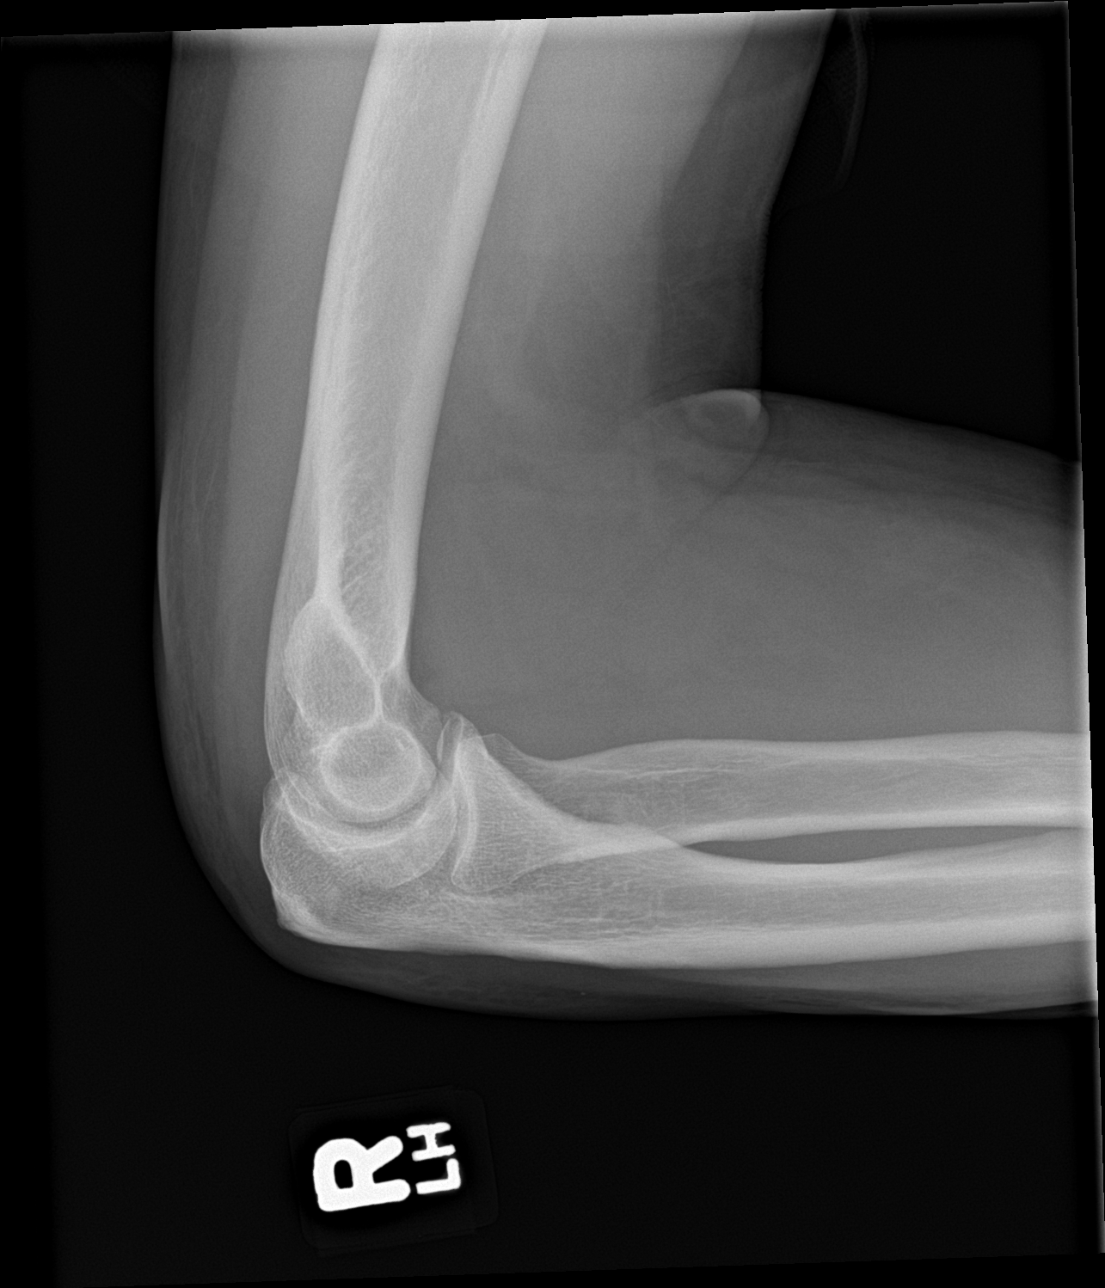

[4 of 4 positions shown; findings below may reference images not displayed]

FINDINGS: There is no evidence of fracture, dislocation, or joint effusion.
There is no evidence of arthropathy or other focal bone abnormality.
Soft tissues are unremarkable.
IMPRESSION: Negative.

## 2021-06-06 IMAGING — CT CT HEAD W/O CM
3 series · 15 of 47 positions shown, 18 images · non-contrast
Comparison: Head CT and brain MRI [DATE]

CLINICAL DATA: Syncope and dizziness.

EXAM:
CT HEAD WITHOUT CONTRAST
TECHNIQUE: Contiguous axial images were obtained from the base of the skull
through the vertex without intravenous contrast.

[Series 2: head wo · axial · 0.47mm/px · z∈[-162,-27]mm · 9 of 33 slices shown, 12 images]
[im 3/33  brain]
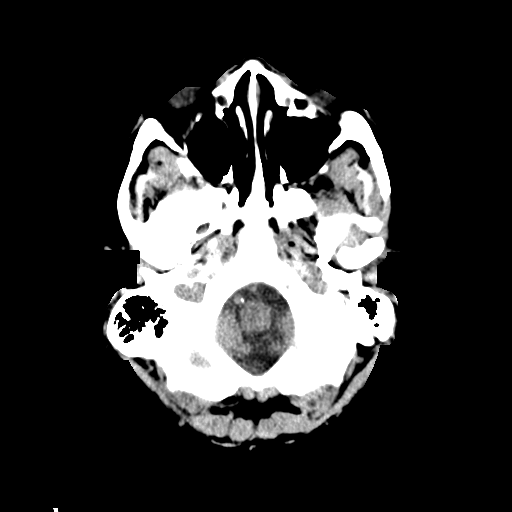
[im 3/33  bone]
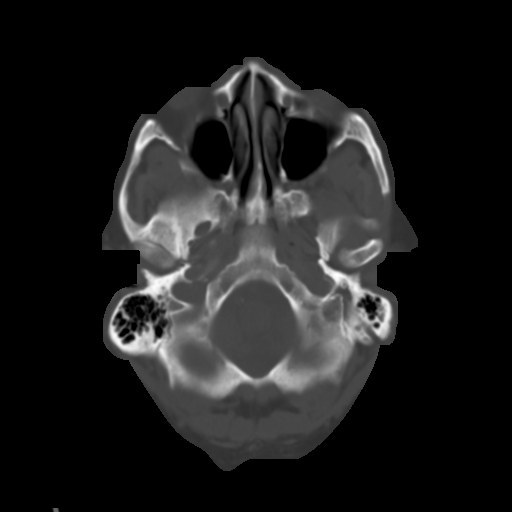
[im 6/33  brain]
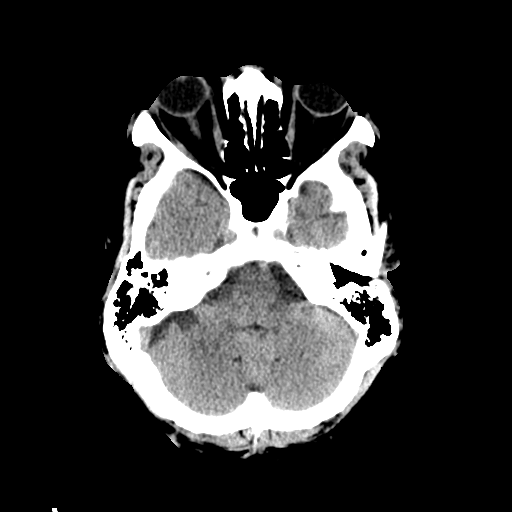
[im 9/33  brain]
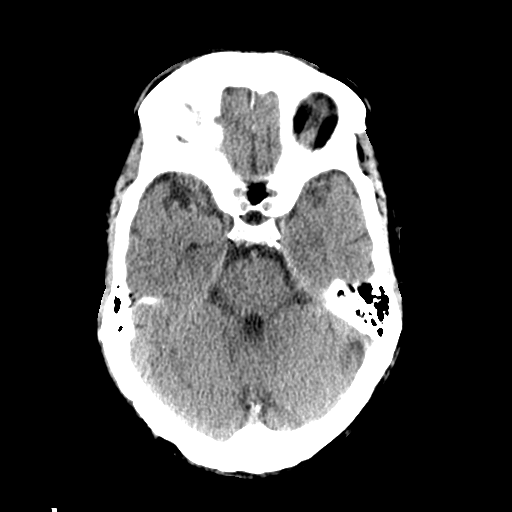
[im 13/33  brain]
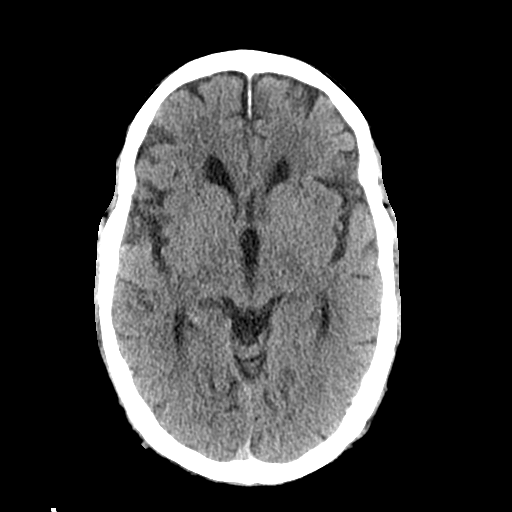
[im 17/33  brain]
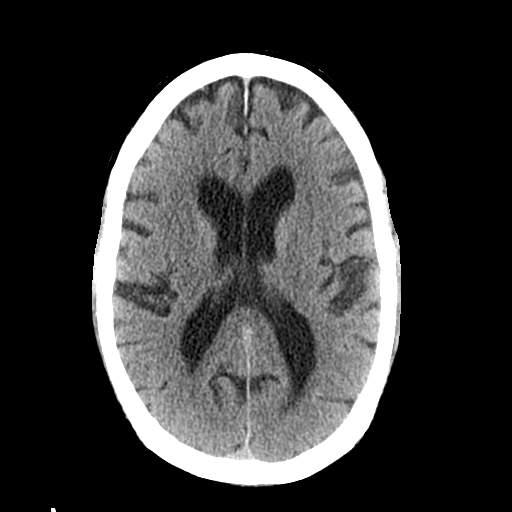
[im 17/33  bone]
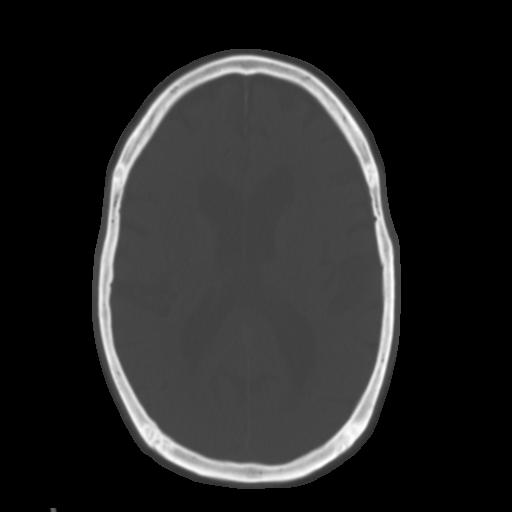
[im 20/33  brain]
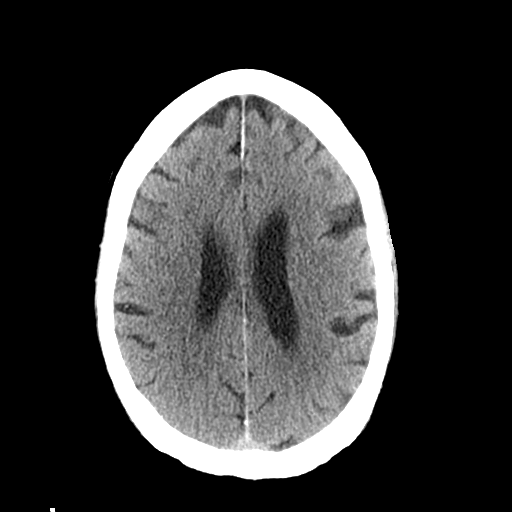
[im 24/33  brain]
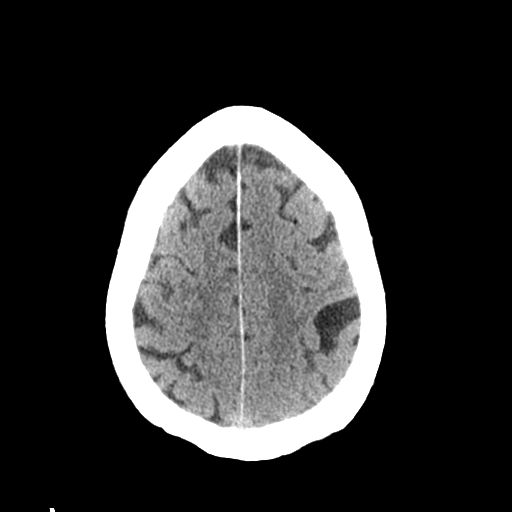
[im 27/33  brain]
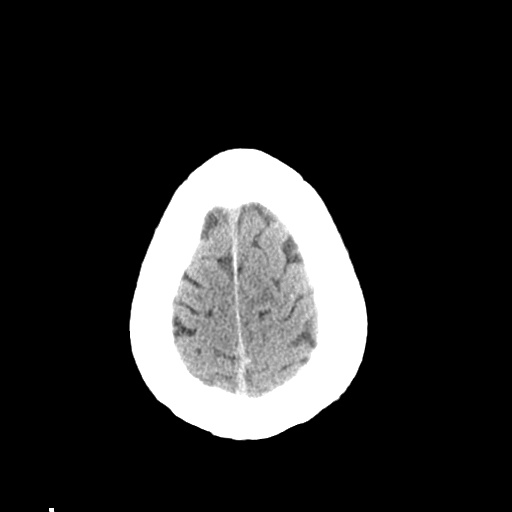
[im 30/33  brain]
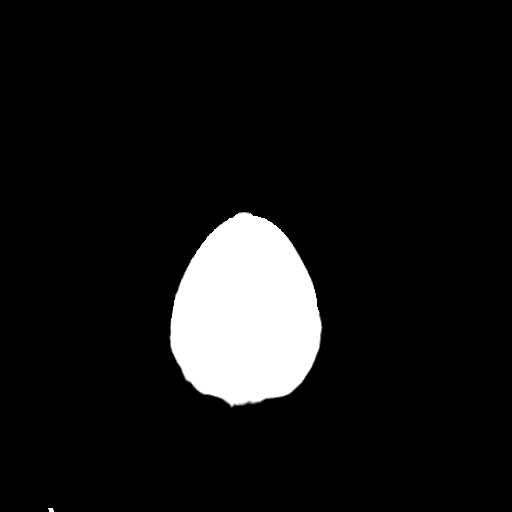
[im 30/33  bone]
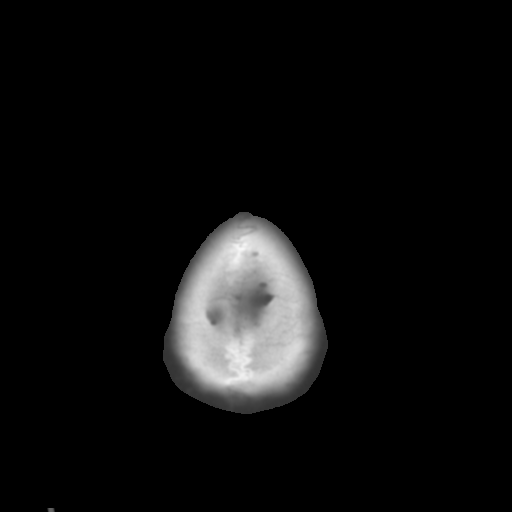

[Series 4: coronal soft tissue · coronal · 0.32mm/px · 3 of 77 slices shown]
[im 26/77  brain]
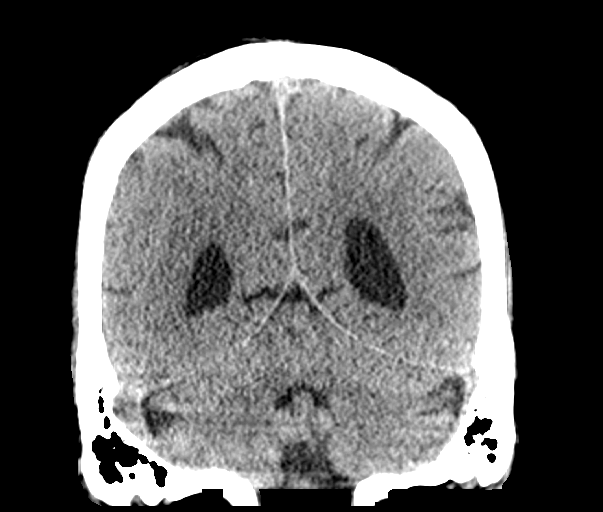
[im 34/77  brain]
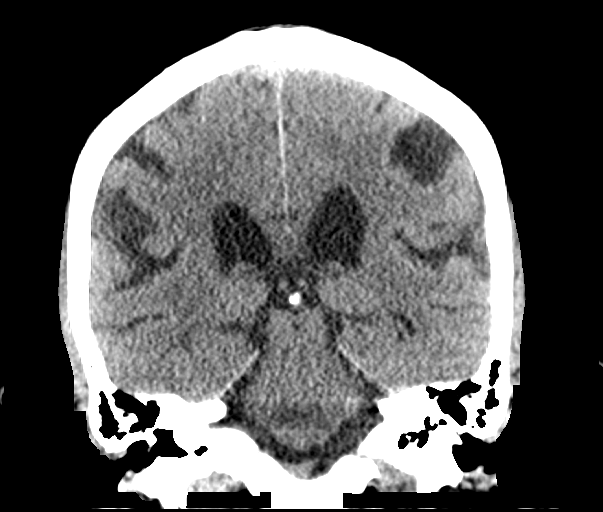
[im 43/77  brain]
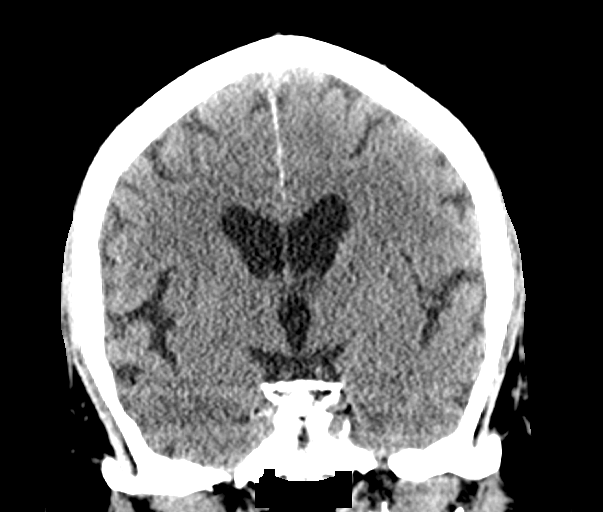

[Series 5: sagittal soft tissue · sagittal · 0.32mm/px · 3 of 53 slices shown]
[im 18/53  brain]
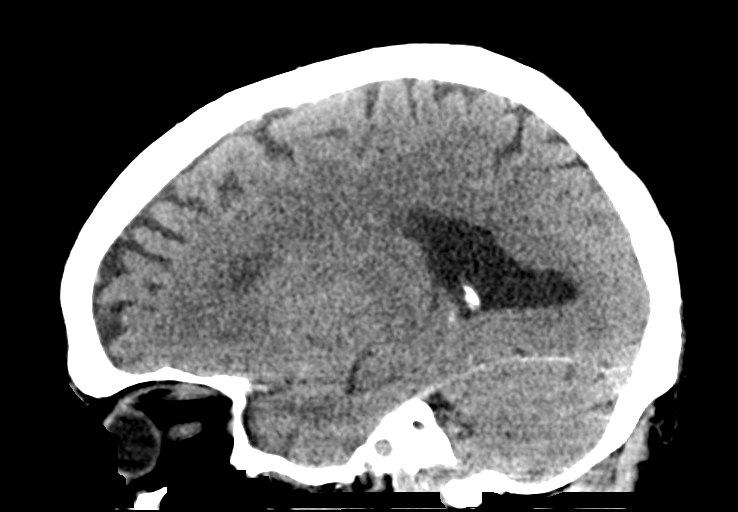
[im 27/53  brain]
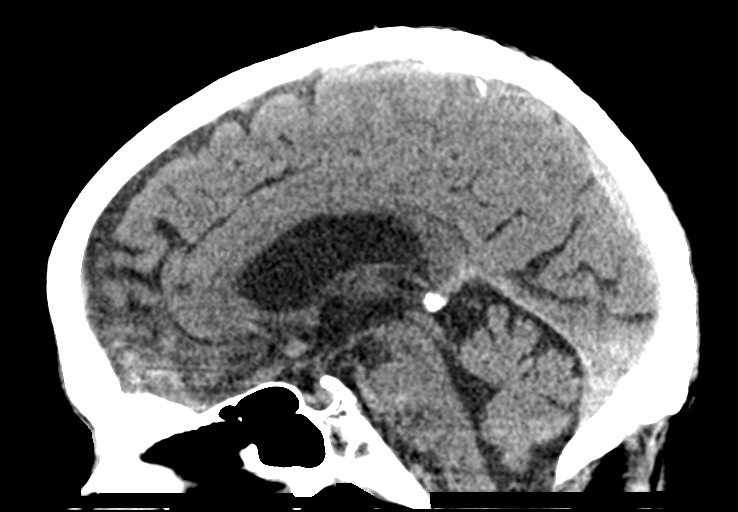
[im 35/53  brain]
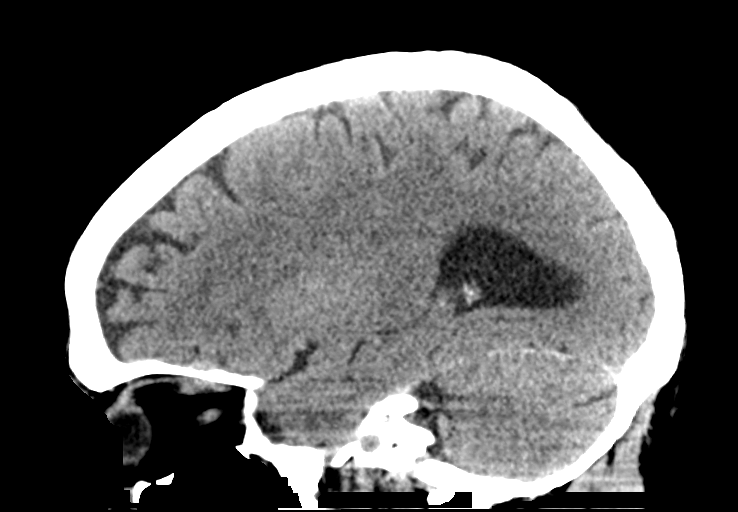

[15 of 47 positions shown; findings below may reference images not displayed]

FINDINGS: Brain: Stable age related atrophy and minor chronic small vessel
ischemia. No intracranial hemorrhage, mass effect, or midline shift.
No hydrocephalus. The basilar cisterns are patent. No evidence of
territorial infarct or acute ischemia. No extra-axial or
intracranial fluid collection.

Vascular: Atherosclerosis of skullbase vasculature without
hyperdense vessel or abnormal calcification.

Skull: No fracture or focal lesion.

Sinuses/Orbits: Frontal sinuses are hypo pneumatized. No acute
findings. No paranasal sinus inflammation. No mastoid effusion.
Orbits are unremarkable.

Other: None.
IMPRESSION: 1. No acute intracranial abnormality.
2. Stable age related atrophy and minor chronic small vessel
ischemia.

## 2021-06-06 NOTE — ED Notes (Signed)
Pt states he is feeling better and wishes to leave.

## 2021-06-06 NOTE — ED Notes (Signed)
First Nurse Note: Pt to ED via POV for dizziness. Pt is in NAD

## 2021-06-06 NOTE — ED Triage Notes (Signed)
Pt reports he had a syncopal episode today after feeling dizzy throughout day. Pt repots he left work early and went home and laid down. When pt got up, he black out and wife found him on floor. Pt unaware if he hit head. Pt reports pain to the right arm. Bruising and swelling noted to the right elbow.

## 2021-07-10 ENCOUNTER — Other Ambulatory Visit: Payer: Self-pay | Admitting: Urology

## 2021-07-10 DIAGNOSIS — R350 Frequency of micturition: Secondary | ICD-10-CM

## 2021-07-14 ENCOUNTER — Inpatient Hospital Stay (HOSPITAL_COMMUNITY): Payer: Medicare Other

## 2021-07-14 ENCOUNTER — Encounter (HOSPITAL_COMMUNITY)
Admission: EM | Disposition: A | Discharge status: Home / Self Care | Payer: Self-pay | Source: Home / Self Care | Attending: Cardiology

## 2021-07-14 ENCOUNTER — Inpatient Hospital Stay (HOSPITAL_COMMUNITY)
Admission: EM | Admit: 2021-07-14 | Discharge: 2021-07-16 | DRG: 262 | Disposition: A | Discharge status: Home / Self Care | Payer: Medicare Other | Attending: Cardiology | Admitting: Cardiology

## 2021-07-14 ENCOUNTER — Emergency Department (HOSPITAL_COMMUNITY): Payer: Medicare Other

## 2021-07-14 ENCOUNTER — Encounter (HOSPITAL_COMMUNITY): Payer: Self-pay | Admitting: Cardiology

## 2021-07-14 DIAGNOSIS — I252 Old myocardial infarction: Secondary | ICD-10-CM

## 2021-07-14 DIAGNOSIS — E785 Hyperlipidemia, unspecified: Secondary | ICD-10-CM | POA: Diagnosis present

## 2021-07-14 DIAGNOSIS — E119 Type 2 diabetes mellitus without complications: Secondary | ICD-10-CM | POA: Diagnosis not present

## 2021-07-14 DIAGNOSIS — I25118 Atherosclerotic heart disease of native coronary artery with other forms of angina pectoris: Secondary | ICD-10-CM | POA: Diagnosis present

## 2021-07-14 DIAGNOSIS — I10 Essential (primary) hypertension: Secondary | ICD-10-CM | POA: Diagnosis present

## 2021-07-14 DIAGNOSIS — Z7901 Long term (current) use of anticoagulants: Secondary | ICD-10-CM | POA: Diagnosis not present

## 2021-07-14 DIAGNOSIS — E1169 Type 2 diabetes mellitus with other specified complication: Secondary | ICD-10-CM | POA: Diagnosis present

## 2021-07-14 DIAGNOSIS — Z8249 Family history of ischemic heart disease and other diseases of the circulatory system: Secondary | ICD-10-CM | POA: Diagnosis not present

## 2021-07-14 DIAGNOSIS — E7800 Pure hypercholesterolemia, unspecified: Secondary | ICD-10-CM | POA: Diagnosis not present

## 2021-07-14 DIAGNOSIS — N3281 Overactive bladder: Secondary | ICD-10-CM | POA: Diagnosis present

## 2021-07-14 DIAGNOSIS — I129 Hypertensive chronic kidney disease with stage 1 through stage 4 chronic kidney disease, or unspecified chronic kidney disease: Secondary | ICD-10-CM | POA: Diagnosis present

## 2021-07-14 DIAGNOSIS — Z20822 Contact with and (suspected) exposure to covid-19: Secondary | ICD-10-CM | POA: Diagnosis present

## 2021-07-14 DIAGNOSIS — N183 Chronic kidney disease, stage 3 unspecified: Secondary | ICD-10-CM | POA: Diagnosis present

## 2021-07-14 DIAGNOSIS — E1122 Type 2 diabetes mellitus with diabetic chronic kidney disease: Secondary | ICD-10-CM | POA: Diagnosis present

## 2021-07-14 DIAGNOSIS — Z955 Presence of coronary angioplasty implant and graft: Secondary | ICD-10-CM

## 2021-07-14 DIAGNOSIS — Z7982 Long term (current) use of aspirin: Secondary | ICD-10-CM

## 2021-07-14 DIAGNOSIS — I442 Atrioventricular block, complete: Secondary | ICD-10-CM | POA: Diagnosis not present

## 2021-07-14 DIAGNOSIS — K219 Gastro-esophageal reflux disease without esophagitis: Secondary | ICD-10-CM | POA: Diagnosis present

## 2021-07-14 DIAGNOSIS — Z79899 Other long term (current) drug therapy: Secondary | ICD-10-CM

## 2021-07-14 DIAGNOSIS — Z950 Presence of cardiac pacemaker: Secondary | ICD-10-CM

## 2021-07-14 DIAGNOSIS — I2510 Atherosclerotic heart disease of native coronary artery without angina pectoris: Secondary | ICD-10-CM | POA: Diagnosis present

## 2021-07-14 DIAGNOSIS — Z7984 Long term (current) use of oral hypoglycemic drugs: Secondary | ICD-10-CM

## 2021-07-14 DIAGNOSIS — R0902 Hypoxemia: Secondary | ICD-10-CM

## 2021-07-14 DIAGNOSIS — E118 Type 2 diabetes mellitus with unspecified complications: Secondary | ICD-10-CM | POA: Diagnosis present

## 2021-07-14 HISTORY — PX: TEMPORARY PACEMAKER: CATH118268

## 2021-07-14 LAB — CBC WITH DIFFERENTIAL/PLATELET
Abs Immature Granulocytes: 0.05 10*3/uL (ref 0.00–0.07)
Basophils Absolute: 0 10*3/uL (ref 0.0–0.1)
Basophils Relative: 0 %
Eosinophils Absolute: 0.1 10*3/uL (ref 0.0–0.5)
Eosinophils Relative: 1 %
HCT: 35.2 % — ABNORMAL LOW (ref 39.0–52.0)
Hemoglobin: 12.2 g/dL — ABNORMAL LOW (ref 13.0–17.0)
Immature Granulocytes: 1 %
Lymphocytes Relative: 17 %
Lymphs Abs: 1.8 10*3/uL (ref 0.7–4.0)
MCH: 32.6 pg (ref 26.0–34.0)
MCHC: 34.7 g/dL (ref 30.0–36.0)
MCV: 94.1 fL (ref 80.0–100.0)
Monocytes Absolute: 0.7 10*3/uL (ref 0.1–1.0)
Monocytes Relative: 6 %
Neutro Abs: 8 10*3/uL — ABNORMAL HIGH (ref 1.7–7.7)
Neutrophils Relative %: 75 %
Platelets: 174 10*3/uL (ref 150–400)
RBC: 3.74 MIL/uL — ABNORMAL LOW (ref 4.22–5.81)
RDW: 12.2 % (ref 11.5–15.5)
WBC: 10.5 10*3/uL (ref 4.0–10.5)
nRBC: 0 % (ref 0.0–0.2)

## 2021-07-14 LAB — COMPREHENSIVE METABOLIC PANEL
ALT: 21 U/L (ref 0–44)
ALT: 22 U/L (ref 0–44)
AST: 21 U/L (ref 15–41)
AST: 18 U/L (ref 15–41)
Albumin: 3.1 g/dL — ABNORMAL LOW (ref 3.5–5.0)
Albumin: 3 g/dL — ABNORMAL LOW (ref 3.5–5.0)
Alkaline Phosphatase: 31 U/L — ABNORMAL LOW (ref 38–126)
Alkaline Phosphatase: 29 U/L — ABNORMAL LOW (ref 38–126)
Anion gap: 7 (ref 5–15)
Anion gap: 5 (ref 5–15)
BUN: 24 mg/dL — ABNORMAL HIGH (ref 8–23)
BUN: 23 mg/dL (ref 8–23)
CO2: 21 mmol/L — ABNORMAL LOW (ref 22–32)
CO2: 22 mmol/L (ref 22–32)
Calcium: 8 mg/dL — ABNORMAL LOW (ref 8.9–10.3)
Calcium: 7.6 mg/dL — ABNORMAL LOW (ref 8.9–10.3)
Chloride: 108 mmol/L (ref 98–111)
Chloride: 110 mmol/L (ref 98–111)
Creatinine, Ser: 1.43 mg/dL — ABNORMAL HIGH (ref 0.61–1.24)
Creatinine, Ser: 1.49 mg/dL — ABNORMAL HIGH (ref 0.61–1.24)
GFR, Estimated: 54 mL/min — ABNORMAL LOW (ref >60)
GFR, Estimated: 51 mL/min — ABNORMAL LOW (ref >60)
Glucose, Bld: 224 mg/dL — ABNORMAL HIGH (ref 70–99)
Glucose, Bld: 215 mg/dL — ABNORMAL HIGH (ref 70–99)
Potassium: 4.9 mmol/L (ref 3.5–5.1)
Potassium: 5.5 mmol/L — ABNORMAL HIGH (ref 3.5–5.1)
Sodium: 136 mmol/L (ref 135–145)
Sodium: 137 mmol/L (ref 135–145)
Total Bilirubin: 0.9 mg/dL (ref 0.3–1.2)
Total Bilirubin: 1.1 mg/dL (ref 0.3–1.2)
Total Protein: 5.3 g/dL — ABNORMAL LOW (ref 6.5–8.1)
Total Protein: 5.2 g/dL — ABNORMAL LOW (ref 6.5–8.1)

## 2021-07-14 LAB — POCT I-STAT EG7
Acid-base deficit: 3 mmol/L — ABNORMAL HIGH (ref 0.0–2.0)
Bicarbonate: 21.3 mmol/L (ref 20.0–28.0)
Calcium, Ion: 1.07 mmol/L — ABNORMAL LOW (ref 1.15–1.40)
HCT: 33 % — ABNORMAL LOW (ref 39.0–52.0)
Hemoglobin: 11.2 g/dL — ABNORMAL LOW (ref 13.0–17.0)
O2 Saturation: 92 %
Potassium: 4.9 mmol/L (ref 3.5–5.1)
Sodium: 140 mmol/L (ref 135–145)
TCO2: 22 mmol/L (ref 22–32)
pCO2, Ven: 34.6 mmHg — ABNORMAL LOW (ref 44.0–60.0)
pH, Ven: 7.397 (ref 7.250–7.430)
pO2, Ven: 64 mmHg — ABNORMAL HIGH (ref 32.0–45.0)

## 2021-07-14 LAB — CBC
HCT: 33.5 % — ABNORMAL LOW (ref 39.0–52.0)
Hemoglobin: 11.4 g/dL — ABNORMAL LOW (ref 13.0–17.0)
MCH: 31.9 pg (ref 26.0–34.0)
MCHC: 34 g/dL (ref 30.0–36.0)
MCV: 93.8 fL (ref 80.0–100.0)
Platelets: 168 10*3/uL (ref 150–400)
RBC: 3.57 MIL/uL — ABNORMAL LOW (ref 4.22–5.81)
RDW: 12.1 % (ref 11.5–15.5)
WBC: 13.4 10*3/uL — ABNORMAL HIGH (ref 4.0–10.5)
nRBC: 0 % (ref 0.0–0.2)

## 2021-07-14 LAB — LIPID PANEL
Cholesterol: 79 mg/dL (ref 0–200)
HDL: 27 mg/dL — ABNORMAL LOW (ref >40)
LDL Cholesterol: 34 mg/dL (ref 0–99)
Total CHOL/HDL Ratio: 2.9 RATIO
Triglycerides: 89 mg/dL (ref <150)
VLDL: 18 mg/dL (ref 0–40)

## 2021-07-14 LAB — RESP PANEL BY RT-PCR (FLU A&B, COVID) ARPGX2
Influenza A by PCR: NEGATIVE
Influenza B by PCR: NEGATIVE
SARS Coronavirus 2 by RT PCR: NEGATIVE

## 2021-07-14 LAB — PROTIME-INR
INR: 1.1 (ref 0.8–1.2)
Prothrombin Time: 14.2 seconds (ref 11.4–15.2)

## 2021-07-14 LAB — TROPONIN I (HIGH SENSITIVITY)
Troponin I (High Sensitivity): 3 ng/L (ref <18)
Troponin I (High Sensitivity): 3 ng/L (ref <18)
Troponin I (High Sensitivity): 118 ng/L (ref <18)

## 2021-07-14 LAB — GLUCOSE, CAPILLARY: Glucose-Capillary: 130 mg/dL — ABNORMAL HIGH (ref 70–99)

## 2021-07-14 LAB — ECHOCARDIOGRAM COMPLETE
Area-P 1/2: 3.31 cm2
S' Lateral: 3.1 cm

## 2021-07-14 LAB — MRSA NEXT GEN BY PCR, NASAL: MRSA by PCR Next Gen: NOT DETECTED

## 2021-07-14 LAB — APTT: aPTT: 30 seconds (ref 24–36)

## 2021-07-14 IMAGING — DX DG CHEST 1V PORT
1 series · 1 of 1 positions shown · non-contrast
Comparison: PA and lateral chest [DATE].

CLINICAL DATA: Chest pain, bradycardia and hypotension this
morning.

EXAM:
PORTABLE CHEST 1 VIEW

[chest ap]
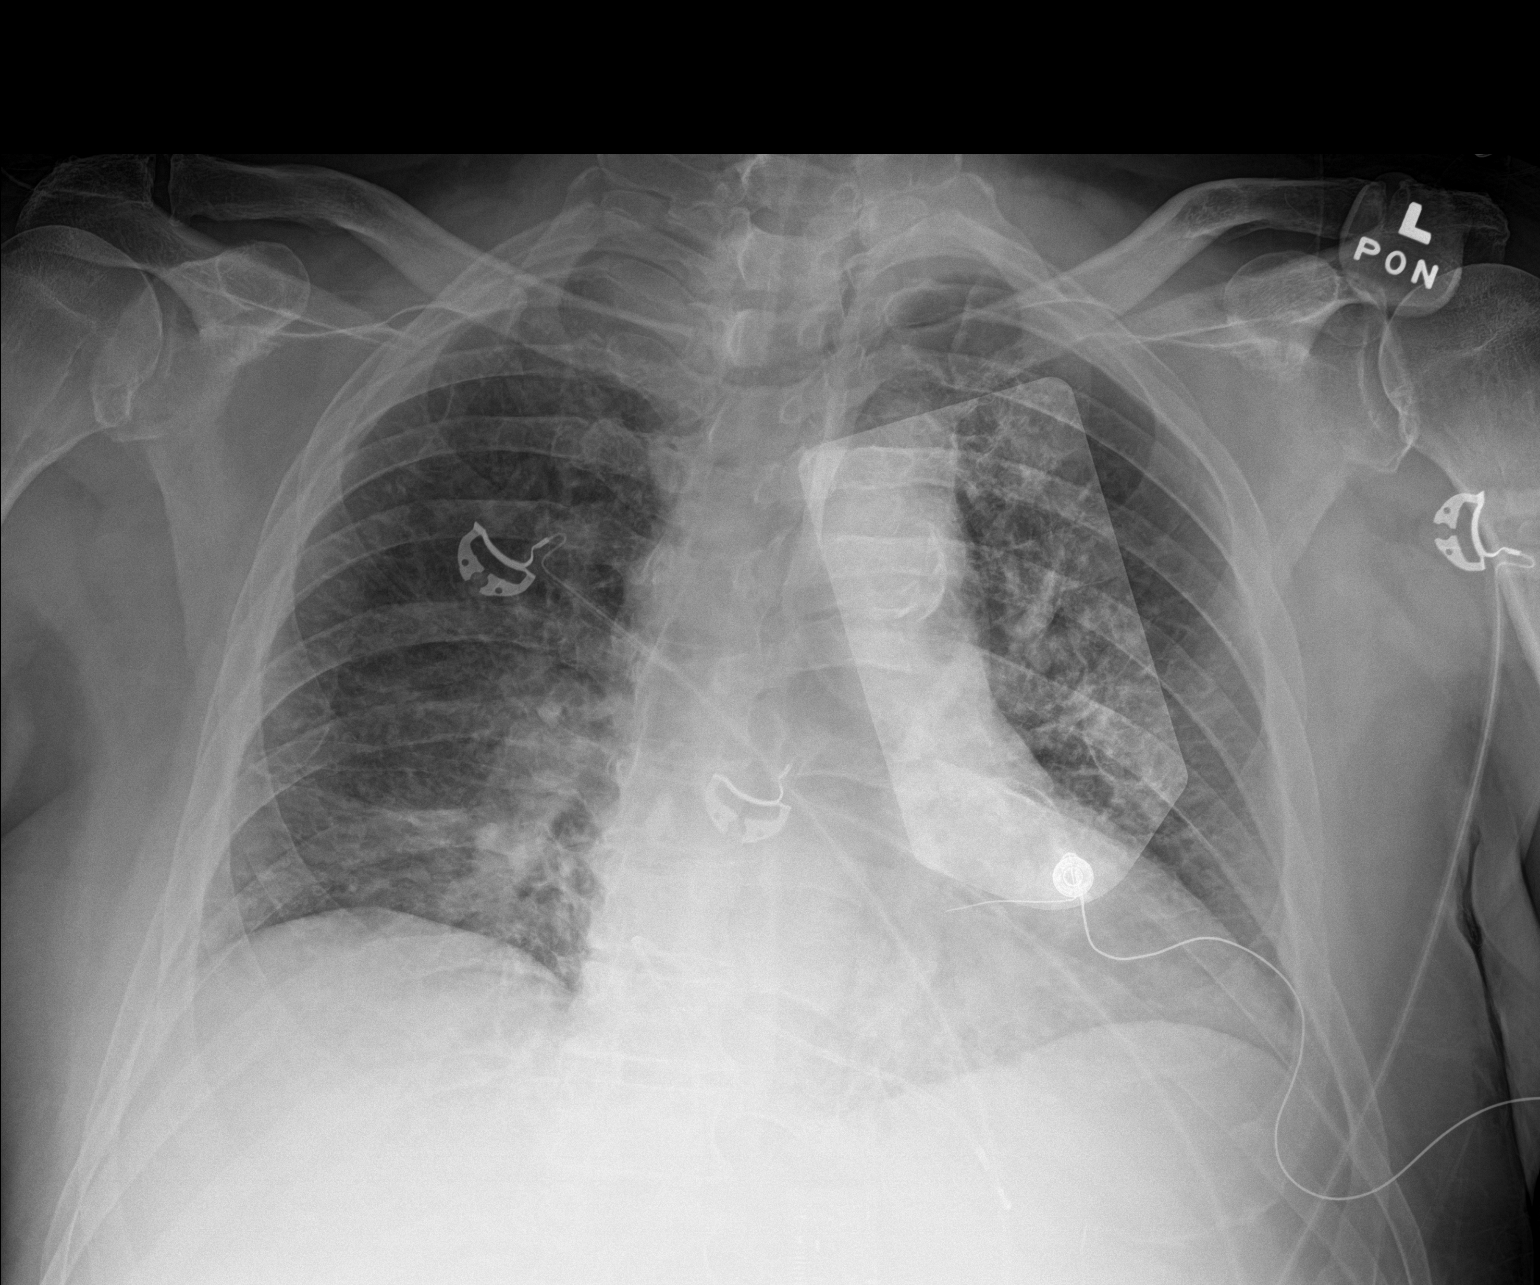

[1 of 1 positions shown; findings below may reference images not displayed]

FINDINGS: Small patchy focus of airspace opacity is seen in the right lung
base. Lungs otherwise clear. Heart size normal. Aortic
atherosclerosis. Defibrillator pad is in place. No acute or focal
bony abnormality.
IMPRESSION: Small focus of right basilar airspace opacity is likely due to
atelectasis.

Aortic Atherosclerosis ([M5]-[M5]).

## 2021-07-14 SURGERY — TEMPORARY PACEMAKER
Anesthesia: LOCAL

## 2021-07-14 MED ORDER — RANOLAZINE ER 500 MG PO TB12
1000.0000 mg | ORAL_TABLET | Freq: Two times a day (BID) | ORAL | Status: DC
  Administered 2021-07-14 – 2021-07-16 (×4): 1000 mg via ORAL
  Filled 2021-07-14 (×4): qty 2

## 2021-07-14 MED ORDER — ATROPINE SULFATE 1 MG/10ML IJ SOSY
PREFILLED_SYRINGE | INTRAMUSCULAR | Status: AC
  Filled 2021-07-14: qty 10

## 2021-07-14 MED ORDER — SODIUM CHLORIDE 0.9 % IV SOLN: 250.0000 mL | INTRAVENOUS | Status: DC | PRN

## 2021-07-14 MED ORDER — ATROPINE SULFATE 1 MG/ML IJ SOLN
INTRAMUSCULAR | Status: DC | PRN
  Administered 2021-07-14: 1 mg via INTRAVENOUS

## 2021-07-14 MED ORDER — LIDOCAINE HCL (PF) 1 % IJ SOLN
INTRAMUSCULAR | Status: AC
  Filled 2021-07-14: qty 30

## 2021-07-14 MED ORDER — ONDANSETRON HCL 4 MG/2ML IJ SOLN: 4.0000 mg | Freq: Four times a day (QID) | INTRAMUSCULAR | Status: DC | PRN

## 2021-07-14 MED ORDER — SODIUM CHLORIDE 0.9% FLUSH
3.0000 mL | Freq: Two times a day (BID) | INTRAVENOUS | Status: DC
  Administered 2021-07-14 – 2021-07-15 (×4): 3 mL via INTRAVENOUS

## 2021-07-14 MED ORDER — ATROPINE SULFATE 1 MG/10ML IJ SOSY
PREFILLED_SYRINGE | INTRAMUSCULAR | Status: DC | PRN
Start: 2021-07-14 — End: 2021-07-14
  Administered 2021-07-14: 1 mg via INTRAVENOUS

## 2021-07-14 MED ORDER — ISOSORBIDE MONONITRATE ER 60 MG PO TB24
90.0000 mg | ORAL_TABLET | Freq: Every day | ORAL | Status: DC
  Administered 2021-07-15 – 2021-07-16 (×2): 90 mg via ORAL
  Filled 2021-07-14 (×2): qty 1

## 2021-07-14 MED ORDER — TAMSULOSIN HCL 0.4 MG PO CAPS
0.4000 mg | ORAL_CAPSULE | Freq: Every day | ORAL | Status: DC
  Filled 2021-07-14: qty 1

## 2021-07-14 MED ORDER — ATORVASTATIN CALCIUM 40 MG PO TABS
40.0000 mg | ORAL_TABLET | Freq: Every day | ORAL | Status: DC
  Administered 2021-07-14 – 2021-07-15 (×2): 40 mg via ORAL
  Filled 2021-07-14 (×2): qty 1

## 2021-07-14 MED ORDER — FENTANYL CITRATE (PF) 100 MCG/2ML IJ SOLN
INTRAMUSCULAR | Status: AC | PRN
  Administered 2021-07-14: 100 ug via INTRAVENOUS

## 2021-07-14 MED ORDER — POTASSIUM 99 MG PO TABS: 99.0000 mg | ORAL_TABLET | Freq: Every day | ORAL | Status: DC

## 2021-07-14 MED ORDER — HEPARIN SODIUM (PORCINE) 5000 UNIT/ML IJ SOLN
5000.0000 [IU] | Freq: Three times a day (TID) | INTRAMUSCULAR | Status: DC
  Administered 2021-07-14 – 2021-07-16 (×5): 5000 [IU] via SUBCUTANEOUS
  Filled 2021-07-14 (×5): qty 1

## 2021-07-14 MED ORDER — GLIMEPIRIDE 2 MG PO TABS
2.0000 mg | ORAL_TABLET | Freq: Every day | ORAL | Status: DC
  Administered 2021-07-15 – 2021-07-16 (×2): 2 mg via ORAL
  Filled 2021-07-14 (×2): qty 1

## 2021-07-14 MED ORDER — SODIUM CHLORIDE 0.9% FLUSH: 3.0000 mL | INTRAVENOUS | Status: DC | PRN

## 2021-07-14 MED ORDER — HEPARIN (PORCINE) IN NACL 1000-0.9 UT/500ML-% IV SOLN
INTRAVENOUS | Status: DC | PRN
  Administered 2021-07-14: 500 mL

## 2021-07-14 MED ORDER — PANTOPRAZOLE SODIUM 40 MG PO TBEC
40.0000 mg | DELAYED_RELEASE_TABLET | Freq: Two times a day (BID) | ORAL | Status: DC
  Administered 2021-07-14 – 2021-07-16 (×4): 40 mg via ORAL
  Filled 2021-07-14 (×4): qty 1

## 2021-07-14 MED ORDER — FENTANYL CITRATE (PF) 100 MCG/2ML IJ SOLN
INTRAMUSCULAR | Status: AC
  Filled 2021-07-14: qty 2

## 2021-07-14 MED ORDER — LORAZEPAM 2 MG/ML IJ SOLN
INTRAMUSCULAR | Status: AC
  Filled 2021-07-14: qty 1

## 2021-07-14 MED ORDER — SODIUM CHLORIDE 0.9 % IV SOLN: INTRAVENOUS | Status: DC

## 2021-07-14 MED ORDER — LIDOCAINE HCL (PF) 1 % IJ SOLN
INTRAMUSCULAR | Status: DC | PRN
  Administered 2021-07-14: 10 mL

## 2021-07-14 MED ORDER — CHLORHEXIDINE GLUCONATE CLOTH 2 % EX PADS
6.0000 | MEDICATED_PAD | Freq: Every day | CUTANEOUS | Status: DC
  Administered 2021-07-14 – 2021-07-16 (×3): 6 via TOPICAL

## 2021-07-14 MED ORDER — INSULIN ASPART 100 UNIT/ML IJ SOLN
0.0000 [IU] | Freq: Three times a day (TID) | INTRAMUSCULAR | Status: DC
  Administered 2021-07-15: 3 [IU] via SUBCUTANEOUS
  Administered 2021-07-16: 5 [IU] via SUBCUTANEOUS

## 2021-07-14 MED ORDER — ORAL CARE MOUTH RINSE
15.0000 mL | Freq: Two times a day (BID) | OROMUCOSAL | Status: DC
  Administered 2021-07-14 – 2021-07-16 (×4): 15 mL via OROMUCOSAL

## 2021-07-14 MED ORDER — ACETAMINOPHEN 325 MG PO TABS: 650.0000 mg | ORAL_TABLET | ORAL | Status: DC | PRN

## 2021-07-14 SURGICAL SUPPLY — 6 items
CABLE ADAPT PACING TEMP 12FT (ADAPTER) ×2 IMPLANT
CATH S G BIP PACING (CATHETERS) ×2 IMPLANT
PACK CARDIAC CATHETERIZATION (CUSTOM PROCEDURE TRAY) ×2 IMPLANT
SHEATH PINNACLE 6F 10CM (SHEATH) ×2 IMPLANT
SHEATH PROBE COVER 6X72 (BAG) ×2 IMPLANT
SLEEVE REPOSITIONING LENGTH 30 (MISCELLANEOUS) ×2 IMPLANT

## 2021-07-14 NOTE — Interval H&P Note (Signed)
History and Physical Interval Note:  07/14/2021 12:20 PM  Patrick [name]  has presented today for surgery, with the diagnosis of heart block.  The various methods of treatment have been discussed with the patient and family. After consideration of risks, benefits and other options for treatment, the patient has consented to  Procedure(s): TEMPORARY PACEMAKER (N/A) as a surgical intervention.  The patient's history has been reviewed, patient examined, no change in status, stable for surgery.  I have reviewed the patient's chart and labs.  Questions were answered to the patient's satisfaction.     Bryan Lemma

## 2021-07-14 NOTE — Code Documentation (Signed)
1mg ativan given 

## 2021-07-14 NOTE — ED Triage Notes (Signed)
Pt arrives via [facility] with bradycardia and hypotension. Pt had syncopal episode at work.   Pacing started 11:06 with Effie Shy, MD at bedside

## 2021-07-14 NOTE — H&P (Signed)
Cardiology Admission History and Physical:   Patient ID: Patrick [name] MRN: [id number]; DOB: [birth year]   Admission date: 07/14/2021  PCP:  Lauro Regulus, MD   [facility] Providers Cardiologist:  Nicki Guadalajara, MD        Chief Complaint:  syncope  Patient Profile:   Patrick [name] is a 65 y.o. male with CAD who is being seen 07/14/2021 for the evaluation of syncope and CHB.  History of Present Illness:   Patrick [name] was in his usual state of health when he presented to work today.  The onset of loss of consciousness and was found to be bradycardic and hypotensive.  ECG revealed complete heart block.  Previous ECG showed evidence of conduction system disease (RBBB dating back at least to 2018, long PR interval).  According to his wife, Patrick [name], this is [name]'s fourth episode of near syncope or syncope this year.  Previous events have been associated with falls and injury, thankfully limited to bruising, which is seen on his left elbow, right inner thigh, left ankle.  Each time he had a relatively quick recovery without any intervention.  None of the episodes of syncope or near syncope were associated with angina or dyspnea or subsequent postictal state or focal neurological deficits.  He had an ED visit in April.  At that time ECG shows sinus rhythm, first-degree AV block and right bundle branch block.  CT of the head and MRA were both unremarkable  Most recently he was seen by his primary care provider on 06/11/2021 for this complaints and a 48-hour Holter monitor was ordered.  As far as I can tell this showed normal findings.  I cannot review the tracings or even see the reported results but there is a note titled "Holter monitor results" in CareEverywhere: "Notified patient per Dr. Dareen Piano, holter normal and recommends keeping stress echo appointment. Patient verbalized understanding."  Currently he is receiving transcutaneous pacing and has received sedatives.  He is groggy, but  easily arousable and responds appropriately to questions.  Extremities are warm.  He is emergently going to the Cath Lab for temporary transvenous pacemaker.  He underwent angioplasty/stent to the LAD in August 2018 (resolute Onyx 3.0x38 drug-eluting stent) in the setting of acute anterior wall STEMI.  Note that he had transient complete heart block during that procedure which responded to IV atropine.  Most recent cardiac catheterization performed in April 2019 showed a patent stent in the proximal to mid LAD and normal left ventricular systolic function with a very small area of mid anterolateral hypocontractility, EF 50-55%.  No lesions were seen in the dominant right coronary artery.  A follow-up nuclear perfusion study in October 2020 a low restudy with a suggestion of mild mid-basal inferior wall ischemia, EF 59%.   He has well-controlled diabetes mellitus with a hemoglobin A1c of 6.1% in April 2022. His most recent LDL cholesterol was 53 on statin therapy.  He has normal renal function.   Past Medical History:  Diagnosis Date   CAD in native artery    a. anterolateral STEMI 07/2017 s/p DES to prox-mid LAD, EF 55%.   GERD (gastroesophageal reflux disease)    Hyperlipidemia    Hypertension    Overactive bladder    Renal calculi    Uncontrolled diabetes mellitus (HCC)     Past Surgical History:  Procedure Laterality Date   CORONARY/GRAFT ACUTE MI REVASCULARIZATION N/A 08/04/2017   Procedure: Coronary/Graft Acute MI Revascularization;  Surgeon: Lennette Bihari, MD;  Location: [facility] CV LAB;  Service: Cardiovascular;  Laterality: N/A;   LEFT HEART CATH AND CORONARY ANGIOGRAPHY N/A 08/04/2017   Procedure: LEFT HEART CATH AND CORONARY ANGIOGRAPHY;  Surgeon: Lennette Bihari, MD;  Location: [facility] CV [facility];  Service: Cardiovascular;  Laterality: N/A;   LEFT HEART CATH AND CORONARY ANGIOGRAPHY N/A 03/21/2018   Procedure: LEFT HEART CATH AND CORONARY ANGIOGRAPHY;  Surgeon: Lennette Bihari,  MD;  Location: [facility]AS[facility] CV LAB;  Service: Cardiovascular;  Laterality: N/A;   PROSTATE SURGERY       Medications Prior to Admission: Prior to Admission medications   Medication Sig Start Date End Date Taking? Authorizing Provider  AMBULATORY NON FORMULARY MEDICATION Trimix (30/2/20)-(Pap/Phent/PGE)  Dose  Inject 0.7 mcg per injection  Vial 76ml  Qty #3 Refills 0  [facility] [phone] Fax [phone] 03/19/21   Michiel Cowboy A, PA-C  amLODipine (NORVASC) 10 MG tablet TAKE 1 TABLET(10 MG) BY MOUTH DAILY 04/18/21   Hilty, Lisette Abu, MD  aspirin (ASPIRIN LOW DOSE) 81 MG EC tablet Take 1 tablet (81 mg total) by mouth daily. 11/01/18   Lennette Bihari, MD  atorvastatin (LIPITOR) 40 MG tablet TAKE 1 TABLET(40 MG) BY MOUTH EVERY EVENING 01/03/19   Lennette Bihari, MD  BRILINTA 60 MG TABS tablet TAKE 1 TABLET BY MOUTH TWICE DAILY 11/15/20   Lennette Bihari, MD  Coenzyme Q10 200 MG capsule Take 200 mg by mouth daily.    [provider]  glimepiride (AMARYL) 2 MG tablet Take 2 mg by mouth daily with breakfast.    [provider]  Glucosamine-Chondroit-Vit C-Mn (GLUCOSAMINE 1500 COMPLEX PO) Take 1,500 mg by mouth daily.    [provider]  isosorbide mononitrate (IMDUR) 60 MG 24 hr tablet TAKE 1 AND 1/2 TABLETS(90 MG) BY MOUTH DAILY 03/31/21   Lennette Bihari, MD  losartan (COZAAR) 100 MG tablet TAKE 1 TABLET(100 MG) BY MOUTH DAILY 05/17/20   Lennette Bihari, MD  Magnesium 250 MG TABS Take 250 mg by mouth daily.    [provider]  Magnesium Citrate 100 MG TABS Take 100 mg by mouth daily.    [provider]  metFORMIN (GLUCOPHAGE-XR) 500 MG 24 hr tablet Take 1,000 mg by mouth 2 (two) times daily. 02/25/21   [provider]  metoprolol tartrate (LOPRESSOR) 50 MG tablet TAKE 1 TABLET(50 MG) BY MOUTH TWICE DAILY 04/28/21   Lennette Bihari, MD  mirabegron ER (MYRBETRIQ) 25 MG TB24 tablet TAKE 1 TABLET(25 MG) BY MOUTH DAILY 10/15/20    McGowan, Carollee Herter A, PA-C  Multiple Vitamins-Minerals (ABC PLUS SENIOR PO) Take 1 tablet by mouth daily.    [provider]  nitroGLYCERIN (NITROSTAT) 0.4 MG SL tablet Place 1 tablet (0.4 mg total) under the tongue every 5 (five) minutes x 3 doses as needed for chest pain. 10/11/19   Lennette Bihari, MD  Omega-3 Fatty Acids (FISH OIL OMEGA-3 PO) Take 2 capsules by mouth 2 (two) times daily.     [provider]  pantoprazole (PROTONIX) 40 MG tablet Take 40 mg by mouth 2 (two) times daily.    [provider]  Potassium 99 MG TABS Take 99 mg by mouth daily.    [provider]  pyridOXINE (VITAMIN B-6) 50 MG tablet Take 50 mg by mouth daily.    [provider]  ranolazine (RANEXA) 1000 MG SR tablet TAKE 1 TABLET(1000 MG) BY MOUTH TWICE DAILY 03/05/21   Lennette Bihari, MD  tamsulosin (FLOMAX) 0.4 MG CAPS capsule TAKE 1 CAPSULE(0.4 MG) BY MOUTH DAILY 07/10/21   McGowan, Carollee Herter A, PA-C  vitamin C (ASCORBIC ACID) 500 MG tablet Take 500 mg by mouth daily.    [provider]  vitamin E 400 UNIT capsule Take 400 Units by mouth daily.    [provider]     Allergies:   No Known Allergies  Social History:   Social History   Socioeconomic History   Marital status: Married    Spouse name: Not on file   Number of children: Not on file   Years of education: Not on file   Highest education level: Not on file  Occupational History   Not on file  Tobacco Use   Smoking status: Never   Smokeless tobacco: Never  Substance and Sexual Activity   Alcohol use: No   Drug use: No   Sexual activity: Not on file  Other Topics Concern   Not on file  Social History Narrative   Not on file   Social Determinants of Health   Financial Resource Strain: Not on file  Food Insecurity: Not on file  Transportation Needs: Not on file  Physical Activity: Not on file  Stress: Not on file  Social Connections: Not on file  Intimate Partner Violence: Not  on file    Family History:   The patient's family history includes Heart attack in his father; Heart disease in his father.    ROS:  Please see the history of present illness.  Limited, obtained mainly from his wife.  All other ROS reviewed and negative.     Physical Exam/Data:   Vitals:   07/14/21 1104 07/14/21 1115  BP: (!) 91/28 (!) 75/55  Pulse: (!) 29 (!) 49  Resp: 11 (!) 23  Temp: 98 F (36.7 C)   TempSrc: Oral   SpO2: 100% 98%   No intake or output data in the 24 hours ending 07/14/21 1142 Last 3 Weights 03/29/2021 03/06/2021 02/13/2021  Weight (lbs) 203 lb 201 lb 200 lb  Weight (kg) 92.08 kg 91.173 kg 90.719 kg     There is no height or weight on file to calculate BMI.  General:  Well nourished, well developed, in no acute distress, moderately sedated HEENT: normal Lymph: no adenopathy Neck: no JVD Endocrine:  No thryomegaly Vascular: No carotid bruits; FA pulses 2+ bilaterally without bruits  Cardiac:  normal S1, S2; RRR; no murmur, marked bradycardia. Lungs:  clear to auscultation bilaterally, no wheezing, rhonchi or rales  Abd: soft, nontender, no hepatomegaly  Ext: no edema, normal radial and pedal pulses, very slow.  Extensive bruising on the inner right thigh, left inner ankle, left elbow, but without active bleeding or hematoma Musculoskeletal:  No deformities, BUE and BLE strength normal and equal Skin: warm and dry  Neuro:  CNs 2-12 intact, no focal abnormalities noted Psych:  Normal affect    EKG:  The ECG that was done today was personally reviewed and demonstrates sinus rhythm with complete heart block, idioventricular rhythm with a right bundle branch block/left posterior fascicular block morphology  Relevant CV Studies:  Nuclear stress test 10/19/2019  The left ventricular ejection fraction is normal (55-65%). Nuclear stress EF: 59%. There was no ST segment deviation noted during stress. Defect 1: There is a medium defect of mild severity present  in the basal inferior and mid inferior location. Findings consistent with ischemia. This is a low risk study.   Abnormal, low risk stress nuclear  study with mild inferior ischemia.  Gated ejection fraction 59% with normal wall motion.     Cardiac catheterization 03/21/2018  Previously placed Prox LAD to Mid LAD stent (unknown type) is widely patent. The left ventricular systolic function is normal. LV end diastolic pressure is normal. The left ventricular ejection fraction is 50-55% by visual estimate. There is no mitral valve regurgitation.   Preserved global LV contractility with a small region of very mild residual mid anterolateral hypocontractility.  Ejection fraction 50-55%.   No significant coronary obstructive disease with evidence for widely patent mid LAD stent after the takeoff of a first diagonal vessel, normal left circumflex and normal dominant RCA.   RECOMMENDATION: Medical therapy.  Continue DAPT in this patient status post anterior ST segment MI August 2018.  Optimize blood pressure.  Continue high potency statin therapy.    Diagnostic Dominance: Right  Left Anterior Descending  Previously placed Prox LAD to Mid LAD stent (unknown type) is widely patent.    Wall Motion              Left Heart  Left Ventricle The left ventricular size is normal. The left ventricular systolic function is normal. LV end diastolic pressure is normal. The left ventricular ejection fraction is 50-55% by visual estimate. No regional wall motion abnormalities. There is no evidence of mitral regurgitation. There is preserved global LV function with a small region of very mild residual anterolateral hypo-contractility. Ejection fraction is 50-55%; LVEDP 15 mm Hg.   Coronary Diagrams   Diagnostic Dominance: Right     Laboratory Data:  High Sensitivity Troponin:  No results for input(s): TROPONINIHS in the last 720 hours.    Chemistry Recent Labs  Lab 07/14/21 1122  NA  140  K 4.9    No results for input(s): PROT, ALBUMIN, AST, ALT, ALKPHOS, BILITOT in the last 168 hours. Hematology Recent Labs  Lab 07/14/21 1108 07/14/21 1122  WBC 10.5  --   RBC 3.74*  --   HGB 12.2* 11.2*  HCT 35.2* 33.0*  MCV 94.1  --   MCH 32.6  --   MCHC 34.7  --   RDW 12.2  --   PLT 174  --    BNPNo results for input(s): BNP, PROBNP in the last 168 hours.  DDimer No results for input(s): DDIMER in the last 168 hours.   Radiology/Studies:  No results found.   Assessment and Plan:   CHB: Suspect primary conduction system disease, although he did have transient complete heart block at the time of his additional anterior STEMI in 2018.  No response to atropine.  Has a longstanding right bundle branch block.  Receiving a temporary transvenous pacemaker wire and will likely need a permanent pacemaker.  Would like to get an echocardiogram to reassess LV function before implanting the device.  Ideally,  he will get a His bundle lead.  We will check lab tests for reversible causes of heart block, but I doubt that this is the case considering the repetitive nature of his complaint this year. CAD: No angina in years. Anterior STEMI treated with emergency LAD stenting in 2018, without any recurrent coronary events since.  Most recent evaluation was a (near) normal nuclear stress test in October 2020.  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 DM/HLP: Excellent glycemic control and LDL cholesterol covered medical management.  Continue statin   Risk Assessment/Risk Scores:  Severity of Illness: The appropriate patient status for this patient is INPATIENT. Inpatient status is judged to be reasonable and necessary in order to provide the required intensity of service to ensure the patient's safety. The patient's presenting symptoms, physical exam findings, and initial radiographic and laboratory  data in the context of their chronic comorbidities is felt to place them at high risk for further clinical deterioration. Furthermore, it is not anticipated that the patient will be medically stable for discharge from the hospital within 2 midnights of admission. The following factors support the patient status of inpatient.   " The patient's presenting symptoms include syncope. " The worrisome physical exam findings include complete heart block. " The initial radiographic and laboratory data are worrisome because of complete heart block with severe bradycardia. " The chronic co-morbidities include CAD, diabetes mellitus type 2, hypertension, hyperlipidemia.   * I certify that at the point of admission it is my clinical judgment that the patient will require inpatient hospital care spanning beyond 2 midnights from the point of admission due to high intensity of service, high risk for further deterioration and high frequency of surveillance required.*   For questions or updates, please contact [facility] Please consult [email or web address] for contact info under     Signed, Thurmon Fair, MD  07/14/2021 11:42 AM

## 2021-07-14 NOTE — Progress Notes (Signed)
Orthopedic Tech Progress Note Patient Details:  Patrick [name] 1956/03/08 [id number]  RN called requesting a KNEE IMMOBILIZER for patient due to procedure patient recently had.Marland Kitchen applied with wife at bedside  Ortho Devices Type of Ortho Device: Knee Immobilizer Ortho Device/Splint Location: RLE Ortho Device/Splint Interventions: Application, Adjustment   Post Interventions Patient Tolerated: Well Instructions Provided: Care of device  Donald Pore 07/14/2021, 4:28 PM

## 2021-07-14 NOTE — ED Notes (Signed)
Pt transferred to cath lab with 2 RN and cath staff

## 2021-07-14 NOTE — ED Notes (Signed)
Cards at bedside. Leaving to transport to cath lab

## 2021-07-14 NOTE — ED Notes (Signed)
Cardiology paged at 11:12

## 2021-07-14 NOTE — ED Provider Notes (Signed)
[facility] EMERGENCY DEPARTMENT Provider Note   CSN: [id number] Arrival date & time: 07/14/21  1055     History Chief Complaint  Patient presents with   Bradycardia    Patrick [name] is a 65 y.o. male.  HPI He arrives by EMS, from work where he felt near syncopal.  He was found to be bradycardic, and hypotensive.  He states his mouth is dry because he needs something to drink.  Level 5 caveat-high acuity    Past Medical History:  Diagnosis Date   CAD in native artery    a. anterolateral STEMI 07/2017 s/p DES to prox-mid LAD, EF 55%.   GERD (gastroesophageal reflux disease)    Hyperlipidemia    Hypertension    Overactive bladder    Renal calculi    Uncontrolled diabetes mellitus [facility])     Patient Active Problem List   Diagnosis Date Noted   Heart block AV complete (HCC) 07/14/2021   Abnormal nuclear stress test    CAD in native artery 08/06/2017   Essential hypertension 08/06/2017   Hypokalemia 08/06/2017   ST elevation myocardial infarction involving left anterior descending (LAD) coronary artery (HCC)    Type 2 diabetes mellitus with complication, without long-term current use of insulin (HCC)    Hyperlipidemia LDL goal <70    Hyperlipidemia associated with type 2 diabetes mellitus (HCC) 07/29/2014   Hypertensive kidney disease with CKD stage III (HCC) 07/29/2014   UI (urinary incontinence) 07/29/2014    Past Surgical History:  Procedure Laterality Date   CORONARY/GRAFT ACUTE MI REVASCULARIZATION N/A 08/04/2017   Procedure: Coronary/Graft Acute MI Revascularization;  Surgeon: Lennette Bihari, MD;  Location: [facility] INVASIVE CV LAB;  Service: Cardiovascular;  Laterality: N/A;   LEFT HEART CATH AND CORONARY ANGIOGRAPHY N/A 08/04/2017   Procedure: LEFT HEART CATH AND CORONARY ANGIOGRAPHY;  Surgeon: Lennette Bihari, MD;  Location: [facility] INVASIVE CV LAB;  Service: Cardiovascular;  Laterality: N/A;   LEFT HEART CATH AND CORONARY ANGIOGRAPHY N/A 03/21/2018    Procedure: LEFT HEART CATH AND CORONARY ANGIOGRAPHY;  Surgeon: Lennette Bihari, MD;  Location: [facility] INVASIVE CV LAB;  Service: Cardiovascular;  Laterality: N/A;   PROSTATE SURGERY         Family History  Problem Relation Age of Onset   Heart disease Father    Heart attack Father     Social History   Tobacco Use   Smoking status: Never   Smokeless tobacco: Never  Substance Use Topics   Alcohol use: No   Drug use: No    Home Medications Prior to Admission medications   Medication Sig Start Date End Date Taking? Authorizing Provider  AMBULATORY NON FORMULARY MEDICATION Trimix (30/2/20)-(Pap/Phent/PGE)  Dose  Inject 0.7 mcg per injection  Vial 52ml  Qty #3 Refills 0  [facility] [phone] Fax [phone] 03/19/21   Michiel Cowboy A, PA-C  amLODipine (NORVASC) 10 MG tablet TAKE 1 TABLET(10 MG) BY MOUTH DAILY 04/18/21   Hilty, Lisette Abu, MD  aspirin (ASPIRIN LOW DOSE) 81 MG EC tablet Take 1 tablet (81 mg total) by mouth daily. 11/01/18   Lennette Bihari, MD  atorvastatin (LIPITOR) 40 MG tablet TAKE 1 TABLET(40 MG) BY MOUTH EVERY EVENING 01/03/19   Lennette Bihari, MD  BRILINTA 60 MG TABS tablet TAKE 1 TABLET BY MOUTH TWICE DAILY 11/15/20   Lennette Bihari, MD  Coenzyme Q10 200 MG capsule Take 200 mg by mouth daily.    [provider]  glimepiride (  AMARYL) 2 MG tablet Take 2 mg by mouth daily with breakfast.    [provider]  Glucosamine-Chondroit-Vit C-Mn (GLUCOSAMINE 1500 COMPLEX PO) Take 1,500 mg by mouth daily.    [provider]  isosorbide mononitrate (IMDUR) 60 MG 24 hr tablet TAKE 1 AND 1/2 TABLETS(90 MG) BY MOUTH DAILY 03/31/21   Lennette Bihari, MD  losartan (COZAAR) 100 MG tablet TAKE 1 TABLET(100 MG) BY MOUTH DAILY 05/17/20   Lennette Bihari, MD  Magnesium 250 MG TABS Take 250 mg by mouth daily.    [provider]  Magnesium Citrate 100 MG TABS Take 100 mg by mouth daily.    [provider]  metFORMIN  (GLUCOPHAGE-XR) 500 MG 24 hr tablet Take 1,000 mg by mouth 2 (two) times daily. 02/25/21   [provider]  metoprolol tartrate (LOPRESSOR) 50 MG tablet TAKE 1 TABLET(50 MG) BY MOUTH TWICE DAILY 04/28/21   Lennette Bihari, MD  mirabegron ER (MYRBETRIQ) 25 MG TB24 tablet TAKE 1 TABLET(25 MG) BY MOUTH DAILY 10/15/20   McGowan, Carollee Herter A, PA-C  Multiple Vitamins-Minerals (ABC PLUS SENIOR PO) Take 1 tablet by mouth daily.    [provider]  nitroGLYCERIN (NITROSTAT) 0.4 MG SL tablet Place 1 tablet (0.4 mg total) under the tongue every 5 (five) minutes x 3 doses as needed for chest pain. 10/11/19   Lennette Bihari, MD  Omega-3 Fatty Acids (FISH OIL OMEGA-3 PO) Take 2 capsules by mouth 2 (two) times daily.     [provider]  pantoprazole (PROTONIX) 40 MG tablet Take 40 mg by mouth 2 (two) times daily.    [provider]  Potassium 99 MG TABS Take 99 mg by mouth daily.    [provider]  pyridOXINE (VITAMIN B-6) 50 MG tablet Take 50 mg by mouth daily.    [provider]  ranolazine (RANEXA) 1000 MG SR tablet TAKE 1 TABLET(1000 MG) BY MOUTH TWICE DAILY 03/05/21   Lennette Bihari, MD  tamsulosin (FLOMAX) 0.4 MG CAPS capsule TAKE 1 CAPSULE(0.4 MG) BY MOUTH DAILY 07/10/21   McGowan, Carollee Herter A, PA-C  vitamin C (ASCORBIC ACID) 500 MG tablet Take 500 mg by mouth daily.    [provider]  vitamin E 400 UNIT capsule Take 400 Units by mouth daily.    [provider]    Allergies    Patient has no known allergies.  Review of Systems   Review of Systems  Unable to perform ROS: Acuity of condition   Physical Exam Updated Vital Signs BP (!) 75/55   Pulse (!) 49   Temp 98 F (36.7 C) (Oral)   Resp (!) 23   SpO2 98%   Physical Exam Vitals and nursing note reviewed.  Constitutional:      Appearance: He is well-developed. He is not ill-appearing.  HENT:     Head: Normocephalic and atraumatic.     Right Ear: External ear normal.      Left Ear: External ear normal.  Eyes:     Conjunctiva/sclera: Conjunctivae normal.     Pupils: Pupils are equal, round, and reactive to light.  Neck:     Trachea: Phonation normal.  Cardiovascular:     Rate and Rhythm: Regular rhythm. Bradycardia present.     Heart sounds: Normal heart sounds.  Pulmonary:     Effort: Pulmonary effort is normal. No respiratory distress.     Breath sounds: Normal breath sounds. No stridor.  Abdominal:     Palpations:  Abdomen is soft.     Tenderness: There is no abdominal tenderness.  Musculoskeletal:        General: Normal range of motion.     Cervical back: Normal range of motion and neck supple.  Skin:    General: Skin is warm and dry.  Neurological:     Mental Status: He is alert and oriented to person, place, and time.     Cranial Nerves: No cranial nerve deficit.     Sensory: No sensory deficit.     Motor: No abnormal muscle tone.     Coordination: Coordination normal.     Comments: He is dysarthric, cause is not clear.  Psychiatric:        Mood and Affect: Mood normal.        Behavior: Behavior normal.        Thought Content: Thought content normal.        Judgment: Judgment normal.    ED Results / Procedures / Treatments   Labs (all labs ordered are listed, but only abnormal results are displayed) Labs Reviewed  CBC WITH DIFFERENTIAL/PLATELET - Abnormal; Notable for the following components:      Result Value   RBC 3.74 (*)    Hemoglobin 12.2 (*)    HCT 35.2 (*)    Neutro Abs 8.0 (*)    All other components within normal limits  POCT I-STAT EG7 - Abnormal; Notable for the following components:   pCO2, Ven 34.6 (*)    pO2, Ven 64.0 (*)    Acid-base deficit 3.0 (*)    Calcium, Ion 1.07 (*)    HCT 33.0 (*)    Hemoglobin 11.2 (*)    All other components within normal limits  RESP PANEL BY RT-PCR (FLU A&B, COVID) ARPGX2  COMPREHENSIVE METABOLIC PANEL  COMPREHENSIVE METABOLIC PANEL  LIPID PANEL  HEMOGLOBIN A1C  CBC   PROTIME-INR  APTT  I-STAT VENOUS BLOOD GAS, ED  TROPONIN I (HIGH SENSITIVITY)  TROPONIN I (HIGH SENSITIVITY)    EKG EKG Interpretation  Date/Time:  Monday July 14 2021 11:02:56 EDT Ventricular Rate:  29 PR Interval:  379 QRS Duration: 162 QT Interval:  586 QTC Calculation: 407 R Axis:   92 Text Interpretation: Complete heart block RBBB and LPFB Since last tracing rate slower Confirmed by Mancel Bale [phone]) on 07/14/2021 11:22:07 AM  Radiology No results found.  Procedures .Critical Care  Date/Time: 07/14/2021 11:45 AM Performed by: Mancel Bale, MD Authorized by: Mancel Bale, MD   Critical care provider statement:    Critical care time (minutes):  35   Critical care start time:  07/14/2021 11:00 AM   Critical care end time:  07/14/2021 11:46 AM   Critical care time was exclusive of:  Separately billable procedures and treating other patients   Critical care was necessary to treat or prevent imminent or life-threatening deterioration of the following conditions:  Cardiac failure   Critical care was time spent personally by me on the following activities:  Blood draw for specimens, development of treatment plan with patient or surrogate, discussions with consultants, evaluation of patient's response to treatment, examination of patient, obtaining history from patient or surrogate, ordering and performing treatments and interventions, ordering and review of laboratory studies, pulse oximetry, re-evaluation of patient's condition, review of old charts and ordering and review of radiographic studies   Medications Ordered in ED Medications  0.9 %  sodium chloride infusion ( Intravenous New Bag/Given 07/14/21 1112)  atropine injection (1 mg Intravenous Given 07/14/21  1117)  atropine 1 MG/10ML injection (1 mg Intravenous Given 07/14/21 1126)  lidocaine (PF) (XYLOCAINE) 1 % injection (10 mLs  Given 07/14/21 1139)  LORazepam (ATIVAN) 2 MG/ML injection (  Given 07/14/21 1109)   fentaNYL (SUBLIMAZE) injection ( Intravenous Canceled Entry 07/14/21 1115)    ED Course  I have reviewed the triage vital signs and the nursing notes.  Pertinent labs & imaging results that were available during my care of the patient were reviewed by me and considered in my medical decision making (see chart for details).  Clinical Course as of 07/14/21 1147  Mon Jul 14, 2021  1104 Pacer pads been placed [EW]  1120 Patient transferred to cardiac Cath Lab.  Dr. Royann Shiversroitoru with the patient this time. [EW]    Clinical Course User Index [EW] Mancel BaleWentz, Nelson Noone, MD   MDM Rules/Calculators/A&P                            Patient Vitals for the past 24 hrs:  BP Temp Temp src Pulse Resp SpO2  07/14/21 1115 (!) 75/55 -- -- (!) 49 (!) 23 98 %  07/14/21 1104 (!) 91/28 98 F (36.7 C) Oral (!) 29 11 100 %      Medical Decision Making:  This patient is presenting for evaluation of abrupt onset of bradycardia, which does require a range of treatment options, and is a complaint that involves a high risk of morbidity and mortality. The differential diagnoses include acute coronary syndrome, metabolic disorder, medication complication. I decided to review old records, and in summary elderly male presenting with altered mental status and bradycardia, currently taking metoprolol.  He has a history of cardiac disease and intervention.  I did not require additional historical information from anyone.  Clinical Laboratory Tests Ordered, included CBC, Metabolic panel, and blood gas, troponin . Review indicates initial findings, reassuring.  Hemoglobin slightly low at 12.2, venous blood gas, with PO2 low but normal pH.   Cardiac Monitor Tracing which shows complete heart block    After These Interventions, the Patient was reevaluated and was found with complete heart block requiring emergency temporary pacer placed in the cardiac Cath Lab.  Patient left the ED, to go to the Cath Lab, alert, with heart  rate in the mid 20s.  CRITICAL CARE-yes Performed by: Mancel BaleElliott Henryetta Corriveau  Nursing Notes Reviewed/ Care Coordinated Applicable Imaging Reviewed Interpretation of Laboratory Data incorporated into ED treatment  Disposition per cardiology    Final Clinical Impression(s) / ED Diagnoses Final diagnoses:  Heart block AV complete [facility](HCC)    Rx / DC Orders ED Discharge Orders     None        Mancel BaleWentz, Amaiyah Nordhoff, MD 07/14/21 1147

## 2021-07-14 NOTE — Progress Notes (Signed)
Echocardiogram 2D Echocardiogram has been performed.  Warren Lacy Tahari Clabaugh RDCS 07/14/2021, 3:25 PM  Dr. Royann Shivers notified of stat

## 2021-07-14 NOTE — Code Documentation (Signed)
100mcg fentanyl given

## 2021-07-15 ENCOUNTER — Encounter (HOSPITAL_COMMUNITY)
Admission: EM | Disposition: A | Discharge status: Home / Self Care | Payer: Self-pay | Source: Home / Self Care | Attending: Cardiology

## 2021-07-15 ENCOUNTER — Inpatient Hospital Stay (HOSPITAL_COMMUNITY): Payer: Medicare Other

## 2021-07-15 DIAGNOSIS — I442 Atrioventricular block, complete: Secondary | ICD-10-CM | POA: Diagnosis not present

## 2021-07-15 LAB — BASIC METABOLIC PANEL
Anion gap: 9 (ref 5–15)
BUN: 16 mg/dL (ref 8–23)
CO2: 22 mmol/L (ref 22–32)
Calcium: 8.5 mg/dL — ABNORMAL LOW (ref 8.9–10.3)
Chloride: 109 mmol/L (ref 98–111)
Creatinine, Ser: 1.06 mg/dL (ref 0.61–1.24)
GFR, Estimated: >60 mL/min (ref >60)
Glucose, Bld: 110 mg/dL — ABNORMAL HIGH (ref 70–99)
Potassium: 3.5 mmol/L (ref 3.5–5.1)
Sodium: 140 mmol/L (ref 135–145)

## 2021-07-15 LAB — CBC
HCT: 38.8 % — ABNORMAL LOW (ref 39.0–52.0)
Hemoglobin: 13.5 g/dL (ref 13.0–17.0)
MCH: 31.9 pg (ref 26.0–34.0)
MCHC: 34.8 g/dL (ref 30.0–36.0)
MCV: 91.7 fL (ref 80.0–100.0)
Platelets: 168 10*3/uL (ref 150–400)
RBC: 4.23 MIL/uL (ref 4.22–5.81)
RDW: 12.2 % (ref 11.5–15.5)
WBC: 10.2 10*3/uL (ref 4.0–10.5)
nRBC: 0 % (ref 0.0–0.2)

## 2021-07-15 LAB — GLUCOSE, CAPILLARY
Glucose-Capillary: 113 mg/dL — ABNORMAL HIGH (ref 70–99)
Glucose-Capillary: 162 mg/dL — ABNORMAL HIGH (ref 70–99)
Glucose-Capillary: 98 mg/dL (ref 70–99)
Glucose-Capillary: 125 mg/dL — ABNORMAL HIGH (ref 70–99)
Glucose-Capillary: 65 mg/dL — ABNORMAL LOW (ref 70–99)

## 2021-07-15 LAB — HEMOGLOBIN A1C
Hgb A1c MFr Bld: 5.9 % — ABNORMAL HIGH (ref 4.8–5.6)
Mean Plasma Glucose: 123 mg/dL

## 2021-07-15 IMAGING — DX DG CHEST 1V PORT
1 series · 1 of 1 positions shown · non-contrast
Comparison: [DATE].

CLINICAL DATA: Sore chest.  Prior temporary pacer.

EXAM:
PORTABLE CHEST 1 VIEW

[chest ap]
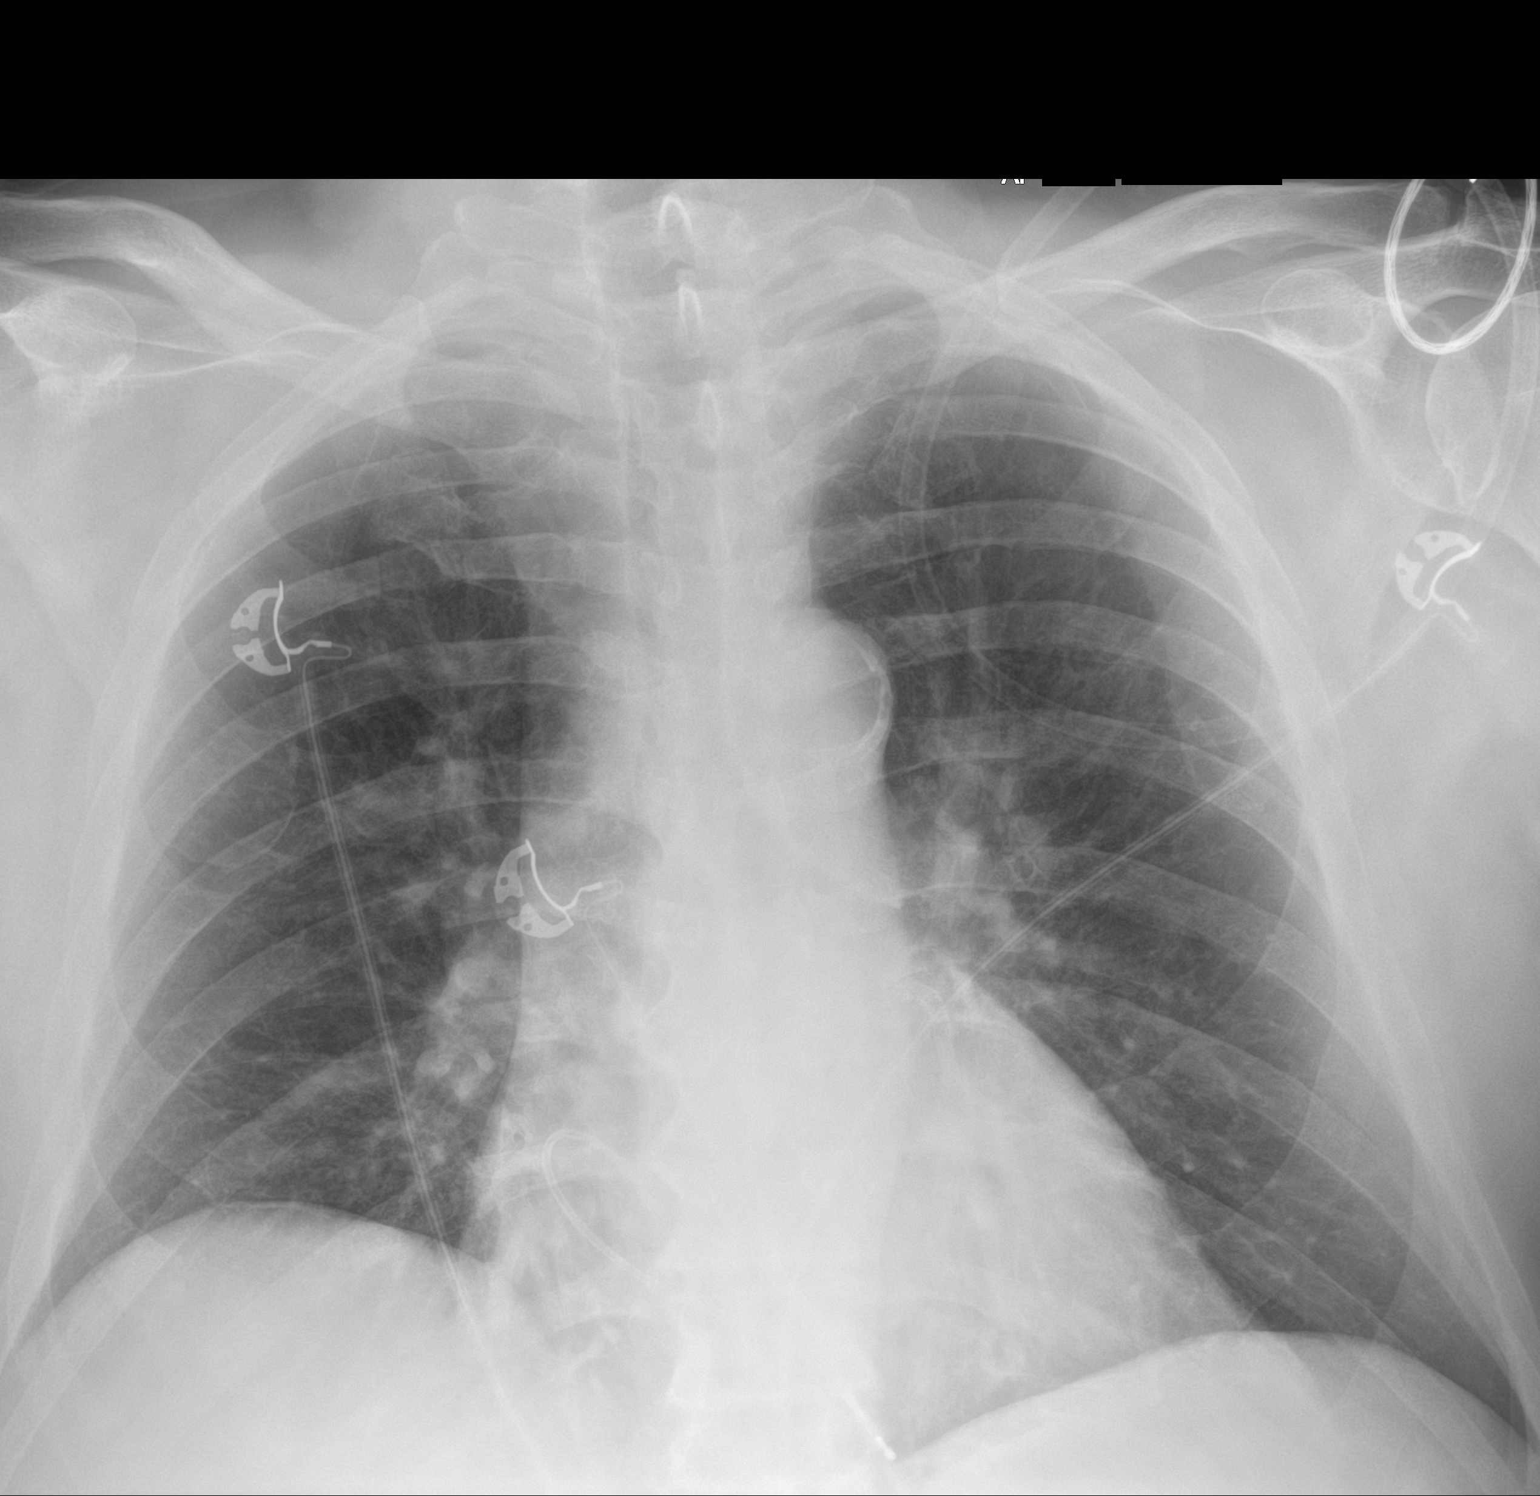

[1 of 1 positions shown; findings below may reference images not displayed]

FINDINGS: Cardiomegaly with mild pulmonary venous congestion. Low lung
volumes. Interim clearing of bilateral pulmonary infiltrates/edema.
No focal infiltrate. No pleural effusion or pneumothorax.
Degenerative change thoracic spine.
IMPRESSION: 1.  Cardiomegaly with mild pulmonary venous congestion.

2.  Interim clearing of bilateral pulmonary infiltrates/edema.

## 2021-07-15 SURGERY — PACEMAKER IMPLANT

## 2021-07-15 MED ORDER — TAMSULOSIN HCL 0.4 MG PO CAPS: 0.4000 mg | ORAL_CAPSULE | Freq: Every day | ORAL | Status: DC

## 2021-07-15 MED ORDER — TAMSULOSIN HCL 0.4 MG PO CAPS
0.4000 mg | ORAL_CAPSULE | Freq: Every day | ORAL | Status: DC
  Administered 2021-07-15: 0.4 mg via ORAL
  Filled 2021-07-15: qty 1

## 2021-07-15 NOTE — Progress Notes (Signed)
Progress Note  Patient Name: Patrick [name] Date of Encounter: 07/15/2021  [facility] Cardiologist: Nicki Guadalajara, MD   Subjective   Markedly improved mentation and oxygenation. Alert, oriented. Had occasional intermittent pacing overnight. Currently SR w 1st deg AV block and RBBB on telemetry  Inpatient Medications    Scheduled Meds:  atorvastatin  40 mg Oral q1800   Chlorhexidine Gluconate Cloth  6 each Topical Daily   glimepiride  2 mg Oral Q breakfast   heparin  5,000 Units Subcutaneous Q8H   insulin aspart  0-15 Units Subcutaneous TID WC   isosorbide mononitrate  90 mg Oral Daily   mouth rinse  15 mL Mouth Rinse BID   pantoprazole  40 mg Oral BID   ranolazine  1,000 mg Oral BID   sodium chloride flush  3 mL Intravenous Q12H   tamsulosin  0.4 mg Oral QPC breakfast   Continuous Infusions:  sodium chloride 75 mL/hr at 07/15/21 0700   sodium chloride 10 mL/hr at 07/15/21 0700   PRN Meds: sodium chloride, acetaminophen, atropine, ondansetron (ZOFRAN) IV, sodium chloride flush   Vital Signs    Vitals:   07/15/21 0500 07/15/21 0600 07/15/21 0700 07/15/21 0737  BP: 134/77 128/73 (!) 148/81   Pulse:      Resp: 18 (!) 23 17   Temp:    98.3 F (36.8 C)  TempSrc:    Oral  SpO2: 98% 99% 96%   Weight:      Height:        Intake/Output Summary (Last 24 hours) at 07/15/2021 0838 Last data filed at 07/15/2021 0700 Gross per 24 hour  Intake 1854.41 ml  Output 5300 ml  Net -3445.59 ml   Last 3 Weights 07/14/2021 03/29/2021 03/06/2021  Weight (lbs) 210 lb 8.6 oz 203 lb 201 lb  Weight (kg) 95.5 kg 92.08 kg 91.173 kg      Telemetry    SR w 1:1 AV conduction; rare V pacing overnight - Personally Reviewed  ECG    No new tracing - Personally Reviewed  Physical Exam  Appears well. R FA TPV and knee immobilizer GEN: No acute distress.   Neck: No JVD Cardiac: RRR, no murmurs, rubs, or gallops.  Respiratory: Clear to auscultation bilaterally. GI: Soft, nontender,  non-distended  MS: No edema; No deformity. Neuro:  Nonfocal  Psych: Normal affect   Labs    High Sensitivity Troponin:   Recent Labs  Lab 07/14/21 1108 07/14/21 1159 07/14/21 1418  TROPONINIHS 3 3 118*      Chemistry Recent Labs  Lab 07/14/21 1108 07/14/21 1122 07/14/21 1159 07/15/21 0157  NA 136 140 137 140  K 4.9 4.9 5.5* 3.5  CL 108  --  110 109  CO2 21*  --  22 22  GLUCOSE 224*  --  215* 110*  BUN 24*  --  23 16  CREATININE 1.43*  --  1.49* 1.06  CALCIUM 8.0*  --  7.6* 8.5*  PROT 5.3*  --  5.2*  --   ALBUMIN 3.1*  --  3.0*  --   AST 21  --  18  --   ALT 21  --  22  --   ALKPHOS 31*  --  29*  --   BILITOT 0.9  --  1.1  --   GFRNONAA 54*  --  51* >60  ANIONGAP 7  --  5 9     Hematology Recent Labs  Lab 07/14/21 1108 07/14/21 1122 07/14/21 1159  07/15/21 0157  WBC 10.5  --  13.4* 10.2  RBC 3.74*  --  3.57* 4.23  HGB 12.2* 11.2* 11.4* 13.5  HCT 35.2* 33.0* 33.5* 38.8*  MCV 94.1  --  93.8 91.7  MCH 32.6  --  31.9 31.9  MCHC 34.7  --  34.0 34.8  RDW 12.2  --  12.1 12.2  PLT 174  --  168 168    BNPNo results for input(s): BNP, PROBNP in the last 168 hours.   DDimer No results for input(s): DDIMER in the last 168 hours.   Radiology    CARDIAC CATHETERIZATION  Result Date: 07/14/2021 Successful placement of Temporary Transvenous Pacemaker via 6 French RV access -> rate set at 80 bpm, 5 mA (Threshold: 0.48mA; inserted to 75 cm at hub)   DG Chest Port 1 View  Result Date: 07/15/2021 CLINICAL DATA:  Sore chest.  Prior temporary pacer. EXAM: PORTABLE CHEST 1 VIEW COMPARISON:  07/14/2021. FINDINGS: Cardiomegaly with mild pulmonary venous congestion. Low lung volumes. Interim clearing of bilateral pulmonary infiltrates/edema. No focal infiltrate. No pleural effusion or pneumothorax. Degenerative change thoracic spine. IMPRESSION: 1.  Cardiomegaly with mild pulmonary venous congestion. 2.  Interim clearing of bilateral pulmonary infiltrates/edema.  Electronically Signed   By: Maisie Fus  Register   On: 07/15/2021 07:32   DG Chest Port 1 View  Result Date: 07/14/2021 CLINICAL DATA:  Chest pain, bradycardia and hypotension this morning. EXAM: PORTABLE CHEST 1 VIEW COMPARISON:  PA and lateral chest 08/04/2017. FINDINGS: Small patchy focus of airspace opacity is seen in the right lung base. Lungs otherwise clear. Heart size normal. Aortic atherosclerosis. Defibrillator pad is in place. No acute or focal bony abnormality. IMPRESSION: Small focus of right basilar airspace opacity is likely due to atelectasis. Aortic Atherosclerosis (ICD10-I70.0). Electronically Signed   By: Drusilla Kanner M.D.   On: 07/14/2021 13:46   ECHOCARDIOGRAM COMPLETE  Result Date: 07/14/2021    ECHOCARDIOGRAM REPORT   Patient Name:   Patrick [name] Date of Exam: 07/14/2021 Medical Rec #:  [id number]    Height:       72.0 in Accession #:    [id number]   Weight:       203.0 lb Date of Birth:  04-28-1956    BSA:          2.144 m Patient Age:    65 years     BP:           136/78 mmHg Patient Gender: M            HR:           80 bpm. Exam Location:  Inpatient Procedure: 2D Echo, Color Doppler and Cardiac Doppler STAT ECHO Indications:    I44.2 Complete heart block  History:        Patient has no prior history of Echocardiogram examinations.                 CAD; Risk Factors:Hypertension, Diabetes and Dyslipidemia.  Sonographer:    Irving Burton Senior RDCS Referring Phys: [id number] ANGELA NICOLE DUKE IMPRESSIONS  1. AV dissociation precludes detailed analysis of diastolic function, but annular tissue Doppler diastolic veloicites are normal for age. Left ventricular ejection fraction, by estimation, is 60 to 65%. The left ventricle has normal function. The left ventricle has no regional wall motion abnormalities. Left ventricular diastolic function could not be evaluated.  2. Right ventricular systolic function is normal. The right ventricular size is normal.  3. Left atrial size was mildly  dilated.  4.  The mitral valve is normal in structure. No evidence of mitral valve regurgitation. No evidence of mitral stenosis.  5. The aortic valve is normal in structure. Aortic valve regurgitation is not visualized. No aortic stenosis is present.  6. The inferior vena cava is normal in size with greater than 50% respiratory variability, suggesting right atrial pressure of 3 mmHg. FINDINGS  Left Ventricle: AV dissociation precludes detailed analysis of diastolic function, but annular tissue Doppler diastolic veloicites are normal for age. Left ventricular ejection fraction, by estimation, is 60 to 65%. The left ventricle has normal function. The left ventricle has no regional wall motion abnormalities. The left ventricular internal cavity size was normal in size. There is no left ventricular hypertrophy. Abnormal (paradoxical) septal motion, consistent with RV pacemaker. Left ventricular diastolic function could not be evaluated due to nondiagnostic images. Left ventricular diastolic function could not be evaluated. Right Ventricle: The right ventricular size is normal. No increase in right ventricular wall thickness. Right ventricular systolic function is normal. Left Atrium: Left atrial size was mildly dilated. Right Atrium: Right atrial size was normal in size. Pericardium: There is no evidence of pericardial effusion. Mitral Valve: The mitral valve is normal in structure. No evidence of mitral valve regurgitation. No evidence of mitral valve stenosis. Tricuspid Valve: The tricuspid valve is normal in structure. Tricuspid valve regurgitation is not demonstrated. No evidence of tricuspid stenosis. Aortic Valve: The aortic valve is normal in structure. Aortic valve regurgitation is not visualized. No aortic stenosis is present. Pulmonic Valve: The pulmonic valve was normal in structure. Pulmonic valve regurgitation is not visualized. No evidence of pulmonic stenosis. Aorta: The aortic root is normal in size and structure.  Venous: The inferior vena cava is normal in size with greater than 50% respiratory variability, suggesting right atrial pressure of 3 mmHg. IAS/Shunts: No atrial level shunt detected by color flow Doppler. Additional Comments: A device lead is visualized in the right ventricle.  LEFT VENTRICLE PLAX 2D LVIDd:         4.90 cm  Diastology LVIDs:         3.10 cm  LV e' medial:    5.87 cm/s LV PW:         0.90 cm  LV E/e' medial:  15.5 LV IVS:        0.90 cm  LV e' lateral:   11.10 cm/s LVOT diam:     2.10 cm  LV E/e' lateral: 8.2 LV SV:         63 LV SV Index:   29 LVOT Area:     3.46 cm  RIGHT VENTRICLE RV S prime:     14.10 cm/s TAPSE (M-mode): 1.8 cm LEFT ATRIUM             Index       RIGHT ATRIUM           Index LA diam:        3.50 cm 1.63 cm/m  RA Area:     11.90 cm LA Vol (A2C):   71.1 ml 33.16 ml/m RA Volume:   25.00 ml  11.66 ml/m LA Vol (A4C):   59.4 ml 27.70 ml/m LA Biplane Vol: 64.9 ml 30.27 ml/m  AORTIC VALVE LVOT Vmax:   93.30 cm/s LVOT Vmean:  73.200 cm/s LVOT VTI:    0.181 m  AORTA Ao Root diam: 3.30 cm Ao Asc diam:  3.20 cm MITRAL VALVE MV Area (PHT): 3.31 cm  SHUNTS MV Decel Time: 229 msec    Systemic VTI:  0.18 m MV E velocity: 90.80 cm/s  Systemic Diam: 2.10 cm MV A velocity: 56.10 cm/s MV E/A ratio:  1.62 Oshay Stranahan MD Electronically signed by Thurmon Fair MD Signature Date/Time: 07/14/2021/4:04:56 PM    Final     Cardiac Studies  Nuclear stress test 10/19/2019  The left ventricular ejection fraction is normal (55-65%). Nuclear stress EF: 59%. There was no ST segment deviation noted during stress. Defect 1: There is a medium defect of mild severity present in the basal inferior and mid inferior location. Findings consistent with ischemia. This is a low risk study.   Abnormal, low risk stress nuclear study with mild inferior ischemia.  Gated ejection fraction 59% with normal wall motion.       Cardiac catheterization 03/21/2018   Previously placed Prox LAD to Mid LAD  stent (unknown type) is widely patent. The left ventricular systolic function is normal. LV end diastolic pressure is normal. The left ventricular ejection fraction is 50-55% by visual estimate. There is no mitral valve regurgitation.   Preserved global LV contractility with a small region of very mild residual mid anterolateral hypocontractility.  Ejection fraction 50-55%.   No significant coronary obstructive disease with evidence for widely patent mid LAD stent after the takeoff of a first diagonal vessel, normal left circumflex and normal dominant RCA.   RECOMMENDATION: Medical therapy.  Continue DAPT in this patient status post anterior ST segment MI August 2018.  Optimize blood pressure.  Continue high potency statin therapy.     Diagnostic Dominance: Right   Left Anterior Descending  Previously placed Prox LAD to Mid LAD stent (unknown type) is widely patent.      Wall Motion                  Left Heart   Left Ventricle The left ventricular size is normal. The left ventricular systolic function is normal. LV end diastolic pressure is normal. The left ventricular ejection fraction is 50-55% by visual estimate. No regional wall motion abnormalities. There is no evidence of mitral regurgitation. There is preserved global LV function with a small region of very mild residual anterolateral hypo-contractility. Ejection fraction is 50-55%; LVEDP 15 mm Hg.    Coronary Diagrams     Diagnostic Dominance: Right       ECHO 07/14/2021   1. AV dissociation precludes detailed analysis of diastolic function, but annular tissue Doppler diastolic veloicites are normal for age. Left ventricular ejection fraction, by estimation, is 60 to 65%. The left ventricle has normal function. The left ventricle has no regional wall motion abnormalities. Left ventricular diastolic function could not be evaluated.   2. Right ventricular systolic function is normal. The right ventricular size is  normal.   3. Left atrial size was mildly dilated.   4. The mitral valve is normal in structure. No evidence of mitral valve regurgitation. No evidence of mitral stenosis.   5. The aortic valve is normal in structure. Aortic valve regurgitation is not visualized. No aortic stenosis is present.   6. The inferior vena cava is normal in size with greater than 50% respiratory variability, suggesting right atrial pressure of 3 mmHg.   Patient Profile     65 y.o. 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  No response to atropine.  Has a longstanding right bundle branch block.  Receiving a temporary transvenous pacemaker wire and will likely need a permanent pacemaker.  Echocardiogram reassuring with normal LVEF and wall motion and minimal increases in hsTnI (not unexpected with prolonged severe bradycardia and a period of transcutaneous pacing),  Ideally,  he will get a His bundle/LBBB lead. While metoprolol may have had a contributory role to AV block, it is unlikely to be the major cause and is a necessary medication with CAD. CAD: No angina in years. Anterior STEMI treated with emergency LAD stenting in 2018, without any recurrent coronary events since.  Most recent evaluation was a low-risk, (near) normal nuclear stress test in October 2020.  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 DM/HLP: Excellent glycemic control and LDL cholesterol covered medical management.  Continue statin  For  questions or updates, please contact [facility] Please consult [email or web address] for contact info under        Signed, Thurmon Fair, MD  07/15/2021, 8:38 AM

## 2021-07-15 NOTE — Consult Note (Addendum)
Cardiology Consultation:   Patient ID: BIRAN MAYBERRY MRN: [id number]; DOB: [birth year]  Admit date: 07/14/2021 Date of Consult: 07/15/2021  PCP:  Lauro Regulus, MD   [facility] Providers Cardiologist:  Nicki Guadalajara, MD        Patient Profile:   Patrick [name] is a 65 y.o. male with a hx of CAD (STEMI 2018 w/DES), HTN, HLD, DM who is being seen 07/15/2021 for the evaluation of CHB at the request of Dr. Royann Shivers.  History of Present Illness:   Patrick [name] has PMHx  in 2018 of an anterior wall STEMI underwent DES and by reports during which he had some CHB.  Since then he has done well, follows with Dr. Tresa Endo.  cardiac catheterization performed in April 2019 showed a patent stent in the proximal to mid LAD and normal left ventricular systolic function with a very small area of mid anterolateral hypocontractility, EF 50-55%.  No lesions were seen in the dominant right coronary artery.  A follow-up nuclear perfusion study in October 2020 a low restudy with a suggestion of mild mid-basal inferior wall ischemia, EF 59%.  (Dr. Erin Hearing consult from yesterday on his review favored diaphragmatic attenuation rather then ischemia)  He this year has started to have dizzy spells and syncope. April this year he developed dizzy feeling. Sense of feeing unusual, perhaps lightheaded, this lasted hours not changed with position/exertion went home from work and napped, woke feeling better  Some weeks later again had some dizzy feeling same as prior, napped, woke feeling about the same and while in the kitchen suddenly fainted, waking quickly as his wife was rushing in to see what happened.  Both followed by [facility] visits without any significant findings  A few weeks ago [place] began feeling again dizzy, after several trips from the car sat on the couch and reportedly his head slumped back and he was briefly snoring unusually, spoke to his PMD suspecting syncope and this  prompted a heart monitor that he wore for 3 days reportedly finding no abnormalities (not available for personal review).  He was admitted yesterday for recurrent syncope, was at work again started to feel dizzy this time with a sense of flushing through his chest sat to collect himself, this time seemingly somewhat reminiscent of his prior MI and again briefly fainted, reportedly seconds as told to the patient by coworkers. EMS was called (no record available)  In the ER found in CHB transcutaneously paced with V pausing 4-5 seconds, rates dipping to 20's and cardiology called, brought for emergent temp wire placement and admitted his home metoprolol held.  LABS K+ 4.9 > 4.9 > 5.5 > 3.5 BUN 24 >> 22 Creat 1.43 >> 1.06 HS Trop 3 > 118 WBC 13.4 > 10.2 H/H 13/38 Plts 168  He is feeling well this AM, no SOB, CXR is improved from yesterday  Past Medical History:  Diagnosis Date   CAD in native artery    a. anterolateral STEMI 07/2017 s/p DES to prox-mid LAD, EF 55%.   GERD (gastroesophageal reflux disease)    Hyperlipidemia    Hypertension    Overactive bladder    Renal calculi    Uncontrolled diabetes mellitus (HCC)     Past Surgical History:  Procedure Laterality Date   CORONARY/GRAFT ACUTE MI REVASCULARIZATION N/A 08/04/2017   Procedure: Coronary/Graft Acute MI Revascularization;  Surgeon: Lennette Bihari, MD;  Location: [facility] INVASIVE CV LAB;  Service: Cardiovascular;  Laterality: N/A;  LEFT HEART CATH AND CORONARY ANGIOGRAPHY N/A 08/04/2017   Procedure: LEFT HEART CATH AND CORONARY ANGIOGRAPHY;  Surgeon: Lennette Bihari, MD;  Location: [facility] INVASIVE CV LAB;  Service: Cardiovascular;  Laterality: N/A;   LEFT HEART CATH AND CORONARY ANGIOGRAPHY N/A 03/21/2018   Procedure: LEFT HEART CATH AND CORONARY ANGIOGRAPHY;  Surgeon: Lennette Bihari, MD;  Location: [facility] INVASIVE CV LAB;  Service: Cardiovascular;  Laterality: N/A;   PROSTATE SURGERY     TEMPORARY PACEMAKER N/A 07/14/2021   Procedure:  TEMPORARY PACEMAKER;  Surgeon: Marykay Lex, MD;  Location: [facility]VAS[facility] CV LAB;  Service: Cardiovascular;  Laterality: N/A;     Home Medications:  Prior to Admission medications   Medication Sig Start Date End Date Taking? Authorizing Provider  amLODipine (NORVASC) 10 MG tablet TAKE 1 TABLET(10 MG) BY MOUTH DAILY Patient taking differently: Take 10 mg by mouth daily. 04/18/21  Yes Hilty, Lisette Abu, MD  aspirin (ASPIRIN LOW DOSE) 81 MG EC tablet Take 1 tablet (81 mg total) by mouth daily. 11/01/18  Yes Lennette Bihari, MD  atorvastatin (LIPITOR) 40 MG tablet TAKE 1 TABLET(40 MG) BY MOUTH EVERY EVENING Patient taking differently: Take 40 mg by mouth daily. 01/03/19  Yes Lennette Bihari, MD  BRILINTA 60 MG TABS tablet TAKE 1 TABLET BY MOUTH TWICE DAILY Patient taking differently: Take 60 mg by mouth 2 (two) times daily. 11/15/20  Yes Lennette Bihari, MD  Coenzyme Q10 200 MG capsule Take 200 mg by mouth daily.   Yes [provider]  glimepiride (AMARYL) 2 MG tablet Take 2 mg by mouth daily with breakfast.   Yes [provider]  Glucosamine-Chondroit-Vit C-Mn (GLUCOSAMINE 1500 COMPLEX PO) Take 1,500 mg by mouth daily.   Yes [provider]  isosorbide mononitrate (IMDUR) 60 MG 24 hr tablet TAKE 1 AND 1/2 TABLETS(90 MG) BY MOUTH DAILY Patient taking differently: Take 90 mg by mouth 2 (two) times daily. 03/31/21  Yes Lennette Bihari, MD  losartan (COZAAR) 100 MG tablet TAKE 1 TABLET(100 MG) BY MOUTH DAILY Patient taking differently: Take 100 mg by mouth daily. 05/17/20  Yes Lennette Bihari, MD  Magnesium 250 MG TABS Take 250 mg by mouth daily.   Yes [provider]  Magnesium 250 MG TABS Take 250 mg by mouth daily.   Yes [provider]  Magnesium Citrate 100 MG TABS Take 100 mg by mouth daily.   Yes [provider]  metFORMIN (GLUCOPHAGE-XR) 500 MG 24 hr tablet Take 1,000 mg by mouth 2 (two) times daily. 02/25/21  Yes [provider]   metoprolol tartrate (LOPRESSOR) 50 MG tablet TAKE 1 TABLET(50 MG) BY MOUTH TWICE DAILY Patient taking differently: Take 50 mg by mouth 2 (two) times daily. 04/28/21  Yes Lennette Bihari, MD  mirabegron ER (MYRBETRIQ) 25 MG TB24 tablet TAKE 1 TABLET(25 MG) BY MOUTH DAILY Patient taking differently: Take 25 mg by mouth daily. TAKE 1 TABLET(25 MG) BY MOUTH DAILY 10/15/20  Yes McGowan, Carollee Herter A, PA-C  Multiple Vitamins-Minerals (ABC PLUS SENIOR PO) Take 1 tablet by mouth daily.   Yes [provider]  nitroGLYCERIN (NITROSTAT) 0.4 MG SL tablet Place 1 tablet (0.4 mg total) under the tongue every 5 (five) minutes x 3 doses as needed for chest pain. 10/11/19  Yes Lennette Bihari, MD  Omega-3 Fatty Acids (FISH OIL OMEGA-3 PO) Take 2 capsules by mouth 2 (two) times daily.    Yes [provider]  pantoprazole (PROTONIX) 40 MG  tablet Take 40 mg by mouth daily.   Yes [provider]  Potassium 99 MG TABS Take 99 mg by mouth daily.   Yes [provider]  pyridOXINE (VITAMIN B-6) 50 MG tablet Take 50 mg by mouth daily.   Yes [provider]  ranolazine (RANEXA) 500 MG 12 hr tablet Take 500 mg by mouth 2 (two) times daily.   Yes [provider]  tamsulosin (FLOMAX) 0.4 MG CAPS capsule TAKE 1 CAPSULE(0.4 MG) BY MOUTH DAILY Patient taking differently: Take 0.4 mg by mouth daily. 07/10/21  Yes McGowan, Carollee HerterShannon A, PA-C  vitamin C (ASCORBIC ACID) 500 MG tablet Take 500 mg by mouth daily.   Yes [provider]  vitamin E 400 UNIT capsule Take 400 Units by mouth daily.   Yes [provider]  AMBULATORY NON FORMULARY MEDICATION Trimix (30/2/20)-(Pap/Phent/PGE)  Dose  Inject 0.7 mcg per injection  Vial 1ml  Qty #3 Refills 0  [facility] [phone] Fax [phone] Patient not taking: No sig reported 03/19/21   Michiel CowboyMcGowan, Shannon A, PA-C  ranolazine (RANEXA) 1000 MG SR tablet TAKE 1 TABLET(1000 MG) BY MOUTH TWICE DAILY Patient not  taking: Reported on 07/14/2021 03/05/21   Lennette BihariKelly, Thomas A, MD    Inpatient Medications: Scheduled Meds:  atorvastatin  40 mg Oral q1800   Chlorhexidine Gluconate Cloth  6 each Topical Daily   glimepiride  2 mg Oral Q breakfast   heparin  5,000 Units Subcutaneous Q8H   insulin aspart  0-15 Units Subcutaneous TID WC   isosorbide mononitrate  90 mg Oral Daily   mouth rinse  15 mL Mouth Rinse BID   pantoprazole  40 mg Oral BID   ranolazine  1,000 mg Oral BID   sodium chloride flush  3 mL Intravenous Q12H   tamsulosin  0.4 mg Oral QPC breakfast   Continuous Infusions:  sodium chloride 75 mL/hr at 07/15/21 0700   sodium chloride 10 mL/hr at 07/15/21 0700   PRN Meds: sodium chloride, acetaminophen, atropine, ondansetron (ZOFRAN) IV, sodium chloride flush  Allergies:   No Known Allergies  Social History:   Social History   Socioeconomic History   Marital status: Married    Spouse name: Not on file   Number of children: Not on file   Years of education: Not on file   Highest education level: Not on file  Occupational History   Not on file  Tobacco Use   Smoking status: Never   Smokeless tobacco: Never  Substance and Sexual Activity   Alcohol use: No   Drug use: No   Sexual activity: Not on file  Other Topics Concern   Not on file  Social History Narrative   Not on file   Social Determinants of Health   Financial Resource Strain: Not on file  Food Insecurity: Not on file  Transportation Needs: Not on file  Physical Activity: Not on file  Stress: Not on file  Social Connections: Not on file  Intimate Partner Violence: Not on file    Family History:   Family History  Problem Relation Age of Onset   Heart disease Father    Heart attack Father      ROS:  Please see the history of present illness.  All other ROS reviewed and negative.     Physical Exam/Data:   Vitals:   07/15/21 0500 07/15/21 0600 07/15/21 0700 07/15/21 0737  BP: 134/77 128/73 (!) 148/81    Pulse:      Resp:  18 (!) 23 17   Temp:    98.3 F (36.8 C)  TempSrc:    Oral  SpO2: 98% 99% 96%   Weight:      Height:        Intake/Output Summary (Last 24 hours) at 07/15/2021 0815 Last data filed at 07/15/2021 0700 Gross per 24 hour  Intake 1854.41 ml  Output 5300 ml  Net -3445.59 ml   Last 3 Weights 07/14/2021 03/29/2021 03/06/2021  Weight (lbs) 210 lb 8.6 oz 203 lb 201 lb  Weight (kg) 95.5 kg 92.08 kg 91.173 kg     Body mass index is 28.55 kg/m.  General:  Well nourished, well developed, in no acute distress HEENT: normal Lymph: no adenopathy Neck: no JVD Endocrine:  No thryomegaly Vascular: No carotid bruits; FA pulses 2+ bilaterally without bruits  Cardiac:  RRR; no murmurs, gallops or rubs Lungs:  slightly diminished at the bases, no wheezing, rhonchi or rales  Abd: soft, nontender  Ext: no edema Musculoskeletal:  No deformities Skin: warm and dry  Neuro: no focal abnormalities noted Psych:  Normal affect   EKG:  The EKG was personally reviewed and demonstrates:   3:1 AVBlock, RBBB, LPFB  OLD 06/06/21 SR 71bpm, RBBB 03/29/21: SR 68bpm, RBBB, 1st degree AVblock, 9/23/211: SR  78, RBBB   Telemetry:  Telemetry was personally reviewed and demonstrates:   SR 80's, intermittent V pacing overnight, though not predominantly (at 80)  Relevant CV Studies: 07/14/21: TTE IMPRESSIONS   1. AV dissociation precludes detailed analysis of diastolic function, but  annular tissue Doppler diastolic veloicites are normal for age. Left  ventricular ejection fraction, by estimation, is 60 to 65%. The left  ventricle has normal function. The left  ventricle has no regional wall motion abnormalities. Left ventricular  diastolic function could not be evaluated.   2. Right ventricular systolic function is normal. The right ventricular  size is normal.   3. Left atrial size was mildly dilated.   4. The mitral valve is normal in structure. No evidence of mitral valve   regurgitation. No evidence of mitral stenosis.   5. The aortic valve is normal in structure. Aortic valve regurgitation is  not visualized. No aortic stenosis is present.   6. The inferior vena cava is normal in size with greater than 50%  respiratory variability, suggesting right atrial pressure of 3 mmHg. Laboratory Data:  High Sensitivity Troponin:   Recent Labs  Lab 07/14/21 1108 07/14/21 1159 07/14/21 1418  TROPONINIHS 3 3 118*     Chemistry Recent Labs  Lab 07/14/21 1108 07/14/21 1122 07/14/21 1159 07/15/21 0157  NA 136 140 137 140  K 4.9 4.9 5.5* 3.5  CL 108  --  110 109  CO2 21*  --  22 22  GLUCOSE 224*  --  215* 110*  BUN 24*  --  23 16  CREATININE 1.43*  --  1.49* 1.06  CALCIUM 8.0*  --  7.6* 8.5*  GFRNONAA 54*  --  51* >60  ANIONGAP 7  --  5 9    Recent Labs  Lab 07/14/21 1108 07/14/21 1159  PROT 5.3* 5.2*  ALBUMIN 3.1* 3.0*  AST 21 18  ALT 21 22  ALKPHOS 31* 29*  BILITOT 0.9 1.1   Hematology Recent Labs  Lab 07/14/21 1108 07/14/21 1122 07/14/21 1159 07/15/21 0157  WBC 10.5  --  13.4* 10.2  RBC 3.74*  --  3.57* 4.23  HGB 12.2* 11.2* 11.4* 13.5  HCT 35.2*  33.0* 33.5* 38.8*  MCV 94.1  --  93.8 91.7  MCH 32.6  --  31.9 31.9  MCHC 34.7  --  34.0 34.8  RDW 12.2  --  12.1 12.2  PLT 174  --  168 168   BNPNo results for input(s): BNP, PROBNP in the last 168 hours.  DDimer No results for input(s): DDIMER in the last 168 hours.   Radiology/Studies:   DG Chest Port 1 View Result Date: 07/15/2021 CLINICAL DATA:  Sore chest.  Prior temporary pacer. EXAM: PORTABLE CHEST 1 VIEW COMPARISON:  07/14/2021. FINDINGS: Cardiomegaly with mild pulmonary venous congestion. Low lung volumes. Interim clearing of bilateral pulmonary infiltrates/edema. No focal infiltrate. No pleural effusion or pneumothorax. Degenerative change thoracic spine. IMPRESSION: 1.  Cardiomegaly with mild pulmonary venous congestion. 2.  Interim clearing of bilateral pulmonary  infiltrates/edema. Electronically Signed   By: Maisie Fus  Register   On: 07/15/2021 07:32   DG Chest Port 1 View Result Date: 07/14/2021 CLINICAL DATA:  Chest pain, bradycardia and hypotension this morning. EXAM: PORTABLE CHEST 1 VIEW COMPARISON:  PA and lateral chest 08/04/2017. FINDINGS: Small patchy focus of airspace opacity is seen in the right lung base. Lungs otherwise clear. Heart size normal. Aortic atherosclerosis. Defibrillator pad is in place. No acute or focal bony abnormality. IMPRESSION: Small focus of right basilar airspace opacity is likely due to atelectasis. Aortic Atherosclerosis (ICD10-I70.0). Electronically Signed   By: Drusilla Kanner M.D.   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 Royann Shivers no suspicion of active ischemic issues Echo with preserved LVEF and no WMA   Given marked improvement so far of his conduction I think is reasonable to consider holding off on pacing with a potential reversible cause with BB on board I have discussed this with the patient and his wife at bedside He was taking 50mg   lopressor BID, and took it about 06:30 yesterday AM  I have given his clear liquid breakfast, and discuss further with Dr. who will see him later today     Risk Assessment/Risk Scores:    For questions or updates, please contact [facility] Please consult [email or web address] for contact info under    Signed, Johney Frame, PA-C  07/15/2021 8:15 AM   I have seen, examined the patient, and reviewed the above assessment and plan.   Changes to above are made where necessary.  On exam, RRR.  His AV conduction has improved off metoprolol.  Given that he has no angina and preserved EF, I think that it is reasonable to stop metoprolol and observe clinically. We will keep temp wire in until tomorrow AM.  If no further AV block perhaps we can avoid pacing.  If he has any further AV block then we will plan to proceed with PPM at that time.  Co Sign: 07/17/2021, MD 07/15/2021 10:45 AM

## 2021-07-16 DIAGNOSIS — I442 Atrioventricular block, complete: Secondary | ICD-10-CM | POA: Diagnosis not present

## 2021-07-16 LAB — CBC
HCT: 33.8 % — ABNORMAL LOW (ref 39.0–52.0)
Hemoglobin: 11.8 g/dL — ABNORMAL LOW (ref 13.0–17.0)
MCH: 32.2 pg (ref 26.0–34.0)
MCHC: 34.9 g/dL (ref 30.0–36.0)
MCV: 92.1 fL (ref 80.0–100.0)
Platelets: 142 10*3/uL — ABNORMAL LOW (ref 150–400)
RBC: 3.67 MIL/uL — ABNORMAL LOW (ref 4.22–5.81)
RDW: 12.1 % (ref 11.5–15.5)
WBC: 7 10*3/uL (ref 4.0–10.5)
nRBC: 0 % (ref 0.0–0.2)

## 2021-07-16 LAB — BASIC METABOLIC PANEL
Anion gap: 5 (ref 5–15)
BUN: 14 mg/dL (ref 8–23)
CO2: 24 mmol/L (ref 22–32)
Calcium: 8.1 mg/dL — ABNORMAL LOW (ref 8.9–10.3)
Chloride: 110 mmol/L (ref 98–111)
Creatinine, Ser: 1.08 mg/dL (ref 0.61–1.24)
GFR, Estimated: >60 mL/min (ref >60)
Glucose, Bld: 112 mg/dL — ABNORMAL HIGH (ref 70–99)
Potassium: 3.3 mmol/L — ABNORMAL LOW (ref 3.5–5.1)
Sodium: 139 mmol/L (ref 135–145)

## 2021-07-16 LAB — GLUCOSE, CAPILLARY
Glucose-Capillary: 105 mg/dL — ABNORMAL HIGH (ref 70–99)
Glucose-Capillary: 218 mg/dL — ABNORMAL HIGH (ref 70–99)

## 2021-07-16 MED ORDER — POTASSIUM CHLORIDE 10 MEQ/100ML IV SOLN
10.0000 meq | INTRAVENOUS | Status: AC
  Administered 2021-07-16 (×2): 10 meq via INTRAVENOUS
  Filled 2021-07-16 (×2): qty 100

## 2021-07-16 MED ORDER — ATROPINE SULFATE 1 MG/10ML IJ SOSY
PREFILLED_SYRINGE | INTRAMUSCULAR | Status: AC
  Filled 2021-07-16: qty 10

## 2021-07-16 MED ORDER — POTASSIUM CHLORIDE CRYS ER 10 MEQ PO TBCR
40.0000 meq | EXTENDED_RELEASE_TABLET | Freq: Every day | ORAL | Status: DC
  Administered 2021-07-16: 40 meq via ORAL
  Filled 2021-07-16: qty 4

## 2021-07-16 NOTE — Discharge Summary (Signed)
Discharge Summary    Patient ID: Patrick [name] MRN: [id number]; DOB: 10/22/1956  Admit date: 07/14/2021 Discharge date: 07/16/2021  PCP:  Patrick Regulus, MD   [facility] Providers Cardiologist:  Patrick Guadalajara, MD        Discharge Diagnoses    Principal Problem:   Heart block AV complete [facility]) Active Problems:   Type 2 diabetes mellitus with complication, without long-term current use of insulin (HCC)   Hyperlipidemia LDL goal <70   CAD in native artery   Essential hypertension   Hyperlipidemia associated with type 2 diabetes mellitus (HCC)   Hypertensive kidney disease with CKD stage III (HCC)   Complete heart block (HCC)    Diagnostic Studies/Procedures    Echo 07/14/21: 1. AV dissociation precludes detailed analysis of diastolic function, but  annular tissue Doppler diastolic veloicites are normal for age. Left  ventricular ejection fraction, by estimation, is 60 to 65%. The left  ventricle has normal function. The left  ventricle has no regional wall motion abnormalities. Left ventricular  diastolic function could not be evaluated.   2. Right ventricular systolic function is normal. The right ventricular  size is normal.   3. Left atrial size was mildly dilated.   4. The mitral valve is normal in structure. No evidence of mitral valve  regurgitation. No evidence of mitral stenosis.   5. The aortic valve is normal in structure. Aortic valve regurgitation is  not visualized. No aortic stenosis is present.   6. The inferior vena cava is normal in size with greater than 50%  respiratory variability, suggesting right atrial pressure of 3 mmHg.  _____________  Temporary pacemaker 07/14/21:  Successful placement of Temporary Transvenous Pacemaker via 6 French RV access -> rate set at 80 bpm, 5 mA (Threshold: 0.54mA; inserted to 75 cm at hub)  _____________  Heart cath 03/21/18: Previously placed Prox LAD to Mid LAD stent (unknown type) is widely patent. The  left ventricular systolic function is normal. LV end diastolic pressure is normal. The left ventricular ejection fraction is 50-55% by visual estimate. There is no mitral valve regurgitation.   Preserved global LV contractility with a small region of very mild residual mid anterolateral hypocontractility.  Ejection fraction 50-55%.   No significant coronary obstructive disease with evidence for widely patent mid LAD stent after the takeoff of a first diagonal vessel, normal left circumflex and normal dominant RCA.   RECOMMENDATION: Medical therapy.  Continue DAPT in this patient status post anterior ST segment MI August 2018.  Optimize blood pressure.  Continue high potency statin therapy.   Diagnostic Dominance: Right      History of Present Illness     Patrick [name] is a 65 y.o. male with a history of CAD who presented with syncope and CHB. He has had repeated episodes of near-syncope/syncope over the last 3-4 months and presented with CHB requiring requiring TPW.   Patrick [name] was in his usual state of health when he presented to work today.  The onset of loss of consciousness and was found to be bradycardic and hypotensive.  ECG revealed complete heart block.  Previous ECG showed evidence of conduction system disease (RBBB dating back at least to 2018, long PR interval).   According to his wife, Patrick [name], this is Patrick [name]'s fourth episode of near syncope or syncope this year.  Previous events have been associated with falls and injury, thankfully limited to bruising, which is seen on his left elbow, right inner thigh, left ankle.  Each time he had a relatively quick recovery without any intervention.  None of the episodes of syncope or near syncope were associated with angina or dyspnea or subsequent postictal state or focal neurological deficits.  He had an ED visit in April.  At that time ECG shows sinus rhythm, first-degree AV block and right bundle branch block.  CT of the head and MRA were  both unremarkable   Most recently he was seen by his primary care provider on 06/11/2021 for this complaints and a 48-hour Holter monitor was ordered.  As far as I can tell this showed normal findings.  I cannot review the tracings or even see the reported results but there is a note titled "Holter monitor results" in CareEverywhere: "Notified patient per Dr. Dareen [name], holter normal and recommends keeping stress echo appointment. Patient verbalized understanding."   Currently he is receiving transcutaneous pacing and has received sedatives.  He is groggy, but easily arousable and responds appropriately to questions.  Extremities are warm.  He is emergently going to the Cath Lab for temporary transvenous pacemaker.   He underwent angioplasty/stent to the LAD in August 2018 (resolute Onyx 3.0x38 drug-eluting stent) in the setting of acute anterior wall STEMI.  Note that he had transient complete heart block during that procedure which responded to IV atropine.  Most recent cardiac catheterization performed in April 2019 showed a patent stent in the proximal to mid LAD and normal left ventricular systolic function with a very small area of mid anterolateral hypocontractility, EF 50-55%.  No lesions were seen in the dominant right coronary artery.  A follow-up nuclear perfusion study in October 2020 a low restudy with a suggestion of mild mid-basal inferior wall ischemia, EF 59%.   He has well-controlled diabetes mellitus with a hemoglobin A1c of 6.1% in April 2022. His most recent LDL cholesterol was 53 on statin therapy.  He has normal renal function.  Hospital Course     Consultants: EP  CHB Symptomatic bradycardia Near syncope RBBB Pt has a history of RBBB and transient CHB at the time of his STEMI 2018. On presentation, telemetry revealed complete heart block. He was sedated and an emergent temp wire was placed for pacing. BB was held and temp wire remained in place. Conduction recovered and he was  not requiring further pacing. EP opted to defer PPM implant. Temp wire was removed. He ambulated without issues.   Per personal communication with Dr. Elberta Fortis: near-syncope/syncope was a reversible cause and he is cleared to drive.    CAD Hypertension Hyperlipidemia with LDL goal < 70 Hx of anterior STEMI treated with emergent LAD stenting in 2018. Nuclear stress test in 2020 was low risk. Maintained on ASA and 60 mg brilinta BID, imdur, ranexa, amlodipine, and losartan. Lopressor 50 mg BID was discontinued as above. Will need to follow his BP and anginal symptoms closely.    DM Resume home medications.    Did the patient have an acute coronary syndrome (MI, NSTEMI, STEMI, etc) this admission?:  No                               Did the patient have a percutaneous coronary intervention (stent / angioplasty)?:  No.       _____________  Discharge Vitals Blood pressure 131/81, pulse 83, temperature 98.7 F (37.1 C), temperature source Oral, resp. rate 20, height 6' (1.829 m), weight 95.5 kg, SpO2 96 %.  Filed Weights  07/14/21 2107  Weight: 95.5 kg    Labs & Radiologic Studies    CBC Recent Labs    07/14/21 1108 07/14/21 1122 07/15/21 0157 07/16/21 0157  WBC 10.5   < > 10.2 7.0  NEUTROABS 8.0*  --   --   --   HGB 12.2*   < > 13.5 11.8*  HCT 35.2*   < > 38.8* 33.8*  MCV 94.1   < > 91.7 92.1  PLT 174   < > 168 142*   < > = values in this interval not displayed.   Basic Metabolic Panel Recent Labs    16/09/9606/26/22 0157 07/16/21 0157  NA 140 139  K 3.5 3.3*  CL 109 110  CO2 22 24  GLUCOSE 110* 112*  BUN 16 14  CREATININE 1.06 1.08  CALCIUM 8.5* 8.1*   Liver Function Tests Recent Labs    07/14/21 1108 07/14/21 1159  AST 21 18  ALT 21 22  ALKPHOS 31* 29*  BILITOT 0.9 1.1  PROT 5.3* 5.2*  ALBUMIN 3.1* 3.0*   No results for input(s): LIPASE, AMYLASE in the last 72 hours. High Sensitivity Troponin:   Recent Labs  Lab 07/14/21 1108 07/14/21 1159  07/14/21 1418  TROPONINIHS 3 3 118*    BNP Invalid input(s): POCBNP D-Dimer No results for input(s): DDIMER in the last 72 hours. Hemoglobin A1C Recent Labs    07/14/21 1159  HGBA1C 5.9*   Fasting Lipid Panel Recent Labs    07/14/21 1159  CHOL 79  HDL 27*  LDLCALC 34  TRIG 89  CHOLHDL 2.9   Thyroid Function Tests No results for input(s): TSH, T4TOTAL, T3FREE, THYROIDAB in the last 72 hours.  Invalid input(s): FREET3 _____________  CARDIAC CATHETERIZATION  Result Date: 07/14/2021 Successful placement of Temporary Transvenous Pacemaker via 6 French RV access -> rate set at 80 bpm, 5 mA (Threshold: 0.666mA; inserted to 75 cm at hub)   DG Chest Port 1 View  Result Date: 07/15/2021 CLINICAL DATA:  Sore chest.  Prior temporary pacer. EXAM: PORTABLE CHEST 1 VIEW COMPARISON:  07/14/2021. FINDINGS: Cardiomegaly with mild pulmonary venous congestion. Low lung volumes. Interim clearing of bilateral pulmonary infiltrates/edema. No focal infiltrate. No pleural effusion or pneumothorax. Degenerative change thoracic spine. IMPRESSION: 1.  Cardiomegaly with mild pulmonary venous congestion. 2.  Interim clearing of bilateral pulmonary infiltrates/edema. Electronically Signed   By: Maisie Fushomas  Register   On: 07/15/2021 07:32   DG Chest Port 1 View  Result Date: 07/14/2021 CLINICAL DATA:  Chest pain, bradycardia and hypotension this morning. EXAM: PORTABLE CHEST 1 VIEW COMPARISON:  PA and lateral chest 08/04/2017. FINDINGS: Small patchy focus of airspace opacity is seen in the right lung base. Lungs otherwise clear. Heart size normal. Aortic atherosclerosis. Defibrillator pad is in place. No acute or focal bony abnormality. IMPRESSION: Small focus of right basilar airspace opacity is likely due to atelectasis. Aortic Atherosclerosis (ICD10-I70.0). Electronically Signed   By: Drusilla Kannerhomas  Dalessio M.D.   On: 07/14/2021 13:46   ECHOCARDIOGRAM COMPLETE  Result Date: 07/14/2021    ECHOCARDIOGRAM REPORT    Patient Name:   Patrick [name] Date of Exam: 07/14/2021 Medical Rec #:  [id number]    Height:       72.0 in Accession #:    [id number]   Weight:       203.0 lb Date of Birth:  01/08/1956    BSA:          2.144 m Patient Age:    [age] years  BP:           136/78 mmHg Patient Gender: M            HR:           80 bpm. Exam Location:  Inpatient Procedure: 2D Echo, Color Doppler and Cardiac Doppler STAT ECHO Indications:    I44.2 Complete heart block  History:        Patient has no prior history of Echocardiogram examinations.                 CAD; Risk Factors:Hypertension, Diabetes and Dyslipidemia.  Sonographer:    Irving Burton Senior RDCS Referring Phys: [id number] Jaree Dwight NICOLE Shann Lewellyn IMPRESSIONS  1. AV dissociation precludes detailed analysis of diastolic function, but annular tissue Doppler diastolic veloicites are normal for age. Left ventricular ejection fraction, by estimation, is 60 to 65%. The left ventricle has normal function. The left ventricle has no regional wall motion abnormalities. Left ventricular diastolic function could not be evaluated.  2. Right ventricular systolic function is normal. The right ventricular size is normal.  3. Left atrial size was mildly dilated.  4. The mitral valve is normal in structure. No evidence of mitral valve regurgitation. No evidence of mitral stenosis.  5. The aortic valve is normal in structure. Aortic valve regurgitation is not visualized. No aortic stenosis is present.  6. The inferior vena cava is normal in size with greater than 50% respiratory variability, suggesting right atrial pressure of 3 mmHg. FINDINGS  Left Ventricle: AV dissociation precludes detailed analysis of diastolic function, but annular tissue Doppler diastolic veloicites are normal for age. Left ventricular ejection fraction, by estimation, is 60 to 65%. The left ventricle has normal function. The left ventricle has no regional wall motion abnormalities. The left ventricular internal cavity size was normal in  size. There is no left ventricular hypertrophy. Abnormal (paradoxical) septal motion, consistent with RV pacemaker. Left ventricular diastolic function could not be evaluated due to nondiagnostic images. Left ventricular diastolic function could not be evaluated. Right Ventricle: The right ventricular size is normal. No increase in right ventricular wall thickness. Right ventricular systolic function is normal. Left Atrium: Left atrial size was mildly dilated. Right Atrium: Right atrial size was normal in size. Pericardium: There is no evidence of pericardial effusion. Mitral Valve: The mitral valve is normal in structure. No evidence of mitral valve regurgitation. No evidence of mitral valve stenosis. Tricuspid Valve: The tricuspid valve is normal in structure. Tricuspid valve regurgitation is not demonstrated. No evidence of tricuspid stenosis. Aortic Valve: The aortic valve is normal in structure. Aortic valve regurgitation is not visualized. No aortic stenosis is present. Pulmonic Valve: The pulmonic valve was normal in structure. Pulmonic valve regurgitation is not visualized. No evidence of pulmonic stenosis. Aorta: The aortic root is normal in size and structure. Venous: The inferior vena cava is normal in size with greater than 50% respiratory variability, suggesting right atrial pressure of 3 mmHg. IAS/Shunts: No atrial level shunt detected by color flow Doppler. Additional Comments: A device lead is visualized in the right ventricle.  LEFT VENTRICLE PLAX 2D LVIDd:         4.90 cm  Diastology LVIDs:         3.10 cm  LV e' medial:    5.87 cm/s LV PW:         0.90 cm  LV E/e' medial:  15.5 LV IVS:        0.90 cm  LV e' lateral:   11.10  cm/s LVOT diam:     2.10 cm  LV E/e' lateral: 8.2 LV SV:         63 LV SV Index:   29 LVOT Area:     3.46 cm  RIGHT VENTRICLE RV S prime:     14.10 cm/s TAPSE (M-mode): 1.8 cm LEFT ATRIUM             Index       RIGHT ATRIUM           Index LA diam:        3.50 cm 1.63 cm/m   RA Area:     11.90 cm LA Vol (A2C):   71.1 ml 33.16 ml/m RA Volume:   25.00 ml  11.66 ml/m LA Vol (A4C):   59.4 ml 27.70 ml/m LA Biplane Vol: 64.9 ml 30.27 ml/m  AORTIC VALVE LVOT Vmax:   93.30 cm/s LVOT Vmean:  73.200 cm/s LVOT VTI:    0.181 m  AORTA Ao Root diam: 3.30 cm Ao Asc diam:  3.20 cm MITRAL VALVE MV Area (PHT): 3.31 cm    SHUNTS MV Decel Time: 229 msec    Systemic VTI:  0.18 m MV E velocity: 90.80 cm/s  Systemic Diam: 2.10 cm MV A velocity: 56.10 cm/s MV E/A ratio:  1.62 Mihai Croitoru MD Electronically signed by Thurmon Fair MD Signature Date/Time: 07/14/2021/4:04:56 PM    Final    Disposition   Pt is being discharged home today in good condition.  Follow-up Plans & Appointments     Follow-up Information     Lennette Bihari, MD Follow up on 07/25/2021.   Specialty: Cardiology Why: 3:00 pm hospital follow up Contact information: [address] [place] [address] [phone]                  Discharge Medications   Allergies as of 07/16/2021   No Known Allergies      Medication List     STOP taking these medications    AMBULATORY NON FORMULARY MEDICATION   Magnesium Citrate 100 MG Tabs   metoprolol tartrate 50 MG tablet Commonly known as: LOPRESSOR       TAKE these medications    ABC PLUS SENIOR PO Take 1 tablet by mouth daily.   amLODipine 10 MG tablet Commonly known as: NORVASC TAKE 1 TABLET(10 MG) BY MOUTH DAILY What changed: See the new instructions.   aspirin 81 MG EC tablet Commonly known as: Aspirin Low Dose Take 1 tablet (81 mg total) by mouth daily.   atorvastatin 40 MG tablet Commonly known as: LIPITOR TAKE 1 TABLET(40 MG) BY MOUTH EVERY EVENING What changed: See the new instructions.   Brilinta 60 MG Tabs tablet Generic drug: ticagrelor TAKE 1 TABLET BY MOUTH TWICE DAILY What changed: how much to take   Coenzyme Q10 200 MG capsule Take 200 mg by mouth daily.   FISH OIL OMEGA-3 PO Take 2 capsules  by mouth 2 (two) times daily.   glimepiride 2 MG tablet Commonly known as: AMARYL Take 2 mg by mouth daily with breakfast.   GLUCOSAMINE 1500 COMPLEX PO Take 1,500 mg by mouth daily.   isosorbide mononitrate 60 MG 24 hr tablet Commonly known as: IMDUR TAKE 1 AND 1/2 TABLETS(90 MG) BY MOUTH DAILY What changed: See the new instructions.   losartan 100 MG tablet Commonly known as: COZAAR TAKE 1 TABLET(100 MG) BY MOUTH DAILY What changed: See the new instructions.   Magnesium 250 MG Tabs  Take 250 mg by mouth daily. What changed: Another medication with the same name was removed. Continue taking this medication, and follow the directions you see here.   metFORMIN 500 MG 24 hr tablet Commonly known as: GLUCOPHAGE-XR Take 1,000 mg by mouth 2 (two) times daily.   mirabegron ER 25 MG Tb24 tablet Commonly known as: Myrbetriq TAKE 1 TABLET(25 MG) BY MOUTH DAILY What changed:  how much to take how to take this when to take this   nitroGLYCERIN 0.4 MG SL tablet Commonly known as: NITROSTAT Place 1 tablet (0.4 mg total) under the tongue every 5 (five) minutes x 3 doses as needed for chest pain.   pantoprazole 40 MG tablet Commonly known as: PROTONIX Take 40 mg by mouth daily.   Potassium 99 MG Tabs Take 99 mg by mouth daily.   pyridOXINE 50 MG tablet Commonly known as: VITAMIN B-6 Take 50 mg by mouth daily.   ranolazine 1000 MG SR tablet Commonly known as: RANEXA TAKE 1 TABLET(1000 MG) BY MOUTH TWICE DAILY What changed: Another medication with the same name was removed. Continue taking this medication, and follow the directions you see here.   tamsulosin 0.4 MG Caps capsule Commonly known as: FLOMAX TAKE 1 CAPSULE(0.4 MG) BY MOUTH DAILY What changed: See the new instructions.   vitamin C 500 MG tablet Commonly known as: ASCORBIC ACID Take 500 mg by mouth daily.   vitamin E 180 MG (400 UNITS) capsule Take 400 Units by mouth daily.           Outstanding  Labs/Studies   Follow pressure  Duration of Discharge Encounter   Greater than 30 minutes including physician time.  Signed, Roe Rutherford Silver Parkey, PA 07/16/2021, 1:33 PM

## 2021-07-16 NOTE — Progress Notes (Addendum)
Progress Note  Patient Name: Patrick [name] L Mckesson Date of Encounter: 07/16/2021  [facility] Cardiologist: Nicki Guadalajarahomas Kelly, MD   Subjective   No further AV block. TPW removed.  Inpatient Medications    Scheduled Meds:  atorvastatin  40 mg Oral q1800   Chlorhexidine Gluconate Cloth  6 each Topical Daily   glimepiride  2 mg Oral Q breakfast   heparin  5,000 Units Subcutaneous Q8H   insulin aspart  0-15 Units Subcutaneous TID WC   isosorbide mononitrate  90 mg Oral Daily   mouth rinse  15 mL Mouth Rinse BID   pantoprazole  40 mg Oral BID   potassium chloride  40 mEq Oral Daily   ranolazine  1,000 mg Oral BID   sodium chloride flush  3 mL Intravenous Q12H   tamsulosin  0.4 mg Oral QHS   Continuous Infusions:  sodium chloride 75 mL/hr at 07/16/21 0900   sodium chloride Stopped (07/16/21 0859)   PRN Meds: sodium chloride, acetaminophen, atropine, ondansetron (ZOFRAN) IV, sodium chloride flush   Vital Signs    Vitals:   07/16/21 0600 07/16/21 0700 07/16/21 0752 07/16/21 0800  BP: (!) 118/56 140/79  (!) 149/81  Pulse:      Resp: 14 14  17   Temp:   99 F (37.2 C)   TempSrc:   Oral   SpO2: 94% 97%  96%  Weight:      Height:        Intake/Output Summary (Last 24 hours) at 07/16/2021 0958 Last data filed at 07/16/2021 0900 Gross per 24 hour  Intake 2216.97 ml  Output 2980 ml  Net -763.03 ml   Last 3 Weights 07/14/2021 03/29/2021 03/06/2021  Weight (lbs) 210 lb 8.6 oz 203 lb 201 lb  Weight (kg) 95.5 kg 92.08 kg 91.173 kg      Telemetry    NSR - Personally Reviewed  ECG    No new tracing - Personally Reviewed  Physical Exam  Appears well. No hematoma R groin GEN: No acute distress.   Neck: No JVD Cardiac: RRR, no murmurs, rubs, or gallops.  Respiratory: Clear to auscultation bilaterally. GI: Soft, nontender, non-distended  MS: No edema; No deformity. Neuro:  Nonfocal  Psych: Normal affect   Labs    High Sensitivity Troponin:   Recent Labs  Lab 07/14/21 1108  07/14/21 1159 07/14/21 1418  TROPONINIHS 3 3 118*      Chemistry Recent Labs  Lab 07/14/21 1108 07/14/21 1122 07/14/21 1159 07/15/21 0157 07/16/21 0157  NA 136   < > 137 140 139  K 4.9   < > 5.5* 3.5 3.3*  CL 108  --  110 109 110  CO2 21*  --  22 22 24   GLUCOSE 224*  --  215* 110* 112*  BUN 24*  --  23 16 14   CREATININE 1.43*  --  1.49* 1.06 1.08  CALCIUM 8.0*  --  7.6* 8.5* 8.1*  PROT 5.3*  --  5.2*  --   --   ALBUMIN 3.1*  --  3.0*  --   --   AST 21  --  18  --   --   ALT 21  --  22  --   --   ALKPHOS 31*  --  29*  --   --   BILITOT 0.9  --  1.1  --   --   GFRNONAA 54*  --  51* >60 >60  ANIONGAP 7  --  5 9 5    < > =  values in this interval not displayed.     Hematology Recent Labs  Lab 07/14/21 1159 07/15/21 0157 07/16/21 0157  WBC 13.4* 10.2 7.0  RBC 3.57* 4.23 3.67*  HGB 11.4* 13.5 11.8*  HCT 33.5* 38.8* 33.8*  MCV 93.8 91.7 92.1  MCH 31.9 31.9 32.2  MCHC 34.0 34.8 34.9  RDW 12.1 12.2 12.1  PLT 168 168 142*    BNPNo results for input(s): BNP, PROBNP in the last 168 hours.   DDimer No results for input(s): DDIMER in the last 168 hours.   Radiology    CARDIAC CATHETERIZATION  Result Date: 07/14/2021 Successful placement of Temporary Transvenous Pacemaker via 6 French RV access -> rate set at 80 bpm, 5 mA (Threshold: 0.80mA; inserted to 75 cm at hub)   DG Chest Port 1 View  Result Date: 07/15/2021 CLINICAL DATA:  Sore chest.  Prior temporary pacer. EXAM: PORTABLE CHEST 1 VIEW COMPARISON:  07/14/2021. FINDINGS: Cardiomegaly with mild pulmonary venous congestion. Low lung volumes. Interim clearing of bilateral pulmonary infiltrates/edema. No focal infiltrate. No pleural effusion or pneumothorax. Degenerative change thoracic spine. IMPRESSION: 1.  Cardiomegaly with mild pulmonary venous congestion. 2.  Interim clearing of bilateral pulmonary infiltrates/edema. Electronically Signed   By: Maisie Fus  Register   On: 07/15/2021 07:32   DG Chest Port 1  View  Result Date: 07/14/2021 CLINICAL DATA:  Chest pain, bradycardia and hypotension this morning. EXAM: PORTABLE CHEST 1 VIEW COMPARISON:  PA and lateral chest 08/04/2017. FINDINGS: Small patchy focus of airspace opacity is seen in the right lung base. Lungs otherwise clear. Heart size normal. Aortic atherosclerosis. Defibrillator pad is in place. No acute or focal bony abnormality. IMPRESSION: Small focus of right basilar airspace opacity is likely due to atelectasis. Aortic Atherosclerosis (ICD10-I70.0). Electronically Signed   By: Drusilla Kanner M.D.   On: 07/14/2021 13:46   ECHOCARDIOGRAM COMPLETE  Result Date: 07/14/2021    ECHOCARDIOGRAM REPORT   Patient Name:   Patrick [name] Date of Exam: 07/14/2021 Medical Rec #:  [id number]    Height:       72.0 in Accession #:    [id number]   Weight:       203.0 lb Date of Birth:  Feb 25, 1956    BSA:          2.144 m Patient Age:    65 years     BP:           136/78 mmHg Patient Gender: M            HR:           80 bpm. Exam Location:  Inpatient Procedure: 2D Echo, Color Doppler and Cardiac Doppler STAT ECHO Indications:    I44.2 Complete heart block  History:        Patient has no prior history of Echocardiogram examinations.                 CAD; Risk Factors:Hypertension, Diabetes and Dyslipidemia.  Sonographer:    Irving Burton Senior RDCS Referring Phys: [id number] ANGELA NICOLE DUKE IMPRESSIONS  1. AV dissociation precludes detailed analysis of diastolic function, but annular tissue Doppler diastolic veloicites are normal for age. Left ventricular ejection fraction, by estimation, is 60 to 65%. The left ventricle has normal function. The left ventricle has no regional wall motion abnormalities. Left ventricular diastolic function could not be evaluated.  2. Right ventricular systolic function is normal. The right ventricular size is normal.  3. Left atrial size was mildly dilated.  4. The  mitral valve is normal in structure. No evidence of mitral valve regurgitation. No  evidence of mitral stenosis.  5. The aortic valve is normal in structure. Aortic valve regurgitation is not visualized. No aortic stenosis is present.  6. The inferior vena cava is normal in size with greater than 50% respiratory variability, suggesting right atrial pressure of 3 mmHg. FINDINGS  Left Ventricle: AV dissociation precludes detailed analysis of diastolic function, but annular tissue Doppler diastolic veloicites are normal for age. Left ventricular ejection fraction, by estimation, is 60 to 65%. The left ventricle has normal function. The left ventricle has no regional wall motion abnormalities. The left ventricular internal cavity size was normal in size. There is no left ventricular hypertrophy. Abnormal (paradoxical) septal motion, consistent with RV pacemaker. Left ventricular diastolic function could not be evaluated due to nondiagnostic images. Left ventricular diastolic function could not be evaluated. Right Ventricle: The right ventricular size is normal. No increase in right ventricular wall thickness. Right ventricular systolic function is normal. Left Atrium: Left atrial size was mildly dilated. Right Atrium: Right atrial size was normal in size. Pericardium: There is no evidence of pericardial effusion. Mitral Valve: The mitral valve is normal in structure. No evidence of mitral valve regurgitation. No evidence of mitral valve stenosis. Tricuspid Valve: The tricuspid valve is normal in structure. Tricuspid valve regurgitation is not demonstrated. No evidence of tricuspid stenosis. Aortic Valve: The aortic valve is normal in structure. Aortic valve regurgitation is not visualized. No aortic stenosis is present. Pulmonic Valve: The pulmonic valve was normal in structure. Pulmonic valve regurgitation is not visualized. No evidence of pulmonic stenosis. Aorta: The aortic root is normal in size and structure. Venous: The inferior vena cava is normal in size with greater than 50% respiratory  variability, suggesting right atrial pressure of 3 mmHg. IAS/Shunts: No atrial level shunt detected by color flow Doppler. Additional Comments: A device lead is visualized in the right ventricle.  LEFT VENTRICLE PLAX 2D LVIDd:         4.90 cm  Diastology LVIDs:         3.10 cm  LV e' medial:    5.87 cm/s LV PW:         0.90 cm  LV E/e' medial:  15.5 LV IVS:        0.90 cm  LV e' lateral:   11.10 cm/s LVOT diam:     2.10 cm  LV E/e' lateral: 8.2 LV SV:         63 LV SV Index:   29 LVOT Area:     3.46 cm  RIGHT VENTRICLE RV S prime:     14.10 cm/s TAPSE (M-mode): 1.8 cm LEFT ATRIUM             Index       RIGHT ATRIUM           Index LA diam:        3.50 cm 1.63 cm/m  RA Area:     11.90 cm LA Vol (A2C):   71.1 ml 33.16 ml/m RA Volume:   25.00 ml  11.66 ml/m LA Vol (A4C):   59.4 ml 27.70 ml/m LA Biplane Vol: 64.9 ml 30.27 ml/m  AORTIC VALVE LVOT Vmax:   93.30 cm/s LVOT Vmean:  73.200 cm/s LVOT VTI:    0.181 m  AORTA Ao Root diam: 3.30 cm Ao Asc diam:  3.20 cm MITRAL VALVE MV Area (PHT): 3.31 cm    SHUNTS MV Decel Time:  229 msec    Systemic VTI:  0.18 m MV E velocity: 90.80 cm/s  Systemic Diam: 2.10 cm MV A velocity: 56.10 cm/s MV E/A ratio:  1.62 Vernal Rutan MD Electronically signed by Thurmon Fair MD Signature Date/Time: 07/14/2021/4:04:56 PM    Final     Cardiac Studies   Nuclear stress test 10/19/2019  The left ventricular ejection fraction is normal (55-65%). Nuclear stress EF: 59%. There was no ST segment deviation noted during stress. Defect 1: There is a medium defect of mild severity present in the basal inferior and mid inferior location. Findings consistent with ischemia. This is a low risk study.   Abnormal, low risk stress nuclear study with mild inferior ischemia.  Gated ejection fraction 59% with normal wall motion.       Cardiac catheterization 03/21/2018   Previously placed Prox LAD to Mid LAD stent (unknown type) is widely patent. The left ventricular systolic function is  normal. LV end diastolic pressure is normal. The left ventricular ejection fraction is 50-55% by visual estimate. There is no mitral valve regurgitation.   Preserved global LV contractility with a small region of very mild residual mid anterolateral hypocontractility.  Ejection fraction 50-55%.   No significant coronary obstructive disease with evidence for widely patent mid LAD stent after the takeoff of a first diagonal vessel, normal left circumflex and normal dominant RCA.   RECOMMENDATION: Medical therapy.  Continue DAPT in this patient status post anterior ST segment MI August 2018.  Optimize blood pressure.  Continue high potency statin therapy.     Diagnostic Dominance: Right   Left Anterior Descending  Previously placed Prox LAD to Mid LAD stent (unknown type) is widely patent.      Wall Motion                  Left Heart   Left Ventricle The left ventricular size is normal. The left ventricular systolic function is normal. LV end diastolic pressure is normal. The left ventricular ejection fraction is 50-55% by visual estimate. No regional wall motion abnormalities. There is no evidence of mitral regurgitation. There is preserved global LV function with a small region of very mild residual anterolateral hypo-contractility. Ejection fraction is 50-55%; LVEDP 15 mm Hg.    Coronary Diagrams     Diagnostic Dominance: Right        ECHO 07/14/2021    1. AV dissociation precludes detailed analysis of diastolic function, but annular tissue Doppler diastolic veloicites are normal for age. Left ventricular ejection fraction, by estimation, is 60 to 65%. The left ventricle has normal function. The left ventricle has no regional wall motion abnormalities. Left ventricular diastolic function could not be evaluated.   2. Right ventricular systolic function is normal. The right ventricular size is normal.   3. Left atrial size was mildly dilated.   4. The mitral valve is  normal in structure. No evidence of mitral valve regurgitation. No evidence of mitral stenosis.   5. The aortic valve is normal in structure. Aortic valve regurgitation is not visualized. No aortic stenosis is present.   6. The inferior vena cava is normal in size with greater than 50% respiratory variability, suggesting right atrial pressure of 3 mmHg.      Patient Profile     65 y.o. 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 Will avoid beta blockers and all other AV blocking agents going forward. I encouraged Mr. Yono to purchase a commercially available ECG monitor device such as a smart watch (he was waiting for the launch of 5th generation [employer] watch next month anyway. He can record ECG rhythm during any future symptomatic events and send to [place] via the EMR. If no groin sheath site bleeding and if he tolerates ambulation plan DC this afternoon. OK to resume all other home meds except metoprolol. - If he develops angina can increase dose of long acting nitrate. He is already on amlodipine max dose and Ranolazine 1000 mg twice daily (will correct on home meds list). - If BP high would add low dose diuretic (already on max dose amlodipine and losartan). Follow up is already scheduled in 6 weeks w PA, but will check for an earlier appt w Dr. Tresa Endo.  For questions or updates, please contact [facility] Please consult [email or web address] for contact info under        Signed, Thurmon Fair, MD  07/16/2021, 9:58 AM

## 2021-07-16 NOTE — Progress Notes (Addendum)
Progress Note  Patient Name: Patrick [name] Date of Encounter: 07/16/2021  [facility] Cardiologist: Nicki Guadalajara, MD   Subjective   Feels well  Inpatient Medications    Scheduled Meds:  atorvastatin  40 mg Oral q1800   Chlorhexidine Gluconate Cloth  6 each Topical Daily   glimepiride  2 mg Oral Q breakfast   heparin  5,000 Units Subcutaneous Q8H   insulin aspart  0-15 Units Subcutaneous TID WC   isosorbide mononitrate  90 mg Oral Daily   mouth rinse  15 mL Mouth Rinse BID   pantoprazole  40 mg Oral BID   potassium chloride  40 mEq Oral Daily   ranolazine  1,000 mg Oral BID   sodium chloride flush  3 mL Intravenous Q12H   tamsulosin  0.4 mg Oral QHS   Continuous Infusions:  sodium chloride 75 mL/hr at 07/16/21 0700   sodium chloride Stopped (07/16/21 0650)   PRN Meds: sodium chloride, acetaminophen, atropine, ondansetron (ZOFRAN) IV, sodium chloride flush   Vital Signs    Vitals:   07/16/21 0500 07/16/21 0600 07/16/21 0700 07/16/21 0752  BP: (!) 100/54 (!) 118/56 140/79   Pulse:      Resp: 15 14 14    Temp:    99 F (37.2 C)  TempSrc:    Oral  SpO2: 98% 94% 97%   Weight:      Height:        Intake/Output Summary (Last 24 hours) at 07/16/2021 0801 Last data filed at 07/16/2021 0700 Gross per 24 hour  Intake 2142.22 ml  Output 2495 ml  Net -352.78 ml   Last 3 Weights 07/14/2021 03/29/2021 03/06/2021  Weight (lbs) 210 lb 8.6 oz 203 lb 201 lb  Weight (kg) 95.5 kg 92.08 kg 91.173 kg      Telemetry    SR 80's, no AV block or bradycardia - Personally Reviewed  ECG    No new EKGs - Personally Reviewed  Physical Exam   GEN: No acute distress.   Neck: No JVD Cardiac: RRR, no murmurs, rubs, or gallops.  Respiratory: CTA b/l. GI: Soft, nontender, non-distended  MS: No edema; No deformity. Neuro:  Nonfocal  Psych: Normal affect   Labs    High Sensitivity Troponin:   Recent Labs  Lab 07/14/21 1108 07/14/21 1159 07/14/21 1418  TROPONINIHS 3 3 118*       Chemistry Recent Labs  Lab 07/14/21 1108 07/14/21 1122 07/14/21 1159 07/15/21 0157 07/16/21 0157  NA 136   < > 137 140 139  K 4.9   < > 5.5* 3.5 3.3*  CL 108  --  110 109 110  CO2 21*  --  22 22 24   GLUCOSE 224*  --  215* 110* 112*  BUN 24*  --  23 16 14   CREATININE 1.43*  --  1.49* 1.06 1.08  CALCIUM 8.0*  --  7.6* 8.5* 8.1*  PROT 5.3*  --  5.2*  --   --   ALBUMIN 3.1*  --  3.0*  --   --   AST 21  --  18  --   --   ALT 21  --  22  --   --   ALKPHOS 31*  --  29*  --   --   BILITOT 0.9  --  1.1  --   --   GFRNONAA 54*  --  51* >60 >60  ANIONGAP 7  --  5 9 5    < > =  values in this interval not displayed.     Hematology Recent Labs  Lab 07/14/21 1159 07/15/21 0157 07/16/21 0157  WBC 13.4* 10.2 7.0  RBC 3.57* 4.23 3.67*  HGB 11.4* 13.5 11.8*  HCT 33.5* 38.8* 33.8*  MCV 93.8 91.7 92.1  MCH 31.9 31.9 32.2  MCHC 34.0 34.8 34.9  RDW 12.1 12.2 12.1  PLT 168 168 142*    BNPNo results for input(s): BNP, PROBNP in the last 168 hours.   DDimer No results for input(s): DDIMER in the last 168 hours.   Radiology    CARDIAC CATHETERIZATION  Result Date: 07/14/2021 Successful placement of Temporary Transvenous Pacemaker via 6 French RV access -> rate set at 80 bpm, 5 mA (Threshold: 0.30mA; inserted to 75 cm at hub)   DG Chest Port 1 View  Result Date: 07/15/2021 CLINICAL DATA:  Sore chest.  Prior temporary pacer. EXAM: PORTABLE CHEST 1 VIEW COMPARISON:  07/14/2021. FINDINGS: Cardiomegaly with mild pulmonary venous congestion. Low lung volumes. Interim clearing of bilateral pulmonary infiltrates/edema. No focal infiltrate. No pleural effusion or pneumothorax. Degenerative change thoracic spine. IMPRESSION: 1.  Cardiomegaly with mild pulmonary venous congestion. 2.  Interim clearing of bilateral pulmonary infiltrates/edema. Electronically Signed   By: Maisie Fus  Register   On: 07/15/2021 07:32   DG Chest Port 1 View  Result Date: 07/14/2021 CLINICAL DATA:  Chest pain,  bradycardia and hypotension this morning. EXAM: PORTABLE CHEST 1 VIEW COMPARISON:  PA and lateral chest 08/04/2017. FINDINGS: Small patchy focus of airspace opacity is seen in the right lung base. Lungs otherwise clear. Heart size normal. Aortic atherosclerosis. Defibrillator pad is in place. No acute or focal bony abnormality. IMPRESSION: Small focus of right basilar airspace opacity is likely due to atelectasis. Aortic Atherosclerosis (ICD10-I70.0). Electronically Signed   By: Drusilla Kanner M.D.   On: 07/14/2021 13:46    Cardiac Studies   07/14/21: TTE IMPRESSIONS   1. AV dissociation precludes detailed analysis of diastolic function, but  annular tissue Doppler diastolic veloicites are normal for age. Left  ventricular ejection fraction, by estimation, is 60 to 65%. The left  ventricle has normal function. The left  ventricle has no regional wall motion abnormalities. Left ventricular  diastolic function could not be evaluated.   2. Right ventricular systolic function is normal. The right ventricular  size is normal.   3. Left atrial size was mildly dilated.   4. The mitral valve is normal in structure. No evidence of mitral valve  regurgitation. No evidence of mitral stenosis.   5. The aortic valve is normal in structure. Aortic valve regurgitation is  not visualized. No aortic stenosis is present.   6. The inferior vena cava is normal in size with greater than 50%  respiratory variability, suggesting right atrial pressure of 3 mmHg.  Patient Profile     65 y.o. male with a hx of CAD (STEMI 2018 w/DES), HTN, HLD, DM admitted with recurrent syncope, found in [facility]  Assessment & Plan    Recurrent dizzy spells and syncope Advanced heart block presenting 3:1 block, [facility] in the ER > emergent temp pacing wire placed Home metoprolol held  Conduction has recovered, he has not had any pacing since his pacing rate was reduced yesterday Dr. Elberta Fortis has seen and examined the patient No PPM  at this time and spoke with the patient and rational for no pacing at this time.   OK to pull wire Ambulate 3 hours post pull, d/w RN  OK to discharge from EP  perspective  Avoid nodal blocking agents   For questions or updates, please contact [facility] Please consult [email or web address] for contact info under        Signed, Sheilah Pigeon, PA-C  07/16/2021, 8:01 AM    I have seen and examined this patient with Francis Dowse.  Agree with above, note added to reflect my findings.  On exam, RRR, no murmurs.  Patient with continued one-to-one conduction.  We Oluwatoni Rotunno continue to hold beta-blocker.  We Findlay Dagher pull his temporary wire today.  If he has no further episodes of bradycardia and is able to ambulate, okay to return home with follow-up in clinic.  Wrenly Lauritsen M. Andras Grunewald MD 07/16/2021 11:31 AM

## 2021-07-25 ENCOUNTER — Ambulatory Visit (INDEPENDENT_AMBULATORY_CARE_PROVIDER_SITE_OTHER): Payer: Medicare Other | Admitting: Cardiovascular Disease

## 2021-07-25 ENCOUNTER — Other Ambulatory Visit: Payer: Self-pay

## 2021-07-25 ENCOUNTER — Encounter: Payer: Self-pay | Admitting: Cardiovascular Disease

## 2021-07-25 VITALS — BP 130/70 | HR 85 | Ht 72.0 in | Wt 215.6 lb

## 2021-07-25 DIAGNOSIS — I10 Essential (primary) hypertension: Secondary | ICD-10-CM | POA: Diagnosis not present

## 2021-07-25 DIAGNOSIS — I25119 Atherosclerotic heart disease of native coronary artery with unspecified angina pectoris: Secondary | ICD-10-CM | POA: Diagnosis not present

## 2021-07-25 DIAGNOSIS — E785 Hyperlipidemia, unspecified: Secondary | ICD-10-CM

## 2021-07-25 DIAGNOSIS — K219 Gastro-esophageal reflux disease without esophagitis: Secondary | ICD-10-CM

## 2021-07-25 DIAGNOSIS — I4510 Unspecified right bundle-branch block: Secondary | ICD-10-CM | POA: Diagnosis not present

## 2021-07-25 DIAGNOSIS — I442 Atrioventricular block, complete: Secondary | ICD-10-CM | POA: Diagnosis not present

## 2021-07-25 DIAGNOSIS — E118 Type 2 diabetes mellitus with unspecified complications: Secondary | ICD-10-CM

## 2021-07-25 MED ORDER — CLOPIDOGREL BISULFATE 75 MG PO TABS: 75.0000 mg | ORAL_TABLET | Freq: Every day | ORAL | 3 refills | Status: DC

## 2021-07-25 MED ORDER — NITROGLYCERIN 0.4 MG SL SUBL: 0.4000 mg | SUBLINGUAL_TABLET | SUBLINGUAL | 3 refills | Status: DC | PRN

## 2021-07-25 MED ORDER — ISOSORBIDE MONONITRATE ER 60 MG PO TB24: 60.0000 mg | ORAL_TABLET | Freq: Every morning | ORAL | 2 refills | Status: DC

## 2021-07-25 NOTE — Patient Instructions (Addendum)
Medication Instructions:  When you run out of Brilinta, DISCONTINUE it and START Plavix 75mg  (1 tablet) daily.   DECREASE isosorbide to 60mg  (1 tablet) in the morning. Please call the office when you need a refill.   Nitroglycerin refilled.   *If you need a refill on your cardiac medications before your next appointment, please call your pharmacy*   Lab Work: None ordered.    Testing/Procedures: None ordered.    Follow-Up: At [facility], you and your health needs are our priority.  As part of our continuing mission to provide you with exceptional heart care, we have created designated Provider Care Teams.  These Care Teams include your primary Cardiologist (physician) and Advanced Practice Providers (APPs -  Physician Assistants and Nurse Practitioners) who all work together to provide you with the care you need, when you need it.  We recommend signing up for the patient portal called "MyChart".  Sign up information is provided on this After Visit Summary.  MyChart is used to connect with patients for Virtual Visits (Telemedicine).  Patients are able to view lab/test results, encounter notes, upcoming appointments, etc.  Non-urgent messages can be sent to your provider as well.   To learn more about what you can do with MyChart, go to .    Your next appointment:   3 month(s)  The format for your next appointment:   In Person  Provider:   [facility], MD

## 2021-07-25 NOTE — Progress Notes (Signed)
Cardiology Office Note    Date:  07/31/2021   ID:  [name], DOB 05/30/1956, MRN [id number]  PCP:  Patrick Ruths, MD  Cardiologist:  Patrick Majestic, MD    History of Present Illness:  Patrick [name] is a 65 y.o. male who suffered an anterior ST segment elevation myocardial infarction on 08/04/2017. I  last saw him in April 2021  He presents for follow - up evaluation following his recent hospitalization with complete heart block.  Patrick [name] has a history of hypertension, hyperlipidemia, and diabetes mellitus.  He was admitted on 08/04/2017 with ST segment elevation anterolaterally and a code STEMI was activated.  I performed emergent cardiac catheterization which revealed total occlusion of his LAD after the first diagonal vessel.  There was a long segment of occlusion and ultimately a Resolute on a 3.038 mm stent was inserted, postdilated 3.25 mm with 100% occlusion being reduced to 0% and TIMI 0 flow been improved to TIMI 3 flow.  He developed transient heart block during the procedure which required IV atropine.  Of note, his lipid panel during that evaluation was consistent with an atherogenic dyslipidemic pattern with triglycerides of 370, VLDL 74, low HDL at 29, and his total cholesterol was 204 with LDL 101.  He was started on atorvastatin and also was on fenofibrate.  Subsequent, please fenofibrate was discontinued by his primary physician and he has been taking over-the-counter fish oil.  Repeat blood work 1 month later by his primary physician showed a total cholesterol 124, LDL 47, triglycerides were 251, HDL was 26.5.  Over the past several months, he has done well.  He denies any definitive recurrent anginal symptomatology.  He had experienced 1 vague episode of  similar sensation after working 16 hours a day for 3 days and having to go to another business trip in [place].    When I  saw him he had noticed some occasional episodes of heartburn sensation, but also has noted this  with mild exertion.  He was seen by Patrick Sims, NP on 02/28/2018.  Because of worsening heartburn symptoms and fatigue, which seemed nitrate responsive she recommended a follow-up nuclear perfusion study for reassessment.  His nuclear study  on 03/10/2018 was interpreted as a high risk study suggested ischemia involving both the inferior as well as anterior wall.  There was diffuse hypokinesis.  EF was 45%.  When I saw him in follow-up of the nuclear study I recommended definitive cardiac catheterization.  Cardiac catheterization was done on March 21, 2018. This revealed preserved global LV contractility with a small region of very mild residual mid anterolateral hypocontractility.  Ejection fraction was 50-55%.  The stent in the mid LAD was widely patent and his circumflex and dominant RCA were normal.  Medical therapy was recommended and optimization of blood pressure.  Due to lipid studies that were significantly elevated in August 2018 I recommended the addition of his Vascepa to his atorvastatin.   I saw him in May 2019 and last saw him in October 2019.  At that time he continued to do well.  At the time, he was on amlodipine 10 mg and metoprolol 25 mg twice a day.  His resting pulse was 96 and I recommended titration of metoprolol tartrate to 37.5 mg twice a day.  Laboratory on September 19, 2018 at the [place] [facility]: Total cholesterol was 101, HDL 33, LDL 50, triglycerides had improved to 88.  VLDL was 18.  He is enrolled in  a study with Dr. Ouida [name] which is an oral Victoza therapy.  Hemoglobin A1c before the study was 8.3 and 1 week ago repeat hemoglobin A1c was 6.8.     I  evaluated him in a telemedicine visit on October 11, 2019.  At that time he had told me that 3 weeks previously he went to the beach and while walking on the beach for 10 to 15 minutes began to notice a substernal chest discomfort.  He was able to walk through it but at a slower pace.  Since that time, he has experienced some  recurrent exertional chest tightness particularly if walking fast up hills.  He has not tried nitroglycerin.  He denies any rest discomfort.  He denies any palpitations.  He denies PND orthopnea.  He had recently seen Dr. Ouida [name].  He tells me during his recent evaluation his blood pressure was 131/71 and his pulse was 68.  Laboratory revealed a total cholesterol 109, triglycerides 218, VLDL 44, HDL 29.9, and LDL 36.    During theevaluation, I recommended slight titration of meds Toprol 50 mg twice a day, continue amlodipine 10 mg daily and initiated isosorbide 30 mg.  I scheduled him to undergo a nuclear perfusion study for reevaluation and further assessment of his chest pain and potential for ischemia.  When I saw him in November 2020 he admitted to some mild recurrent chest pain episodes.  He had an episode when he was lifting a refrigerator outside and noticed discomfort.  He underwent a nuclear perfusion study on October 19, 2019.  There was a suggestion of a medium defect of mild severity in the basal inferior to mid inferior wall suggestive of possible ischemia.  He did not have any ECG changes during stress and he had normal wall motion and EF at 59% on quantitative analysis.  His chest pain has some atypical features.  He wasa been taking pantoprazole for GERD.  During that evaluation, I recommended increasing isosorbide to 60 mg daily and also suggested a trial of increasing Protonix to twice a day for the next several weeks to see if there was any improvement.  I last saw him in April 2021 and over the prior 2 months he denied any major change in symptomatology.  Oftentimes he may noticed the development of some mild chest discomfort approximately 15 minutes into a walk.  He typically does not walk much longer than that so was uncertain if he was able to walk through the pain.  He denied any severe chest pain or significant dyspnea.  There was one occurrence where he had noticed a vague chest  sensation at night.  He has not been successful with weight loss.    Since I last saw him, he presented to [facility] ER on March 29, 2021 with some dizziness.  His vital signs were normal and he had extensive imaging and was sent home.  Apparently had several other occurrences of presyncope.  He currently had a syncopal spell on July 14, 2021 was found to be bradycardic and hypotensive.  He presented to [facility] ER where ECG revealed complete heart block.  He underwent insertion of a temporary pacemaker.  His beta-blocker was held and conduction recovered.  He was seen by Dr. Curt Bears who opted to defer permanent pacemaker implant.  Presently he has felt well and is no longer on any rate control medication.  He denies any anginal symptoms.  He denies significant shortness of breath.  He is still working in Control and instrumentation engineer  production.  His blood pressure at home has been elevated, and he was started on losartan HCT 100/12.5 mg in place of losartan 100 mg.  He has continued to be on amlodipine 10 mg, isosorbide 90 mg, continues to be on aspirin 81 mg and reduced dose of Brilinta 60 mg twice a day.  He continues to be on Ranexa 1000 mg twice a day.  Yesterday by his primary physician Dr. Frazier Richards.  He presents for evaluation.  Past Medical History:  Diagnosis Date   CAD in native artery    a. anterolateral STEMI 07/2017 s/p DES to prox-mid LAD, EF 55%.   GERD (gastroesophageal reflux disease)    Hyperlipidemia    Hypertension    Overactive bladder    Renal calculi    Uncontrolled diabetes mellitus ([place])     Past Surgical History:  Procedure Laterality Date   CORONARY/GRAFT ACUTE MI REVASCULARIZATION N/A 08/04/2017   Procedure: Coronary/Graft Acute MI Revascularization;  Surgeon: Troy Sine, MD;  Location: [place] [facility];  Service: Cardiovascular;  Laterality: N/A;   LEFT HEART CATH AND CORONARY ANGIOGRAPHY N/A 08/04/2017   Procedure: LEFT HEART CATH AND CORONARY ANGIOGRAPHY;  Surgeon: Troy Sine, MD;  Location: [place] [facility];  Service: Cardiovascular;  Laterality: N/A;   LEFT HEART CATH AND CORONARY ANGIOGRAPHY N/A 03/21/2018   Procedure: LEFT HEART CATH AND CORONARY ANGIOGRAPHY;  Surgeon: Troy Sine, MD;  Location: [place] [facility];  Service: Cardiovascular;  Laterality: N/A;   PROSTATE SURGERY     TEMPORARY PACEMAKER N/A 07/14/2021   Procedure: TEMPORARY PACEMAKER;  Surgeon: Leonie Man, MD;  Location: [place] [facility];  Service: Cardiovascular;  Laterality: N/A;    Current Medications: Outpatient Medications Prior to Visit  Medication Sig Dispense Refill   amLODipine (NORVASC) 10 MG tablet TAKE 1 TABLET(10 MG) BY MOUTH DAILY 90 tablet 3   aspirin (ASPIRIN LOW DOSE) 81 MG EC tablet Take 1 tablet (81 mg total) by mouth daily. 90 tablet 0   atorvastatin (LIPITOR) 40 MG tablet TAKE 1 TABLET(40 MG) BY MOUTH EVERY EVENING 90 tablet 2   BRILINTA 60 MG TABS tablet TAKE 1 TABLET BY MOUTH TWICE DAILY 180 tablet 3   Coenzyme Q10 200 MG capsule Take 200 mg by mouth daily.     glimepiride (AMARYL) 2 MG tablet Take 2 mg by mouth daily with breakfast.     Glucosamine-Chondroit-Vit C-Mn (GLUCOSAMINE 1500 COMPLEX PO) Take 1,500 mg by mouth daily.     losartan-hydrochlorothiazide (HYZAAR) 100-12.5 MG tablet Take 1 tablet by mouth daily.     Magnesium 250 MG TABS Take 250 mg by mouth daily.     metFORMIN (GLUCOPHAGE-XR) 500 MG 24 hr tablet Take 1,000 mg by mouth 2 (two) times daily.     mirabegron ER (MYRBETRIQ) 25 MG TB24 tablet TAKE 1 TABLET(25 MG) BY MOUTH DAILY 30 tablet 11   Multiple Vitamins-Minerals (ABC PLUS SENIOR PO) Take 1 tablet by mouth daily.     Omega-3 Fatty Acids (FISH OIL OMEGA-3 PO) Take 2 capsules by mouth 2 (two) times daily.      pantoprazole (PROTONIX) 40 MG tablet Take 40 mg by mouth daily.     Potassium 99 MG TABS Take 99 mg by mouth daily.     pyridOXINE (VITAMIN B-6) 50 MG tablet Take 50 mg by mouth daily.     ranolazine (RANEXA) 1000 MG SR tablet TAKE  1 TABLET(1000 MG) BY MOUTH TWICE DAILY 180 tablet 1  tamsulosin (FLOMAX) 0.4 MG CAPS capsule TAKE 1 CAPSULE(0.4 MG) BY MOUTH DAILY 90 capsule 3   vitamin C (ASCORBIC ACID) 500 MG tablet Take 500 mg by mouth daily.     vitamin E 400 UNIT capsule Take 400 Units by mouth daily.     isosorbide mononitrate (IMDUR) 60 MG 24 hr tablet TAKE 1 AND 1/2 TABLETS(90 MG) BY MOUTH DAILY 135 tablet 2   nitroGLYCERIN (NITROSTAT) 0.4 MG SL tablet Place 1 tablet (0.4 mg total) under the tongue every 5 (five) minutes x 3 doses as needed for chest pain. 25 tablet 3   losartan (COZAAR) 100 MG tablet TAKE 1 TABLET(100 MG) BY MOUTH DAILY (Patient not taking: Reported on 07/25/2021) 90 tablet 2   No facility-administered medications prior to visit.     Allergies:   Patient has no known allergies.   Social History   Socioeconomic History   Marital status: Married    Spouse name: Not on file   Number of children: Not on file   Years of education: Not on file   Highest education level: Not on file  Occupational History   Not on file  Tobacco Use   Smoking status: Never   Smokeless tobacco: Never  Substance and Sexual Activity   Alcohol use: No   Drug use: No   Sexual activity: Not on file  Other Topics Concern   Not on file  Social History Narrative   Not on file   Social Determinants of Health   Financial Resource Strain: Not on file  Food Insecurity: Not on file  Transportation Needs: Not on file  Physical Activity: Not on file  Stress: Not on file  Social Connections: Not on file     Family History:  The patient's family history includes Heart attack in his father; Heart disease in his father.   ROS General: Negative; No fevers, chills, or night sweats;  HEENT: Negative; No changes in vision or hearing, sinus congestion, difficulty swallowing Pulmonary: Positive for bronchitis Cardiovascular:   See history of present illness GI: Negative; No nausea, vomiting, diarrhea, or abdominal  pain GU: Negative; No dysuria, hematuria, or difficulty voiding Musculoskeletal: Negative; no myalgias, joint pain, or weakness Hematologic/Oncology: Negative; no easy bruising, bleeding Endocrine: Positive for diabetes Neuro: Negative; no changes in balance, headaches Skin: Negative; No rashes or skin lesions Psychiatric: Negative; No behavioral problems, depression Sleep: Negative; No awareness of snoring, daytime sleepiness, hypersomnolence, bruxism, restless legs, hypnogognic hallucinations, no cataplexy Other comprehensive 14 point system review is negative.   PHYSICAL EXAM:   BP 130/70 (BP Location: Right Arm)   Pulse 85   Ht 6' (1.829 m)   Wt 215 lb 9.6 oz (97.8 kg)   SpO2 96%   BMI 29.24 kg/m    Repeat blood pressure by me was 140/74.  Wt Readings from Last 3 Encounters:  07/25/21 215 lb 9.6 oz (97.8 kg)  07/14/21 210 lb 8.6 oz (95.5 kg)  03/29/21 203 lb (92.1 kg)     General: Alert, oriented, no distress.  Skin: normal turgor, no rashes, warm and dry HEENT: Normocephalic, atraumatic. Pupils equal round and reactive to light; sclera anicteric; extraocular muscles intact; Nose without nasal septal hypertrophy Mouth/Parynx benign; Mallinpatti scale 3 Neck: No JVD, no carotid bruits; normal carotid upstroke Lungs: clear to ausculatation and percussion; no wheezing or rales Chest wall: without tenderness to palpitation Heart: PMI not displaced, RRR, s1 s2 normal, 1/6 systolic murmur, no diastolic murmur, no rubs, gallops, thrills, or heaves Abdomen:  soft, nontender; no hepatosplenomehaly, BS+; abdominal aorta nontender and not dilated by palpation. Back: no CVA tenderness Pulses 2+ Musculoskeletal: full range of motion, normal strength, no joint deformities Extremities: no clubbing cyanosis or edema, Homan's sign negative  Neurologic: grossly nonfocal; Cranial nerves grossly wnl Psychologic: Normal mood and affect    Studies/Labs Reviewed:   July 25, 2021: NSR  at 85, RBBB, PR 192 msec  July 14, 2021: Complete heart block with RBBB, LPHB, ventricular rate 18 April 2020 ECG in ER (independently read by me): Normal sinus rhythm at 61 bpm.  Right bundle branch block with repolarization changes.  Normal intervals.  No ectopy.  November 07, 2019 EKG:  EKG is ordered today.  Normal sinus rhythm at 67 bpm.  Right bundle branch block with repolarization changes  October 17 2018 ECG (independently read by me): Normal sinus rhythm at 93 bpm.  Right bundle branch block with repolarization changes.  QTc interval 44 ms.  Apr 25, 2018 ECG (independently read by me): Normal sinus rhythm at 73 bpm.  Right bundle branch block with repolarization changes.  March 16, 2018 ECG (independently read by me): NSR at 63;RBBB with repolarization changes QTc 431 msec  November 2018 ECG (independently read by me): Normal sinus rhythm at 66 bpm.  Right bundle branch block with repolarization changes.  QTc interval 448 ms.  Recent Labs: BMP Latest Ref Rng & Units 07/16/2021 07/15/2021 07/14/2021  Glucose 70 - 99 mg/dL 112(H) 110(H) 215(H)  BUN 8 - 23 mg/dL $Remove'14 16 23  'BZwdPvq$ Creatinine 0.61 - 1.24 mg/dL 1.08 1.06 1.49(H)  BUN/Creat Ratio 10 - 24 - - -  Sodium 135 - 145 mmol/L 139 140 137  Potassium 3.5 - 5.1 mmol/L 3.3(L) 3.5 5.5(H)  Chloride 98 - 111 mmol/L 110 109 110  CO2 22 - 32 mmol/L $RemoveB'24 22 22  'YnJbSSUu$ Calcium 8.9 - 10.3 mg/dL 8.1(L) 8.5(L) 7.6(L)     Hepatic Function Latest Ref Rng & Units 07/14/2021 07/14/2021 08/04/2017  Total Protein 6.5 - 8.1 g/dL 5.2(L) 5.3(L) 6.6  Albumin 3.5 - 5.0 g/dL 3.0(L) 3.1(L) 4.1  AST 15 - 41 U/L 18 21 36  ALT 0 - 44 U/L 22 21 55  Alk Phosphatase 38 - 126 U/L 29(L) 31(L) 35(L)  Total Bilirubin 0.3 - 1.2 mg/dL 1.1 0.9 0.7  Bilirubin, Direct 0.1 - 0.5 mg/dL - - -    CBC Latest Ref Rng & Units 07/16/2021 07/15/2021 07/14/2021  WBC 4.0 - 10.5 K/uL 7.0 10.2 13.4(H)  Hemoglobin 13.0 - 17.0 g/dL 11.8(L) 13.5 11.4(L)  Hematocrit 39.0 - 52.0 % 33.8(L)  38.8(L) 33.5(L)  Platelets 150 - 400 K/uL 142(L) 168 168   Lab Results  Component Value Date   MCV 92.1 07/16/2021   MCV 91.7 07/15/2021   MCV 93.8 07/14/2021   No results found for: TSH Lab Results  Component Value Date   HGBA1C 5.9 (H) 07/14/2021     BNP No results found for: BNP  ProBNP No results found for: PROBNP   Lipid Panel     Component Value Date/Time   CHOL 79 07/14/2021 1159   TRIG 89 07/14/2021 1159   HDL 27 (L) 07/14/2021 1159   CHOLHDL 2.9 07/14/2021 1159   VLDL 18 07/14/2021 1159   LDLCALC 34 07/14/2021 1159     RADIOLOGY: CARDIAC CATHETERIZATION  Result Date: 07/14/2021 Successful placement of Temporary Transvenous Pacemaker via 6 French RV access -> rate set at 80 bpm, 5 mA (Threshold: 0.37mA; inserted to 75 cm at  hub)   DG Chest Port 1 View  Result Date: 07/15/2021 CLINICAL DATA:  Sore chest.  Prior temporary pacer. EXAM: PORTABLE CHEST 1 VIEW COMPARISON:  07/14/2021. FINDINGS: Cardiomegaly with mild pulmonary venous congestion. Low lung volumes. Interim clearing of bilateral pulmonary infiltrates/edema. No focal infiltrate. No pleural effusion or pneumothorax. Degenerative change thoracic spine. IMPRESSION: 1.  Cardiomegaly with mild pulmonary venous congestion. 2.  Interim clearing of bilateral pulmonary infiltrates/edema. Electronically Signed   By: Marcello Moores  Register   On: 07/15/2021 07:32   DG Chest Port 1 View  Result Date: 07/14/2021 CLINICAL DATA:  Chest pain, bradycardia and hypotension this morning. EXAM: PORTABLE CHEST 1 VIEW COMPARISON:  PA and lateral chest 08/04/2017. FINDINGS: Small patchy focus of airspace opacity is seen in the right lung base. Lungs otherwise clear. Heart size normal. Aortic atherosclerosis. Defibrillator pad is in place. No acute or focal bony abnormality. IMPRESSION: Small focus of right basilar airspace opacity is likely due to atelectasis. Aortic Atherosclerosis (ICD10-I70.0). Electronically Signed   By: Inge Rise M.D.   On: 07/14/2021 13:46   ECHOCARDIOGRAM COMPLETE  Result Date: 07/14/2021    ECHOCARDIOGRAM REPORT   Patient Name:   Patrick [name] Date of Exam: 07/14/2021 Medical Rec #:  [id number]    Height:       72.0 in Accession #:    [id number]   Weight:       203.0 lb Date of Birth:  [birth year]    BSA:          2.144 m Patient Age:    58 years     BP:           136/78 mmHg Patient Gender: M            HR:           80 bpm. Exam Location:  Inpatient Procedure: 2D Echo, Color Doppler and Cardiac Doppler STAT ECHO Indications:    I44.2 Complete heart block  History:        Patient has no prior history of Echocardiogram examinations.                 CAD; Risk Factors:Hypertension, Diabetes and Dyslipidemia.  Sonographer:    Raquel Sarna Senior RDCS Referring Phys: [id number] [place]  1. AV dissociation precludes detailed analysis of diastolic function, but annular tissue Doppler diastolic veloicites are normal for age. Left ventricular ejection fraction, by estimation, is 60 to 65%. The left ventricle has normal function. The left ventricle has no regional wall motion abnormalities. Left ventricular diastolic function could not be evaluated.  2. Right ventricular systolic function is normal. The right ventricular size is normal.  3. Left atrial size was mildly dilated.  4. The mitral valve is normal in structure. No evidence of mitral valve regurgitation. No evidence of mitral stenosis.  5. The aortic valve is normal in structure. Aortic valve regurgitation is not visualized. No aortic stenosis is present.  6. The inferior vena cava is normal in size with greater than 50% respiratory variability, suggesting right atrial pressure of 3 mmHg. FINDINGS  Left Ventricle: AV dissociation precludes detailed analysis of diastolic function, but annular tissue Doppler diastolic veloicites are normal for age. Left ventricular ejection fraction, by estimation, is 60 to 65%. The left ventricle has normal function.  The left ventricle has no regional wall motion abnormalities. The left ventricular internal cavity size was normal in size. There is no left ventricular hypertrophy. Abnormal (paradoxical) septal motion, consistent with RV  pacemaker. Left ventricular diastolic function could not be evaluated due to nondiagnostic images. Left ventricular diastolic function could not be evaluated. Right Ventricle: The right ventricular size is normal. No increase in right ventricular wall thickness. Right ventricular systolic function is normal. Left Atrium: Left atrial size was mildly dilated. Right Atrium: Right atrial size was normal in size. Pericardium: There is no evidence of pericardial effusion. Mitral Valve: The mitral valve is normal in structure. No evidence of mitral valve regurgitation. No evidence of mitral valve stenosis. Tricuspid Valve: The tricuspid valve is normal in structure. Tricuspid valve regurgitation is not demonstrated. No evidence of tricuspid stenosis. Aortic Valve: The aortic valve is normal in structure. Aortic valve regurgitation is not visualized. No aortic stenosis is present. Pulmonic Valve: The pulmonic valve was normal in structure. Pulmonic valve regurgitation is not visualized. No evidence of pulmonic stenosis. Aorta: The aortic root is normal in size and structure. Venous: The inferior vena cava is normal in size with greater than 50% respiratory variability, suggesting right atrial pressure of 3 mmHg. IAS/Shunts: No atrial level shunt detected by color flow Doppler. Additional Comments: A device lead is visualized in the right ventricle.  LEFT VENTRICLE PLAX 2D LVIDd:         4.90 cm  Diastology LVIDs:         3.10 cm  LV e' medial:    5.87 cm/s LV PW:         0.90 cm  LV E/e' medial:  15.5 LV IVS:        0.90 cm  LV e' lateral:   11.10 cm/s LVOT diam:     2.10 cm  LV E/e' lateral: 8.2 LV SV:         63 LV SV Index:   29 LVOT Area:     3.46 cm  RIGHT VENTRICLE RV S prime:     14.10 cm/s TAPSE  (M-mode): 1.8 cm LEFT ATRIUM             Index       RIGHT ATRIUM           Index LA diam:        3.50 cm 1.63 cm/m  RA Area:     11.90 cm LA Vol (A2C):   71.1 ml 33.16 ml/m RA Volume:   25.00 ml  11.66 ml/m LA Vol (A4C):   59.4 ml 27.70 ml/m LA Biplane Vol: 64.9 ml 30.27 ml/m  AORTIC VALVE LVOT Vmax:   93.30 cm/s LVOT Vmean:  73.200 cm/s LVOT VTI:    0.181 m  AORTA Ao Root diam: 3.30 cm Ao Asc diam:  3.20 cm MITRAL VALVE MV Area (PHT): 3.31 cm    SHUNTS MV Decel Time: 229 msec    Systemic VTI:  0.18 m MV E velocity: 90.80 cm/s  Systemic Diam: 2.10 cm MV A velocity: 56.10 cm/s MV E/A ratio:  1.62 Mihai Croitoru MD Electronically signed by Sanda Klein MD Signature Date/Time: 07/14/2021/4:04:56 PM    Final      Additional studies/ records that were reviewed today include:   Emergent cardiac catheterization/PCI 08/04/2017: Conclusion     LV end diastolic pressure is mildly elevated. The left ventricular systolic function is normal. A STENT RESOLUTE [id number] drug eluting stent was successfully placed. Prox LAD to Mid LAD lesion, 100 %stenosed. Post intervention, there is a 0% residual stenosis.   Acute ST segment elevation myocardial infarction secondary to total occlusion of the LAD immediately after the takeoff of  the first diagonal and septal perforating artery.  There was TIMI 0 flow.   Mild luminal irregularity of the left circumflex vessel without significant obstructive stenoses.  Dominant normal RCA.   Successful PCI to the totally occluded LAD which resulted in a long segment of occlusion and ultimately treated with PTCA and DES stenting with a Resolute onyx 3.038 mm stent postdilated to 3.25 mm with 100% occlusion being reduced to 0% and TIMI 0 flow being improved to TIMI 3 flow.   Transient heart block treated with IV atropine.   Preserved global LV contractility with subtle apical inferior focal hypocontractility.   The patient presented to [facility] emergency room at ~  8:45 AM.  He presented to [facility] catheterization lab at approximately 10:45 AM.  During balloon time from presentation to [facility] catheterization laboratory was ~ 20 minutes.   RECOMMENDATION: The patient will continue with dual antiplatelet therapy for minimum of a year.  He will be started on high potency statin therapy, low-dose beta blocker therapy, with ultimate ACE inhibition.  Metformin will be held for 48 hours postcontrast.     03/10/2018 Nuclear Study Highlights     The left ventricular ejection fraction is mildly decreased (45-54%). Nuclear stress EF: 45%. There was no ST segment deviation noted during stress. Findings consistent with ischemia. This is a high risk study.   TID 1.3 Normalization artifact likely but appears to be both inferior and anterior ischemia Diffuse hypokinesis EF 45%      March 21, 2018 cardiac catheterization conclusion     Previously placed Prox LAD to Mid LAD stent (unknown type) is widely patent. The left ventricular systolic function is normal. LV end diastolic pressure is normal. The left ventricular ejection fraction is 50-55% by visual estimate. There is no mitral valve regurgitation.   Preserved global LV contractility with a small region of very mild residual mid anterolateral hypocontractility.  Ejection fraction 50-55%.   No significant coronary obstructive disease with evidence for widely patent mid LAD stent after the takeoff of a first diagonal vessel, normal left circumflex and normal dominant RCA.   RECOMMENDATION: Medical therapy.  Continue DAPT in this patient status post anterior ST segment MI August 2018.  Optimize blood pressure.  Continue high potency statin therapy.      NUCLEAR STUDY 10/19/2019 The left ventricular ejection fraction is normal (55-65%). Nuclear stress EF: 59%. There was no ST segment deviation noted during stress. Defect 1: There is a medium defect of mild severity present in the basal inferior and mid  inferior location. Findings consistent with ischemia. This is a low risk study.   Abnormal, low risk stress nuclear study with mild inferior ischemia.  Gated ejection fraction 59% with normal wall motion.   ASSESSMENT:    1. Coronary artery disease involving native coronary artery of native heart with angina pectoris ([place])   2. Essential hypertension   3. RBBB   4. 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 Type 2 diabetes mellitus with complication, without long-term current use of insulin ([place])   6. Hyperlipidemia LDL goal <70   7. Chronic GERD     PLAN:  Patrick [name] is a 65 year old gentleman who has a history of hypertension, hyperlipidemia, and diabetes mellitus. On 08/04/2017 he presented with an acute anterior wall ST segment elevation myocardial infarctions and was found to have total occlusion of his LAD after the first diagonal vessel.  He was found to have a long occluded segment  which was successfully treated with DES stenting with a 3.038 mm Resolute DES stent.  A nuclear perfusion study in March 2019 suggested the possibility of ischemia involving both the anterior and inferior wall.  EF was 45% and there was diffuse hypokinesis.  With his blood pressure elevation, and nuclear study suggestive of ischemia, I added amlodipine 5 mg to his regimen and further titrated metoprolol to 37.5 mg twice a day.  He underwent definitive repeat cardiac catheterization on 03/21/2018 which revealed a widely patent mid LAD stent and a normal left circumflex and dominant RCA.  He has recently again experienced recurrent chest pain symptomatology with plus minus atypical features.  He seems to notice this within 15 minutes of walking.  Typically goes away on its own.  His most recent nuclear perfusion study was low risk and raised concern for possible mild inferior ischemia.  I suspect this most likely is diaphragmatic attenuation.  At his last catheterization his RCA and  circumflex vessels were entirely normal.  He had normal perfusion in the LAD territory and at last cath his LAD stent was widely patent.  He has not noticed any significant benefit with the transient increase of his Protonix or his isosorbide.  When I last saw him in 2021 his blood pressure was stable and he was on a regimen of  amlodipine 10 mg, isosorbide 60 mg, losartan 100 mg in addition to metoprolol tartrate 50 mg twice a day.  He has chronic right bundle branch block which is stable.  He  continued to take dual antiplatelet therapy with aspirin and low-dose Brilinta 60 mg twice a day.  During that evaluation I discussed the importance of weight loss as well as the possibility of esophageal spasm contributing to his discomfort.  We also discussed the possibility of microvascular angina and the possibility of adding ranolazine if necessary.  Since April of this year, he had had 4 episodes of apparent presyncope and on July 14, 2021 developed complete heart block with marked bradycardia, right bundle branch block and he underwent successful temporary pacemaker insertion.  With discontinuance of rate control medications, his rhythm stabilized and heart block resolved.  Presently, he is not having any anginal symptomatology.  His losartan was recently changed to losartan HCT by his primary provider due to continued blood pressure elevation at 150/75.  I have recommended he reduce his isosorbide to 60 mg.  He continues to be on ranolazine 1000 mg twice a day.  He has experienced easy bruisability and has continued to be on aspirin and low-dose Brilinta at 60 mg twice a day.  When his Brilinta runs out I have suggested to switch to generic clopidogrel 75 mg daily.  He has mild trace edema to his lower extremity and this should be improved with the addition of HCT added yesterday to his regimen.  He continues to be on atorvastatin 40 mg daily for hyperlipidemia with target LDL less than 70.  He is diabetic on  metformin.  He continues to be on Protonix for GERD.  I will see him in 3 months for reevaluation or sooner as needed.      tedication Adjustments/Labs and Tests Ordered: Current medicines are reviewed at length with the patient today.  Concerns regarding medicines are outlined above.  Medication changes, Labs and Tests ordered today are listed in the Patient Instructions below. Patient Instructions  Medication Instructions:  When you run out of Brilinta, DISCONTINUE it and START Plavix $RemoveBefo'75mg'[id number]$  (1 tablet) daily.  DECREASE isosorbide to $RemoveBefor'60mg'[id number]$  (1 tablet) in the morning. Please call the office when you need a refill.   Nitroglycerin refilled.   *If you need a refill on your cardiac medications before your next appointment, please call your pharmacy*   Lab Work: None ordered.    Testing/Procedures: None ordered.    Follow-Up: At [facility], you and your health needs are our priority.  As part of our continuing mission to provide you with exceptional heart care, we have created designated Provider Care Teams.  These Care Teams include your primary Cardiologist (physician) and Advanced Practice Providers (APPs -  Physician Assistants and Nurse Practitioners) who all work together to provide you with the care you need, when you need it.  We recommend signing up for the patient portal called "MyChart".  Sign up information is provided on this After Visit Summary.  MyChart is used to connect with patients for Virtual Visits (Telemedicine).  Patients are able to view lab/test results, encounter notes, upcoming appointments, etc.  Non-urgent messages can be sent to your provider as well.   To learn more about what you can do with MyChart, go to [email or web address].    Your next appointment:   3 month(s)  The format for your next appointment:   In Person  Provider:   Shelva Majestic, MD      Signed, Patrick Majestic, MD  07/31/2021 7:20 PM    [facility] [address] [place], [place], [place]  [address] Phone: [phone]

## 2021-07-31 ENCOUNTER — Encounter: Payer: Self-pay | Admitting: Cardiovascular Disease

## 2021-07-31 NOTE — Telephone Encounter (Addendum)
Hi Dr. Royann Shivers. I think this was meant for you or Dr. Tresa Endo but if it is something you want me to address just let me know.   Thanks! Serenitie Vinton

## 2021-08-14 ENCOUNTER — Telehealth: Payer: Self-pay | Admitting: Urology

## 2021-08-28 NOTE — Progress Notes (Signed)
Cardiology Office Note:    Date:  08/29/2021   ID:  Patrick [name], DOB July 14, 1956, MRN [id number]  PCP:  Lauro Regulus, MD  Cardiologist:  Nicki Guadalajara, MD  Electrophysiologist:  None   Referring MD: Lauro Regulus, MD   Chief Complaint: dizziness  History of Present Illness:    Patrick [name] is a 65 y.o. male with a history of CAD s/p DES to proximal to LAD with chronic chest pain, complete heart block in 06/2021 that resolved with discontinuation of beta-blocker (no PPM required), hypertension, hyperlipidemia, type 2 diabetes mellitus, and CKD stage III who is followed by Dr. Tresa Endo and presents today for further evaluation of dizziness.  Patient was admitted in 07/2017 with an acute anterolateral STEMI. Emergent cardiac catheterization showed 100% stenosis of proximal to mid LAD with otherwise only mild luminal irregularity of the LCX without significant obstructive stenoses. She underwent successful PCI with PTCA and DES to LAD. Patient has struggled with chronic chest pain since then. He underwent Myoview in 02/2018 which was considered high risk with ischemic of both the inferior wall and anterior wall. Cardiac catheterization following this showed a widely patent LAD stent with no other CAD. Continued medical therapy was recommended. At a visit in 10/2019. He continued to have recurrent exertional chest tightness particularly if walking fast up hills after this and antianginals were adjusted - Imdur was started, Toprol-XL was increased, and Amlodipine was continued. He underwent another Myoview in 09/2019 which was considered low risk with a medium defect of mild severity present in the basal inferior and mid inferior location consistent with ischemia. Dr. Tresa Endo felt this was consistent with diaphragmatic attenuation. Imdur was increased as well as Protonix. He has continued to have chest pain since then but symptoms stable. At some point, there was discussion of the possibility of  microvascular angina and he was started on Ranexa.   More recently since April of 2022 he has had problems with dizziness and presyncope and then he had and syncopal spell in 06/2021 and was found to be bradycardic and hypotension. He presented to the ED and was found to be in complete heart block. Beta-blocker was held and emergent temp wire was placed for pacing. was held and the complete heart block resolved. Echo showed LVEF of 60-65% with normal wall motion, normal RV,  and no significant valvular disease. Conduction recovered after stopping beta-blocker and he did no require further pacing. Therefore, EP opted to defer PPM implant.  He was last seen by Dr. Tresa Endo on 07/25/2021 at which time he was doing well off any rate control medications. He denied any anginal symptoms at that time. Imdur was decreased to 60mg  at that visit due to recent addition of HCTZ and he was advised to start Plavix when he ran out of prescription of Brilinta.   Patient presents today for further evaluation of recurrent dizziness.  Patient had another episode of dizziness on 08/19/2021.  He states he was sitting at his desk having a history of meeting with his son.  He began immediately, he felt fine; however, by the end of the 2-hour meeting he was feeling lightheaded and dizzy. He states he just felt off this lasted for several hours. He state this episode felt similar to previous episode in July when he was found to be in complete heart block just not as severe. These episodes occur only occasionally - he states he has had 6 episodes since April 2022. He has not had  syncope with any of these episodes since his hospitalization in July.  He denies any associated palpitations, chest pain, shortness of breath during these episodes.  He also states that during his episode he often has very difficulty concentrating and has difficulty even completing emptying.  Of note, he has had he has had extensive imaging of his head/brain since  these episodes started  which have been unremarkable.  Otherwise, he has been Well from a cardiac standpoint.  He does note occasional left upper abdomen/lower chest tightness at rest.  This does not occur frequently and patient is wondering if it could be related to certain foods.  He denies any exertional chest pain. He used to have relatively frequent chest pain with walking but this has almost completely resolved since Ranexa was increased.  He denies any significant shortness of breath, orthopnea, PND, lower extremity edema.   Past Medical History:  Diagnosis Date   CAD in native artery    a. anterolateral STEMI 07/2017 s/p DES to prox-mid LAD, EF 55%.   GERD (gastroesophageal reflux disease)    Hyperlipidemia    Hypertension    Overactive bladder    Renal calculi    Uncontrolled diabetes mellitus (HCC)     Past Surgical History:  Procedure Laterality Date   CORONARY/GRAFT ACUTE MI REVASCULARIZATION N/A 08/04/2017   Procedure: Coronary/Graft Acute MI Revascularization;  Surgeon: Lennette BihariKelly, Thomas A, MD;  Location: [facility];  Service: Cardiovascular;  Laterality: N/A;   LEFT HEART CATH AND CORONARY ANGIOGRAPHY N/A 08/04/2017   Procedure: LEFT HEART CATH AND CORONARY ANGIOGRAPHY;  Surgeon: Lennette BihariKelly, Thomas A, MD;  Location: [facility];  Service: Cardiovascular;  Laterality: N/A;   LEFT HEART CATH AND CORONARY ANGIOGRAPHY N/A 03/21/2018   Procedure: LEFT HEART CATH AND CORONARY ANGIOGRAPHY;  Surgeon: Lennette BihariKelly, Thomas A, MD;  Location: [facility];  Service: Cardiovascular;  Laterality: N/A;   PROSTATE SURGERY     TEMPORARY PACEMAKER N/A 07/14/2021   Procedure: TEMPORARY PACEMAKER;  Surgeon: Marykay LexHarding, David W, MD;  Location[facility];  Service: Cardiovascular;  Laterality: N/A;    Current Medications: Current Meds  Medication Sig   amLODipine (NORVASC) 10 MG tablet TAKE 1 TABLET(10 MG) BY MOUTH DAILY   aspirin (ASPIRIN LOW DOSE) 81 MG EC tablet Take 1 tablet (81 mg  total) by mouth daily.   atorvastatin (LIPITOR) 40 MG tablet TAKE 1 TABLET(40 MG) BY MOUTH EVERY EVENING   BRILINTA 60 MG TABS tablet TAKE 1 TABLET BY MOUTH TWICE DAILY   clopidogrel (PLAVIX) 75 MG tablet Take 1 tablet (75 mg total) by mouth daily.   Coenzyme Q10 200 MG capsule Take 200 mg by mouth daily.   Glucosamine-Chondroit-Vit C-Mn (GLUCOSAMINE 1500 COMPLEX PO) Take 1,500 mg by mouth daily.   isosorbide mononitrate (IMDUR) 60 MG 24 hr tablet Take 1 tablet (60 mg total) by mouth in the morning.   losartan-hydrochlorothiazide (HYZAAR) 100-12.5 MG tablet Take 1 tablet by mouth daily.   Magnesium 250 MG TABS Take 250 mg by mouth daily.   metFORMIN (GLUCOPHAGE-XR) 500 MG 24 hr tablet Take 1,000 mg by mouth 2 (two) times daily.   mirabegron ER (MYRBETRIQ) 25 MG TB24 tablet TAKE 1 TABLET(25 MG) BY MOUTH DAILY   Multiple Vitamins-Minerals (ABC PLUS SENIOR PO) Take 1 tablet by mouth daily.   nitroGLYCERIN (NITROSTAT) 0.4 MG SL tablet Place 1 tablet (0.4 mg total) under the tongue every 5 (five) minutes x 3 doses as needed for chest pain.  Omega-3 Fatty Acids (FISH OIL OMEGA-3 PO) Take 2 capsules by mouth 2 (two) times daily.    pantoprazole (PROTONIX) 40 MG tablet Take 40 mg by mouth daily.   Potassium 99 MG TABS Take 99 mg by mouth daily.   pyridOXINE (VITAMIN B-6) 50 MG tablet Take 50 mg by mouth daily.   ranolazine (RANEXA) 1000 MG SR tablet TAKE 1 TABLET(1000 MG) BY MOUTH TWICE DAILY   tamsulosin (FLOMAX) 0.4 MG CAPS capsule TAKE 1 CAPSULE(0.4 MG) BY MOUTH DAILY   vitamin C (ASCORBIC ACID) 500 MG tablet Take 500 mg by mouth daily.   vitamin E 400 UNIT capsule Take 400 Units by mouth daily.     Allergies:   Patient has no known allergies.   Social History   Socioeconomic History   Marital status: Married    Spouse name: Not on file   Number of children: Not on file   Years of education: Not on file   Highest education level: Not on file  Occupational History   Not on file  Tobacco  Use   Smoking status: Never   Smokeless tobacco: Never  Substance and Sexual Activity   Alcohol use: No   Drug use: No   Sexual activity: Not on file  Other Topics Concern   Not on file  Social History Narrative   Not on file   Social Determinants of Health   Financial Resource Strain: Not on file  Food Insecurity: Not on file  Transportation Needs: Not on file  Physical Activity: Not on file  Stress: Not on file  Social Connections: Not on file     Family History: The patient's family history includes Heart attack in his father; Heart disease in his father.  ROS:   Please see the history of present illness.     EKGs/Labs/Other Studies Reviewed:    The following studies were reviewed today:  Left Cardiac Catheterization 03/21/2018: Previously placed Prox LAD to Mid LAD stent (unknown type) is widely patent. The left ventricular systolic function is normal. LV end diastolic pressure is normal. The left ventricular ejection fraction is 50-55% by visual estimate. There is no mitral valve regurgitation.   Preserved global LV contractility with a small region of very mild residual mid anterolateral hypocontractility.  Ejection fraction 50-55%.   No significant coronary obstructive disease with evidence for widely patent mid LAD stent after the takeoff of a first diagonal vessel, normal left circumflex and normal dominant RCA.   Recommendation: Medical therapy.  Continue DAPT in this patient status post anterior ST segment MI August 2018.  Optimize blood pressure.  Continue high potency statin therapy.  Diagnostic Dominance: Right   _______________  Steffanie Dunn 10/19/2019: The left ventricular ejection fraction is normal (55-65%). Nuclear stress EF: 59%. There was no ST segment deviation noted during stress. Defect 1: There is a medium defect of mild severity present in the basal inferior and mid inferior location. Findings consistent with ischemia. This is a  low risk study.   Abnormal, low risk stress nuclear study with mild inferior ischemia.  Gated ejection fraction 59% with normal wall motion. _______________  Echocardiogram 07/14/2021: Impressions:  1. AV dissociation precludes detailed analysis of diastolic function, but  annular tissue Doppler diastolic veloicites are normal for age. Left  ventricular ejection fraction, by estimation, is 60 to 65%. The left  ventricle has normal function. The left  ventricle has no regional wall motion abnormalities. Left ventricular  diastolic function could not be evaluated.  2. Right ventricular systolic function is normal. The right ventricular  size is normal.   3. Left atrial size was mildly dilated.   4. The mitral valve is normal in structure. No evidence of mitral valve  regurgitation. No evidence of mitral stenosis.   5. The aortic valve is normal in structure. Aortic valve regurgitation is  not visualized. No aortic stenosis is present.   6. The inferior vena cava is normal in size with greater than 50%  respiratory variability, suggesting right atrial pressure of 3 mmHg.  EKG:  EKG ordered today. EKG personally reviewed and demonstrates normal sinus rhythm, rate 86 bpm, with known RBBB. No acute changes compared to prior tracing.   Recent Labs: 07/14/2021: ALT 22 07/16/2021: BUN 14; Creatinine, Ser 1.08; Hemoglobin 11.8; Platelets 142; Potassium 3.3; Sodium 139  Recent Lipid Panel    Component Value Date/Time   CHOL 79 07/14/2021 1159   TRIG 89 07/14/2021 1159   HDL 27 (L) 07/14/2021 1159   CHOLHDL 2.9 07/14/2021 1159   VLDL 18 07/14/2021 1159   LDLCALC 34 07/14/2021 1159    Physical Exam:    Vital Signs: Resp 20   Ht 5\' 11"  (1.803 m)   Wt 217 lb 3.2 oz (98.5 kg)   SpO2 95%   BMI 30.29 kg/m     Orthostatic Vital Signs: Supine: BP 130/68 Pulse 89 Sitting: BP 132/80 Pulse 91 Standing: BP 128/70 Pulse 94  Wt Readings from Last 3 Encounters:  08/29/21 217 lb 3.2 oz (98.5  kg)  07/25/21 215 lb 9.6 oz (97.8 kg)  07/14/21 210 lb 8.6 oz (95.5 kg)     General: 65 y.o.  Caucasian male in no acute distress. HEENT: Normocephalic and atraumatic. Sclera clear.  Neck: Supple. No carotid bruits. No JVD. Heart: RRR. Distinct S1 and S2. No murmurs, gallops, or rubs. Radial and distal pedal pulses 2+ and equal bilaterally. Lungs: No increased work of breathing. Clear to ausculation bilaterally. No wheezes, rhonchi, or rales.  Abdomen: Soft, non-distended, and non-tender to palpation.  Extremities: No lower extremity edema.    Skin: Warm and dry. Neuro: Alert and oriented x3. No focal deficits. Psych: Normal affect. Responds appropriately.   Assessment:    1. Dizziness   2. Coronary artery disease involving native coronary artery of native heart without angina pectoris   3. Primary hypertension   4. Hyperlipidemia, unspecified hyperlipidemia type   5. Type 2 diabetes mellitus with complication, without long-term current use of insulin (HCC)   6. Stage 3 chronic kidney disease, unspecified whether stage 3a or 3b CKD (HCC)   7. Hypokalemia   8. Anemia, unspecified type     Plan:    Intermittent Dizziness Patient had a recurrent episode of dizziness last week similar to his previous episodes. He has had about 6 episodes since 03/2021. During episode in 06/2021, he was noted to be in complete heart block but this resolved after beta-blocker was stopped and PPM was not felt to be needed. Vitals were stable during recurrent episode last week. EKG strips at home were also unremarkable with no evidence of tachyarrhythmia or high grade AV block. Orthostatic vital signs negative in the office today. At time of first episode in April, he had extensive imaging of head/neck which was unremarkable. Echo in in 06/2021 showed normal LV function and no significant valvular disease. I do not think his dizziness is cardiac in nature. I did offer to order an Event Monitor but since patient  is able to  check an EKG with [employer] device at home, I don't think this is necessary and patient agreed. I reviewed medications with PharmD who does not think his medications are the cause of such sporadic episodes of this (especially if patient is taking his medications consistently which he is). Will also review with Dr. Tresa Endo to make sure he does not think this is cardiac in nature. Recommend patient talk with PCP as I think he may need to be referred to Neurology or ENT.  CAD with Chronic Chest Pain - History of anterior STEMI in 2018 s/p DES to proximal to LAD. Repeat cath in 02/2018 showed widely patent stent with no other disease. Most recent Myoview in 09/2019 which was considered low risk with a medium defect of mild severity present in the basal inferior and mid inferior location consistent with ischemia. Dr. Tresa Endo felt this was consistent with diaphragmatic attenuation. There has been concern for possible microvascular disease.  - He notes occasional atypical mild left sided chest tightness at rest. No exertional pain.  - Continue Amlodipine  daily, Imdur  daily, and Ranexa  twice daily. No longer on beta blocker due to complete heart block while on Toprol-XL. - On DAPT with Aspirin and Brilinta but plan is to switch to Plavix when he finishes current prescription of Brilinta. - Continue high-intensity statin. - Symptoms do not sound like angina or ACS to be. No additional work-up necessary at this time - can continue to monitor. Patient will keep a log of symptoms and notify us if chest pain worsens.   History of Complete Heart Block - Admitted in 06/2021 with CHB requiring temp pacing wire. Resolved after discontinuation of Toprol-XL. Seen by EP at the time and PPM not felt to be necessary.  - Patient has Samsung device at home that is able to capture EKG. He has sent multiple strips via MyChart since hospitalization in 06/2021 and no recurrent high grade AV block noted.  - EKG  in office today shows normal sinus rhythm with rates in the 80s with chronic RBBB but no high grade AV block.   Hypertension - BP well controlled. - Continue current medications: Losartan-HCTZ 100-12.5, Amlodipine  daily, Imdur  daily.  Hyperlipidemia - Recent lipid panel from 06/2021: Total Cholesterol 79, Triglycerides 89, HDL 27, LDL 24. - LDL goal <70 given CAD. - Continue Lipitor  daily.  Type 2 Diabetes Mellitus - Hemoglobin A1c 5.9% in 06/2021. - Management per PCP.  CKD Stage III - Creatinine baseline around 1.0 to 1.2. Stable at 1.08 at last check in 06/2021.  Hypokalemia - Potassium 3.3 on last labs in 06/2021.  - Will recheck BMET.   Anemia - Hemoglobin 11.8 on last labs in 06/2021.  - Will recheck CBC to make sure hemoglobin is stable and not contributing to intermittent dizziness.   Disposition: Follow up in November with Dr. Tresa Endo as scheduled.   Medication Adjustments/Labs and Tests Ordered: Current medicines are reviewed at length with the patient today.  Concerns regarding medicines are outlined above.  Orders Placed This Encounter  Procedures   CBC   Basic metabolic panel   EKG 12-Lead    No orders of the defined types were placed in this encounter.   Patient Instructions  Medication Instructions:  Your physician recommends that you continue on your current medications as directed. Please refer to the Current Medication list given to you today.  *If you need a refill on your cardiac medications before your next appointment, please call your  pharmacy*  Lab Work: NONE ordered at this time of appointment   If you have labs (blood work) drawn today and your tests are completely normal, you will receive your results only by: MyChart Message (if you have MyChart) OR A paper copy in the mail If you have any lab test that is abnormal or we need to change your treatment, we will call you to review the results.  Testing/Procedures: NONE ordered  at this time of appointment   Follow-Up: At [name], you and your health needs are our priority.  As part of our continuing mission to provide you with exceptional heart care, we have created designated Provider Care Teams.  These Care Teams include your primary Cardiologist (physician) and Advanced Practice Providers (APPs -  Physician Assistants and Nurse Practitioners) who all work together to provide you with the care you need, when you need it.  Your next appointment:   Follow up as scheduled    The format for your next appointment:   In Person  Provider:   Nicki Guadalajara, MD  Other Instructions    Signed, Corrin Parker, PA-C  08/29/2021 4:44 PM    [facility]

## 2021-08-29 ENCOUNTER — Ambulatory Visit (INDEPENDENT_AMBULATORY_CARE_PROVIDER_SITE_OTHER): Payer: Medicare Other | Admitting: Student

## 2021-08-29 ENCOUNTER — Encounter: Payer: Self-pay | Admitting: Student

## 2021-08-29 ENCOUNTER — Other Ambulatory Visit: Payer: Self-pay

## 2021-08-29 VITALS — Resp 20 | Ht 71.0 in | Wt 217.2 lb

## 2021-08-29 DIAGNOSIS — I10 Essential (primary) hypertension: Secondary | ICD-10-CM

## 2021-08-29 DIAGNOSIS — I2510 Atherosclerotic heart disease of native coronary artery without angina pectoris: Secondary | ICD-10-CM | POA: Diagnosis not present

## 2021-08-29 DIAGNOSIS — Z8679 Personal history of other diseases of the circulatory system: Secondary | ICD-10-CM | POA: Diagnosis not present

## 2021-08-29 DIAGNOSIS — E118 Type 2 diabetes mellitus with unspecified complications: Secondary | ICD-10-CM

## 2021-08-29 DIAGNOSIS — D649 Anemia, unspecified: Secondary | ICD-10-CM

## 2021-08-29 DIAGNOSIS — E785 Hyperlipidemia, unspecified: Secondary | ICD-10-CM

## 2021-08-29 DIAGNOSIS — E876 Hypokalemia: Secondary | ICD-10-CM

## 2021-08-29 DIAGNOSIS — R42 Dizziness and giddiness: Secondary | ICD-10-CM

## 2021-08-29 DIAGNOSIS — N183 Chronic kidney disease, stage 3 unspecified: Secondary | ICD-10-CM

## 2021-08-29 NOTE — Patient Instructions (Signed)
Medication Instructions:  °Your physician recommends that you continue on your current medications as directed. Please refer to the Current Medication list given to you today. ° °*If you need a refill on your cardiac medications before your next appointment, please call your pharmacy* ° °Lab Work: °NONE ordered at this time of appointment  ° °If you have labs (blood work) drawn today and your tests are completely normal, you will receive your results only by: °• MyChart Message (if you have MyChart) OR °• A paper copy in the mail °If you have any lab test that is abnormal or we need to change your treatment, we will call you to review the results. ° °Testing/Procedures: °NONE ordered at this time of appointment  ° °Follow-Up: °At [facility], you and your health needs are our priority.  As part of our continuing mission to provide you with exceptional heart care, we have created designated Provider Care Teams.  These Care Teams include your primary Cardiologist (physician) and Advanced Practice Providers (APPs -  Physician Assistants and Nurse Practitioners) who all work together to provide you with the care you need, when you need it. ° °Your next appointment:   °Follow up as scheduled   ° °The format for your next appointment:   °In Person ° °Provider:   °Thomas Kelly, MD ° °Other Instructions ° ° °

## 2021-09-02 ENCOUNTER — Other Ambulatory Visit: Payer: Self-pay | Admitting: Student

## 2021-09-02 DIAGNOSIS — N179 Acute kidney failure, unspecified: Secondary | ICD-10-CM

## 2021-09-02 DIAGNOSIS — I10 Essential (primary) hypertension: Secondary | ICD-10-CM

## 2021-09-02 LAB — CBC
Hematocrit: 39.2 % (ref 37.5–51.0)
Hemoglobin: 13.4 g/dL (ref 13.0–17.7)
MCH: 31.2 pg (ref 26.6–33.0)
MCHC: 34.2 g/dL (ref 31.5–35.7)
MCV: 91 fL (ref 79–97)
Platelets: 228 10*3/uL (ref 150–450)
RBC: 4.29 x10E6/uL (ref 4.14–5.80)
RDW: 12.3 % (ref 11.6–15.4)
WBC: 6.3 10*3/uL (ref 3.4–10.8)

## 2021-09-02 LAB — BASIC METABOLIC PANEL
BUN/Creatinine Ratio: 14 (ref 10–24)
BUN: 23 mg/dL (ref 8–27)
CO2: 22 mmol/L (ref 20–29)
Calcium: 9.5 mg/dL (ref 8.6–10.2)
Chloride: 99 mmol/L (ref 96–106)
Creatinine, Ser: 1.59 mg/dL — ABNORMAL HIGH (ref 0.76–1.27)
Glucose: 169 mg/dL — ABNORMAL HIGH (ref 65–99)
Potassium: 4.2 mmol/L (ref 3.5–5.2)
Sodium: 137 mmol/L (ref 134–144)
eGFR: 47 mL/min/{1.73_m2} — ABNORMAL LOW (ref >59)

## 2021-09-02 NOTE — Progress Notes (Signed)
Ordered repeat BMET to reassess renal function after labs on 09/01/2021.  Corrin Parker, PA-C 09/02/2021 7:27 PM

## 2021-09-05 ENCOUNTER — Ambulatory Visit: Payer: Medicare Other | Admitting: Student

## 2021-09-21 ENCOUNTER — Other Ambulatory Visit: Payer: Self-pay | Admitting: Cardiovascular Disease

## 2021-09-29 NOTE — Telephone Encounter (Signed)
Opened in error

## 2021-10-08 ENCOUNTER — Encounter: Payer: Self-pay | Admitting: Neurology

## 2021-10-08 ENCOUNTER — Other Ambulatory Visit: Payer: Self-pay

## 2021-10-08 ENCOUNTER — Other Ambulatory Visit: Payer: Self-pay | Admitting: Cardiovascular Disease

## 2021-10-08 ENCOUNTER — Ambulatory Visit (INDEPENDENT_AMBULATORY_CARE_PROVIDER_SITE_OTHER): Payer: Medicare Other | Admitting: Neurology

## 2021-10-08 VITALS — BP 126/75 | HR 83 | Ht 71.0 in | Wt 219.5 lb

## 2021-10-08 DIAGNOSIS — R42 Dizziness and giddiness: Secondary | ICD-10-CM

## 2021-10-08 MED ORDER — MECLIZINE HCL 12.5 MG PO TABS: 12.5000 mg | ORAL_TABLET | Freq: Three times a day (TID) | ORAL | 0 refills | Status: DC | PRN

## 2021-10-08 NOTE — Progress Notes (Signed)
[facility]  PATIENT: Patrick [name] DOB: 11/04/1956  REFERRING CLINICIAN: Lauro Regulus, MD HISTORY FROM: Patient  REASON FOR VISIT: Recurrent syncope/near syncope    HISTORICAL  CHIEF COMPLAINT:  Chief Complaint  Patient presents with   New Patient (Initial Visit)    Room 16 - alone. Referred for syncope. Initial onset of symptoms in April 2022.  Three episodes of loss of consciousness.     HISTORY OF PRESENT ILLNESS:  This is a 65 year old gentleman with past medical history of hypertension, hyperlipidemia, CKD, diabetes, previous heart attack who is presenting with complaint of recurrent syncope and dizziness.  Patient said the first episode started in April 2022 when he has a episode of dizziness, feeling faint and fuzzy feeling in his head.  He went to the ED was observed and was told it was related to dehydration and discharged home.  Patient had another event on June 17 where he fell and hit his head.  On July 25 he had another event, fell hit his head, was taken to the ED via ambulance and in the ED was noted to have a heart rate of 30 bpm.  He was given the diagnosis of heart block and had a temporary pacemaker.  He was admitted to the cardiology service and his metoprolol was discontinued and patient have a heart monitor placed.  After discontinuation of the metoprolol his heart rate is normalized.  He was told that the bradycardia was secondary to his metoprolol.  Since being discharged from the hospital he has additional episodes of dizziness but no fainting. He describes the dizziness as lightheaded and fuzzy feeling in his head.   He did follow-up with cardiology again and was told that his recurrent dizziness did not sound cardiac in nature given normal vitals and his EKG strip on his watch was normal.  He was recommended to follow-up with neurology and ENT.   He does report hearing loss and tinnitus but said this is chronic.  OTHER MEDICAL  CONDITIONS: HTN, Heart attack, DM, HLD   REVIEW OF SYSTEMS: Full 14 system review of systems performed and negative with exception of: as noted   ALLERGIES: No Known Allergies  HOME MEDICATIONS: Outpatient Medications Prior to Visit  Medication Sig Dispense Refill   amLODipine (NORVASC) 10 MG tablet TAKE 1 TABLET(10 MG) BY MOUTH DAILY 90 tablet 3   aspirin (ASPIRIN LOW DOSE) 81 MG EC tablet Take 1 tablet (81 mg total) by mouth daily. 90 tablet 0   atorvastatin (LIPITOR) 40 MG tablet TAKE 1 TABLET(40 MG) BY MOUTH EVERY EVENING 90 tablet 2   clopidogrel (PLAVIX) 75 MG tablet Take 1 tablet (75 mg total) by mouth daily. 90 tablet 3   Coenzyme Q10 200 MG capsule Take 200 mg by mouth daily.     Glucosamine-Chondroit-Vit C-Mn (GLUCOSAMINE 1500 COMPLEX PO) Take 1,500 mg by mouth daily.     losartan-hydrochlorothiazide (HYZAAR) 100-12.5 MG tablet Take 1 tablet by mouth daily.     Magnesium 250 MG TABS Take 250 mg by mouth daily.     metFORMIN (GLUCOPHAGE-XR) 500 MG 24 hr tablet Take 1,000 mg by mouth 2 (two) times daily.     mirabegron ER (MYRBETRIQ) 25 MG TB24 tablet TAKE 1 TABLET(25 MG) BY MOUTH DAILY 30 tablet 11   Multiple Vitamins-Minerals (ABC PLUS SENIOR PO) Take 1 tablet by mouth daily.     nitroGLYCERIN (NITROSTAT) 0.4 MG SL tablet Place 1 tablet (0.4 mg total) under the tongue every 5 (  five) minutes x 3 doses as needed for chest pain. 25 tablet 3   Omega-3 Fatty Acids (FISH OIL OMEGA-3 PO) Take 2 capsules by mouth 2 (two) times daily.      pantoprazole (PROTONIX) 40 MG tablet Take 40 mg by mouth daily.     Potassium 99 MG TABS Take 99 mg by mouth daily.     pyridOXINE (VITAMIN B-6) 50 MG tablet Take 50 mg by mouth daily.     ranolazine (RANEXA) 1000 MG SR tablet TAKE 1 TABLET(1000 MG) BY MOUTH TWICE DAILY 180 tablet 1   tamsulosin (FLOMAX) 0.4 MG CAPS capsule TAKE 1 CAPSULE(0.4 MG) BY MOUTH DAILY 90 capsule 3   vitamin C (ASCORBIC ACID) 500 MG tablet Take 500 mg by mouth daily.      vitamin E 400 UNIT capsule Take 400 Units by mouth daily.     BRILINTA 60 MG TABS tablet TAKE 1 TABLET BY MOUTH TWICE DAILY 180 tablet 3   isosorbide mononitrate (IMDUR) 60 MG 24 hr tablet Take 1 tablet (60 mg total) by mouth in the morning. 135 tablet 2   glimepiride (AMARYL) 2 MG tablet Take 2 mg by mouth daily with breakfast. (Patient not taking: Reported on 08/29/2021)     No facility-administered medications prior to visit.    PAST MEDICAL HISTORY: Past Medical History:  Diagnosis Date   CAD in native artery    a. anterolateral STEMI 07/2017 s/p DES to prox-mid LAD, EF 55%.   GERD (gastroesophageal reflux disease)    Hyperlipidemia    Hypertension    Overactive bladder    Renal calculi    Syncope    Uncontrolled diabetes mellitus     PAST SURGICAL HISTORY: Past Surgical History:  Procedure Laterality Date   CORONARY/GRAFT ACUTE MI REVASCULARIZATION N/A 08/04/2017   Procedure: Coronary/Graft Acute MI Revascularization;  Surgeon: Lennette Bihari, MD;  Location: [facility] INVASIVE [facility];  Service: Cardiovascular;  Laterality: N/A;   LEFT HEART CATH AND CORONARY ANGIOGRAPHY N/A 08/04/2017   Procedure: LEFT HEART CATH AND CORONARY ANGIOGRAPHY;  Surgeon: Lennette Bihari, MD;  Location: [facility] INVAS[facility] CV [facility];  Service: Cardiovascular;  Laterality: N/A;   LEFT HEART CATH AND CORONARY ANGIOGRAPHY N/A 03/21/2018   Procedure: LEFT HEART CATH AND CORONARY ANGIOGRAPHY;  Surgeon: Lennette Bihari, MD;  Location: [facility];  Service: Cardiovascular;  Laterality: N/A;   PROSTATE SURGERY     TEMPORARY PACEMAKER N/A 07/14/2021   Procedure: TEMPORARY PACEMAKER;  Surgeon: Marykay Lex, MD;  Location: [facility] INVASIVE CV LAB;  Service: Cardiovascular;  Laterality: N/A;    FAMILY HISTORY: Family History  Problem Relation Age of Onset   Dementia Mother    Heart disease Father    Heart attack Father     SOCIAL HISTORY: Social History   Socioeconomic History   Marital status: Married    Spouse  name: Not on file   Number of children: 3   Years of education: GED   Highest education level: Not on file  Occupational History   Occupation: Editor, commissioning  Tobacco Use   Smoking status: Former    Types: Cigarettes   Smokeless tobacco: Never   Tobacco comments:    Quit smoking at age 23.  Substance and Sexual Activity   Alcohol use: No   Drug use: No   Sexual activity: Not on file  Other Topics Concern   Not on file  Social History Narrative   Lives at home with his wife.  Right-handed.   Caffeine use: 3.5 cups per day.   Social Determinants of Health   Financial Resource Strain: Not on file  Food Insecurity: Not on file  Transportation Needs: Not on file  Physical Activity: Not on file  Stress: Not on file  Social Connections: Not on file  Intimate Partner Violence: Not on file    PHYSICAL EXAM  GENERAL EXAM/CONSTITUTIONAL: Vitals:  Vitals:   10/08/21 1056  BP: 126/75  Pulse: 83  Weight: 219 lb 8 oz (99.6 kg)  Height: 5\' 11"  (1.803 m)   Body mass index is 30.61 kg/m. Wt Readings from Last 3 Encounters:  10/08/21 219 lb 8 oz (99.6 kg)  08/29/21 217 lb 3.2 oz (98.5 kg)  07/25/21 215 lb 9.6 oz (97.8 kg)   Patient is in no distress; well developed, nourished and groomed; neck is supple  CARDIOVASCULAR: Examination of carotid arteries is normal; no carotid bruits Regular rate and rhythm, no murmurs Examination of peripheral vascular system by observation and palpation is normal  EYES: Pupils round and reactive to light, Visual fields full to confrontation, Extraocular movements intacts,   MUSCULOSKELETAL: Gait, strength, tone, movements noted in Neurologic exam below  NEUROLOGIC: MENTAL STATUS:  No flowsheet data found. awake, alert, oriented to person, place and time recent and remote memory intact normal attention and concentration language fluent, comprehension intact, naming intact fund of knowledge appropriate  CRANIAL NERVE:  2nd - no  papilledema or hemorrhages on fundoscopic exam 2nd, 3rd, 4th, 6th - pupils equal and reactive to light, visual fields full to confrontation, extraocular muscles intact, no nystagmus 5th - facial sensation symmetric 7th - facial strength symmetric 8th - hearing intact 9th - palate elevates symmetrically, uvula midline 11th - shoulder shrug symmetric 12th - tongue protrusion midline  MOTOR:  normal bulk and tone, full strength in the BUE, BLE  SENSORY:  normal and symmetric to light touch, pinprick, temperature, vibration  COORDINATION:  finger-nose-finger, fine finger movements normal  REFLEXES:  deep tendon reflexes present and symmetric  GAIT/STATION:  normal     DIAGNOSTIC DATA (LABS, IMAGING, TESTING) - I reviewed patient records, labs, notes, testing and imaging myself where available.  Lab Results  Component Value Date   WBC 6.3 09/01/2021   HGB 13.4 09/01/2021   HCT 39.2 09/01/2021   MCV 91 09/01/2021   PLT 228 09/01/2021      Component Value Date/Time   NA 137 09/01/2021 1628   NA 140 05/18/2013 0959   K 4.2 09/01/2021 1628   K 4.0 05/18/2013 0959   CL 99 09/01/2021 1628   CL 104 05/18/2013 0959   CO2 22 09/01/2021 1628   CO2 29 05/18/2013 0959   GLUCOSE 169 (H) 09/01/2021 1628   GLUCOSE 112 (H) 07/16/2021 0157   GLUCOSE 142 (H) 05/18/2013 0959   BUN 23 09/01/2021 1628   BUN 27 (H) 05/18/2013 0959   CREATININE 1.59 (H) 09/01/2021 1628   CREATININE 1.03 05/18/2013 0959   CALCIUM 9.5 09/01/2021 1628   CALCIUM 9.3 05/18/2013 0959   PROT 5.2 (L) 07/14/2021 1159   ALBUMIN 3.0 (L) 07/14/2021 1159   AST 18 07/14/2021 1159   ALT 22 07/14/2021 1159   ALKPHOS 29 (L) 07/14/2021 1159   BILITOT 1.1 07/14/2021 1159   GFRNONAA >60 07/16/2021 0157   GFRNONAA >60 05/18/2013 0959   GFRAA 82 03/16/2018 1155   GFRAA >60 05/18/2013 0959   Lab Results  Component Value Date   CHOL 79 07/14/2021   HDL 27 (  L) 07/14/2021   LDLCALC 34 07/14/2021   TRIG 89  07/14/2021   CHOLHDL 2.9 07/14/2021   Lab Results  Component Value Date   HGBA1C 5.9 (H) 07/14/2021   No results found for: VITAMINB12 No results found for: TSH  Head CT 06/06/21 1. No acute intracranial abnormality. 2. Stable age related atrophy and minor chronic small vessel ischemia.   Cervical spine CT 06/06/2021 1. No acute fracture or subluxation of the cervical spine. 2. 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    ASSESSMENT AND PLAN  65 y.o. year old male with past medical history of previous heart attack in 2018, hypertension, hyperlipidemia, CKD who is presenting with episode of syncope and dizziness.  Initially he was having syncope and dizziness but after being found to be bradycardic in the 30s and discontinuation of his metoprolol he has not had any syncopal episodes, his heart rate normalized.  However he remains with episode of dizziness described as feeling faint, having a fuzzy feeling in his head but again no syncopal episode.  He has not tried any medication for the dizziness and cannot think of anything that make it worse or better.  He did have a head CT in June with no acute intracranial abnormality.  I will try him on meclizine 12.5 mg 3 times daily as needed. He will follow-up with ENT.  If he continues to have dizziness despite the medication and if cleared by ENT I will order a brain MRI for further work-up. Return in 3 months.    1. Dizziness     PLAN: Start with Meclizine 12.5 mg as needed up to 3 times daily  Follow up with ENT  Return to clinic in 3 months   No orders of the defined types were placed in this encounter.   Meds ordered this encounter  Medications   meclizine (ANTIVERT) 12.5 MG tablet    Sig: Take 1 tablet (12.5 mg total) by mouth 3 (three) times daily as needed for dizziness.    Dispense:  30 tablet    Refill:  0    Return in about 3 months (around 01/08/2022).    Windell Norfolk, MD 10/08/2021, 5:01 PM  [facility] [address] [place], [place] [address] [phone]

## 2021-10-08 NOTE — Patient Instructions (Signed)
Start with Meclizine 12.5 mg as needed up to 3 times daily  Follow up with ENT  Return to clinic in 3 months

## 2021-10-10 ENCOUNTER — Other Ambulatory Visit: Payer: Self-pay

## 2021-10-14 NOTE — Progress Notes (Signed)
Error

## 2021-10-15 ENCOUNTER — Other Ambulatory Visit: Payer: Self-pay

## 2021-10-15 ENCOUNTER — Encounter: Payer: Self-pay | Admitting: Urology

## 2021-10-15 ENCOUNTER — Ambulatory Visit (INDEPENDENT_AMBULATORY_CARE_PROVIDER_SITE_OTHER): Payer: Medicare Other | Admitting: Urology

## 2021-10-15 VITALS — BP 124/73 | HR 86 | Ht 72.0 in | Wt 212.0 lb

## 2021-10-15 DIAGNOSIS — N401 Enlarged prostate with lower urinary tract symptoms: Secondary | ICD-10-CM

## 2021-10-15 DIAGNOSIS — N138 Other obstructive and reflux uropathy: Secondary | ICD-10-CM

## 2021-10-15 DIAGNOSIS — N529 Male erectile dysfunction, unspecified: Secondary | ICD-10-CM

## 2021-10-17 ENCOUNTER — Other Ambulatory Visit: Payer: Self-pay | Admitting: Neurology

## 2021-10-20 NOTE — Progress Notes (Signed)
10/21/2021 3:46 PM   Patrick [name] 1956/04/03 [id number]  Referring provider: Lauro Regulus, MD [address] [facility] [place],  [place] [address]  Chief Complaint  Patient presents with   Benign Prostatic Hypertrophy    Urological history: 1. Elevated PSA - biopsy 2004 iPSA 6.8 negative - biopsy 2008 iPSA 6.3 negative - PSA Trend Component     Latest Ref Rng & Units 03/22/2018 09/26/2019 10/15/2020 12/26/2020  Prostate Specific Ag, Serum     0.0 - 4.0 ng/mL 3.5 3.2 4.1 (H) 4.5 (H)   Component     Latest Ref Rng & Units 05/08/2021  Prostate Specific Ag, Serum     0.0 - 4.0 ng/mL 3.1   PSA 3.87 in 09/2021  2. BPH with LU TS -~15 years ago TUMT - I PSS 15/2 - Managed with tamsulosin 0.4 mg daily  3. Pelvic floor dysfunction - documented increased EMG activity with all voids from urodynamics in 2018 - underwent PT  4. Nocturia - managed with Myrbetriq 25 mg daily - not interested in sleep study  5. ED -contributing factors of age, CAD, diabetes, HLD, HTN and former smoker -using alternative measures for sexual activity    HPI: Patrick [name] is a 65 y.o. male who presents today for yearly follow up.  He is having an increasing of the slowing of his urinary stream at night.   He has an occasional day of increased urinary frequency.  Patient denies any modifying or aggravating factors.  Patient denies any gross hematuria, dysuria or suprapubic/flank pain.  Patient denies any fevers, chills, nausea or vomiting.     IPSS     Row Name 10/21/21 1400         International Prostate Symptom Score   How often have you had the sensation of not emptying your bladder? Less than 1 in 5     How often have you had to urinate less than every two hours? About half the time     How often have you found you stopped and started again several times when you urinated? More than half the time     How often have you found it difficult to postpone urination?  Less than half the time     How often have you had a weak urinary stream? Less than half the time     How often have you had to strain to start urination? Not at All     How many times did you typically get up at night to urinate? 3 Times     Total IPSS Score 15       Quality of Life due to urinary symptoms   If you were to spend the rest of your life with your urinary condition just the way it is now how would you feel about that? Mostly Satisfied              Score:  1-7 Mild 8-19 Moderate 20-35 Severe    PMH: Past Medical History:  Diagnosis Date   CAD in native artery    a. anterolateral STEMI 07/2017 s/p DES to prox-mid LAD, EF 55%.   GERD (gastroesophageal reflux disease)    Hyperlipidemia    Hypertension    Overactive bladder    Renal calculi    Syncope    Uncontrolled diabetes mellitus     Surgical History: Past Surgical History:  Procedure Laterality Date   CORONARY/GRAFT ACUTE MI REVASCULARIZATION N/A 08/04/2017  Procedure: Coronary/Graft Acute MI Revascularization;  Surgeon: Troy Sine, MD;  Location: [facility];  Service: Cardiovascular;  Laterality: N/A;   LEFT HEART CATH AND CORONARY ANGIOGRAPHY N/A 08/04/2017   Procedure: LEFT HEART CATH AND CORONARY ANGIOGRAPHY;  Surgeon: Troy Sine, MD;  Location: [facility];  Service: Cardiovascular;  Laterality: N/A;   LEFT HEART CATH AND CORONARY ANGIOGRAPHY N/A 03/21/2018   Procedure: LEFT HEART CATH AND CORONARY ANGIOGRAPHY;  Surgeon: Troy Sine, MD;  Location: [facility];  Service: Cardiovascular;  Laterality: N/A;   PROSTATE SURGERY     TEMPORARY PACEMAKER N/A 07/14/2021   Procedure: TEMPORARY PACEMAKER;  Surgeon: Leonie Man, MD;  Location: [facility];  Service: Cardiovascular;  Laterality: N/A;    Home Medications:  Allergies as of 10/21/2021   No Known Allergies      Medication List        Accurate as of October 21, 2021  3:46 PM. If you have any  questions, ask your nurse or doctor.          ABC PLUS SENIOR PO Take 1 tablet by mouth daily.   amLODipine 10 MG tablet Commonly known as: NORVASC TAKE 1 TABLET(10 MG) BY MOUTH DAILY   aspirin 81 MG EC tablet Commonly known as: Aspirin Low Dose Take 1 tablet (81 mg total) by mouth daily.   atorvastatin 40 MG tablet Commonly known as: LIPITOR TAKE 1 TABLET(40 MG) BY MOUTH EVERY EVENING   Brilinta 60 MG Tabs tablet Generic drug: ticagrelor TAKE 1 TABLET BY MOUTH TWICE DAILY   clopidogrel 75 MG tablet Commonly known as: PLAVIX Take 1 tablet (75 mg total) by mouth daily.   Coenzyme Q10 200 MG capsule Take 200 mg by mouth daily.   FISH OIL OMEGA-3 PO Take 2 capsules by mouth 2 (two) times daily.   GLUCOSAMINE 1500 COMPLEX PO Take 1,500 mg by mouth daily.   isosorbide mononitrate 60 MG 24 hr tablet Commonly known as: IMDUR TAKE 1 AND 1/2 TABLETS(90 MG) BY MOUTH DAILY   losartan-hydrochlorothiazide 100-12.5 MG tablet Commonly known as: HYZAAR Take 1 tablet by mouth daily.   Magnesium 250 MG Tabs Take 250 mg by mouth daily.   meclizine 12.5 MG tablet Commonly known as: ANTIVERT TAKE 1 TABLET(12.5 MG) BY MOUTH THREE TIMES DAILY AS NEEDED FOR DIZZINESS   metFORMIN 500 MG 24 hr tablet Commonly known as: GLUCOPHAGE-XR Take 1,000 mg by mouth 2 (two) times daily.   mirabegron ER 25 MG Tb24 tablet Commonly known as: Myrbetriq TAKE 1 TABLET(25 MG) BY MOUTH DAILY   nitroGLYCERIN 0.4 MG SL tablet Commonly known as: NITROSTAT Place 1 tablet (0.4 mg total) under the tongue every 5 (five) minutes x 3 doses as needed for chest pain.   pantoprazole 40 MG tablet Commonly known as: PROTONIX Take 40 mg by mouth daily.   Potassium 99 MG Tabs Take 99 mg by mouth daily.   pyridOXINE 50 MG tablet Commonly known as: VITAMIN B-6 Take 50 mg by mouth daily.   ranolazine 1000 MG SR tablet Commonly known as: RANEXA TAKE 1 TABLET(1000 MG) BY MOUTH TWICE DAILY   tamsulosin  0.4 MG Caps capsule Commonly known as: FLOMAX Take 2 capsules (0.8 mg total) by mouth daily. What changed: See the new instructions. Changed by: Zara Council, PA-C   vitamin C 500 MG tablet Commonly known as: ASCORBIC ACID Take 500 mg by mouth daily.   vitamin E 180 MG (400 UNITS)  capsule Take 400 Units by mouth daily.        Allergies: No Known Allergies  Family History: Family History  Problem Relation Age of Onset   Dementia Mother    Heart disease Father    Heart attack Father     Social History:  reports that he has quit smoking. His smoking use included cigarettes. He has never used smokeless tobacco. He reports that he does not drink alcohol and does not use drugs.  ROS: For pertinent review of systems please refer to history of present illness  Physical Exam: BP 127/73   Pulse 85   Ht 6' (1.829 m)   Wt 212 lb (96.2 kg)   BMI 28.75 kg/m   Constitutional:  Well nourished. Alert and oriented, No acute distress. HEENT: [place] AT, mask in place.  Trachea midline Cardiovascular: No clubbing, cyanosis, or edema. Respiratory: Normal respiratory effort, no increased work of breathing. GU: No CVA tenderness.  No bladder fullness or masses.  Patient with circumcised phallus.   Urethral meatus is patent.  No penile discharge. No penile lesions or rashes. Scrotum without lesions, cysts, rashes and/or edema.  Testicles are located scrotally bilaterally. No masses are appreciated in the testicles. Left and right epididymis are normal. Rectal: Patient with  normal sphincter tone. Anus and perineum without scarring or rashes. No rectal masses are appreciated. Prostate is approximately 50 grams, no nodules are appreciated. Seminal vesicles could not be palpated Neurologic: Grossly intact, no focal deficits, moving all 4 extremities. Psychiatric: Normal mood and affect.   Laboratory Data: Glucose 70 - 110 mg/dL 142 High    Sodium 136 - 145 mmol/L 138   Potassium 3.6 - 5.1  mmol/L 4.2   Chloride 97 - 109 mmol/L 102   Carbon Dioxide (CO2) 22.0 - 32.0 mmol/L 28.5   Urea Nitrogen (BUN) 7 - 25 mg/dL 26 High    Creatinine 0.7 - 1.3 mg/dL 1.2   Glomerular Filtration Rate (eGFR), MDRD Estimate >60 mL/min/1.73sq m 60 Low    Calcium 8.7 - 10.3 mg/dL 9.5   AST  8 - 39 U/L 21   ALT  6 - 57 U/L 23   Alk Phos (alkaline Phosphatase) 34 - 104 U/L 46   Albumin 3.5 - 4.8 g/dL 4.8   Bilirubin, Total 0.3 - 1.2 mg/dL 0.6   Protein, Total 6.1 - 7.9 g/dL 6.8   A/G Ratio 1.0 - 5.0 gm/dL 2.4   Resulting Agency  [facility] - LAB  Specimen Collected: 10/09/21 08:27 Last Resulted: 10/09/21 12:44  Received From: [facility]  Result Received: 10/10/21 12:46   Hemoglobin A1C 4.2 - 5.6 % 6.4 High    Average Blood Glucose (Calc) mg/dL [facility] - LAB  Narrative Performed by [facility] - LAB Normal Range:    4.2 - 5.6%  Increased Risk:  5.7 - 6.4%  Diabetes:        >= 6.5%  Glycemic Control for adults with diabetes:  <7%   Specimen Collected: 10/09/21 08:27 Last Resulted: 10/09/21 09:47  Received From: [facility]  Result Received: 10/10/21 12:46   Cholesterol, Total 100 - 200 mg/dL 132   Triglyceride 35 - 199 mg/dL 110   HDL (High Density Lipoprotein) Cholesterol 29.0 - 71.0 mg/dL 39.4   LDL Calculated 0 - 130 mg/dL 71   VLDL Cholesterol mg/dL 22   Cholesterol/HDL Ratio  3.4   Resulting Agency  [facility]  Specimen Collected: 10/09/21 08:27 Last Resulted: 10/09/21 12:41  Received From: [facility]  Result Received: 10/10/21 12:46   PSA (Prostate Specific Antigen), Total 0.10 - 4.00 ng/mL 3.87   [place] - LAB  Narrative Performed by [facility] - LAB Test results were determined with Beckman Coulter Hybritech Assay. Values obtained with different assay methods cannot be used interchangeably in serial testing.  Assay results should not be interpreted as absolute evidence of the presence or absence of malignant disease Specimen Collected: 10/09/21 08:27 Last Resulted: 10/09/21 15:17  Received From: [facility]  Result Received: 10/10/21 12:46  I have reviewed the labs.   Assessment & Plan:    1. Elevated PSA -PSA at normal levels -follow up PSA in 6 months  2.  Pelvic floor dysfunction -continue relaxation techniques  3. BPH with LUTS -PSA stable -DRE benign -symptoms - weak urinary stream  --continue conservative management, avoiding bladder irritants and timed voiding's -increase to two tamsulosin 0.4 mg daily -discussed undergoing further evaluation for a bladder outlet procedure, but he deferred cited possible ejaculatory issues after the procedure   4.  Nocturia -Myrbetriq 25 mg is becoming cost prohibitive -have a trial of Myrbetriq 50 mg every other day to see if this offsets his nocturia as Myrbetriq has a longer half life -Myrbetriq 50 mg, # 28 samples given  4.  Erectile dysfunction -using alternative methods  Return in about 6 months (around 04/20/2022) for PSA only .  Zara Council, PA-C  [facility] [address] [place], [place] [address] [phone]

## 2021-10-21 ENCOUNTER — Encounter: Payer: Self-pay | Admitting: Urology

## 2021-10-21 ENCOUNTER — Other Ambulatory Visit: Payer: Self-pay

## 2021-10-21 ENCOUNTER — Ambulatory Visit (INDEPENDENT_AMBULATORY_CARE_PROVIDER_SITE_OTHER): Payer: Medicare Other | Admitting: Urology

## 2021-10-21 VITALS — BP 127/73 | HR 85 | Ht 72.0 in | Wt 212.0 lb

## 2021-10-21 DIAGNOSIS — N138 Other obstructive and reflux uropathy: Secondary | ICD-10-CM

## 2021-10-21 DIAGNOSIS — N529 Male erectile dysfunction, unspecified: Secondary | ICD-10-CM

## 2021-10-21 DIAGNOSIS — I25119 Atherosclerotic heart disease of native coronary artery with unspecified angina pectoris: Secondary | ICD-10-CM

## 2021-10-21 DIAGNOSIS — R351 Nocturia: Secondary | ICD-10-CM | POA: Diagnosis not present

## 2021-10-21 DIAGNOSIS — R350 Frequency of micturition: Secondary | ICD-10-CM | POA: Diagnosis not present

## 2021-10-21 DIAGNOSIS — N401 Enlarged prostate with lower urinary tract symptoms: Secondary | ICD-10-CM

## 2021-10-21 MED ORDER — TAMSULOSIN HCL 0.4 MG PO CAPS: 0.8000 mg | ORAL_CAPSULE | Freq: Every day | ORAL | 3 refills | Status: DC

## 2021-11-07 ENCOUNTER — Encounter: Payer: Self-pay | Admitting: Urology

## 2021-11-07 NOTE — Progress Notes (Signed)
[employer] Loss adjuster, chartered.

## 2021-11-10 ENCOUNTER — Ambulatory Visit: Payer: Medicare Other | Admitting: Neurology

## 2021-11-18 ENCOUNTER — Ambulatory Visit (INDEPENDENT_AMBULATORY_CARE_PROVIDER_SITE_OTHER): Payer: Medicare Other | Admitting: Cardiovascular Disease

## 2021-11-18 ENCOUNTER — Other Ambulatory Visit: Payer: Self-pay

## 2021-11-18 ENCOUNTER — Encounter: Payer: Self-pay | Admitting: Cardiovascular Disease

## 2021-11-18 VITALS — BP 124/80 | HR 79 | Ht 72.0 in | Wt 222.0 lb

## 2021-11-18 DIAGNOSIS — I10 Essential (primary) hypertension: Secondary | ICD-10-CM | POA: Diagnosis not present

## 2021-11-18 DIAGNOSIS — E785 Hyperlipidemia, unspecified: Secondary | ICD-10-CM

## 2021-11-18 DIAGNOSIS — Z8679 Personal history of other diseases of the circulatory system: Secondary | ICD-10-CM

## 2021-11-18 DIAGNOSIS — E118 Type 2 diabetes mellitus with unspecified complications: Secondary | ICD-10-CM

## 2021-11-18 DIAGNOSIS — I2510 Atherosclerotic heart disease of native coronary artery without angina pectoris: Secondary | ICD-10-CM | POA: Diagnosis not present

## 2021-11-18 DIAGNOSIS — I4510 Unspecified right bundle-branch block: Secondary | ICD-10-CM | POA: Diagnosis not present

## 2021-11-18 DIAGNOSIS — R42 Dizziness and giddiness: Secondary | ICD-10-CM

## 2021-11-18 NOTE — Progress Notes (Signed)
Cardiology Office Note    Date:  11/26/2021   ID:  Patrick [name], DOB May 16, 1956, MRN [id number]  PCP:  Patrick Ruths, MD  Cardiologist:  Patrick Majestic, MD    History of Present Illness:  Patrick [name] is a 65 y.o. male who suffered an anterior ST segment elevation myocardial infarction on 08/04/2017. I  last saw him in April 2021  He presents for 4 month follow-up evaluation.  Patrick [name] has a history of hypertension, hyperlipidemia, and diabetes mellitus.  He was admitted on 08/04/2017 with ST segment elevation anterolaterally and a code STEMI was activated.  I performed emergent cardiac catheterization which revealed total occlusion of his LAD after the first diagonal vessel.  There was a long segment of occlusion and ultimately a Resolute on a 3.038 mm stent was inserted, postdilated 3.25 mm with 100% occlusion being reduced to 0% and TIMI 0 flow been improved to TIMI 3 flow.  He developed transient heart block during the procedure which required IV atropine.  Of note, his lipid panel during that evaluation was consistent with an atherogenic dyslipidemic pattern with triglycerides of 370, VLDL 74, low HDL at 29, and his total cholesterol was 204 with LDL 101.  He was started on atorvastatin and also was on fenofibrate.  Subsequent, please fenofibrate was discontinued by his primary physician and he has been taking over-the-counter fish oil.  Repeat blood work 1 month later by his primary physician showed a total cholesterol 124, LDL 47, triglycerides were 251, HDL was 26.5.  Over the past several months, he has done well.  He denies any definitive recurrent anginal symptomatology.  He had experienced 1 vague episode of  similar sensation after working 16 hours a day for 3 days and having to go to another business trip in [place].    When I  saw him he had noticed some occasional episodes of heartburn sensation, but also has noted this with mild exertion.  He was seen by Patrick Sims, NP  on 02/28/2018.  Because of worsening heartburn symptoms and fatigue, which seemed nitrate responsive she recommended a follow-up nuclear perfusion study for reassessment.  His nuclear study  on 03/10/2018 was interpreted as a high risk study suggested ischemia involving both the inferior as well as anterior wall.  There was diffuse hypokinesis.  EF was 45%.  When I saw him in follow-up of the nuclear study I recommended definitive cardiac catheterization.  Cardiac catheterization was done on March 21, 2018. This revealed preserved global LV contractility with a small region of very mild residual mid anterolateral hypocontractility.  Ejection fraction was 50-55%.  The stent in the mid LAD was widely patent and his circumflex and dominant RCA were normal.  Medical therapy was recommended and optimization of blood pressure.  Due to lipid studies that were significantly elevated in August 2018 I recommended the addition of his Vascepa to his atorvastatin.   I saw him in May 2019 and last saw him in October 2019.  At that time he continued to do well.  At the time, he was on amlodipine 10 mg and metoprolol 25 mg twice a day.  His resting pulse was 96 and I recommended titration of metoprolol tartrate to 37.5 mg twice a day.  Laboratory on September 19, 2018 at the [place] [facility]: Total cholesterol was 101, HDL 33, LDL 50, triglycerides had improved to 88.  VLDL was 18.  He is enrolled in a study with Dr. Ouida [name] which is an  oral Victoza therapy.  Hemoglobin A1c before the study was 8.3 and 1 week ago repeat hemoglobin A1c was 6.8.     I evaluated him in a telemedicine visit on October 11, 2019.  At that time he had told me that 3 weeks previously he went to the beach and while walking on the beach for 10 to 15 minutes began to notice a substernal chest discomfort.  He was able to walk through it but at a slower pace.  Since that time, he has experienced some recurrent exertional chest tightness particularly if  walking fast up hills.  He has not tried nitroglycerin.  He denies any rest discomfort.  He denies any palpitations.  He denies PND orthopnea.  He had recently seen Dr. Ouida [name].  He tells me during his recent evaluation his blood pressure was 131/71 and his pulse was 68.  Laboratory revealed a total cholesterol 109, triglycerides 218, VLDL 44, HDL 29.9, and LDL 36.    During theevaluation, I recommended slight titration of meds Toprol 50 mg twice a day, continue amlodipine 10 mg daily and initiated isosorbide 30 mg.  I scheduled him to undergo a nuclear perfusion study for reevaluation and further assessment of his chest pain and potential for ischemia.  When I saw him in November 2020 he admitted to some mild recurrent chest pain episodes.  He had an episode when he was lifting a refrigerator outside and noticed discomfort.  He underwent a nuclear perfusion study on October 19, 2019.  There was a suggestion of a medium defect of mild severity in the basal inferior to mid inferior wall suggestive of possible ischemia.  He did not have any ECG changes during stress and he had normal wall motion and EF at 59% on quantitative analysis.  His chest pain has some atypical features.  He wasa been taking pantoprazole for GERD.  During that evaluation, I recommended increasing isosorbide to 60 mg daily and also suggested a trial of increasing Protonix to twice a day for the next several weeks to see if there was any improvement.  I saw him in April 2021 and over the prior 2 months he denied any major change in symptomatology.  Oftentimes he may noticed the development of some mild chest discomfort approximately 15 minutes into a walk.  He typically does not walk much longer than that so was uncertain if he was able to walk through the pain.  He denied any severe chest pain or significant dyspnea.  There was one occurrence where he had noticed a vague chest sensation at night.  He has not been successful with weight  loss.    Since his prior evaluation, he presented to [facility] ER on March 29, 2021 with dizziness.  His vital signs were normal and he had extensive imaging and was sent home.  Apparently he had several other occurrences of presyncope and had a syncopal spell on July 14, 2021 and was found to be bradycardic and hypotensive.  He presented to [facility] ER where ECG revealed complete heart block.  He underwent insertion of a temporary pacemaker.  His beta-blocker was held and conduction recovered.  He was seen by Dr. Curt [name] who opted to defer permanent pacemaker implant.  When I saw him in follow-up on July 25, 2021 he felt well and was no longer on rate control medication.  He denied any anginal symptomatology or shortness of breath.  He is still working in Building control surveyor.  His blood pressure at home has  been elevated, and he was started on losartan HCT 100/12.5 mg in place of losartan 100 mg.  He continued to be on amlodipine 10 mg, isosorbide 90 mg, continues to be on aspirin 81 mg and reduced dose of Brilinta 60 mg twice a day.  He continues to be on Ranexa 1000 mg twice a day.  He was seen by his primary physician Patrick [name] today prior to my evaluation.  During that evaluation he remained stable and had not had any episodes of chest discomfort or dizziness.  He was experiencing easy bruisability on low-dose aspirin and Brilinta 60 mg twice a day and I suggested switching to generic clopidogrel 75 mg daily once he finishes current Brilinta pills.  Since I last saw him, he was evaluated by Patrick [name] in September 2022 after he had experienced another episode of dizziness on August 19, 2021.  EKG showed sinus rhythm with rates in the 80s with chronic right bundle branch block but no high-grade AV block.  At the time his blood pressure was well controlled.  Presently, he states he has felt well and has not had any dizziness over the last month.  He had been evaluated by neurologist and was given  a prescription for meclizine to take as needed.  He denies any recurrent chest pain and is able to walk without discomfort.  He has follow-up scheduled with neurology.  He also had worn a 3-day monitor by Patrick [name] which apparently did not reveal any major findings.  He presents for evaluation.   Past Medical History:  Diagnosis Date   CAD in native artery    a. anterolateral STEMI 07/2017 s/p DES to prox-mid LAD, EF 55%.   GERD (gastroesophageal reflux disease)    Hyperlipidemia    Hypertension    Overactive bladder    Renal calculi    Syncope    Uncontrolled diabetes mellitus     Past Surgical History:  Procedure Laterality Date   CORONARY/GRAFT ACUTE MI REVASCULARIZATION N/A 08/04/2017   Procedure: Coronary/Graft Acute MI Revascularization;  Surgeon: Troy Sine, MD;  Location: [place] [facility];  Service: Cardiovascular;  Laterality: N/A;   LEFT HEART CATH AND CORONARY ANGIOGRAPHY N/A 08/04/2017   Procedure: LEFT HEART CATH AND CORONARY ANGIOGRAPHY;  Surgeon: Troy Sine, MD;  Location: [place] [facility];  Service: Cardiovascular;  Laterality: N/A;   LEFT HEART CATH AND CORONARY ANGIOGRAPHY N/A 03/21/2018   Procedure: LEFT HEART CATH AND CORONARY ANGIOGRAPHY;  Surgeon: Troy Sine, MD;  Location: [place] [facility];  Service: Cardiovascular;  Laterality: N/A;   PROSTATE SURGERY     TEMPORARY PACEMAKER N/A 07/14/2021   Procedure: TEMPORARY PACEMAKER;  Surgeon: Leonie Man, MD;  Location: [place] [facility];  Service: Cardiovascular;  Laterality: N/A;    Current Medications: Outpatient Medications Prior to Visit  Medication Sig Dispense Refill   amLODipine (NORVASC) 10 MG tablet TAKE 1 TABLET(10 MG) BY MOUTH DAILY 90 tablet 3   aspirin (ASPIRIN LOW DOSE) 81 MG EC tablet Take 1 tablet (81 mg total) by mouth daily. 90 tablet 0   atorvastatin (LIPITOR) 40 MG tablet TAKE 1 TABLET(40 MG) BY MOUTH EVERY EVENING 90 tablet 2   clopidogrel (PLAVIX) 75 MG tablet  Take 1 tablet (75 mg total) by mouth daily. 90 tablet 3   Coenzyme Q10 200 MG capsule Take 200 mg by mouth daily.     Glucosamine-Chondroit-Vit C-Mn (GLUCOSAMINE 1500 COMPLEX PO) Take 1,500 mg by mouth  daily.     isosorbide mononitrate (IMDUR) 60 MG 24 hr tablet Take 60 mg by mouth daily.     losartan-hydrochlorothiazide (HYZAAR) 100-12.5 MG tablet Take 1 tablet by mouth daily.     Magnesium 250 MG TABS Take 250 mg by mouth daily.     meclizine (ANTIVERT) 12.5 MG tablet TAKE 1 TABLET(12.5 MG) BY MOUTH THREE TIMES DAILY AS NEEDED FOR DIZZINESS 30 tablet 0   metFORMIN (GLUCOPHAGE-XR) 500 MG 24 hr tablet Take 1,000 mg by mouth 2 (two) times daily.     mirabegron ER (MYRBETRIQ) 25 MG TB24 tablet TAKE 1 TABLET(25 MG) BY MOUTH DAILY 30 tablet 11   Multiple Vitamins-Minerals (ABC PLUS SENIOR PO) Take 1 tablet by mouth daily.     nitroGLYCERIN (NITROSTAT) 0.4 MG SL tablet Place 1 tablet (0.4 mg total) under the tongue every 5 (five) minutes x 3 doses as needed for chest pain. 25 tablet 3   nystatin cream (MYCOSTATIN) Apply 1 application topically 2 (two) times daily.     Omega-3 Fatty Acids (FISH OIL OMEGA-3 PO) Take 2 capsules by mouth 2 (two) times daily.      pantoprazole (PROTONIX) 40 MG tablet Take 40 mg by mouth daily.     Potassium 99 MG TABS Take 99 mg by mouth daily.     pyridOXINE (VITAMIN B-6) 50 MG tablet Take 50 mg by mouth daily.     ranolazine (RANEXA) 1000 MG SR tablet TAKE 1 TABLET(1000 MG) BY MOUTH TWICE DAILY 180 tablet 1   tamsulosin (FLOMAX) 0.4 MG CAPS capsule Take 2 capsules (0.8 mg total) by mouth daily. 180 capsule 3   triamcinolone cream (KENALOG) 0.1 % SMARTSIG:1 Application Topical 2-3 Times Daily     vitamin C (ASCORBIC ACID) 500 MG tablet Take 500 mg by mouth daily.     vitamin E 400 UNIT capsule Take 400 Units by mouth daily.     BRILINTA 60 MG TABS tablet TAKE 1 TABLET BY MOUTH TWICE DAILY 180 tablet 3   isosorbide mononitrate (IMDUR) 60 MG 24 hr tablet TAKE 1 AND 1/2  TABLETS(90 MG) BY MOUTH DAILY 135 tablet 2   No facility-administered medications prior to visit.     Allergies:   Patient has no known allergies.   Social History   Socioeconomic History   Marital status: Married    Spouse name: Not on file   Number of children: 3   Years of education: GED   Highest education level: Not on file  Occupational History   Occupation: Energy manager  Tobacco Use   Smoking status: Former    Types: Cigarettes   Smokeless tobacco: Never   Tobacco comments:    Quit smoking at age 76.  Substance and Sexual Activity   Alcohol use: No   Drug use: No   Sexual activity: Not on file  Other Topics Concern   Not on file  Social History Narrative   Lives at home with his wife.   Right-handed.   Caffeine use: 3.5 cups per day.   Social Determinants of Health   Financial Resource Strain: Not on file  Food Insecurity: Not on file  Transportation Needs: Not on file  Physical Activity: Not on file  Stress: Not on file  Social Connections: Not on file     Family History:  The patient's family history includes Dementia in his mother; Heart attack in his father; Heart disease in his father.   ROS General: Negative; No fevers, chills, or night sweats;  HEENT: Negative; No changes in vision or hearing, sinus congestion, difficulty swallowing Pulmonary: Positive for bronchitis Cardiovascular:   See history of present illness GI: Negative; No nausea, vomiting, diarrhea, or abdominal pain GU: Negative; No dysuria, hematuria, or difficulty voiding Musculoskeletal: Negative; no myalgias, joint pain, or weakness Hematologic/Oncology: Negative; no easy bruising, bleeding Endocrine: Positive for diabetes Neuro: Negative; no changes in balance, headaches Skin: Negative; No rashes or skin lesions Psychiatric: Negative; No behavioral problems, depression Sleep: Negative; No awareness of snoring, daytime sleepiness, hypersomnolence, bruxism, restless legs,  hypnogognic hallucinations, no cataplexy Other comprehensive 14 point system review is negative.   PHYSICAL EXAM:   BP 124/80   Pulse 79   Ht 6' (1.829 m)   Wt 222 lb (100.7 kg)   SpO2 98%   BMI 30.11 kg/m    Repeat blood pressure by me was 124/70 supine and 122/68 standing  Wt Readings from Last 3 Encounters:  11/18/21 222 lb (100.7 kg)  10/21/21 212 lb (96.2 kg)  10/15/21 212 lb (96.2 kg)    General: Alert, oriented, no distress.  Skin: normal turgor, no rashes, warm and dry HEENT: Normocephalic, atraumatic. Pupils equal round and reactive to light; sclera anicteric; extraocular muscles intact;  Nose without nasal septal hypertrophy Mouth/Parynx benign; Mallinpatti scale 3 Neck: No JVD, no carotid bruits; normal carotid upstroke Lungs: clear to ausculatation and percussion; no wheezing or rales Chest wall: without tenderness to palpitation Heart: PMI not displaced, RRR, s1 s2 normal, 1/6 systolic murmur, no diastolic murmur, no rubs, gallops, thrills, or heaves Abdomen: soft, nontender; no hepatosplenomehaly, BS+; abdominal aorta nontender and not dilated by palpation. Back: no CVA tenderness Pulses 2+ Musculoskeletal: full range of motion, normal strength, no joint deformities Extremities: no clubbing cyanosis or edema, Homan's sign negative  Neurologic: grossly nonfocal; Cranial nerves grossly wnl Psychologic: Normal mood and affect   Studies/Labs Reviewed:   November 18, 2021 ECG (independently read by me):  NSR at 79, RBBB with repolarization  July 25, 2021: NSR at 85, RBBB, PR 192 msec  July 14, 2021: Complete heart block with RBBB, LPHB, ventricular rate 18 April 2020 ECG in ER (independently read by me): Normal sinus rhythm at 61 bpm.  Right bundle branch block with repolarization changes.  Normal intervals.  No ectopy.  November 07, 2019 EKG:  EKG is ordered today.  Normal sinus rhythm at 67 bpm.  Right bundle branch block with repolarization  changes  October 17 2018 ECG (independently read by me): Normal sinus rhythm at 93 bpm.  Right bundle branch block with repolarization changes.  QTc interval 44 ms.  Apr 25, 2018 ECG (independently read by me): Normal sinus rhythm at 73 bpm.  Right bundle branch block with repolarization changes.  March 16, 2018 ECG (independently read by me): NSR at 63;RBBB with repolarization changes QTc 431 msec  November 2018 ECG (independently read by me): Normal sinus rhythm at 66 bpm.  Right bundle branch block with repolarization changes.  QTc interval 448 ms.  Recent Labs: BMP Latest Ref Rng & Units 09/01/2021 07/16/2021 07/15/2021  Glucose 65 - 99 mg/dL 169(H) 112(H) 110(H)  BUN 8 - 27 mg/dL _0 Creatinine 0.76 - 1.27 mg/dL 1.59(H) 1.08 1.06  BUN/Creat Ratio 10 - 24 14 - -  Sodium 134 - 144 mmol/L 137 139 140  Potassium 3.5 - 5.2 mmol/L 4.2 3.3(L) 3.5  Chloride 96 - 106 mmol/L 99 110 109  CO2 20 - 29 mmol/L _1 Calcium  8.6 - 10.2 mg/dL 9.5 8.1(L) 8.5(L)     Hepatic Function Latest Ref Rng & Units 07/14/2021 07/14/2021 08/04/2017  Total Protein 6.5 - 8.1 g/dL 5.2(L) 5.3(L) 6.6  Albumin 3.5 - 5.0 g/dL 3.0(L) 3.1(L) 4.1  AST 15 - 41 U/L 18 21 36  ALT 0 - 44 U/L 22 21 55  Alk Phosphatase 38 - 126 U/L 29(L) 31(L) 35(L)  Total Bilirubin 0.3 - 1.2 mg/dL 1.1 0.9 0.7  Bilirubin, Direct 0.1 - 0.5 mg/dL - - -    CBC Latest Ref Rng & Units 09/01/2021 07/16/2021 07/15/2021  WBC 3.4 - 10.8 x10E3/uL 6.3 7.0 10.2  Hemoglobin 13.0 - 17.7 g/dL 13.4 11.8(L) 13.5  Hematocrit 37.5 - 51.0 % 39.2 33.8(L) 38.8(L)  Platelets 150 - 450 x10E3/uL 228 142(L) 168   Lab Results  Component Value Date   MCV 91 09/01/2021   MCV 92.1 07/16/2021   MCV 91.7 07/15/2021   No results found for: TSH Lab Results  Component Value Date   HGBA1C 5.9 (H) 07/14/2021     BNP No results found for: BNP  ProBNP No results found for: PROBNP   Lipid Panel     Component Value Date/Time   CHOL 79 07/14/2021 1159    TRIG 89 07/14/2021 1159   HDL 27 (L) 07/14/2021 1159   CHOLHDL 2.9 07/14/2021 1159   VLDL 18 07/14/2021 1159   LDLCALC 34 07/14/2021 1159     RADIOLOGY: No results found.   Additional studies/ records that were reviewed today include:   Emergent cardiac catheterization/PCI 08/04/2017: Conclusion     LV end diastolic pressure is mildly elevated. The left ventricular systolic function is normal. A STENT RESOLUTE VBTY6.0A00 drug eluting stent was successfully placed. Prox LAD to Mid LAD lesion, 100 %stenosed. Post intervention, there is a 0% residual stenosis.   Acute ST segment elevation myocardial infarction secondary to total occlusion of the LAD immediately after the takeoff of the first diagonal and septal perforating artery.  There was TIMI 0 flow.   Mild luminal irregularity of the left circumflex vessel without significant obstructive stenoses.  Dominant normal RCA.   Successful PCI to the totally occluded LAD which resulted in a long segment of occlusion and ultimately treated with PTCA and DES stenting with a Resolute onyx 3.038 mm stent postdilated to 3.25 mm with 100% occlusion being reduced to 0% and TIMI 0 flow being improved to TIMI 3 flow.   Transient heart block treated with IV atropine.   Preserved global LV contractility with subtle apical inferior focal hypocontractility.   The patient presented to [facility] emergency room at ~ 8:45 AM.  He presented to [facility] catheterization lab at approximately 10:45 AM.  During balloon time from presentation to [facility] catheterization laboratory was ~ 20 minutes.   RECOMMENDATION: The patient will continue with dual antiplatelet therapy for minimum of a year.  He will be started on high potency statin therapy, low-dose beta blocker therapy, with ultimate ACE inhibition.  Metformin will be held for 48 hours postcontrast.     03/10/2018 Nuclear Study Highlights     The left ventricular ejection fraction is mildly decreased  (45-54%). Nuclear stress EF: 45%. There was no ST segment deviation noted during stress. Findings consistent with ischemia. This is a high risk study.   TID 1.3 Normalization artifact likely but appears to be both inferior and anterior ischemia Diffuse hypokinesis EF 45%      March 21, 2018 cardiac catheterization conclusion  Previously placed Prox LAD to Mid LAD stent (unknown type) is widely patent. The left ventricular systolic function is normal. LV end diastolic pressure is normal. The left ventricular ejection fraction is 50-55% by visual estimate. There is no mitral valve regurgitation.   Preserved global LV contractility with a small region of very mild residual mid anterolateral hypocontractility.  Ejection fraction 50-55%.   No significant coronary obstructive disease with evidence for widely patent mid LAD stent after the takeoff of a first diagonal vessel, normal left circumflex and normal dominant RCA.   RECOMMENDATION: Medical therapy.  Continue DAPT in this patient status post anterior ST segment MI August 2018.  Optimize blood pressure.  Continue high potency statin therapy.      NUCLEAR STUDY 10/19/2019 The left ventricular ejection fraction is normal (55-65%). Nuclear stress EF: 59%. There was no ST segment deviation noted during stress. Defect 1: There is a medium defect of mild severity present in the basal inferior and mid inferior location. Findings consistent with ischemia. This is a low risk study.   Abnormal, low risk stress nuclear study with mild inferior ischemia.  Gated ejection fraction 59% with normal wall motion.   ASSESSMENT:    1. CAD in native artery   2. Primary hypertension   3. RBBB   4. History of complete heart block   5. Dizziness   6. Hyperlipidemia LDL goal <70   7. Type 2 diabetes mellitus with complication, without long-term current use of insulin Patrick [name])     PLAN:  Patrick [name] is a 65 year old gentleman who has a  history of hypertension, hyperlipidemia, and diabetes mellitus. On 08/04/2017 he presented with an acute anterior wall ST segment elevation myocardial infarctions and was found to have total occlusion of his LAD after the first diagonal vessel.  He was found to have a long occluded segment which was successfully treated with DES stenting with a 3.038 mm Resolute DES stent.  A nuclear perfusion study in March 2019 suggested the possibility of ischemia involving both the anterior and inferior wall.  EF was 45% and there was diffuse hypokinesis.  With his blood pressure elevation, and nuclear study suggestive of ischemia, I added amlodipine 5 mg to his regimen and further titrated metoprolol to 37.5 mg twice a day.  He underwent definitive repeat cardiac catheterization on 03/21/2018 which revealed a widely patent mid LAD stent and a normal left circumflex and dominant RCA.  He subsequently experienced recurrent chest pain symptomatology with plus minus atypical features.  A subsequent nuclear perfusion study was low risk and raised concern for possible mild inferior ischemia.  I suspect this most likely is diaphragmatic attenuation.  At his last catheterization his RCA and circumflex vessels were entirely normal.  He had normal perfusion in the LAD territory and at last cath his LAD stent was widely patent.  He has had issues with hypertension necessitating medication titration also developed complete heart block with marked bradycardia requiring initial temporary pacemaker but heart rhythm stabilized and heart heart block resolved with discontinuance of rate control medication.  Presently, he continues to be on amlodipine 10 mg, isosorbide now at a reduced dose of 60 mg, in addition to losartan HCT 100/12.5 mg as well as ranolazine 1000 mg twice a day for hypertension and his CAD.  He is not having any anginal symptomatology.  His blood pressure today is stable without orthostatic change.  He is undergoing neurologic  evaluation for his dizziness and was given a prescription for meclizine  to take on an as-needed basis.  He will be following up with Dr. April Manson at [facility] neurology.  He apparently wore a 3-day monitor by Dr. Ouida [name] which reportedly did not reveal any problems.  He is diabetic on metformin.  He is finishing up his present therapy switching to DAPT with aspirin/clopidogrel.  If he continues to have episodic dizziness, a loop monitor may be beneficial to allow for long duration monitoring.  He continues to be on atorvastatin 40 mg for hyperlipidemia.  LDL cholesterol in July 14, 2021 was excellent at 34.   tedication Adjustments/Labs and Tests Ordered: Current medicines are reviewed at length with the patient today.  Concerns regarding medicines are outlined above.  Medication changes, Labs and Tests ordered today are listed in the Patient Instructions below. Patient Instructions  Medication Instructions:  STOP: Brilinta once you complete bottle, then start Plavix 75 mg daily  *If you need a refill on your cardiac medications before your next appointment, please call your pharmacy*   Lab Work: None ordered today   Testing/Procedures: None ordered today   Follow-Up: At [facility], you and your health needs are our priority.  As part of our continuing mission to provide you with exceptional heart care, we have created designated Provider Care Teams.  These Care Teams include your primary Cardiologist (physician) and Advanced Practice Providers (APPs -  Physician Assistants and Nurse Practitioners) who all work together to provide you with the care you need, when you need it.  We recommend signing up for the patient portal called "MyChart".  Sign up information is provided on this After Visit Summary.  MyChart is used to connect with patients for Virtual Visits (Telemedicine).  Patients are able to view lab/test results, encounter notes, upcoming appointments, etc.  Non-urgent messages can  be sent to your provider as well.   To learn more about what you can do with MyChart, go to [email or web address].    Your next appointment:   6 month(s)  The format for your next appointment:   In Person  Provider:   Shelva Majestic, MD        Signed, Patrick Majestic, MD  11/26/2021 4:14 PM    [facility] [address] [place], [place], [place]  [address] Phone: [phone]

## 2021-11-18 NOTE — Patient Instructions (Signed)
Medication Instructions:  STOP: Brilinta once you complete bottle, then start Plavix 75 mg daily  *If you need a refill on your cardiac medications before your next appointment, please call your pharmacy*   Lab Work: None ordered today   Testing/Procedures: None ordered today   Follow-Up: At [facility], you and your health needs are our priority.  As part of our continuing mission to provide you with exceptional heart care, we have created designated Provider Care Teams.  These Care Teams include your primary Cardiologist (physician) and Advanced Practice Providers (APPs -  Physician Assistants and Nurse Practitioners) who all work together to provide you with the care you need, when you need it.  We recommend signing up for the patient portal called "MyChart".  Sign up information is provided on this After Visit Summary.  MyChart is used to connect with patients for Virtual Visits (Telemedicine).  Patients are able to view lab/test results, encounter notes, upcoming appointments, etc.  Non-urgent messages can be sent to your provider as well.   To learn more about what you can do with MyChart, go to [email or web address].    Your next appointment:   6 month(s)  The format for your next appointment:   In Person  Provider:   Nicki Guadalajara, MD

## 2021-11-26 ENCOUNTER — Encounter: Payer: Self-pay | Admitting: Cardiovascular Disease

## 2021-12-25 ENCOUNTER — Other Ambulatory Visit: Payer: Self-pay | Admitting: Urology

## 2021-12-25 DIAGNOSIS — R350 Frequency of micturition: Secondary | ICD-10-CM

## 2022-01-02 ENCOUNTER — Encounter: Payer: Self-pay | Admitting: Cardiovascular Disease

## 2022-01-07 ENCOUNTER — Ambulatory Visit: Payer: Medicare Other | Admitting: Neurology

## 2022-01-14 ENCOUNTER — Encounter: Payer: Self-pay | Admitting: Neurology

## 2022-01-14 ENCOUNTER — Ambulatory Visit (INDEPENDENT_AMBULATORY_CARE_PROVIDER_SITE_OTHER): Payer: Medicare Other | Admitting: Neurology

## 2022-01-14 VITALS — BP 126/74 | HR 82 | Ht 72.0 in | Wt 223.5 lb

## 2022-01-14 DIAGNOSIS — R4189 Other symptoms and signs involving cognitive functions and awareness: Secondary | ICD-10-CM

## 2022-01-14 DIAGNOSIS — U099 Post covid-19 condition, unspecified: Secondary | ICD-10-CM

## 2022-01-14 DIAGNOSIS — R41840 Attention and concentration deficit: Secondary | ICD-10-CM | POA: Diagnosis not present

## 2022-01-14 NOTE — Patient Instructions (Signed)
Continue current medications    Follow up in 6 months

## 2022-01-14 NOTE — Progress Notes (Signed)
[facility]  PATIENT: Patrick [name] DOB: 09-Jan-1956  REFERRING CLINICIAN: Lauro RegulusAnderson, Marshall W, MD HISTORY FROM: Patient  REASON FOR VISIT: Recurrent syncope/near syncope follow up   HISTORICAL  CHIEF COMPLAINT:  Chief Complaint  Patient presents with   Follow-up    Room 14 - alone. Continues having intermittent dizzy spells. Meclizine is mildly helpful. Holding atorvastatin to see if the dizziness would improve. Initially, he felt better but now reports a return of symptoms.     INTERVAL HISTORY 01/14/2022:  Patient presented today for follow-up, since last visit in October he has been taking meclizine.  Initially he reported the meclizine helped but later was not helpful, he increase it to 25 mg up to 3 times daily but no relief.  He described the dizziness as difficulty with focusing, difficulty with concentration, sick state that it feels like there is cotton instead of brain matter and there is impairment of cognitive ability, likely brain fog.  Actually he denies any trouble with balance he he is not unsteady.  Denies any falls.  Stated last year in March she was diagnosed with COVID, mild infection.  Denies any recent TBI. He has not followed up with ENT.    HISTORY OF PRESENT ILLNESS:  This is a 66 year old gentleman with past medical history of hypertension, hyperlipidemia, CKD, diabetes, previous heart attack who is presenting with complaint of recurrent syncope and dizziness.  Patient said the first episode started in April 2022 when he has a episode of dizziness, feeling faint and fuzzy feeling in his head.  He went to the ED was observed and was told it was related to dehydration and discharged home.  Patient had another event on June 17 where he fell and hit his head.  On July 25 he had another event, fell hit his head, was taken to the ED via ambulance and in the ED was noted to have a heart rate of 30 bpm.  He was given the diagnosis of heart block and had  a temporary pacemaker.  He was admitted to the cardiology service and his metoprolol was discontinued and patient have a heart monitor placed.  After discontinuation of the metoprolol his heart rate is normalized.  He was told that the bradycardia was secondary to his metoprolol.  Since being discharged from the hospital he has additional episodes of dizziness but no fainting. He describes the dizziness as lightheaded and fuzzy feeling in his head.   He did follow-up with cardiology again and was told that his recurrent dizziness did not sound cardiac in nature given normal vitals and his EKG strip on his watch was normal.  He was recommended to follow-up with neurology and ENT.   He does report hearing loss and tinnitus but said this is chronic.  OTHER MEDICAL CONDITIONS: HTN, Heart attack, DM, HLD   REVIEW OF SYSTEMS: Full 14 system review of systems performed and negative with exception of: as noted   ALLERGIES: No Known Allergies  HOME MEDICATIONS: Outpatient Medications Prior to Visit  Medication Sig Dispense Refill   amLODipine (NORVASC) 10 MG tablet TAKE 1 TABLET(10 MG) BY MOUTH DAILY 90 tablet 3   aspirin (ASPIRIN LOW DOSE) 81 MG EC tablet Take 1 tablet (81 mg total) by mouth daily. 90 tablet 0   atorvastatin (LIPITOR) 40 MG tablet TAKE 1 TABLET(40 MG) BY MOUTH EVERY EVENING 90 tablet 2   clopidogrel (PLAVIX) 75 MG tablet Take 1 tablet (75 mg total) by mouth daily. 90 tablet  3   Coenzyme Q10 200 MG capsule Take 200 mg by mouth daily.     Glucosamine-Chondroit-Vit C-Mn (GLUCOSAMINE 1500 COMPLEX PO) Take 1,500 mg by mouth daily.     isosorbide mononitrate (IMDUR) 60 MG 24 hr tablet Take 60 mg by mouth daily.     losartan-hydrochlorothiazide (HYZAAR) 100-12.5 MG tablet Take 1 tablet by mouth daily.     Magnesium 250 MG TABS Take 250 mg by mouth daily.     meclizine (ANTIVERT) 12.5 MG tablet TAKE 1 TABLET(12.5 MG) BY MOUTH THREE TIMES DAILY AS NEEDED FOR DIZZINESS 30 tablet 0   metFORMIN  (GLUCOPHAGE-XR) 500 MG 24 hr tablet Take 1,000 mg by mouth 2 (two) times daily.     Multiple Vitamins-Minerals (ABC PLUS SENIOR PO) Take 1 tablet by mouth daily.     MYRBETRIQ 25 MG TB24 tablet TAKE 1 TABLET(25 MG) BY MOUTH DAILY 30 tablet 11   nitroGLYCERIN (NITROSTAT) 0.4 MG SL tablet Place 1 tablet (0.4 mg total) under the tongue every 5 (five) minutes x 3 doses as needed for chest pain. 25 tablet 3   nystatin cream (MYCOSTATIN) Apply 1 application topically 2 (two) times daily.     Omega-3 Fatty Acids (FISH OIL OMEGA-3 PO) Take 2 capsules by mouth 2 (two) times daily.      pantoprazole (PROTONIX) 40 MG tablet Take 40 mg by mouth daily.     Potassium 99 MG TABS Take 99 mg by mouth daily.     pyridOXINE (VITAMIN B-6) 50 MG tablet Take 50 mg by mouth daily.     ranolazine (RANEXA) 1000 MG SR tablet TAKE 1 TABLET(1000 MG) BY MOUTH TWICE DAILY 180 tablet 1   tamsulosin (FLOMAX) 0.4 MG CAPS capsule Take 2 capsules (0.8 mg total) by mouth daily. 180 capsule 3   triamcinolone cream (KENALOG) 0.1 % SMARTSIG:1 Application Topical 2-3 Times Daily     vitamin C (ASCORBIC ACID) 500 MG tablet Take 500 mg by mouth daily.     vitamin E 400 UNIT capsule Take 400 Units by mouth daily.     No facility-administered medications prior to visit.    PAST MEDICAL HISTORY: Past Medical History:  Diagnosis Date   CAD in native artery    a. anterolateral STEMI 07/2017 s/p DES to prox-mid LAD, EF 55%.   GERD (gastroesophageal reflux disease)    Hyperlipidemia    Hypertension    Overactive bladder    Renal calculi    Syncope    Uncontrolled diabetes mellitus     PAST SURGICAL HISTORY: Past Surgical History:  Procedure Laterality Date   CORONARY/GRAFT ACUTE MI REVASCULARIZATION N/A 08/04/2017   Procedure: Coronary/Graft Acute MI Revascularization;  Surgeon: Lennette Bihari, MD;  Location: [facility] INVASIVE CV LAB;  Service: Cardiovascular;  Laterality: N/A;   LEFT HEART CATH AND CORONARY ANGIOGRAPHY N/A 08/04/2017    Procedure: LEFT HEART CATH AND CORONARY ANGIOGRAPHY;  Surgeon: Lennette Bihari, MD;  Location: [facility] INVASIVE CV LAB;  Service: Cardiovascular;  Laterality: N/A;   LEFT HEART CATH AND CORONARY ANGIOGRAPHY N/A 03/21/2018   Procedure: LEFT HEART CATH AND CORONARY ANGIOGRAPHY;  Surgeon: Lennette Bihari, MD;  Location: [facility] INVASIVE CV LAB;  Service: Cardiovascular;  Laterality: N/A;   PROSTATE SURGERY     TEMPORARY PACEMAKER N/A 07/14/2021   Procedure: TEMPORARY PACEMAKER;  Surgeon: Marykay Lex, MD;  Location: [facility] INVASIVE CV LAB;  Service: Cardiovascular;  Laterality: N/A;    FAMILY HISTORY: Family History  Problem Relation Age of Onset  Dementia Mother    Heart disease Father    Heart attack Father     SOCIAL HISTORY: Social History   Socioeconomic History   Marital status: Married    Spouse name: Not on file   Number of children: 3   Years of education: GED   Highest education level: Not on file  Occupational History   Occupation: Editor, commissioningVideo producer  Tobacco Use   Smoking status: Former    Types: Cigarettes   Smokeless tobacco: Never   Tobacco comments:    Quit smoking at age 66.  Substance and Sexual Activity   Alcohol use: No   Drug use: No   Sexual activity: Not on file  Other Topics Concern   Not on file  Social History Narrative   Lives at home with his wife.   Right-handed.   Caffeine use: 3.5 cups per day.   Social Determinants of Health   Financial Resource Strain: Not on file  Food Insecurity: Not on file  Transportation Needs: Not on file  Physical Activity: Not on file  Stress: Not on file  Social Connections: Not on file  Intimate Partner Violence: Not on file    PHYSICAL EXAM  GENERAL EXAM/CONSTITUTIONAL: Vitals:  Vitals:   01/14/22 1125  BP: 126/74  Pulse: 82  Weight: 223 lb 8 oz (101.4 kg)  Height: 6' (1.829 m)   Body mass index is 30.31 kg/m. Wt Readings from Last 3 Encounters:  01/14/22 223 lb 8 oz (101.4 kg)  11/18/21 222 lb (100.7  kg)  10/21/21 212 lb (96.2 kg)   Patient is in no distress; well developed, nourished and groomed; neck is supple  CARDIOVASCULAR: Examination of carotid arteries is normal; no carotid bruits Regular rate and rhythm, no murmurs Examination of peripheral vascular system by observation and palpation is normal  EYES: Pupils round and reactive to light, Visual fields full to confrontation, Extraocular movements intacts,   MUSCULOSKELETAL: Gait, strength, tone, movements noted in Neurologic exam below  NEUROLOGIC: MENTAL STATUS:  No flowsheet data found. awake, alert, oriented to person, place and time recent and remote memory intact normal attention and concentration language fluent, comprehension intact, naming intact fund of knowledge appropriate  CRANIAL NERVE:  2nd - no papilledema or hemorrhages on fundoscopic exam 2nd, 3rd, 4th, 6th - pupils equal and reactive to light, visual fields full to confrontation, extraocular muscles intact, no nystagmus 5th - facial sensation symmetric 7th - facial strength symmetric 8th - hearing intact 9th - palate elevates symmetrically, uvula midline 11th - shoulder shrug symmetric 12th - tongue protrusion midline  MOTOR:  normal bulk and tone, full strength in the BUE, BLE  SENSORY:  normal and symmetric to light touch, pinprick, temperature, vibration  COORDINATION:  finger-nose-finger, fine finger movements normal  REFLEXES:  deep tendon reflexes present and symmetric  GAIT/STATION:  normal     DIAGNOSTIC DATA (LABS, IMAGING, TESTING) - I reviewed patient records, labs, notes, testing and imaging myself where available.  Lab Results  Component Value Date   WBC 6.3 09/01/2021   HGB 13.4 09/01/2021   HCT 39.2 09/01/2021   MCV 91 09/01/2021   PLT 228 09/01/2021      Component Value Date/Time   NA 137 09/01/2021 1628   NA 140 05/18/2013 0959   K 4.2 09/01/2021 1628   K 4.0 05/18/2013 0959   CL 99 09/01/2021 1628    CL 104 05/18/2013 0959   CO2 22 09/01/2021 1628   CO2 29 05/18/2013 0959  GLUCOSE 169 (H) 09/01/2021 1628   GLUCOSE 112 (H) 07/16/2021 0157   GLUCOSE 142 (H) 05/18/2013 0959   BUN 23 09/01/2021 1628   BUN 27 (H) 05/18/2013 0959   CREATININE 1.59 (H) 09/01/2021 1628   CREATININE 1.03 05/18/2013 0959   CALCIUM 9.5 09/01/2021 1628   CALCIUM 9.3 05/18/2013 0959   PROT 5.2 (L) 07/14/2021 1159   ALBUMIN 3.0 (L) 07/14/2021 1159   AST 18 07/14/2021 1159   ALT 22 07/14/2021 1159   ALKPHOS 29 (L) 07/14/2021 1159   BILITOT 1.1 07/14/2021 1159   GFRNONAA >60 07/16/2021 0157   GFRNONAA >60 05/18/2013 0959   GFRAA 82 03/16/2018 1155   GFRAA >60 05/18/2013 0959   Lab Results  Component Value Date   CHOL 79 07/14/2021   HDL 27 (L) 07/14/2021   LDLCALC 34 07/14/2021   TRIG 89 07/14/2021   CHOLHDL 2.9 07/14/2021   Lab Results  Component Value Date   HGBA1C 5.9 (H) 07/14/2021   No results found for: VITAMINB12 No results found for: TSH  Head CT 06/06/21 1. No acute intracranial abnormality. 2. Stable age related atrophy and minor chronic small vessel ischemia.   Cervical spine CT 06/06/2021 1. No acute fracture or subluxation of the cervical spine. 2. 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    ASSESSMENT AND PLAN  66 y.o. year old male with past medical history of previous heart attack in 2018, hypertension, hyperlipidemia, CKD who is presenting for follow up for syncope and dizziness.  The initial dizziness resolved, and currently he is having complaints of difficulty with concentration, brain fog, denies any room spinning sensation, denies feeling lightheaded and denies any balance problem.  He reported meclizine is not helpful, he even doubled the dose but no relief.  I have explained to the patient that the initial dizziness that he experienced resolved but his current symptoms that he is describing as dizziness now is not consistent with dizziness/vertigo.  He is likely  describing brain fog, stating difficulty with concentration, feeling like there is cotton instead of brain matter and difficulty with cognition.  He denies any recent TBI but was diagnosed last year in the spring with COVID.  There is a possibility that this is long COVID which we will treat conservatively.  I have advised patient to increase his fluid intake, to increase exercise, to follow-up with his primary care doctor and I will see him in 6 months for follow-up.   1. Brain fog   2. COVID-19 long hauler manifesting chronic concentration deficit     Patient Instructions  Continue current medications  Follow up in 6 months    No orders of the defined types were placed in this encounter.    No orders of the defined types were placed in this encounter.   Return in about 6 months (around 07/14/2022).    Windell Norfolk, MD 01/14/2022, 1:15 PM  [facility] [address] [place], [place] [address] [phone]

## 2022-03-27 ENCOUNTER — Other Ambulatory Visit: Payer: Self-pay | Admitting: Cardiovascular Disease

## 2022-03-27 ENCOUNTER — Other Ambulatory Visit: Payer: Self-pay | Admitting: Internal Medicine

## 2022-04-20 ENCOUNTER — Other Ambulatory Visit: Payer: Self-pay | Admitting: Family Medicine

## 2022-04-20 ENCOUNTER — Other Ambulatory Visit: Payer: Medicare Other

## 2022-04-20 DIAGNOSIS — N138 Other obstructive and reflux uropathy: Secondary | ICD-10-CM

## 2022-04-21 LAB — PSA: Prostate Specific Ag, Serum: 3.8 ng/mL (ref 0.0–4.0)

## 2022-04-22 ENCOUNTER — Other Ambulatory Visit: Payer: Self-pay

## 2022-04-22 ENCOUNTER — Telehealth: Payer: Self-pay

## 2022-04-22 DIAGNOSIS — N138 Other obstructive and reflux uropathy: Secondary | ICD-10-CM

## 2022-04-22 DIAGNOSIS — R972 Elevated prostate specific antigen [PSA]: Secondary | ICD-10-CM

## 2022-04-22 NOTE — Telephone Encounter (Signed)
-----   Message from Harle Battiest, PA-C sent at 04/21/2022  9:53 AM EDT ----- ?Please have Patrick [name] know that his PSA is remaining relatively stable at 3.8.  We still need to keep a closer eye on it and I would like him to return in 6 months for a repeat PSA, IPSS, SH IM and exam. ?

## 2022-04-22 NOTE — Telephone Encounter (Signed)
Notified pt as advised, pt expressed understanding. Pt is requesting for next PSA draw to have all of his labs drawn at once at a different lab. Per Maralyn Sago, this should be ok, just need to mail pt requisition to take with him to lab appt. Advised pt of these instructions and made office visit appt. Pt expressed understanding.  ?

## 2022-05-15 ENCOUNTER — Encounter (HOSPITAL_COMMUNITY): Payer: Self-pay

## 2022-05-15 ENCOUNTER — Other Ambulatory Visit: Payer: Self-pay

## 2022-05-15 ENCOUNTER — Emergency Department (HOSPITAL_COMMUNITY): Payer: Medicare Other

## 2022-05-15 ENCOUNTER — Inpatient Hospital Stay (HOSPITAL_COMMUNITY)
Admission: EM | Admit: 2022-05-15 | Discharge: 2022-05-17 | DRG: 243 | Disposition: A | Discharge status: Home / Self Care | Payer: Medicare Other | Attending: Cardiology | Admitting: Cardiology

## 2022-05-15 DIAGNOSIS — I2510 Atherosclerotic heart disease of native coronary artery without angina pectoris: Secondary | ICD-10-CM | POA: Diagnosis present

## 2022-05-15 DIAGNOSIS — R001 Bradycardia, unspecified: Secondary | ICD-10-CM | POA: Diagnosis present

## 2022-05-15 DIAGNOSIS — I10 Essential (primary) hypertension: Secondary | ICD-10-CM | POA: Diagnosis present

## 2022-05-15 DIAGNOSIS — E785 Hyperlipidemia, unspecified: Secondary | ICD-10-CM | POA: Diagnosis present

## 2022-05-15 DIAGNOSIS — I252 Old myocardial infarction: Secondary | ICD-10-CM | POA: Diagnosis not present

## 2022-05-15 DIAGNOSIS — Z7982 Long term (current) use of aspirin: Secondary | ICD-10-CM

## 2022-05-15 DIAGNOSIS — Z955 Presence of coronary angioplasty implant and graft: Secondary | ICD-10-CM

## 2022-05-15 DIAGNOSIS — I442 Atrioventricular block, complete: Secondary | ICD-10-CM | POA: Diagnosis present

## 2022-05-15 DIAGNOSIS — Z79899 Other long term (current) drug therapy: Secondary | ICD-10-CM

## 2022-05-15 DIAGNOSIS — Z7984 Long term (current) use of oral hypoglycemic drugs: Secondary | ICD-10-CM

## 2022-05-15 DIAGNOSIS — K219 Gastro-esophageal reflux disease without esophagitis: Secondary | ICD-10-CM | POA: Diagnosis present

## 2022-05-15 DIAGNOSIS — Z87891 Personal history of nicotine dependence: Secondary | ICD-10-CM

## 2022-05-15 DIAGNOSIS — E118 Type 2 diabetes mellitus with unspecified complications: Secondary | ICD-10-CM | POA: Diagnosis present

## 2022-05-15 DIAGNOSIS — I4510 Unspecified right bundle-branch block: Secondary | ICD-10-CM | POA: Diagnosis present

## 2022-05-15 DIAGNOSIS — N179 Acute kidney failure, unspecified: Secondary | ICD-10-CM

## 2022-05-15 DIAGNOSIS — E119 Type 2 diabetes mellitus without complications: Secondary | ICD-10-CM | POA: Diagnosis present

## 2022-05-15 DIAGNOSIS — I25118 Atherosclerotic heart disease of native coronary artery with other forms of angina pectoris: Secondary | ICD-10-CM | POA: Diagnosis present

## 2022-05-15 DIAGNOSIS — Z950 Presence of cardiac pacemaker: Secondary | ICD-10-CM | POA: Diagnosis not present

## 2022-05-15 DIAGNOSIS — Z7902 Long term (current) use of antithrombotics/antiplatelets: Secondary | ICD-10-CM

## 2022-05-15 LAB — COMPREHENSIVE METABOLIC PANEL
ALT: 40 U/L (ref 0–44)
AST: 40 U/L (ref 15–41)
Albumin: 3.9 g/dL (ref 3.5–5.0)
Alkaline Phosphatase: 35 U/L — ABNORMAL LOW (ref 38–126)
Anion gap: 11 (ref 5–15)
BUN: 27 mg/dL — ABNORMAL HIGH (ref 8–23)
CO2: 22 mmol/L (ref 22–32)
Calcium: 8.7 mg/dL — ABNORMAL LOW (ref 8.9–10.3)
Chloride: 104 mmol/L (ref 98–111)
Creatinine, Ser: 1.73 mg/dL — ABNORMAL HIGH (ref 0.61–1.24)
GFR, Estimated: 42 mL/min — ABNORMAL LOW (ref >60)
Glucose, Bld: 152 mg/dL — ABNORMAL HIGH (ref 70–99)
Potassium: 4.7 mmol/L (ref 3.5–5.1)
Sodium: 137 mmol/L (ref 135–145)
Total Bilirubin: 1 mg/dL (ref 0.3–1.2)
Total Protein: 6.3 g/dL — ABNORMAL LOW (ref 6.5–8.1)

## 2022-05-15 LAB — CBC
HCT: 39.1 % (ref 39.0–52.0)
Hemoglobin: 13.9 g/dL (ref 13.0–17.0)
MCH: 32 pg (ref 26.0–34.0)
MCHC: 35.5 g/dL (ref 30.0–36.0)
MCV: 90.1 fL (ref 80.0–100.0)
Platelets: 181 10*3/uL (ref 150–400)
RBC: 4.34 MIL/uL (ref 4.22–5.81)
RDW: 12.2 % (ref 11.5–15.5)
WBC: 7.5 10*3/uL (ref 4.0–10.5)
nRBC: 0 % (ref 0.0–0.2)

## 2022-05-15 LAB — TROPONIN I (HIGH SENSITIVITY)
Troponin I (High Sensitivity): 4 ng/L (ref <18)
Troponin I (High Sensitivity): 4 ng/L (ref <18)

## 2022-05-15 LAB — TSH: TSH: 0.732 u[IU]/mL (ref 0.350–4.500)

## 2022-05-15 IMAGING — DX DG CHEST 1V PORT
1 series · 1 of 1 positions shown · non-contrast
Comparison: [DATE].

CLINICAL DATA: Bradycardia.

EXAM:
PORTABLE CHEST 1 VIEW

[chest ap]
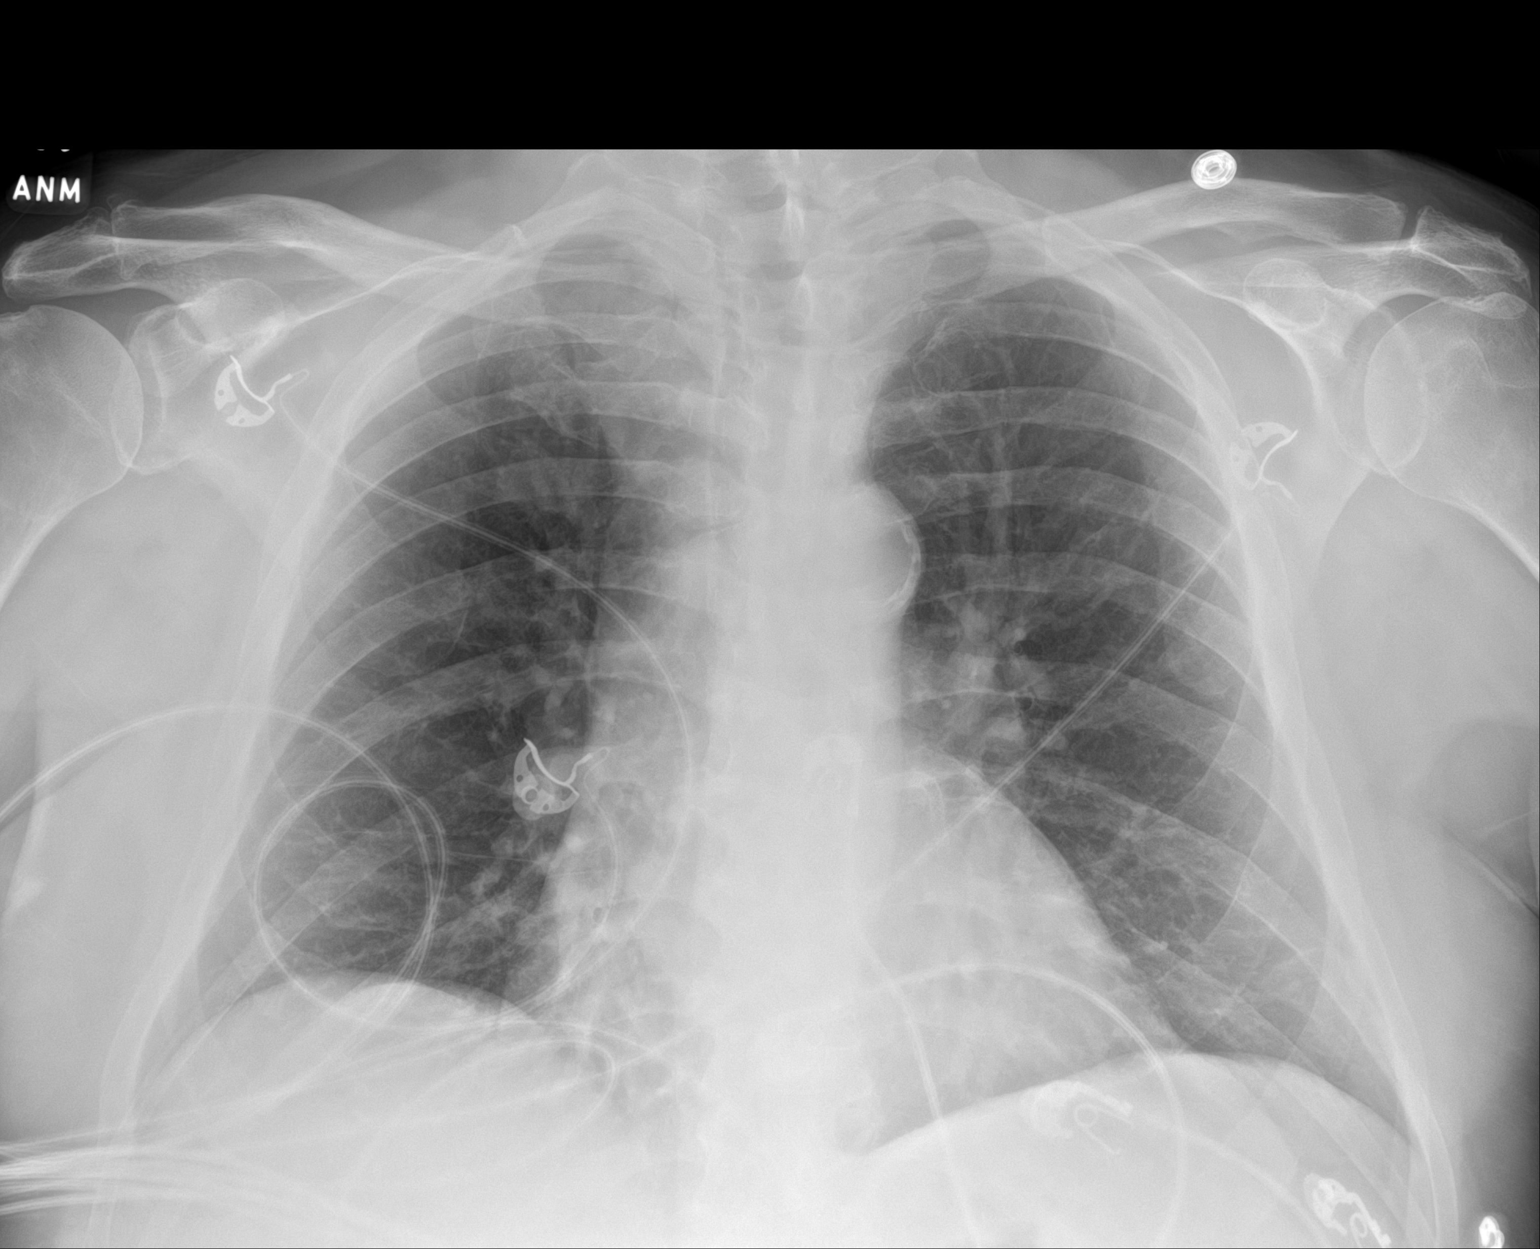

[1 of 1 positions shown; findings below may reference images not displayed]

FINDINGS: Stable cardiomediastinal silhouette. Both lungs are clear. The
visualized skeletal structures are unremarkable.
IMPRESSION: No active disease.

Aortic Atherosclerosis ([IU]-[IU]).

## 2022-05-15 MED ORDER — INSULIN ASPART 100 UNIT/ML IJ SOLN
0.0000 [IU] | Freq: Three times a day (TID) | INTRAMUSCULAR | Status: DC
  Administered 2022-05-16 – 2022-05-17 (×4): 1 [IU] via SUBCUTANEOUS

## 2022-05-15 MED ORDER — TAMSULOSIN HCL 0.4 MG PO CAPS
0.8000 mg | ORAL_CAPSULE | Freq: Every evening | ORAL | Status: DC
  Administered 2022-05-15 – 2022-05-17 (×3): 0.8 mg via ORAL
  Filled 2022-05-15 (×4): qty 2

## 2022-05-15 MED ORDER — ATROPINE SULFATE 1 MG/10ML IJ SOSY
1.0000 mg | PREFILLED_SYRINGE | Freq: Once | INTRAMUSCULAR | Status: AC
  Administered 2022-05-15: 1 mg via INTRAVENOUS
  Filled 2022-05-15: qty 10

## 2022-05-15 MED ORDER — ACETAMINOPHEN 325 MG PO TABS
650.0000 mg | ORAL_TABLET | ORAL | Status: DC | PRN
  Administered 2022-05-17: 650 mg via ORAL
  Filled 2022-05-15: qty 2

## 2022-05-15 MED ORDER — PANTOPRAZOLE SODIUM 40 MG PO TBEC
40.0000 mg | DELAYED_RELEASE_TABLET | Freq: Every day | ORAL | Status: DC
  Administered 2022-05-16 – 2022-05-17 (×2): 40 mg via ORAL
  Filled 2022-05-15 (×2): qty 1

## 2022-05-15 MED ORDER — ONDANSETRON HCL 4 MG/2ML IJ SOLN: 4.0000 mg | Freq: Four times a day (QID) | INTRAMUSCULAR | Status: DC | PRN

## 2022-05-15 MED ORDER — ROSUVASTATIN CALCIUM 20 MG PO TABS
20.0000 mg | ORAL_TABLET | Freq: Every day | ORAL | Status: DC
Start: 2022-05-16 — End: 2022-05-18
  Administered 2022-05-16 – 2022-05-17 (×2): 20 mg via ORAL
  Filled 2022-05-15 (×2): qty 1

## 2022-05-15 NOTE — ED Notes (Signed)
Pacer pads applied to patient at this time.

## 2022-05-15 NOTE — ED Notes (Signed)
Ordered atropine administered at this time. Patient remains alert and oriented. VSS stable at this time. NIBP, cardiac monitor and pulse ox remain in place.

## 2022-05-15 NOTE — ED Notes (Signed)
ED Provider at bedside. 

## 2022-05-15 NOTE — H&P (Signed)
Cardiology Admission History and Physical:   Patient ID: Patrick [name] MRN: [id number]; DOB: 10/15/1956   Admission date: 05/15/2022  PCP:  Patrick RegulusAnderson, Marshall W, MD   [facility] Providers Cardiologist:  Nicki Guadalajarahomas Kelly, MD        Chief Complaint: Symptomatic bradycardia  Patient Profile:   Patrick [name] is a 66 y.o. 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  History of Present Illness:   Patrick [name] is a 66 year old male with known CAD prior anterolateral STEMI in 2018 with DES to the proximal mid LAD, normal ejection fraction with hypertension hyperlipidemia and prior syncope who in July 2022 had a temporary pacemaker wire placed because of high degree AV block.  Dr. Graciela HusbandsKlein was able to remove the temporary pacemaker wire after washout of metoprolol and he has been doing well over the past year until earlier today noted that his heart rate was in the 30s for a prolonged period of time.  His android watch noted this.  He felt poor, dizzy.  He had to sit down a few times.  Felt unsettled.  No chest pain.  No shortness of breath.  He was administered atropine here in the emergency department without any changes.Currently his most recent heart rate was 85 bpm.  He is here with his wife Arline AspCindy.  He works in Radio broadcast assistantaudiovisual.  He has done some work for [employer].   Past Medical History:  Diagnosis Date   CAD in native artery    a. anterolateral STEMI 07/2017 s/p DES to prox-mid LAD, EF 55%.   GERD (gastroesophageal reflux disease)    Hyperlipidemia    Hypertension    Overactive bladder    Renal calculi    Syncope    Uncontrolled diabetes mellitus     Past Surgical History:  Procedure Laterality Date   CORONARY/GRAFT ACUTE MI REVASCULARIZATION N/A 08/04/2017   Procedure: Coronary/Graft Acute MI Revascularization;  Surgeon: Lennette BihariKelly, Thomas A, MD;  Location: [facility];  Service: Cardiovascular;  Laterality: N/A;   LEFT HEART CATH AND  CORONARY ANGIOGRAPHY N/A 08/04/2017   Procedure: LEFT HEART CATH AND CORONARY ANGIOGRAPHY;  Surgeon: Lennette BihariKelly, Thomas A, MD;  Location: [facility];  Service: Cardiovascular;  Laterality: N/A;   LEFT HEART CATH AND CORONARY ANGIOGRAPHY N/A 03/21/2018   Procedure: LEFT HEART CATH AND CORONARY ANGIOGRAPHY;  Surgeon: Lennette BihariKelly, Thomas A, MD;  Location: [facility];  Service: Cardiovascular;  Laterality: N/A;   PROSTATE SURGERY     TEMPORARY PACEMAKER N/A 07/14/2021   Procedure: TEMPORARY PACEMAKER;  Surgeon: Marykay LexHarding, David W, MD;  [facility];  Service: Cardiovascular;  Laterality: N/A;     Medications Prior to Admission: Prior to Admission medications   Medication Sig Start Date End Date Taking? Authorizing Provider  amLODipine (NORVASC) 10 MG tablet TAKE 1 TABLET(10 MG) BY MOUTH DAILY 03/27/22   Hilty, Lisette AbuKenneth C, MD  aspirin (ASPIRIN LOW DOSE) 81 MG EC tablet Take 1 tablet (81 mg total) by mouth daily. 11/01/18   Lennette BihariKelly, Thomas A, MD  atorvastatin (LIPITOR) 40 MG tablet TAKE 1 TABLET(40 MG) BY MOUTH EVERY EVENING 01/03/19   Lennette BihariKelly, Thomas A, MD  clopidogrel (PLAVIX) 75 MG tablet Take 1 tablet (75 mg total) by mouth daily. 07/25/21   Lennette BihariKelly, Thomas A, MD  Coenzyme Q10 200 MG capsule Take 200 mg by mouth daily.    [provider]  Glucosamine-Chondroit-Vit C-Mn (GLUCOSAMINE 1500 COMPLEX PO) Take  1,500 mg by mouth daily.    [provider]  isosorbide mononitrate (IMDUR) 60 MG 24 hr tablet Take 60 mg by mouth daily.    [provider]  losartan-hydrochlorothiazide (HYZAAR) 100-12.5 MG tablet Take 1 tablet by mouth daily. 07/23/21 07/23/22  [provider]  Magnesium 250 MG TABS Take 250 mg by mouth daily.    [provider]  meclizine (ANTIVERT) 12.5 MG tablet TAKE 1 TABLET(12.5 MG) BY MOUTH THREE TIMES DAILY AS NEEDED FOR DIZZINESS 10/20/21   Windell Norfolk, MD  metFORMIN (GLUCOPHAGE-XR) 500 MG 24 hr tablet Take 1,000 mg by mouth 2 (two) times  daily. 02/25/21   [provider]  Multiple Vitamins-Minerals (ABC PLUS SENIOR PO) Take 1 tablet by mouth daily.    [provider]  MYRBETRIQ 25 MG TB24 tablet TAKE 1 TABLET(25 MG) BY MOUTH DAILY 12/25/21   McGowan, Carollee Herter A, PA-C  nitroGLYCERIN (NITROSTAT) 0.4 MG SL tablet Place 1 tablet (0.4 mg total) under the tongue every 5 (five) minutes x 3 doses as needed for chest pain. 07/25/21   Lennette Bihari, MD  nystatin cream (MYCOSTATIN) Apply 1 application topically 2 (two) times daily. 11/12/21   [provider]  Omega-3 Fatty Acids (FISH OIL OMEGA-3 PO) Take 2 capsules by mouth 2 (two) times daily.     [provider]  pantoprazole (PROTONIX) 40 MG tablet Take 40 mg by mouth daily.    [provider]  Potassium 99 MG TABS Take 99 mg by mouth daily.    [provider]  pyridOXINE (VITAMIN B-6) 50 MG tablet Take 50 mg by mouth daily.    [provider]  ranolazine (RANEXA) 1000 MG SR tablet TAKE 1 TABLET(1000 MG) BY MOUTH TWICE DAILY 03/27/22   Lennette Bihari, MD  tamsulosin (FLOMAX) 0.4 MG CAPS capsule Take 2 capsules (0.8 mg total) by mouth daily. 10/21/21   Michiel Cowboy A, PA-C  triamcinolone cream (KENALOG) 0.1 % SMARTSIG:1 Application Topical 2-3 Times Daily 11/12/21   [provider]  vitamin C (ASCORBIC ACID) 500 MG tablet Take 500 mg by mouth daily.    [provider]  vitamin E 400 UNIT capsule Take 400 Units by mouth daily.    [provider]     Allergies:   No Known Allergies  Social History:   Social History   Socioeconomic History   Marital status: Married    Spouse name: Not on file   Number of children: 3   Years of education: GED   Highest education level: Not on file  Occupational History   Occupation: Editor, commissioning  Tobacco Use   Smoking status: Former    Types: Cigarettes   Smokeless tobacco: Never   Tobacco comments:    Quit smoking at age 1.  Substance and Sexual Activity    Alcohol use: No   Drug use: No   Sexual activity: Not on file  Other Topics Concern   Not on file  Social History Narrative   Lives at home with his wife.   Right-handed.   Caffeine use: 3.5 cups per day.   Social Determinants of Health   Financial Resource Strain: Not on file  Food Insecurity: Not on file  Transportation Needs: Not on file  Physical Activity: Not on file  Stress: Not on file  Social Connections: Not on file  Intimate Partner Violence: Not on file    Family History:   The patient's family history includes Dementia in his mother;  Heart attack in his father; Heart disease in his father.    ROS:  Please see the history of present illness.  No fevers chills nausea vomiting syncope bleeding all other ROS reviewed and negative.     Physical Exam/Data:   Vitals:   05/15/22 1627 05/15/22 1630 05/15/22 1645 05/15/22 1700  BP:  134/63 129/63 133/79  Pulse: (!) 54 (!) 52 (!) 51 65  Resp: (!) (!) 22  Temp:      TempSrc:      SpO2: 93% 94% 92% 91%   No intake or output data in the 24 hours ending 05/15/22 1724    01/14/2022   11:25 AM 11/18/2021    4:03 PM 10/21/2021    2:54 PM  Last 3 Weights  Weight (lbs) 223 lb 8 oz 222 lb 212 lb  Weight (kg) 101.379 kg 100.699 kg 96.163 kg     There is no height or weight on file to calculate BMI.  General:  Well nourished, well developed, in no acute distress HEENT: normal Neck: no JVD Vascular: No carotid bruits; Distal pulses 2+ bilaterally   Cardiac:  normal S1, S2; RRR; no murmur  Lungs:  clear to auscultation bilaterally, no wheezing, rhonchi or rales  Abd: soft, nontender, no hepatomegaly  Ext: no edema Musculoskeletal:  No deformities, BUE and BLE strength normal and equal Skin: warm and dry  Neuro:  CNs 2-12 intact, no focal abnormalities noted Psych:  Normal affect    EKG:  The ECG that was done high degree AV block, 3-1 conduction was personally reviewed  Relevant CV  Studies: Echocardiogram July 2022 shows EF of 65%. Cardiac catheterization 2018-proximal to mid LAD stent  Laboratory Data:  High Sensitivity Troponin:   Recent Labs  Lab 05/15/22 1601  TROPONINIHS 4      Chemistry Recent Labs  Lab 05/15/22 1601  NA 137  K 4.7  CL 104  CO2 22  GLUCOSE 152*  BUN 27*  CREATININE 1.73*  CALCIUM 8.7*  GFRNONAA 42*  ANIONGAP 11    Recent Labs  Lab 05/15/22 1601  PROT 6.3*  ALBUMIN 3.9  AST 40  ALT 40  ALKPHOS 35*  BILITOT 1.0   Lipids No results for input(s): CHOL, TRIG, HDL, LABVLDL, LDLCALC, CHOLHDL in the last 168 hours. Hematology Recent Labs  Lab 05/15/22 1601  WBC 7.5  RBC 4.34  HGB 13.9  HCT 39.1  MCV 90.1  MCH 32.0  MCHC 35.5  RDW 12.2  PLT 181   Thyroid No results for input(s): TSH, FREET4 in the last 168 hours. BNPNo results for input(s): BNP, PROBNP in the last 168 hours.  DDimer No results for input(s): DDIMER in the last 168 hours.   Radiology/Studies:  DG Chest Port 1 View  Result Date: 05/15/2022 CLINICAL DATA:  Bradycardia. EXAM: PORTABLE CHEST 1 VIEW COMPARISON:  July 15, 2021. FINDINGS: Stable cardiomediastinal silhouette. Both lungs are clear. The visualized skeletal structures are unremarkable. IMPRESSION: No active disease. Aortic Atherosclerosis (ICD10-I70.0). Electronically Signed   By: Lupita Raider M.D.   On: 05/15/2022 16:19     Assessment and Plan:   Symptomatic bradycardia 2/3-1 AV block.  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 -I spoke to electrophysiologist, Dr. Ladona Ridgel.  We discussed pacemaker placement.  Given his significant symptomatic bradycardia, he should likely stay until pacemaker is  placed. -We will also stop amlodipine although this should not cause any AV nodal delay.  Acute kidney injury - Creatinine is elevated 1.73.  Possibly from hypoperfusion given  low cardiac output in the setting of bradycardia.  We will hold his antihypertensives.  We will place on bedrest.  We will give him breakfast tomorrow and then make him n.p.o.  This will be just in case potential pacemaker can be placed tomorrow afternoon.  Appreciate Dr. Lubertha Basque assistance.  Risk Assessment/Risk Scores:    Severity of Illness: The appropriate patient status for this patient is OBSERVATION. Observation status is judged to be reasonable and necessary in order to provide the required intensity of service to ensure the patient's safety. The patient's presenting symptoms, physical exam findings, and initial radiographic and laboratory data in the context of their medical condition is felt to place them at decreased risk for further clinical deterioration. Furthermore, it is anticipated that the patient will be medically stable for discharge from the hospital within 2 midnights of admission.    For questions or updates, please contact [facility] Please consult [email or web address] for contact info under     Signed, Donato Schultz, MD  05/15/2022 5:24 PM

## 2022-05-15 NOTE — ED Provider Notes (Signed)
[facility] EMERGENCY DEPARTMENT Provider Note   CSN: [id number] Arrival date & time: 05/15/22  1542     History  Chief Complaint  Patient presents with   Bradycardia    Patient arrives from work via EMS. Patient reports feeling dizzy and noticed his HR was in the 40s. Per EMS patient's HR was 34 when they arrived. BP 130/80. Patient denies chest pain and SOB.     Patrick [name] is a 66 y.o. male.  Pt is a 66 yo male with a pmhx significant for CAD, GERD, HTN, DM, hyperlipidemia, and syncope.  Last year he had a syncopal episode and HRs in the 20s.  EKG showed a complete heart block, so he was put on a temporary pacer and his metoprolol was stopped.  HR responded to the metoprolol washout and the temporary pacer was removed.  Pt has been doing well since then as far as his HR goes until today.  Pt was at work and felt very dizzy.  He has a smart watch which showed his HR dropping in the the 30s.  Pt said it continued for an hour, so he called EMS.  Initial EKG does show a SB rhythm at 34.  He was not given anything by EMS, but HR went up to the upper 40s on its own.  BP has been normal.  Pt does feel dizzy, but not as bad as earlier today.        Home Medications Prior to Admission medications   Medication Sig Start Date End Date Taking? Authorizing Provider  amLODipine (NORVASC) 10 MG tablet TAKE 1 TABLET(10 MG) BY MOUTH DAILY 03/27/22   Hilty, Lisette Abu, MD  aspirin (ASPIRIN LOW DOSE) 81 MG EC tablet Take 1 tablet (81 mg total) by mouth daily. 11/01/18   Lennette Bihari, MD  atorvastatin (LIPITOR) 40 MG tablet TAKE 1 TABLET(40 MG) BY MOUTH EVERY EVENING 01/03/19   Lennette Bihari, MD  clopidogrel (PLAVIX) 75 MG tablet Take 1 tablet (75 mg total) by mouth daily. 07/25/21   Lennette Bihari, MD  Coenzyme Q10 200 MG capsule Take 200 mg by mouth daily.    [provider]  Glucosamine-Chondroit-Vit C-Mn (GLUCOSAMINE 1500 COMPLEX PO) Take 1,500 mg by mouth daily.     [provider]  isosorbide mononitrate (IMDUR) 60 MG 24 hr tablet Take 60 mg by mouth daily.    [provider]  losartan-hydrochlorothiazide (HYZAAR) 100-12.5 MG tablet Take 1 tablet by mouth daily. 07/23/21 07/23/22  [provider]  Magnesium 250 MG TABS Take 250 mg by mouth daily.    [provider]  meclizine (ANTIVERT) 12.5 MG tablet TAKE 1 TABLET(12.5 MG) BY MOUTH THREE TIMES DAILY AS NEEDED FOR DIZZINESS 10/20/21   Windell Norfolk, MD  metFORMIN (GLUCOPHAGE-XR) 500 MG 24 hr tablet Take 1,000 mg by mouth 2 (two) times daily. 02/25/21   [provider]  Multiple Vitamins-Minerals (ABC PLUS SENIOR PO) Take 1 tablet by mouth daily.    [provider]  MYRBETRIQ 25 MG TB24 tablet TAKE 1 TABLET(25 MG) BY MOUTH DAILY 12/25/21   McGowan, Carollee Herter A, PA-C  nitroGLYCERIN (NITROSTAT) 0.4 MG SL tablet Place 1 tablet (0.4 mg total) under the tongue every 5 (five) minutes x 3 doses as needed for chest pain. 07/25/21   Lennette Bihari, MD  nystatin cream (MYCOSTATIN) Apply 1 application topically 2 (two) times daily. 11/12/21   [provider]  Omega-3 Fatty Acids (FISH OIL OMEGA-3  PO) Take 2 capsules by mouth 2 (two) times daily.     [provider]  pantoprazole (PROTONIX) 40 MG tablet Take 40 mg by mouth daily.    [provider]  Potassium 99 MG TABS Take 99 mg by mouth daily.    [provider]  pyridOXINE (VITAMIN B-6) 50 MG tablet Take 50 mg by mouth daily.    [provider]  ranolazine (RANEXA) 1000 MG SR tablet TAKE 1 TABLET(1000 MG) BY MOUTH TWICE DAILY 03/27/22   Lennette BihariKelly, Thomas A, MD  tamsulosin (FLOMAX) 0.4 MG CAPS capsule Take 2 capsules (0.8 mg total) by mouth daily. 10/21/21   Michiel CowboyMcGowan, Shannon A, PA-C  triamcinolone cream (KENALOG) 0.1 % SMARTSIG:1 Application Topical 2-3 Times Daily 11/12/21   [provider]  vitamin C (ASCORBIC ACID) 500 MG tablet Take 500 mg by mouth daily.    [provider]  vitamin E 400 UNIT capsule Take 400 Units by mouth daily.    [provider]      Allergies    Patient has no known allergies.    Review of Systems   Review of Systems  Neurological:  Positive for dizziness.  All other systems reviewed and are negative.  Physical Exam Updated Vital Signs BP 135/77   Pulse 91   Temp 98.2 F (36.8 C) (Oral)   Resp (!) 21   SpO2 93%  Physical Exam Vitals and nursing note reviewed.  Constitutional:      Appearance: Normal appearance.  HENT:     Head: Normocephalic and atraumatic.     Right Ear: External ear normal.     Left Ear: External ear normal.     Nose: Nose normal.     Mouth/Throat:     Mouth: Mucous membranes are moist.     Pharynx: Oropharynx is clear.  Eyes:     Extraocular Movements: Extraocular movements intact.     Conjunctiva/sclera: Conjunctivae normal.     Pupils: Pupils are equal, round, and reactive to light.  Cardiovascular:     Rate and Rhythm: Regular rhythm. Bradycardia present.     Pulses: Normal pulses.     Heart sounds: Normal heart sounds.  Pulmonary:     Effort: Pulmonary effort is normal.     Breath sounds: Normal breath sounds.  Abdominal:     General: Abdomen is flat. Bowel sounds are normal.     Palpations: Abdomen is soft.  Musculoskeletal:        General: Normal range of motion.     Cervical back: Normal range of motion and neck supple.  Skin:    General: Skin is warm.     Capillary Refill: Capillary refill takes less than 2 seconds.  Neurological:     General: No focal deficit present.     Mental Status: He is alert and oriented to person, place, and time.  Psychiatric:        Mood and Affect: Mood normal.        Behavior: Behavior normal.    ED Results / Procedures / Treatments   Labs (all labs ordered are listed, but only abnormal results are displayed) Labs Reviewed  COMPREHENSIVE METABOLIC PANEL - Abnormal; Notable for the following components:      Result  Value   Glucose, Bld 152 (*)    BUN 27 (*)    Creatinine, Ser 1.73 (*)    Calcium 8.7 (*)    Total Protein 6.3 (*)    Alkaline Phosphatase 35 (*)  GFR, Estimated 42 (*)    All other components within normal limits  CBC  HEMOGLOBIN A1C  TROPONIN I (HIGH SENSITIVITY)  TROPONIN I (HIGH SENSITIVITY)    EKG EKG Interpretation  Date/Time:  Friday May 15 2022 15:43:26 EDT Ventricular Rate:  51 PR Interval:  191 QRS Duration: 162 QT Interval:  559 QTC Calculation: 515 R Axis:   85 Text Interpretation: Sinus rhythm Right bundle branch block no longer in CHB, but bradycardic Confirmed by Jacalyn Lefevre [phone]) on 05/15/2022 3:56:46 PM  EMS EKG:    Radiology DG Chest Port 1 View  Result Date: 05/15/2022 CLINICAL DATA:  Bradycardia. EXAM: PORTABLE CHEST 1 VIEW COMPARISON:  July 15, 2021. FINDINGS: Stable cardiomediastinal silhouette. Both lungs are clear. The visualized skeletal structures are unremarkable. IMPRESSION: No active disease. Aortic Atherosclerosis (ICD10-I70.0). Electronically Signed   By: Lupita Raider M.D.   On: 05/15/2022 16:19    Procedures Procedures    Medications Ordered in ED Medications  insulin aspart (novoLOG) injection 0-9 Units (has no administration in time range)  atropine 1 MG/10ML injection 1 mg (1 mg Intravenous Given 05/15/22 1618)    ED Course/ Medical Decision Making/ A&P                           Medical Decision Making Amount and/or Complexity of Data Reviewed Labs: ordered. Radiology: ordered.  Risk Prescription drug management. Decision regarding hospitalization.   This patient presents to the ED for concern of dizziness + bradycardia, this involves an extensive number of treatment options, and is a complaint that carries with it a high risk of complications and morbidity.  The differential diagnosis includes heart block, electrolyte abn, MI   Co morbidities that complicate the patient evaluation   CAD, GERD, HTN, DM,  hyperlipidemia, and syncope   Additional history obtained:  Additional history obtained from Epic chart review External records from outside source obtained and reviewed including EMS report   Lab Tests:  I Ordered, and personally interpreted labs.  The pertinent results include:  cbc nl   Imaging Studies ordered:  I ordered imaging studies including cxr  I independently visualized and interpreted imaging which showed    IMPRESSION:  No active disease.     Aortic Atherosclerosis (ICD10-I70.0).   I agree with the radiologist interpretation   Cardiac Monitoring:  The patient was maintained on a cardiac monitor.  I personally viewed and interpreted the cardiac monitored which showed an underlying rhythm of: sb   Medicines ordered and prescription drug management:  I ordered medication including atropine  for bradycardia  Reevaluation of the patient after these medicines showed that the patient stayed the same I have reviewed the patients home medicines and have made adjustments as needed   Test Considered:  trop   Critical Interventions:  cards   Consultations Obtained:  I requested consultation with the cardiology (Dr. Anne Fu),  and discussed lab and imaging findings as well as pertinent plan -he will admit.   Problem List / ED Course:  Symptomatic bradycardia:  pt d/w Dr. Anne Fu who will admit.  ? Pacemaker in future.   Reevaluation:  After the interventions noted above, I reevaluated the patient and found that they have :improved   Social Determinants of Health:  Lives at home with his wife   Dispostion:  After consideration of the diagnostic results and the patients response to treatment, I feel that the patent would benefit from admission.  Final Clinical Impression(s) / ED Diagnoses Final diagnoses:  Symptomatic bradycardia    Rx / DC Orders ED Discharge Orders     None         Jacalyn Lefevre, MD 05/15/22  1753

## 2022-05-15 NOTE — Progress Notes (Addendum)
Dr. Anne Fu reached out for assistance placing admission orders - per his recommendation, will admit to progressive level of care, order Zoll at bedside and pacer pads on patient as precaution, bedrest with bathroom privileges, plan echo, TSH, and EP consult in AM. Dr. Anne Fu discussed preliminarily with Dr. Ladona Ridgel with EP whom he states said the patient could eat and have breakfast in AM as well but NPO after that for possible pacemaker in the afternoon. Msg sent to [employer]master inbox to notify EP of consult tomorrow and also put on EP rounds per Dr. Lubertha Basque request. Will also order SCDs in lieu of pharmacologic DVT ppx in case more urgent intervention is prompted -> this should be reviewed in rounds tomorrow after EP plan is known.   His medications have not been reconciled yet at this time. I have placed care order requesting ER ask pharmacy to do med rec and page cardiology on call when this is available - have signed out to Laverda Page on call to review upon completion. Per Dr. Anne Fu, tentatively anticipate holding amlodipine, losartan-HCT and Plavix. SSI added with CBG checks as well.

## 2022-05-16 ENCOUNTER — Inpatient Hospital Stay (HOSPITAL_COMMUNITY): Payer: Medicare Other

## 2022-05-16 DIAGNOSIS — R001 Bradycardia, unspecified: Secondary | ICD-10-CM | POA: Diagnosis not present

## 2022-05-16 DIAGNOSIS — I442 Atrioventricular block, complete: Secondary | ICD-10-CM | POA: Diagnosis not present

## 2022-05-16 LAB — ECHOCARDIOGRAM COMPLETE
AR max vel: 2.44 cm2
AV Area VTI: 2.44 cm2
AV Area mean vel: 2.38 cm2
AV Mean grad: 3 mmHg
AV Peak grad: 5.5 mmHg
Ao pk vel: 1.17 m/s
Area-P 1/2: 3.1 cm2
Calc EF: 59 %
Height: 72 in
MV VTI: 3.14 cm2
S' Lateral: 3.1 cm
Single Plane A2C EF: 54.8 %
Single Plane A4C EF: 61.7 %
Weight: 3518.4 oz

## 2022-05-16 LAB — CBC
HCT: 35.5 % — ABNORMAL LOW (ref 39.0–52.0)
Hemoglobin: 12.9 g/dL — ABNORMAL LOW (ref 13.0–17.0)
MCH: 32.2 pg (ref 26.0–34.0)
MCHC: 36.3 g/dL — ABNORMAL HIGH (ref 30.0–36.0)
MCV: 88.5 fL (ref 80.0–100.0)
Platelets: 169 10*3/uL (ref 150–400)
RBC: 4.01 MIL/uL — ABNORMAL LOW (ref 4.22–5.81)
RDW: 12.3 % (ref 11.5–15.5)
WBC: 6.3 10*3/uL (ref 4.0–10.5)
nRBC: 0 % (ref 0.0–0.2)

## 2022-05-16 LAB — BASIC METABOLIC PANEL
Anion gap: 6 (ref 5–15)
BUN: 20 mg/dL (ref 8–23)
CO2: 26 mmol/L (ref 22–32)
Calcium: 8.8 mg/dL — ABNORMAL LOW (ref 8.9–10.3)
Chloride: 105 mmol/L (ref 98–111)
Creatinine, Ser: 1.3 mg/dL — ABNORMAL HIGH (ref 0.61–1.24)
GFR, Estimated: 60 mL/min — ABNORMAL LOW (ref >60)
Glucose, Bld: 104 mg/dL — ABNORMAL HIGH (ref 70–99)
Potassium: 3.6 mmol/L (ref 3.5–5.1)
Sodium: 137 mmol/L (ref 135–145)

## 2022-05-16 LAB — HIV ANTIBODY (ROUTINE TESTING W REFLEX): HIV Screen 4th Generation wRfx: NONREACTIVE

## 2022-05-16 LAB — GLUCOSE, CAPILLARY
Glucose-Capillary: 132 mg/dL — ABNORMAL HIGH (ref 70–99)
Glucose-Capillary: 134 mg/dL — ABNORMAL HIGH (ref 70–99)
Glucose-Capillary: 135 mg/dL — ABNORMAL HIGH (ref 70–99)
Glucose-Capillary: 246 mg/dL — ABNORMAL HIGH (ref 70–99)
Glucose-Capillary: 99 mg/dL (ref 70–99)

## 2022-05-16 LAB — SURGICAL PCR SCREEN
MRSA, PCR: NEGATIVE
Staphylococcus aureus: NEGATIVE

## 2022-05-16 MED ORDER — SODIUM CHLORIDE 0.9 % IV SOLN
80.0000 mg | INTRAVENOUS | Status: AC
  Administered 2022-05-17: 80 mg
  Filled 2022-05-16: qty 2

## 2022-05-16 MED ORDER — CEFAZOLIN SODIUM-DEXTROSE 2-4 GM/100ML-% IV SOLN
2.0000 g | INTRAVENOUS | Status: AC
  Administered 2022-05-17: 2 g via INTRAVENOUS

## 2022-05-16 MED ORDER — SODIUM CHLORIDE 0.9 % IV SOLN: INTRAVENOUS | Status: DC

## 2022-05-16 NOTE — Plan of Care (Signed)

## 2022-05-16 NOTE — Progress Notes (Signed)
*  PRELIMINARY RESULTS* Echocardiogram 2D Echocardiogram has been performed.  [name] 05/16/2022, 1:31 PM

## 2022-05-16 NOTE — Plan of Care (Signed)
  Problem: Education: Goal: Knowledge of disease or condition will improve 05/16/2022 1029 by Drucie Ip I, RN Outcome: Progressing 05/16/2022 1029 by Drucie Ip I, RN Outcome: Progressing Goal: Understanding of medication regimen will improve 05/16/2022 1029 by Drucie Ip I, RN Outcome: Progressing 05/16/2022 1029 by Drucie Ip I, RN Outcome: Progressing Goal: Individualized Educational Video(s) 05/16/2022 1029 by Drucie Ip I, RN Outcome: Progressing 05/16/2022 1029 by Drucie Ip I, RN Outcome: Progressing   Problem: Activity: Goal: Ability to tolerate increased activity will improve 05/16/2022 1029 by Drucie Ip I, RN Outcome: Progressing 05/16/2022 1029 by Drucie Ip I, RN Outcome: Progressing   Problem: Cardiac: Goal: Ability to achieve and maintain adequate cardiopulmonary perfusion will improve 05/16/2022 1029 by Drucie Ip I, RN Outcome: Progressing 05/16/2022 1029 by Drucie Ip I, RN Outcome: Progressing   Problem: Health Behavior/Discharge Planning: Goal: Ability to safely manage health-related needs after discharge will improve 05/16/2022 1029 by Drucie Ip I, RN Outcome: Progressing 05/16/2022 1029 by Drucie Ip I, RN Outcome: Progressing

## 2022-05-16 NOTE — Consult Note (Signed)
Cardiology Consultation:   Patient ID: Patrick [name] MRN: [id number]; DOB: Jun 10, 1956  Admit date: 05/15/2022 Date of Consult: 05/16/2022  PCP:  Kirk Ruths, MD   [facility] Providers Cardiologist:  Shelva Majestic, MD   {  EP: Dr. Renaldo Reel     Patient Profile:   Patrick [name] is a 67 y.o. male with a hx of syncope and RBBB who is being seen 05/16/2022 for the evaluation of CHB at the request of Dr. Marlou Porch.  History of Present Illness:   Patrick [name] was admitted 10 months ago with syncope due to [employer] attacks and his beta blocker was stopped and CHB resolved. He did well until yesterday when he presented with CHB and 2:1 AV block. He felt poorly though he did not pass out. He denies chest pain. No syncope. He has a remote h/o CAD with STEMI 5 years ago, and now with preserved LV Function.    Past Medical History:  Diagnosis Date   CAD in native artery    a. anterolateral STEMI 07/2017 s/p DES to prox-mid LAD, EF 55%.   GERD (gastroesophageal reflux disease)    Hyperlipidemia    Hypertension    Overactive bladder    Renal calculi    Syncope    Uncontrolled diabetes mellitus     Past Surgical History:  Procedure Laterality Date   CORONARY/GRAFT ACUTE MI REVASCULARIZATION N/A 08/04/2017   Procedure: Coronary/Graft Acute MI Revascularization;  Surgeon: Troy Sine, MD;  Location: [place] [facility];  Service: Cardiovascular;  Laterality: N/A;   LEFT HEART CATH AND CORONARY ANGIOGRAPHY N/A 08/04/2017   Procedure: LEFT HEART CATH AND CORONARY ANGIOGRAPHY;  Surgeon: Troy Sine, MD;  Location: [place] [facility];  Service: Cardiovascular;  Laterality: N/A;   LEFT HEART CATH AND CORONARY ANGIOGRAPHY N/A 03/21/2018   Procedure: LEFT HEART CATH AND CORONARY ANGIOGRAPHY;  Surgeon: Troy Sine, MD;  Location: [place] [facility];  Service: Cardiovascular;  Laterality: N/A;   PROSTATE SURGERY     TEMPORARY PACEMAKER N/A 07/14/2021   Procedure: TEMPORARY PACEMAKER;   Surgeon: Leonie Man, MD;  Location: [place] [facility];  Service: Cardiovascular;  Laterality: N/A;     Home Medications:  Prior to Admission medications   Medication Sig Start Date End Date Taking? Authorizing Provider  amLODipine (NORVASC) 10 MG tablet TAKE 1 TABLET(10 MG) BY MOUTH DAILY Patient taking differently: Take 10 mg by mouth daily. 03/27/22  Yes Hilty, Nadean Corwin, MD  aspirin (ASPIRIN LOW DOSE) 81 MG EC tablet Take 1 tablet (81 mg total) by mouth daily. 11/01/18  Yes Troy Sine, MD  clopidogrel (PLAVIX) 75 MG tablet Take 1 tablet (75 mg total) by mouth daily. 07/25/21  Yes Troy Sine, MD  Coenzyme Q10 200 MG capsule Take 200 mg by mouth daily.   Yes [provider]  Glucosamine-Chondroit-Vit C-Mn (GLUCOSAMINE 1500 COMPLEX PO) Take 1,500 mg by mouth daily.   Yes [provider]  isosorbide mononitrate (IMDUR) 60 MG 24 hr tablet Take 60 mg by mouth daily.   Yes [provider]  losartan-hydrochlorothiazide (HYZAAR) 100-12.5 MG tablet Take 1 tablet by mouth daily. 07/23/21 07/23/22 Yes [provider]  Magnesium 250 MG TABS Take 250 mg by mouth daily.   Yes [provider]  metFORMIN (GLUCOPHAGE-XR) 500 MG 24 hr tablet Take 1,000 mg by mouth 2 (two) times daily. 02/25/21  Yes [provider]  Multiple Vitamins-Minerals (ABC PLUS SENIOR PO) Take 1  tablet by mouth daily.   Yes [provider]  MYRBETRIQ 25 MG TB24 tablet TAKE 1 TABLET(25 MG) BY MOUTH DAILY Patient taking differently: Take 25 mg by mouth every evening. 12/25/21  Yes McGowan, Larene Beach A, PA-C  nitroGLYCERIN (NITROSTAT) 0.4 MG SL tablet Place 1 tablet (0.4 mg total) under the tongue every 5 (five) minutes x 3 doses as needed for chest pain. 07/25/21  Yes Troy Sine, MD  nystatin cream (MYCOSTATIN) Apply 1 application. topically daily as needed for dry skin. 11/12/21  Yes [provider]  Omega-3 Fatty Acids (FISH OIL OMEGA-3 PO) Take 2 capsules by  mouth 2 (two) times daily.    Yes [provider]  pantoprazole (PROTONIX) 40 MG tablet Take 40 mg by mouth daily.   Yes [provider]  Potassium 99 MG TABS Take 99 mg by mouth daily.   Yes [provider]  pyridOXINE (VITAMIN B-6) 50 MG tablet Take 50 mg by mouth daily.   Yes [provider]  ranolazine (RANEXA) 1000 MG SR tablet TAKE 1 TABLET(1000 MG) BY MOUTH TWICE DAILY Patient taking differently: Take 1,000 mg by mouth 2 (two) times daily. 03/27/22  Yes Troy Sine, MD  rosuvastatin (CRESTOR) 20 MG tablet Take 20 mg by mouth daily.   Yes [provider]  SEMAGLUTIDE PO Take 1 tablet by mouth daily.   Yes [provider]  tamsulosin (FLOMAX) 0.4 MG CAPS capsule Take 2 capsules (0.8 mg total) by mouth daily. Patient taking differently: Take 0.8 mg by mouth every evening. 10/21/21  Yes McGowan, Larene Beach A, PA-C  triamcinolone cream (KENALOG) 0.1 % Apply 1 application. topically daily as needed (rash). 11/12/21  Yes [provider]  vitamin C (ASCORBIC ACID) 500 MG tablet Take 500 mg by mouth daily.   Yes [provider]  vitamin E 400 UNIT capsule Take 400 Units by mouth daily.   Yes [provider]  atorvastatin (LIPITOR) 40 MG tablet TAKE 1 TABLET(40 MG) BY MOUTH EVERY EVENING Patient not taking: Reported on 05/15/2022 01/03/19   Troy Sine, MD  meclizine (ANTIVERT) 12.5 MG tablet TAKE 1 TABLET(12.5 MG) BY MOUTH THREE TIMES DAILY AS NEEDED FOR DIZZINESS Patient not taking: Reported on 05/15/2022 10/20/21   Alric Ran, MD    Inpatient Medications: Scheduled Meds:  insulin aspart  0-9 Units Subcutaneous TID WC   pantoprazole  40 mg Oral Daily   rosuvastatin  20 mg Oral Daily   tamsulosin  0.8 mg Oral QPM   Continuous Infusions:  PRN Meds: acetaminophen, ondansetron (ZOFRAN) IV  Allergies:   No Known Allergies  Social History:   Social History   Socioeconomic History   Marital status:  Married    Spouse name: Not on file   Number of children: 3   Years of education: GED   Highest education level: Not on file  Occupational History   Occupation: Energy manager  Tobacco Use   Smoking status: Former    Types: Cigarettes   Smokeless tobacco: Never   Tobacco comments:    Quit smoking at age 24.  Substance and Sexual Activity   Alcohol use: No   Drug use: No   Sexual activity: Not on file  Other Topics Concern   Not on file  Social History Narrative   Lives at home with his wife.   Right-handed.   Caffeine use: 3.5 cups per day.   Social Determinants of Health   Financial Resource Strain: Not on file  Food Insecurity: Not on file  Transportation Needs: Not on file  Physical Activity: Not on file  Stress: Not on file  Social Connections: Not on file  Intimate Partner Violence: Not on file    Family History:    Family History  Problem Relation Age of Onset   Dementia Mother    Heart disease Father    Heart attack Father      ROS:  Please see the history of present illness.   All other ROS reviewed and negative.     Physical Exam/Data:   Vitals:   05/15/22 2050 05/16/22 0018 05/16/22 0456 05/16/22 0817  BP:  113/71 115/69 (!) 151/81  Pulse:  80 76 89  Resp:  20 18 20   Temp:  98.4 F (36.9 C) (!) 97.5 F (36.4 C)   TempSrc:  Oral Axillary   SpO2:  (!) 89% 96% 93%  Weight: 99.7 kg     Height: 6' (1.829 m)       Intake/Output Summary (Last 24 hours) at 05/16/2022 0932 Last data filed at 05/16/2022 0457 Gross per 24 hour  Intake 360 ml  Output 1900 ml  Net -1540 ml      05/15/2022    8:50 PM 01/14/2022   11:25 AM 11/18/2021    4:03 PM  Last 3 Weights  Weight (lbs) 219 lb 14.4 oz 223 lb 8 oz 222 lb  Weight (kg) 99.746 kg 101.379 kg 100.699 kg     Body mass index is 29.82 kg/m.  General:  Well nourished, well developed, in no acute distress HEENT: normal Neck: no JVD Vascular: No carotid bruits; Distal pulses 2+ bilaterally Cardiac:   normal S1, S2; RRR; no murmur  Lungs:  clear to auscultation bilaterally, no wheezing, rhonchi or rales  Abd: soft, nontender, no hepatomegaly  Ext: no edema Musculoskeletal:  No deformities, BUE and BLE strength normal and equal Skin: warm and dry  Neuro:  CNs 2-12 intact, no focal abnormalities noted Psych:  Normal affect   EKG:  The EKG was personally reviewed and demonstrates:  NSR with 2:1 AV block Telemetry:  Telemetry was personally reviewed and demonstrates:  NSR  Relevant CV Studies: none  Laboratory Data:  High Sensitivity Troponin:   Recent Labs  Lab 05/15/22 1601 05/15/22 1828  TROPONINIHS 4 4     Chemistry Recent Labs  Lab 05/15/22 1601 05/16/22 0324  NA 137 137  K 4.7 3.6  CL 104 105  CO2 22 26  GLUCOSE 152* 104*  BUN 27* 20  CREATININE 1.73* 1.30*  CALCIUM 8.7* 8.8*  GFRNONAA 42* 60*  ANIONGAP 11 6    Recent Labs  Lab 05/15/22 1601  PROT 6.3*  ALBUMIN 3.9  AST 40  ALT 40  ALKPHOS 35*  BILITOT 1.0   Lipids No results for input(s): CHOL, TRIG, HDL, LABVLDL, LDLCALC, CHOLHDL in the last 168 hours.  Hematology Recent Labs  Lab 05/15/22 1601 05/16/22 0324  WBC 7.5 6.3  RBC 4.34 4.01*  HGB 13.9 12.9*  HCT 39.1 35.5*  MCV 90.1 88.5  MCH 32.0 32.2  MCHC 35.5 36.3*  RDW 12.2 12.3  PLT 181 169   Thyroid  Recent Labs  Lab 05/15/22 1938  TSH 0.732    BNPNo results for input(s): BNP, PROBNP in the last 168 hours.  DDimer No results for input(s): DDIMER in the last 168 hours.   Radiology/Studies:  DG Chest Port 1 View  Result Date: 05/15/2022 CLINICAL DATA:  Bradycardia. EXAM: PORTABLE CHEST 1  VIEW COMPARISON:  July 15, 2021. FINDINGS: Stable cardiomediastinal silhouette. Both lungs are clear. The visualized skeletal structures are unremarkable. IMPRESSION: No active disease. Aortic Atherosclerosis (ICD10-I70.0). Electronically Signed   By: Marijo Conception M.D.   On: 05/15/2022 16:19     Assessment and Plan:   CHB - he is in and  out of CHB. He is currently conduction 1:1 and I have discussed the treatment options with the patient and the risks/benefits/goals/expectations of PPM insertion were reviewed and he wishes to proceed. CAD - he is s/p MI with preserved LV function. No change in meds. He is asymptomatic.   For questions or updates, please contact [place] Please consult [email or web address] for contact info under    Signed, Cristopher Peru, MD  05/16/2022 9:32 AM

## 2022-05-16 NOTE — Plan of Care (Signed)
  Problem: Cardiac: Goal: Ability to achieve and maintain adequate cardiopulmonary perfusion will improve Outcome: Progressing   

## 2022-05-17 ENCOUNTER — Encounter (HOSPITAL_COMMUNITY)
Admission: EM | Disposition: A | Discharge status: Home / Self Care | Payer: Self-pay | Source: Home / Self Care | Attending: Cardiology

## 2022-05-17 DIAGNOSIS — Z950 Presence of cardiac pacemaker: Secondary | ICD-10-CM | POA: Diagnosis not present

## 2022-05-17 DIAGNOSIS — I4510 Unspecified right bundle-branch block: Secondary | ICD-10-CM | POA: Diagnosis present

## 2022-05-17 DIAGNOSIS — I442 Atrioventricular block, complete: Secondary | ICD-10-CM | POA: Diagnosis not present

## 2022-05-17 HISTORY — DX: Presence of cardiac pacemaker: Z95.0

## 2022-05-17 HISTORY — PX: PACEMAKER IMPLANT: EP1218

## 2022-05-17 LAB — GLUCOSE, CAPILLARY
Glucose-Capillary: 138 mg/dL — ABNORMAL HIGH (ref 70–99)
Glucose-Capillary: 144 mg/dL — ABNORMAL HIGH (ref 70–99)
Glucose-Capillary: 138 mg/dL — ABNORMAL HIGH (ref 70–99)

## 2022-05-17 SURGERY — PACEMAKER IMPLANT

## 2022-05-17 MED ORDER — SODIUM CHLORIDE 0.9 % IV SOLN
INTRAVENOUS | Status: AC
  Filled 2022-05-17: qty 2

## 2022-05-17 MED ORDER — MIDAZOLAM HCL 5 MG/5ML IJ SOLN
INTRAMUSCULAR | Status: DC | PRN
  Administered 2022-05-17: 2 mg via INTRAVENOUS
  Administered 2022-05-17: 1 mg via INTRAVENOUS
  Administered 2022-05-17: 2 mg via INTRAVENOUS

## 2022-05-17 MED ORDER — LIDOCAINE HCL (PF) 1 % IJ SOLN
INTRAMUSCULAR | Status: AC
  Filled 2022-05-17: qty 60

## 2022-05-17 MED ORDER — MIDAZOLAM HCL 5 MG/5ML IJ SOLN
INTRAMUSCULAR | Status: AC
  Filled 2022-05-17: qty 5

## 2022-05-17 MED ORDER — CLOPIDOGREL BISULFATE 75 MG PO TABS: 75.0000 mg | ORAL_TABLET | Freq: Every day | ORAL | 3 refills | Status: DC

## 2022-05-17 MED ORDER — ACETAMINOPHEN 325 MG PO TABS: 650.0000 mg | ORAL_TABLET | ORAL | Status: DC | PRN

## 2022-05-17 MED ORDER — FENTANYL CITRATE (PF) 100 MCG/2ML IJ SOLN
INTRAMUSCULAR | Status: AC
Start: 2022-05-17 — End: ?
  Filled 2022-05-17: qty 2

## 2022-05-17 MED ORDER — ACETAMINOPHEN 325 MG PO TABS: 325.0000 mg | ORAL_TABLET | ORAL | Status: DC | PRN

## 2022-05-17 MED ORDER — CEFAZOLIN SODIUM-DEXTROSE 1-4 GM/50ML-% IV SOLN
1.0000 g | Freq: Once | INTRAVENOUS | Status: AC
  Administered 2022-05-17: 1 g via INTRAVENOUS
  Filled 2022-05-17: qty 50

## 2022-05-17 MED ORDER — LIDOCAINE HCL (PF) 1 % IJ SOLN
INTRAMUSCULAR | Status: DC | PRN
Start: 2022-05-17 — End: 2022-05-17
  Administered 2022-05-17: 60 mL

## 2022-05-17 MED ORDER — ONDANSETRON HCL 4 MG/2ML IJ SOLN: 4.0000 mg | Freq: Four times a day (QID) | INTRAMUSCULAR | Status: DC | PRN

## 2022-05-17 MED ORDER — CEFAZOLIN SODIUM-DEXTROSE 2-4 GM/100ML-% IV SOLN
INTRAVENOUS | Status: AC
  Filled 2022-05-17: qty 100

## 2022-05-17 MED ORDER — FENTANYL CITRATE (PF) 100 MCG/2ML IJ SOLN
INTRAMUSCULAR | Status: DC | PRN
Start: 2022-05-17 — End: 2022-05-17
  Administered 2022-05-17: 12.5 ug via INTRAVENOUS
  Administered 2022-05-17 (×2): 25 ug via INTRAVENOUS

## 2022-05-17 SURGICAL SUPPLY — 11 items
CABLE SURGICAL S-101-97-12 (CABLE) ×2 IMPLANT
KIT ACCESSORY SELECTRA FIX CVD (MISCELLANEOUS) ×1 IMPLANT
LEAD SELECTRA 3D-65-42 (CATHETERS) ×1 IMPLANT
LEAD SOLIA S PRO MRI 53 (Lead) ×1 IMPLANT
LEAD SOLIA S PRO MRI 60 (Lead) ×1 IMPLANT
MAT PREVALON FULL STRYKER (MISCELLANEOUS) ×1 IMPLANT
PACEMAKER EDORA 8DR-T MRI (Pacemaker) ×1 IMPLANT
PAD DEFIB RADIO PHYSIO CONN (PAD) ×2 IMPLANT
SHEATH 7FR PRELUDE SNAP 13 (SHEATH) ×1 IMPLANT
SHEATH 9FR PRELUDE SNAP 13 (SHEATH) ×1 IMPLANT
TRAY PACEMAKER INSERTION (PACKS) ×2 IMPLANT

## 2022-05-17 NOTE — H&P (Signed)
Progress Note  Patient Name: Patrick [name] Date of Encounter: 05/17/2022  Primary Cardiologist: Shelva Majestic, MD   Subjective   Doing well this morning with no chest pain or sob. No syncope  Inpatient Medications    Scheduled Meds:  gentamicin irrigation  80 mg Irrigation On Call   insulin aspart  0-9 Units Subcutaneous TID WC   pantoprazole  40 mg Oral Daily   rosuvastatin  20 mg Oral Daily   tamsulosin  0.8 mg Oral QPM   Continuous Infusions:  sodium chloride 50 mL/hr at 05/17/22 0633    ceFAZolin (ANCEF) IV     PRN Meds: acetaminophen, ondansetron (ZOFRAN) IV   Vital Signs    Vitals:   05/16/22 0817 05/16/22 2006 05/17/22 0009 05/17/22 0339  BP: (!) 151/81 123/72 128/69 127/73  Pulse: 89  74 67  Resp: 20 15 17 13   Temp:  98.3 F (36.8 C) 97.9 F (36.6 C) (!) 97.3 F (36.3 C)  TempSrc:  Oral Axillary Axillary  SpO2: 93%  97% 98%  Weight:      Height:        Intake/Output Summary (Last 24 hours) at 05/17/2022 0747 Last data filed at 05/17/2022 0500 Gross per 24 hour  Intake 451.86 ml  Output 850 ml  Net -398.14 ml   Filed Weights   05/15/22 2050  Weight: 99.7 kg    Telemetry    Nsr  - Personally Reviewed  ECG    none - Personally Reviewed  Physical Exam   GEN: No acute distress.   Neck: No JVD Cardiac: RRR, no murmurs, rubs, or gallops.  Respiratory: Clear to auscultation bilaterally. GI: Soft, nontender, non-distended  MS: No edema; No deformity. Neuro:  Nonfocal  Psych: Normal affect   Labs    Chemistry Recent Labs  Lab 05/15/22 1601 05/16/22 0324  NA 137 137  K 4.7 3.6  CL 104 105  CO2 22 26  GLUCOSE 152* 104*  BUN 27* 20  CREATININE 1.73* 1.30*  CALCIUM 8.7* 8.8*  PROT 6.3*  --   ALBUMIN 3.9  --   AST 40  --   ALT 40  --   ALKPHOS 35*  --   BILITOT 1.0  --   GFRNONAA 42* 60*  ANIONGAP 11 6     Hematology Recent Labs  Lab 05/15/22 1601 05/16/22 0324  WBC 7.5 6.3  RBC 4.34 4.01*  HGB 13.9 12.9*  HCT 39.1  35.5*  MCV 90.1 88.5  MCH 32.0 32.2  MCHC 35.5 36.3*  RDW 12.2 12.3  PLT 181 169    Cardiac EnzymesNo results for input(s): TROPONINI in the last 168 hours. No results for input(s): TROPIPOC in the last 168 hours.   BNPNo results for input(s): BNP, PROBNP in the last 168 hours.   DDimer No results for input(s): DDIMER in the last 168 hours.   Radiology    DG Chest Port 1 View  Result Date: 05/15/2022 CLINICAL DATA:  Bradycardia. EXAM: PORTABLE CHEST 1 VIEW COMPARISON:  July 15, 2021. FINDINGS: Stable cardiomediastinal silhouette. Both lungs are clear. The visualized skeletal structures are unremarkable. IMPRESSION: No active disease. Aortic Atherosclerosis (ICD10-I70.0). Electronically Signed   By: Marijo Conception M.D.   On: 05/15/2022 16:19   ECHOCARDIOGRAM COMPLETE  Result Date: 05/16/2022    ECHOCARDIOGRAM REPORT   Patient Name:   Patrick [name] Date of Exam: 05/16/2022 Medical Rec #:  [id number]    Height:       72.0  in Accession #:    [id number]   Weight:       219.9 lb Date of Birth:  05-May-1956    BSA:          2.218 m Patient Age:    87 years     BP:           151/81 mmHg Patient Gender: M            HR:           78 bpm. Exam Location:  [facility] Procedure: 2D Echo, Cardiac Doppler and Color Doppler Indications:    Bradycardia  History:        Patient has prior history of Echocardiogram examinations, most                 recent 07/14/2021. Previous Myocardial Infarction and CAD,                 Arrythmias:Bradycardia; Risk Factors:Hypertension, Diabetes and                 Dyslipidemia.  Sonographer:    Wenda Low Referring Phys: [place]  1. Left ventricular ejection fraction, by estimation, is 55 to 60%. The left ventricle has normal function. The left ventricle has no regional wall motion abnormalities. Left ventricular diastolic parameters are indeterminate.  2. Right ventricular systolic function is normal. The right ventricular size is mildly enlarged.  There is normal pulmonary artery systolic pressure.  3. Left atrial size was mildly dilated.  4. The mitral valve is normal in structure. Trivial mitral valve regurgitation. No evidence of mitral stenosis.  5. The aortic valve is grossly normal. Aortic valve regurgitation is not visualized. No aortic stenosis is present.  6. The inferior vena cava is normal in size with greater than 50% respiratory variability, suggesting right atrial pressure of 3 mmHg. Comparison(s): No significant change from prior study. Conclusion(s)/Recommendation(s): Normal biventricular function without evidence of hemodynamically significant valvular heart disease. FINDINGS  Left Ventricle: Left ventricular ejection fraction, by estimation, is 55 to 60%. The left ventricle has normal function. The left ventricle has no regional wall motion abnormalities. The left ventricular internal cavity size was normal in size. There is  no left ventricular hypertrophy. Abnormal (paradoxical) septal motion, consistent with left bundle branch block. Left ventricular diastolic parameters are indeterminate. Right Ventricle: The right ventricular size is mildly enlarged. Right vetricular wall thickness was not well visualized. Right ventricular systolic function is normal. There is normal pulmonary artery systolic pressure. The tricuspid regurgitant velocity  is 1.47 m/s, and with an assumed right atrial pressure of 3 mmHg, the estimated right ventricular systolic pressure is 123XX123 mmHg. Left Atrium: Left atrial size was mildly dilated. Right Atrium: Right atrial size was normal in size. Pericardium: There is no evidence of pericardial effusion. Mitral Valve: The mitral valve is normal in structure. Trivial mitral valve regurgitation. No evidence of mitral valve stenosis. MV peak gradient, 2.8 mmHg. The mean mitral valve gradient is 1.0 mmHg. Tricuspid Valve: The tricuspid valve is normal in structure. Tricuspid valve regurgitation is trivial. No evidence of  tricuspid stenosis. Aortic Valve: The aortic valve is grossly normal. Aortic valve regurgitation is not visualized. No aortic stenosis is present. Aortic valve mean gradient measures 3.0 mmHg. Aortic valve peak gradient measures 5.5 mmHg. Aortic valve area, by VTI measures 2.44 cm. Pulmonic Valve: The pulmonic valve was grossly normal. Pulmonic valve regurgitation is trivial. No evidence of pulmonic stenosis. Aorta: The aortic  root, ascending aorta, aortic arch and descending aorta are all structurally normal, with no evidence of dilitation or obstruction. Venous: The inferior vena cava is normal in size with greater than 50% respiratory variability, suggesting right atrial pressure of 3 mmHg. IAS/Shunts: The atrial septum is grossly normal.  LEFT VENTRICLE PLAX 2D LVIDd:         5.10 cm      Diastology LVIDs:         3.10 cm      LV e' medial:    6.85 cm/s LV PW:         1.00 cm      LV E/e' medial:  9.9 LV IVS:        1.00 cm      LV e' lateral:   10.00 cm/s LVOT diam:     2.00 cm      LV E/e' lateral: 6.8 LV SV:         65 LV SV Index:   29 LVOT Area:     3.14 cm  LV Volumes (MOD) LV vol d, MOD A2C: 66.6 ml LV vol d, MOD A4C: 106.0 ml LV vol s, MOD A2C: 30.1 ml LV vol s, MOD A4C: 40.6 ml LV SV MOD A2C:     36.5 ml LV SV MOD A4C:     106.0 ml LV SV MOD BP:      51.2 ml RIGHT VENTRICLE RV Basal diam:  4.10 cm RV Mid diam:    3.30 cm RV S prime:     10.80 cm/s TAPSE (M-mode): 2.1 cm LEFT ATRIUM             Index        RIGHT ATRIUM           Index LA diam:        4.00 cm 1.80 cm/m   RA Area:     16.80 cm LA Vol (A2C):   78.1 ml 35.21 ml/m  RA Volume:   47.40 ml  21.37 ml/m LA Vol (A4C):   65.4 ml 29.48 ml/m LA Biplane Vol: 76.2 ml 34.35 ml/m  AORTIC VALVE                    PULMONIC VALVE AV Area (Vmax):    2.44 cm     PV Vmax:       0.66 m/s AV Area (Vmean):   2.38 cm     PV Peak grad:  1.7 mmHg AV Area (VTI):     2.44 cm AV Vmax:           117.00 cm/s AV Vmean:          81.000 cm/s AV VTI:             0.267 m AV Peak Grad:      5.5 mmHg AV Mean Grad:      3.0 mmHg LVOT Vmax:         90.90 cm/s LVOT Vmean:        61.400 cm/s LVOT VTI:          0.207 m LVOT/AV VTI ratio: 0.78  AORTA Ao Root diam: 3.40 cm Ao Asc diam:  3.20 cm MITRAL VALVE               TRICUSPID VALVE MV Area (PHT): 3.10 cm    TR Peak grad:   8.6 mmHg MV Area VTI:   3.14 cm    TR Vmax:  147.00 cm/s MV Peak grad:  2.8 mmHg MV Mean grad:  1.0 mmHg    SHUNTS MV Vmax:       0.83 m/s    Systemic VTI:  0.21 m MV Vmean:      44.7 cm/s   Systemic Diam: 2.00 cm MV Decel Time: 245 msec MV E velocity: 67.60 cm/s MV A velocity: 65.10 cm/s MV E/A ratio:  1.04 Buford Dresser MD Electronically signed by Buford Dresser MD Signature Date/Time: 05/16/2022/1:37:22 PM    Final     Cardiac Studies   See above  Patient Profile     66 y.o. male admitted with symptomatic CHB/2:1 AV block  Assessment & Plan    High grade heart block - he did not have more over night. We will plan to proceed with PPM insertion this morning. The risks/benefits/goals/expectations reviewed and he wishes to proceed. CAD - he denies anginal symptoms.   For questions or updates, please contact [place] Please consult [email or web address] for contact info under Cardiology/STEMI.      Signed, Cristopher Peru, MD  05/17/2022, 7:47 AM

## 2022-05-17 NOTE — Plan of Care (Signed)
  Problem: Education: Goal: Knowledge of disease or condition will improve Outcome: Progressing Goal: Understanding of medication regimen will improve Outcome: Progressing Goal: Individualized Educational Video(s) Outcome: Progressing   Problem: Activity: Goal: Ability to tolerate increased activity will improve Outcome: Progressing   Problem: Cardiac: Goal: Ability to achieve and maintain adequate cardiopulmonary perfusion will improve Outcome: Progressing   Problem: Health Behavior/Discharge Planning: Goal: Ability to safely manage health-related needs after discharge will improve Outcome: Progressing   Problem: Education: Goal: Knowledge of cardiac device and self-care will improve Outcome: Progressing Goal: Ability to safely manage health related needs after discharge will improve Outcome: Progressing Goal: Individualized Educational Video(s) Outcome: Progressing   Problem: Cardiac: Goal: Ability to achieve and maintain adequate cardiopulmonary perfusion will improve Outcome: Progressing

## 2022-05-17 NOTE — Discharge Summary (Addendum)
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 Successful implantation of a Biotronik dual-chamber pacemaker for symptomatic bradycardia due to CHB  2. No early apparent complications.      Echo 05/16/22 IMPRESSIONS     1. Left ventricular ejection fraction, by estimation, is 55 to 60%. The  left ventricle has normal function. The left ventricle has no regional  wall motion abnormalities. Left ventricular diastolic parameters are  indeterminate.   2. Right ventricular systolic function is normal. The right ventricular  size is mildly enlarged. There is normal pulmonary artery systolic  pressure.   3. Left atrial size was mildly dilated.   4. The mitral valve is normal in structure. Trivial mitral valve  regurgitation. No evidence of mitral stenosis.   5. The aortic valve is grossly normal. Aortic valve regurgitation is not  visualized. No aortic stenosis is present.   6. The inferior vena cava is normal in size with greater than 50%  respiratory variability, suggesting right atrial pressure of 3 mmHg.   Comparison(s): No significant change from prior study.   Conclusion(s)/Recommendation(s): Normal biventricular function without  evidence of hemodynamically significant valvular heart disease.   FINDINGS   Left Ventricle: Left ventricular ejection fraction, by estimation, is 55  to 60%. The left ventricle has normal  function. The left ventricle has no  regional wall motion abnormalities. The left ventricular internal cavity  size was normal in size. There is   no left ventricular hypertrophy. Abnormal (paradoxical) septal motion,  consistent with left bundle branch block. Left ventricular diastolic  parameters are indeterminate.   Right Ventricle: The right ventricular size is mildly enlarged. Right  vetricular wall thickness was not well visualized. Right ventricular  systolic function is normal. There is normal pulmonary artery systolic  pressure. The tricuspid regurgitant velocity   is 1.47 m/s, and with an assumed right atrial pressure of 3 mmHg, the  estimated right ventricular systolic pressure is 11.6 mmHg.   Left Atrium: Left atrial size was mildly dilated.   Right Atrium: Right atrial size was normal in size.   Pericardium: There is no evidence of pericardial effusion.   Mitral Valve: The mitral valve is normal in structure. Trivial mitral  valve regurgitation. No evidence of mitral valve stenosis. MV peak  gradient, 2.8 mmHg. The mean mitral valve gradient is 1.0 mmHg.   Tricuspid Valve: The tricuspid valve is normal in structure. Tricuspid  valve regurgitation is trivial. No evidence of tricuspid stenosis.   Aortic Valve: The aortic valve is grossly normal. Aortic valve  regurgitation is not visualized. No aortic stenosis is present. Aortic  valve mean gradient measures 3.0 mmHg. Aortic valve peak gradient measures  5.5 mmHg. Aortic valve area, by VTI measures  2.44 cm.   Pulmonic Valve: The pulmonic valve was grossly normal. Pulmonic valve  regurgitation is trivial. No evidence of pulmonic stenosis.   Aorta:  The aortic root, ascending aorta, aortic arch and descending aorta  are all structurally normal, with no evidence of dilitation or  obstruction.   Venous: The inferior vena cava is normal in size with greater than 50%  respiratory variability, suggesting right atrial  pressure of 3 mmHg.   IAS/Shunts: The atrial septum is grossly normal.  ____ _________   History of Present Illness     Patrick [name] is a 66 y.o. male with  known CAD prior anterolateral STEMI in 2018 with DES to the proximal mid LAD, normal ejection fraction with hypertension hyperlipidemia and prior syncope who in July 2022 had a temporary pacemaker wire placed because of high degree AV block.  Dr. Graciela Husbands was able to remove the temporary pacemaker wire after washout of metoprolol and he has been doing well over the past year.  05/15/22 pt noted by his android watch his HR was in the 30s for prolonged period of time.  He was symptomatic with dizziness, general felt bad.  He had to sit down several times.  He was given atropine in ER without any changes.     EKG initially was 2:1 AV block HR 51 and chronic RBBB. No acute ST changes.   EMS run sheet with HR in 30s with 3:1 block and consistent PR.    He is not on rate lowering meds.   His Cr was elevated at 1.73.  He was admitted to stay on bedrest with plan for EP consult and possible permanent pacemaker.    TSH 0.732.  PCXR NAD.   Hospital Course     Consultants: EP consult with Dr. Ladona Ridgel  Dr Ladona Ridgel saw on 05/16/22 and pt was at that time in and out of CHB.  Dr. Ladona Ridgel recommended PPM to pt and his wife and they were in agreement.     Echo done with EF 55-60% RV size mildly enlarged LA mildly dilated Valves stable.    Today pt underwent PPM with Dr. Ladona Ridgel with Biotronik dual chamber PPM without complications.   Plan to resume outpt meds except will hold plavix until 05/22/22.    I have sent message to scheduling and EP scheduling to make appts. Wound check in 10 days and Dr. Ladona Ridgel in 3 months. Dr. Tresa Endo in 2 weeks.  Pt has been seen and evaluated by Dr. Ladona Ridgel and found stable for discharge.     Did the patient have an acute coronary syndrome (MI, NSTEMI, STEMI, etc) this admission?:  No                               Did the patient have a  percutaneous coronary intervention (stent / angioplasty)?: NO      The patient will be scheduled for a TOC follow up appointment in 10 days.  A message has been sent to the [facility] and Scheduling Pool at the office where the patient should be seen for follow up.  _____________  Discharge Vitals Blood pressure 138/89, pulse 81, temperature (!) 97.3 F (36.3 C), temperature source Axillary, resp. rate 14, height 6' (1.829 m), weight 99.7 kg, SpO2 96 %.  Filed Weights   05/15/22 2050  Weight: 99.7 kg    Labs & Radiologic Studies    CBC Recent Labs    05/15/22 1601 05/16/22 0324  WBC 7.5 6.3  HGB 13.9 12.9*  HCT 39.1 35.5*  MCV 90.1 88.5  PLT 181 169  Basic Metabolic Panel Recent Labs    32/44/01 1601 05/16/22 0324  NA 137 137  K 4.7 3.6  CL 104 105  CO2 22 26  GLUCOSE 152* 104*  BUN 27* 20  CREATININE 1.73* 1.30*  CALCIUM 8.7* 8.8*   Liver Function Tests Recent Labs    05/15/22 1601  AST 40  ALT 40  ALKPHOS 35*  BILITOT 1.0  PROT 6.3*  ALBUMIN 3.9   No results for input(s): LIPASE, AMYLASE in the last 72 hours. High Sensitivity Troponin:   Recent Labs  Lab 05/15/22 1601 05/15/22 1828  TROPONINIHS 4 4    BNP Invalid input(s): POCBNP D-Dimer No results for input(s): DDIMER in the last 72 hours. Hemoglobin A1C No results for input(s): HGBA1C in the last 72 hours. Fasting Lipid Panel No results for input(s): CHOL, HDL, LDLCALC, TRIG, CHOLHDL, LDLDIRECT in the last 72 hours. Thyroid Function Tests Recent Labs    05/15/22 1938  TSH 0.732   _____________  EP PPM/ICD IMPLANT  Result Date: 05/17/2022 CONCLUSIONS:  1. Successful implantation of a Biotronik dual-chamber pacemaker for symptomatic bradycardia due to CHB  2. No early apparent complications.       Lewayne Bunting, MD 05/17/2022 9:48 AM    DG Chest Port 1 View  Result Date: 05/15/2022 CLINICAL DATA:  Bradycardia. EXAM: PORTABLE CHEST 1 VIEW COMPARISON:  July 15, 2021. FINDINGS: Stable  cardiomediastinal silhouette. Both lungs are clear. The visualized skeletal structures are unremarkable. IMPRESSION: No active disease. Aortic Atherosclerosis (ICD10-I70.0). Electronically Signed   By: Lupita Raider M.D.   On: 05/15/2022 16:19   ECHOCARDIOGRAM COMPLETE  Result Date: 05/16/2022    ECHOCARDIOGRAM REPORT   Patient Name:   Patrick [name] Date of Exam: 05/16/2022 Medical Rec #:  [id number]    Height:       72.0 in Accession #:    [id number]   Weight:       219.9 lb Date of Birth:  07/27/1956    BSA:          2.218 m Patient Age:    66 years     BP:           151/81 mmHg Patient Gender: M            HR:           78 bpm. [facility] Procedure: 2D Echo, Cardiac Doppler and Color Doppler Indications:    Bradycardia  History:        Patient has prior history of Echocardiogram examinations, most                 recent 07/14/2021. Previous Myocardial Infarction and CAD,                 Arrythmias:Bradycardia; Risk Factors:Hypertension, Diabetes and                 Dyslipidemia.  Sonographer:    Mikki Harbor Referring Phys: [id number] DAYNA N DUNN IMPRESSIONS  1. Left ventricular ejection fraction, by estimation, is 55 to 60%. The left ventricle has normal function. The left ventricle has no regional wall motion abnormalities. Left ventricular diastolic parameters are indeterminate.  2. Right ventricular systolic function is normal. The right ventricular size is mildly enlarged. There is normal pulmonary artery systolic pressure.  3. Left atrial size was mildly dilated.  4. The mitral valve is normal in structure. Trivial mitral valve regurgitation. No evidence of mitral stenosis.  5. The aortic valve  is grossly normal. Aortic valve regurgitation is not visualized. No aortic stenosis is present.  6. The inferior vena cava is normal in size with greater than 50% respiratory variability, suggesting right atrial pressure of 3 mmHg. Comparison(s): No significant change from prior study.  Conclusion(s)/Recommendation(s): Normal biventricular function without evidence of hemodynamically significant valvular heart disease. FINDINGS  Left Ventricle: Left ventricular ejection fraction, by estimation, is 55 to 60%. The left ventricle has normal function. The left ventricle has no regional wall motion abnormalities. The left ventricular internal cavity size was normal in size. There is  no left ventricular hypertrophy. Abnormal (paradoxical) septal motion, consistent with left bundle branch block. Left ventricular diastolic parameters are indeterminate. Right Ventricle: The right ventricular size is mildly enlarged. Right vetricular wall thickness was not well visualized. Right ventricular systolic function is normal. There is normal pulmonary artery systolic pressure. The tricuspid regurgitant velocity  is 1.47 m/s, and with an assumed right atrial pressure of 3 mmHg, the estimated right ventricular systolic pressure is 11.6 mmHg. Left Atrium: Left atrial size was mildly dilated. Right Atrium: Right atrial size was normal in size. Pericardium: There is no evidence of pericardial effusion. Mitral Valve: The mitral valve is normal in structure. Trivial mitral valve regurgitation. No evidence of mitral valve stenosis. MV peak gradient, 2.8 mmHg. The mean mitral valve gradient is 1.0 mmHg. Tricuspid Valve: The tricuspid valve is normal in structure. Tricuspid valve regurgitation is trivial. No evidence of tricuspid stenosis. Aortic Valve: The aortic valve is grossly normal. Aortic valve regurgitation is not visualized. No aortic stenosis is present. Aortic valve mean gradient measures 3.0 mmHg. Aortic valve peak gradient measures 5.5 mmHg. Aortic valve area, by VTI measures 2.44 cm. Pulmonic Valve: The pulmonic valve was grossly normal. Pulmonic valve regurgitation is trivial. No evidence of pulmonic stenosis. Aorta: The aortic root, ascending aorta, aortic arch and descending aorta are all structurally  normal, with no evidence of dilitation or obstruction. Venous: The inferior vena cava is normal in size with greater than 50% respiratory variability, suggesting right atrial pressure of 3 mmHg. IAS/Shunts: The atrial septum is grossly normal.  LEFT VENTRICLE PLAX 2D LVIDd:         5.10 cm      Diastology LVIDs:         3.10 cm      LV e' medial:    6.85 cm/s LV PW:         1.00 cm      LV E/e' medial:  9.9 LV IVS:        1.00 cm      LV e' lateral:   10.00 cm/s LVOT diam:     2.00 cm      LV E/e' lateral: 6.8 LV SV:         65 LV SV Index:   29 LVOT Area:     3.14 cm  LV Volumes (MOD) LV vol d, MOD A2C: 66.6 ml LV vol d, MOD A4C: 106.0 ml LV vol s, MOD A2C: 30.1 ml LV vol s, MOD A4C: 40.6 ml LV SV MOD A2C:     36.5 ml LV SV MOD A4C:     106.0 ml LV SV MOD BP:      51.2 ml RIGHT VENTRICLE RV Basal diam:  4.10 cm RV Mid diam:    3.30 cm RV S prime:     10.80 cm/s TAPSE (M-mode): 2.1 cm LEFT ATRIUM  Index        RIGHT ATRIUM           Index LA diam:        4.00 cm 1.80 cm/m   RA Area:     16.80 cm LA Vol (A2C):   78.1 ml 35.21 ml/m  RA Volume:   47.40 ml  21.37 ml/m LA Vol (A4C):   65.4 ml 29.48 ml/m LA Biplane Vol: 76.2 ml 34.35 ml/m  AORTIC VALVE                    PULMONIC VALVE AV Area (Vmax):    2.44 cm     PV Vmax:       0.66 m/s AV Area (Vmean):   2.38 cm     PV Peak grad:  1.7 mmHg AV Area (VTI):     2.44 cm AV Vmax:           117.00 cm/s AV Vmean:          81.000 cm/s AV VTI:            0.267 m AV Peak Grad:      5.5 mmHg AV Mean Grad:      3.0 mmHg LVOT Vmax:         90.90 cm/s LVOT Vmean:        61.400 cm/s LVOT VTI:          0.207 m LVOT/AV VTI ratio: 0.78  AORTA Ao Root diam: 3.40 cm Ao Asc diam:  3.20 cm MITRAL VALVE               TRICUSPID VALVE MV Area (PHT): 3.10 cm    TR Peak grad:   8.6 mmHg MV Area VTI:   3.14 cm    TR Vmax:        147.00 cm/s MV Peak grad:  2.8 mmHg MV Mean grad:  1.0 mmHg    SHUNTS MV Vmax:       0.83 m/s    Systemic VTI:  0.21 m MV Vmean:      44.7 cm/s    Systemic Diam: 2.00 cm MV Decel Time: 245 msec MV E velocity: 67.60 cm/s MV A velocity: 65.10 cm/s MV E/A ratio:  1.04 Jodelle RedBridgette Christopher MD Electronically signed by Jodelle RedBridgette Christopher MD Signature Date/Time: 05/16/2022/1:37:22 PM    Final     Disposition   Pt is being discharged home today in good condition.  Follow-up Plans & Appointments   Heart Healthy diabetic diet  No Plavix until 05/22/22  Pacemaker discharge instructions  Follow-up Information     [facility] [address] [facility].   Specialty: Cardiology Why: the office should call you on Tuesday to arrange [facility]  if you have not heard by Wed. call the office. Contact information: [address], [address] [place] [id number] [phone]        Marinus Mawaylor, Tifany Hirsch W, MD Follow up.   Specialty: Cardiology Why: the office should call you for follow up appt Contact information: [address] [place] [address] [phone]         Lennette BihariKelly, Thomas A, MD Follow up.   Specialty: Cardiology Why: the office should call you for follow up appt. Contact information: [address] [place] [address] [phone]                  Discharge  Medications   Allergies as of 05/17/2022   No Known Allergies      Medication List     STOP taking these medications    atorvastatin 40 MG tablet Commonly known as: LIPITOR   meclizine 12.5 MG tablet Commonly known as: ANTIVERT   vitamin E 180 MG (400 UNITS) capsule       TAKE these medications    ABC PLUS SENIOR PO Take 1 tablet by mouth daily.   acetaminophen 325 MG tablet Commonly known as: TYLENOL Take 2 tablets (650 mg total) by mouth every 4 (four) hours as needed for headache or mild pain.   amLODipine 10 MG tablet Commonly known as: NORVASC TAKE 1 TABLET(10 MG) BY MOUTH DAILY What changed: See the new instructions.   aspirin EC 81 MG  tablet Commonly known as: Aspirin Low Dose Take 1 tablet (81 mg total) by mouth daily.   clopidogrel 75 MG tablet Commonly known as: PLAVIX Take 1 tablet (75 mg total) by mouth daily. Start taking on: May 22, 2022 What changed: These instructions start on May 22, 2022. If you are unsure what to do until then, ask your doctor or other care provider.   Coenzyme Q10 200 MG capsule Take 200 mg by mouth daily.   FISH OIL OMEGA-3 PO Take 2 capsules by mouth 2 (two) times daily.   GLUCOSAMINE 1500 COMPLEX PO Take 1,500 mg by mouth daily.   isosorbide mononitrate 60 MG 24 hr tablet Commonly known as: IMDUR Take 60 mg by mouth daily.   losartan-hydrochlorothiazide 100-12.5 MG tablet Commonly known as: HYZAAR Take 1 tablet by mouth daily.   Magnesium 250 MG Tabs Take 250 mg by mouth daily.   metFORMIN 500 MG 24 hr tablet Commonly known as: GLUCOPHAGE-XR Take 1,000 mg by mouth 2 (two) times daily.   Myrbetriq 25 MG Tb24 tablet Generic drug: mirabegron ER TAKE 1 TABLET(25 MG) BY MOUTH DAILY What changed: See the new instructions.   nitroGLYCERIN 0.4 MG SL tablet Commonly known as: NITROSTAT Place 1 tablet (0.4 mg total) under the tongue every 5 (five) minutes x 3 doses as needed for chest pain.   nystatin cream Commonly known as: MYCOSTATIN Apply 1 application. topically daily as needed for dry skin.   pantoprazole 40 MG tablet Commonly known as: PROTONIX Take 40 mg by mouth daily.   Potassium 99 MG Tabs Take 99 mg by mouth daily.   pyridOXINE 50 MG tablet Commonly known as: VITAMIN B-6 Take 50 mg by mouth daily.   ranolazine 1000 MG SR tablet Commonly known as: RANEXA TAKE 1 TABLET(1000 MG) BY MOUTH TWICE DAILY What changed: See the new instructions.   rosuvastatin 20 MG tablet Commonly known as: CRESTOR Take 20 mg by mouth daily.   SEMAGLUTIDE PO Take 1 tablet by mouth daily.   tamsulosin 0.4 MG Caps capsule Commonly known as: FLOMAX Take 2 capsules (0.8  mg total) by mouth daily. What changed: when to take this   triamcinolone cream 0.1 % Commonly known as: KENALOG Apply 1 application. topically daily as needed (rash).   vitamin C 500 MG tablet Commonly known as: ASCORBIC ACID Take 500 mg by mouth daily.           Outstanding Labs/Studies   BMP   Duration of Discharge Encounter   Greater than 30 minutes including physician time.  Signed, Nada Boozer, NP 05/17/2022, 1:52 PM  EP Attending  Agree with above. The patient is doing well. PM interrogation demonstrates normal DDD  PM function. He will be discharged home with usual followup.   Sharlot Gowda Lulamae Skorupski,MD

## 2022-05-17 NOTE — Discharge Instructions (Addendum)
Heart Healthy diabetic diet  No Plavix until 05/22/22     Supplemental Discharge Instructions for  Pacemaker Patients  Activity No heavy lifting or vigorous activity with your left/right arm for 6 to 8 weeks.  Do not raise your left/right arm above your head for one week.  Gradually raise your affected arm as drawn below.          05/20/22                        05/21/22                   05/23/22                    05/25/22 __  NO DRIVING for  1 week   ; you may begin driving on  12/26/08   .  WOUND CARE Keep the wound area clean and dry.  Do not get this area wet for one week. No showers for one week; you may shower on  05/24/22   . The tape/steri-strips on your wound will fall off; do not pull them off.  No bandage is needed on the site.  DO  NOT apply any creams, oils, or ointments to the wound area. If you notice any drainage or discharge from the wound, any swelling or bruising at the site, or you develop a fever > 101? F after you are discharged home, call the office at once.  Special Instructions You are still able to use cellular telephones; use the ear opposite the side where you have your pacemaker/defibrillator.  Avoid carrying your cellular phone near your device. When traveling through airports, show security personnel your identification card to avoid being screened in the metal detectors.  Ask the security personnel to use the hand wand. Avoid arc welding equipment, MRI testing (magnetic resonance imaging), TENS units (transcutaneous nerve stimulators).  Call the office for questions about other devices. Avoid electrical appliances that are in poor condition or are not properly grounded. Microwave ovens are safe to be near or to operate.

## 2022-05-17 NOTE — Interval H&P Note (Signed)
History and Physical Interval Note:  05/17/2022 7:51 AM  Patrick [name]  has presented today for surgery, with the diagnosis of CHB.  The various methods of treatment have been discussed with the patient and family. After consideration of risks, benefits and other options for treatment, the patient has consented to  Procedure(s): PACEMAKER IMPLANT (N/A) as a surgical intervention.  The patient's history has been reviewed, patient examined, no change in status, stable for surgery.  I have reviewed the patient's chart and labs.  Questions were answered to the patient's satisfaction.     Lewayne Bunting

## 2022-05-19 ENCOUNTER — Encounter (HOSPITAL_COMMUNITY): Payer: Self-pay | Admitting: Internal Medicine

## 2022-05-26 NOTE — Progress Notes (Signed)
Cardiology Clinic Note   Patient Name: Patrick [name] Date of Encounter: 05/29/2022  Primary Care Provider:  Lauro Regulus, MD Primary Cardiologist:  Nicki Guadalajara, MD  Patient Profile    Patrick [name] 66 year old male presents to the clinic today for follow-up evaluation of his coronary artery disease and complete  heart block status post [facility] 05/16/2022.  Past Medical History    Past Medical History:  Diagnosis Date   CAD in native artery    a. anterolateral STEMI 07/2017 s/p DES to prox-mid LAD, EF 55%.   GERD (gastroesophageal reflux disease)    Hyperlipidemia    Hypertension    Overactive bladder    Renal calculi    Syncope    Uncontrolled diabetes mellitus    Past Surgical History:  Procedure Laterality Date   CORONARY/GRAFT ACUTE MI REVASCULARIZATION N/A 08/04/2017   Procedure: Coronary/Graft Acute MI Revascularization;  Surgeon: Lennette Bihari, MD;  Location: [facility] INVASIVE CV LAB;  Service: Cardiovascular;  Laterality: N/A;   LEFT HEART CATH AND CORONARY ANGIOGRAPHY N/A 08/04/2017   Procedure: LEFT HEART CATH AND CORONARY ANGIOGRAPHY;  Surgeon: Lennette Bihari, MD;  Location: [facility] INVASIVE CV LAB;  Service: Cardiovascular;  Laterality: N/A;   LEFT HEART CATH AND CORONARY ANGIOGRAPHY N/A 03/21/2018   Procedure: LEFT HEART CATH AND CORONARY ANGIOGRAPHY;  Surgeon: Lennette Bihari, MD;  Location: [facility] INVASIVE CV LAB;  Service: Cardiovascular;  Laterality: N/A;   PACEMAKER IMPLANT N/A 05/17/2022   Procedure: PACEMAKER IMPLANT;  Surgeon: Marinus Maw, MD;  Location: [facility] INVASIVE CV LAB;  Service: Cardiovascular;  Laterality: N/A;   PROSTATE SURGERY     TEMPORARY PACEMAKER N/A 07/14/2021   Procedure: TEMPORARY PACEMAKER;  Surgeon: Marykay Lex, MD;  Location[facility] INVASIVE CV LAB;  Service: Cardiovascular;  Laterality: N/A;    Allergies  No Known Allergies  History of Present Illness    Patrick [name] has a PMH of coronary artery disease, type 2 diabetes, 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  His echocardiogram 05/16/2022 showed an LVEF of 55-60%, intermediate diastolic parameters, mildly enlarged right ventricle, mildly dilated left atria, trivial mitral valve regurgitation and no other significant valvular abnormalities.  He had a STEMI in 2018 and received PCI with DES to his proximal-mid LAD.  His ejection fraction was normal at that time.  His PMH also includes hyperlipidemia and syncope.  He previously had a temporary pacemaker wire placed 7/22 for high degree AV block.  His temporary pacemaker wire was able to be removed after a washout of metoprolol.  On 05/15/2022 he noted an alert on his phone which showed his heart rate at 30 bpm for a prolonged period.  He was symptomatic with dizziness and reported generally feeling bad.  He had to sit down several times.  He was given atropine in the emergency room and did not note improvement.  His EKG showed 2-1 AV block with a heart rate of 51 and chronic RBBB.  He was not noted to have any ST changes.  He was not on any rate lowering medications.  His creatinine was elevated at 1.73.  He was instructed to hold his Plavix until 05/22/2022.  He tolerated PPM placement well without complications.  He was instructed to follow-up with cardiology in 2 weeks.  He presents to the clinic today for follow-up evaluation and states he has been increasing his physical activity.  He reports that last night he went to a concert at [facility].  He did note some increased chest pressure with hiking up a steep incline and carrying a chair.  We reviewed his pacemaker insertion, device interrogation, and recent episode of chest discomfort that happened 1 week after insertion.  He reported sensation of heartburn at 1-2 AM after being out for dinner.  His recent device interrogation showed an episode of SVT that happened the day after his PPM insertion.  He also reports a sensation of light dizziness/fogginess which has been  evaluated by neurology and is felt to be symptoms of long COVID.  His blood pressure is well controlled at 132/60.  We reviewed the importance of good blood pressure control and low-sodium diet.  I will give him the salty 6 diet sheet, have him increase his physical activity as tolerated, and plan follow-up for 3 to 4 months.  Today he denies chest pain, shortness of breath, lower extremity edema, fatigue, palpitations, melena, hematuria, hemoptysis, diaphoresis, weakness, presyncope, syncope, orthopnea, and PND.   Home Medications    Prior to Admission medications   Medication Sig Start Date End Date Taking? Authorizing Provider  acetaminophen (TYLENOL) 325 MG tablet Take 2 tablets (650 mg total) by mouth every 4 (four) hours as needed for headache or mild pain. 05/17/22   Leone BrandIngold, Laura R, NP  amLODipine (NORVASC) 10 MG tablet TAKE 1 TABLET(10 MG) BY MOUTH DAILY Patient taking differently: Take 10 mg by mouth daily. 03/27/22   Hilty, Lisette AbuKenneth C, MD  aspirin (ASPIRIN LOW DOSE) 81 MG EC tablet Take 1 tablet (81 mg total) by mouth daily. 11/01/18   Lennette BihariKelly, Thomas A, MD  clopidogrel (PLAVIX) 75 MG tablet Take 1 tablet (75 mg total) by mouth daily. 05/22/22   Leone BrandIngold, Laura R, NP  Coenzyme Q10 200 MG capsule Take 200 mg by mouth daily.    [provider]  Glucosamine-Chondroit-Vit C-Mn (GLUCOSAMINE 1500 COMPLEX PO) Take 1,500 mg by mouth daily.    [provider]  isosorbide mononitrate (IMDUR) 60 MG 24 hr tablet Take 60 mg by mouth daily.    [provider]  losartan-hydrochlorothiazide (HYZAAR) 100-12.5 MG tablet Take 1 tablet by mouth daily. 07/23/21 07/23/22  [provider]  Magnesium 250 MG TABS Take 250 mg by mouth daily.    [provider]  metFORMIN (GLUCOPHAGE-XR) 500 MG 24 hr tablet Take 1,000 mg by mouth 2 (two) times daily. 02/25/21   [provider]  Multiple Vitamins-Minerals (ABC PLUS SENIOR PO) Take 1 tablet by mouth daily.    [provider]  MYRBETRIQ 25 MG TB24 tablet TAKE 1 TABLET(25 MG) BY MOUTH DAILY Patient taking differently: Take 25 mg by mouth every evening. 12/25/21   Michiel CowboyMcGowan, Shannon A, PA-C  nitroGLYCERIN (NITROSTAT) 0.4 MG SL tablet Place 1 tablet (0.4 mg total) under the tongue every 5 (five) minutes x 3 doses as needed for chest pain. 07/25/21   Lennette BihariKelly, Thomas A, MD  nystatin cream (MYCOSTATIN) Apply 1 application. topically daily as needed for dry skin. 11/12/21   [provider]  Omega-3 Fatty Acids (FISH OIL OMEGA-3 PO) Take 2 capsules by mouth 2 (two) times daily.     [provider]  pantoprazole (PROTONIX) 40 MG tablet Take 40 mg by mouth daily.    [provider]  Potassium 99 MG TABS Take 99 mg by mouth daily.    [provider]  pyridOXINE (VITAMIN B-6) 50 MG tablet Take 50 mg by mouth daily.    [provider]  ranolazine (RANEXA) 1000 MG  SR tablet TAKE 1 TABLET(1000 MG) BY MOUTH TWICE DAILY Patient taking differently: Take 1,000 mg by mouth 2 (two) times daily. 03/27/22   Lennette Bihari, MD  rosuvastatin (CRESTOR) 20 MG tablet Take 20 mg by mouth daily.    [provider]  SEMAGLUTIDE PO Take 1 tablet by mouth daily.    [provider]  tamsulosin (FLOMAX) 0.4 MG CAPS capsule Take 2 capsules (0.8 mg total) by mouth daily. Patient taking differently: Take 0.8 mg by mouth every evening. 10/21/21   Michiel Cowboy A, PA-C  triamcinolone cream (KENALOG) 0.1 % Apply 1 application. topically daily as needed (rash). 11/12/21   [provider]  vitamin C (ASCORBIC ACID) 500 MG tablet Take 500 mg by mouth daily.    [provider]    Family History    Family History  Problem Relation Age of Onset   Dementia Mother    Heart disease Father    Heart attack Father    He indicated that his mother is deceased. He indicated that his father is deceased.  Social History    Social History   Socioeconomic History   Marital  status: Married    Spouse name: Not on file   Number of children: 3   Years of education: GED   Highest education level: Not on file  Occupational History   Occupation: Editor, commissioning  Tobacco Use   Smoking status: Former    Types: Cigarettes   Smokeless tobacco: Never   Tobacco comments:    Quit smoking at age 20.  Substance and Sexual Activity   Alcohol use: No   Drug use: No   Sexual activity: Not on file  Other Topics Concern   Not on file  Social History Narrative   Lives at home with his wife.   Right-handed.   Caffeine use: 3.5 cups per day.   Social Determinants of Health   Financial Resource Strain: Not on file  Food Insecurity: Not on file  Transportation Needs: Not on file  Physical Activity: Not on file  Stress: Not on file  Social Connections: Not on file  Intimate Partner Violence: Not on file     Review of Systems    General:  No chills, fever, night sweats or weight changes.  Cardiovascular:  No chest pain, dyspnea on exertion, edema, orthopnea, palpitations, paroxysmal nocturnal dyspnea. Dermatological: No rash, lesions/masses Respiratory: No cough, dyspnea Urologic: No hematuria, dysuria Abdominal:   No nausea, vomiting, diarrhea, bright red blood per rectum, melena, or hematemesis Neurologic:  No visual changes, wkns, changes in mental status. All other systems reviewed and are otherwise negative except as noted above.  Physical Exam    VS:  BP 132/60 (BP Location: Left Arm, Patient Position: Sitting, Cuff Size: Normal)   Pulse 80   Ht 6' (1.829 m)   Wt 226 lb (102.5 kg)   BMI 30.65 kg/m  , BMI Body mass index is 30.65 kg/m. GEN: Well nourished, well developed, in no acute distress. HEENT: normal. Neck: Supple, no JVD, carotid bruits, or masses. Cardiac: RRR, no murmurs, rubs, or gallops. No clubbing, cyanosis, edema.  Radials/DP/PT 2+ and equal bilaterally.  Respiratory:  Respirations regular and unlabored, clear to auscultation  bilaterally. GI: Soft, nontender, nondistended, BS + x 4. MS: no deformity or atrophy. Skin: warm and dry, no rash. Neuro:  Strength and sensation are intact. Psych: Normal affect.  Accessory Clinical Findings    Recent Labs: 05/15/2022: ALT 40; TSH 0.732 05/16/2022: BUN  20; Creatinine, Ser 1.30; Hemoglobin 12.9; Platelets 169; Potassium 3.6; Sodium 137   Recent Lipid Panel    Component Value Date/Time   CHOL 79 07/14/2021 1159   TRIG 89 07/14/2021 1159   HDL 27 (L) 07/14/2021 1159   CHOLHDL 2.9 07/14/2021 1159   VLDL 18 07/14/2021 1159   LDLCALC 34 07/14/2021 1159    ECG personally reviewed by me today-none today.  Echocardiogram 05/16/2022  IMPRESSIONS     1. Left ventricular ejection fraction, by estimation, is 55 to 60%. The  left ventricle has normal function. The left ventricle has no regional  wall motion abnormalities. Left ventricular diastolic parameters are  indeterminate.   2. Right ventricular systolic function is normal. The right ventricular  size is mildly enlarged. There is normal pulmonary artery systolic  pressure.   3. Left atrial size was mildly dilated.   4. The mitral valve is normal in structure. Trivial mitral valve  regurgitation. No evidence of mitral stenosis.   5. The aortic valve is grossly normal. Aortic valve regurgitation is not  visualized. No aortic stenosis is present.   6. The inferior vena cava is normal in size with greater than 50%  respiratory variability, suggesting right atrial pressure of 3 mmHg.   Comparison(s): No significant change from prior study.   Assessment & Plan   1.  Symptomatic bradycardia-status post PPM 05/17/2022.   Pacemaker site healing well no signs of infection.  Did note 1 episode of atypical chest discomfort that he noticed at 1-2 AM which he felt was heartburn.  His pacemaker was interrogated today and showed an episode of SVT the day after his pacemaker was inserted. Heart healthy low-sodium  diet Increase physical activity as tolerated Follows with EP  Coronary artery disease-no chest discomfort today.  Status post STEMI 2019 with PCI and DES to his mid-proximal LAD. Continue amlodipine, aspirin, co-Q10, Imdur, losartan, HCTZ, nitroglycerin, rosuvastatin Heart healthy low-sodium diet-salty 6 given Increase physical activity as tolerated  Hyperlipidemia-07/14/2021: Cholesterol 79; HDL 27; LDL Cholesterol 34; Triglycerides 89; VLDL 18 Continue co-Q10, aspirin, rosuvastatin Heart healthy low-sodium high-fiber diet Increase physical activity as tolerated  Syncope-does note dizziness, dizziness sensation.  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  Continues to have mild symptoms that dissipate throughout the day.   Maintain p.o. hydration Heart healthy low-sodium diet-salty 6 given Increase physical activity as tolerated  Disposition: Follow-up with Dr. Tresa Endo in 3-4 months.   Thomasene Ripple. Jevaeh Shams NP-C    05/29/2022, 4:14 PM [facility] [address] Office [phone] Fax [phone]  Notice: This dictation was prepared with Dragon dictation along with smaller phrase technology. Any transcriptional errors that result from this process are unintentional and may not be corrected upon review.  I spent 16 minutes examining this patient, reviewing medications, and using patient centered shared decision making involving her cardiac care.  Prior to her visit I spent greater than 20 minutes reviewing her past medical history,  medications, and prior cardiac tests.

## 2022-05-29 ENCOUNTER — Ambulatory Visit (INDEPENDENT_AMBULATORY_CARE_PROVIDER_SITE_OTHER): Payer: Medicare Other | Admitting: Student

## 2022-05-29 ENCOUNTER — Ambulatory Visit (INDEPENDENT_AMBULATORY_CARE_PROVIDER_SITE_OTHER): Payer: Medicare Other | Admitting: General Practice

## 2022-05-29 ENCOUNTER — Encounter: Payer: Self-pay | Admitting: General Practice

## 2022-05-29 VITALS — BP 132/60 | HR 80 | Ht 72.0 in | Wt 226.0 lb

## 2022-05-29 DIAGNOSIS — I4439 Other atrioventricular block: Secondary | ICD-10-CM | POA: Diagnosis not present

## 2022-05-29 DIAGNOSIS — Z950 Presence of cardiac pacemaker: Secondary | ICD-10-CM

## 2022-05-29 DIAGNOSIS — R55 Syncope and collapse: Secondary | ICD-10-CM | POA: Diagnosis not present

## 2022-05-29 DIAGNOSIS — E785 Hyperlipidemia, unspecified: Secondary | ICD-10-CM

## 2022-05-29 DIAGNOSIS — R001 Bradycardia, unspecified: Secondary | ICD-10-CM

## 2022-05-29 LAB — CUP PACEART INCLINIC DEVICE CHECK
Date Time Interrogation Session: 20230609123157
Implantable Lead Implant Date: 20230528
Implantable Lead Implant Date: 20230528
Implantable Lead Location: 753859
Implantable Lead Location: 753860
Implantable Lead Model: 377169
Implantable Lead Model: 377171
Implantable Lead Serial Number: 8000368134
Implantable Lead Serial Number: 8000749513
Implantable Pulse Generator Implant Date: 20230528
Pulse Gen Model: 407145
Pulse Gen Serial Number: 70380012

## 2022-05-29 NOTE — Progress Notes (Signed)
Wound check appointment. Steri-strips removed. Wound without redness or edema. Incision edges approximated, wound well healed. Normal device function. Thresholds, sensing, and impedances consistent with implant measurements. Device programmed at 3.5V/auto capture programmed on for extra safety margin until 3 month visit. Histogram distribution appropriate for patient and level of activity. 1 short SVT vs AFL. Asymptomatic, occurred day after implant so likely not clinically significant. Patient educated about wound care, arm mobility, lifting restrictions. ROV in 3 months with implanting physician.  Error obtaining programmer data. See attached.

## 2022-05-29 NOTE — Patient Instructions (Signed)
Medication Instructions:  The current medical regimen is effective;  continue present plan and medications as directed. Please refer to the Current Medication list given to you today.   *If you need a refill on your cardiac medications before your next appointment, please call your pharmacy*  Lab Work:   Testing/Procedures:  NONE    NONE  If you have labs (blood work) drawn today and your tests are completely normal, you will receive your results only by: MyChart Message (if you have MyChart) OR  A paper copy in the mail If you have any lab test that is abnormal or we need to change your treatment, we will call you to review the results.  Special Instructions PLEASE READ AND FOLLOW SALTY 6-ATTACHED-1,800mg  daily  PLEASE INCREASE PHYSICAL ACTIVITY AS TOLERATED   Follow-Up: Your next appointment:  3-4 month(s) In Person with Nicki Guadalajara, MD ONLY  At [facility], you and your health needs are our priority.  As part of our continuing mission to provide you with exceptional heart care, we have created designated Provider Care Teams.  These Care Teams include your primary Cardiologist (physician) and Advanced Practice Providers (APPs -  Physician Assistants and Nurse Practitioners) who all work together to provide you with the care you need, when you need it.   Important Information About Sugar               6 SALTY THINGS TO AVOID     1,800MG  DAILY

## 2022-07-01 ENCOUNTER — Other Ambulatory Visit: Payer: Self-pay | Admitting: Cardiovascular Disease

## 2022-07-15 ENCOUNTER — Ambulatory Visit: Payer: Medicare Other | Admitting: Neurology

## 2022-07-30 ENCOUNTER — Ambulatory Visit (INDEPENDENT_AMBULATORY_CARE_PROVIDER_SITE_OTHER): Payer: Medicare Other | Admitting: Neurology

## 2022-07-30 ENCOUNTER — Encounter: Payer: Self-pay | Admitting: Neurology

## 2022-07-30 VITALS — BP 122/71 | HR 75 | Ht 72.0 in | Wt 228.0 lb

## 2022-07-30 DIAGNOSIS — R404 Transient alteration of awareness: Secondary | ICD-10-CM | POA: Diagnosis not present

## 2022-07-30 DIAGNOSIS — R4189 Other symptoms and signs involving cognitive functions and awareness: Secondary | ICD-10-CM

## 2022-07-30 NOTE — Progress Notes (Signed)
[facility]  PATIENT: Patrick [name] DOB: 02-07-1956  REFERRING CLINICIAN: Kirk Ruths, MD HISTORY FROM: Patient  REASON FOR VISIT: Recurrent syncope/near syncope follow up   HISTORICAL  CHIEF COMPLAINT:  Chief Complaint  Patient presents with   Follow-up    Rm 15. Alone. Pt reports more incidents of dizziness since getting a pacemaker. Continue to have brain fog.    INTERVAL HISTORY 07/30/2022:  Patient presents today for follow up. Since last visit, he reports doing well in terms of brain fog and dizziness but in early Patrick he had another episode of dizziness and his watch showing a heart rate of 30. 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 Since the placement of the pacemaker, he reports worsening brain fog, difficulty concentrating and word finding diffuclty. 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    INTERVAL HISTORY 01/14/2022:  Patient presented today for follow-up, since last visit in October he has been taking meclizine.  Initially he reported the meclizine helped but later was not helpful, he increase it to 25 mg up to 3 times daily but no relief.  He described the dizziness as difficulty with focusing, difficulty with concentration, sick state that it feels like there is cotton instead of brain matter and there is impairment of cognitive ability, likely brain fog.  Actually he denies any trouble with balance he he is not unsteady.  Denies any falls.  Stated last year in March she was diagnosed with COVID, mild infection.  Denies any recent TBI. He has not followed up with ENT.   HISTORY OF PRESENT ILLNESS:  This is a 66 year old gentleman with past medical history of hypertension, hyperlipidemia, CKD, diabetes, previous heart attack who is presenting with complaint of recurrent syncope and dizziness.  Patient said the first episode started in April  2022 when he has a episode of dizziness, feeling faint and fuzzy feeling in his head.  He went to the ED was observed and was told it was related to dehydration and discharged home.  Patient had another event on June 17 where he fell and hit his head.  On July 25 he had another event, fell hit his head, was taken to the ED via ambulance and in the ED was noted to have a heart rate of 30 bpm.  He was given the diagnosis of heart block and had a temporary pacemaker.  He was admitted to the cardiology service and his metoprolol was discontinued and patient have a heart monitor placed.  After discontinuation of the metoprolol his heart rate is normalized.  He was told that the bradycardia was secondary to his metoprolol.  Since being discharged from the hospital he has additional episodes of dizziness but no fainting. He describes the dizziness as lightheaded and fuzzy feeling in his head.   He did follow-up with cardiology again and was told that his recurrent dizziness did not sound cardiac in nature given normal vitals and his EKG strip on his watch was normal.  He was recommended to follow-up with neurology and ENT.   He does report hearing loss and tinnitus but said this is chronic.  OTHER MEDICAL CONDITIONS: HTN, Heart attack, DM, HLD   REVIEW OF SYSTEMS: Full 14 system review of systems performed and negative with exception of: as noted   ALLERGIES: No Known Allergies  HOME MEDICATIONS: Outpatient Medications Prior to Visit  Medication Sig  Dispense Refill   acetaminophen (TYLENOL) 325 MG tablet Take 2 tablets (650 mg total) by mouth every 4 (four) hours as needed for headache or mild pain.     amLODipine (NORVASC) 10 MG tablet TAKE 1 TABLET(10 MG) BY MOUTH DAILY (Patient taking differently: Take 5 mg by mouth daily.) 90 tablet 1   aspirin (ASPIRIN LOW DOSE) 81 MG EC tablet Take 1 tablet (81 mg total) by mouth daily. 90 tablet 0   clopidogrel (PLAVIX) 75 MG tablet Take 1 tablet (75 mg total) by  mouth daily. 90 tablet 3   Coenzyme Q10 200 MG capsule Take 200 mg by mouth daily.     Glucosamine-Chondroit-Vit C-Mn (GLUCOSAMINE 1500 COMPLEX PO) Take 1,500 mg by mouth daily.     isosorbide mononitrate (IMDUR) 60 MG 24 hr tablet Take 60 mg by mouth daily.     Magnesium 250 MG TABS Take 250 mg by mouth daily.     metFORMIN (GLUCOPHAGE-XR) 500 MG 24 hr tablet Take 1,000 mg by mouth 2 (two) times daily.     Multiple Vitamins-Minerals (ABC PLUS SENIOR PO) Take 1 tablet by mouth daily.     MYRBETRIQ 25 MG TB24 tablet TAKE 1 TABLET(25 MG) BY MOUTH DAILY (Patient taking differently: Take 25 mg by mouth every evening.) 30 tablet 11   nitroGLYCERIN (NITROSTAT) 0.4 MG SL tablet Place 1 tablet (0.4 mg total) under the tongue every 5 (five) minutes x 3 doses as needed for chest pain. 25 tablet 3   nystatin cream (MYCOSTATIN) Apply 1 application. topically daily as needed for dry skin.     Omega-3 Fatty Acids (FISH OIL OMEGA-3 PO) Take 2 capsules by mouth 2 (two) times daily.      pantoprazole (PROTONIX) 40 MG tablet Take 40 mg by mouth daily.     Potassium 99 MG TABS Take 99 mg by mouth daily.     pyridOXINE (VITAMIN B-6) 50 MG tablet Take 50 mg by mouth daily.     ranolazine (RANEXA) 1000 MG SR tablet Take 1 tablet (1,000 mg total) by mouth 2 (two) times daily. 180 tablet 0   rosuvastatin (CRESTOR) 20 MG tablet Take 20 mg by mouth daily.     SEMAGLUTIDE PO Take 1 tablet by mouth daily.     tamsulosin (FLOMAX) 0.4 MG CAPS capsule Take 2 capsules (0.8 mg total) by mouth daily. (Patient taking differently: Take 0.8 mg by mouth every evening.) 180 capsule 3   triamcinolone cream (KENALOG) 0.1 % Apply 1 application. topically daily as needed (rash).     vitamin C (ASCORBIC ACID) 500 MG tablet Take 500 mg by mouth daily.     No facility-administered medications prior to visit.    PAST MEDICAL HISTORY: Past Medical History:  Diagnosis Date   CAD in native artery    a. anterolateral STEMI 07/2017 s/p DES  to prox-mid LAD, EF 55%.   GERD (gastroesophageal reflux disease)    Hyperlipidemia    Hypertension    Overactive bladder    Renal calculi    Syncope    Uncontrolled diabetes mellitus     PAST SURGICAL HISTORY: Past Surgical History:  Procedure Laterality Date   CORONARY/GRAFT ACUTE MI REVASCULARIZATION N/A 08/04/2017   Procedure: Coronary/Graft Acute MI Revascularization;  Surgeon: Troy Sine, MD;  Location: [facility];  Service: Cardiovascular;  Laterality: N/A;   LEFT HEART CATH AND CORONARY ANGIOGRAPHY N/A 08/04/2017   Procedure: LEFT HEART CATH AND CORONARY ANGIOGRAPHY;  Surgeon: Troy Sine, MD;  Location: [facility];  Service: Cardiovascular;  Laterality: N/A;   LEFT HEART CATH AND CORONARY ANGIOGRAPHY N/A 03/21/2018   Procedure: LEFT HEART CATH AND CORONARY ANGIOGRAPHY;  Surgeon: Lennette Bihari, MD;  Location: [facility];  Service: Cardiovascular;  Laterality: N/A;   PACEMAKER IMPLANT N/A 05/17/2022   Procedure: PACEMAKER IMPLANT;  Surgeon: Marinus Maw, MD;  Location: [facility];  Service: Cardiovascular;  Laterality: N/A;   PROSTATE SURGERY     TEMPORARY PACEMAKER N/A 07/14/2021   Procedure: TEMPORARY PACEMAKER;  Surgeon: Marykay Lex, MD;  [facility]: [facility];  Service: Cardiovascular;  Laterality: N/A;    FAMILY HISTORY: Family History  Problem Relation Age of Onset   Dementia Mother    Heart disease Father    Heart attack Father     SOCIAL HISTORY: Social History   Socioeconomic History   Marital status: Married    Spouse name: Not on file   Number of children: 3   Years of education: GED   Highest education level: Not on file  Occupational History   Occupation: Editor, commissioning  Tobacco Use   Smoking status: Former    Types: Cigarettes   Smokeless tobacco: Never   Tobacco comments:    Quit smoking at age 44.  Substance and Sexual Activity   Alcohol use: No   Drug use: No   Sexual activity: Not on  file  Other Topics Concern   Not on file  Social History Narrative   Lives at home with his wife.   Right-handed.   Caffeine use: 3.5 cups per day.   Social Determinants of Health   Financial Resource Strain: Not on file  Food Insecurity: Not on file  Transportation Needs: Not on file  Physical Activity: Not on file  Stress: Not on file  Social Connections: Not on file  Intimate Partner Violence: Not on file    PHYSICAL EXAM  GENERAL EXAM/CONSTITUTIONAL: Vitals:  Vitals:   07/30/22 1007  BP: 122/71  Pulse: 75  Weight: 228 lb (103.4 kg)  Height: 6' (1.829 m)    Body mass index is 30.92 kg/m. Wt Readings from Last 3 Encounters:  07/30/22 228 lb (103.4 kg)  05/29/22 226 lb (102.5 kg)  05/15/22 219 lb 14.4 oz (99.7 kg)   Patient is in no distress; well developed, nourished and groomed; neck is supple  EYES: Pupils round and reactive to light, Visual fields full to confrontation, Extraocular movements intacts,   MUSCULOSKELETAL: Gait, strength, tone, movements noted in Neurologic exam below  NEUROLOGIC: MENTAL STATUS:      No data to display            07/30/2022   10:37 AM  Montreal Cognitive Assessment   Visuospatial/ Executive (0/5) 5  Naming (0/3) 3  Attention: Read list of digits (0/2) 2  Attention: Read list of letters (0/1) 1  Attention: Serial 7 subtraction starting at 100 (0/3) 3  Language: Repeat phrase (0/2) 2  Language : Fluency (0/1) 0  Abstraction (0/2) 2  Delayed Recall (0/5) 4  Orientation (0/6) 5  Total 27  Adjusted Score (based on education) 27    CRANIAL NERVE: 2nd, 3rd, 4th, 6th - pupils equal and reactive to light, visual fields full to confrontation, extraocular muscles intact, no nystagmus 5th - facial sensation symmetric 7th - facial strength symmetric 8th - hearing intact 9th - palate elevates symmetrically, uvula midline 11th - shoulder shrug symmetric 12th - tongue protrusion midline  MOTOR:  normal bulk and tone,  full strength in the BUE, BLE  SENSORY:  normal and symmetric to light touch  COORDINATION:  finger-nose-finger, fine finger movements normal  REFLEXES:  deep tendon reflexes present and symmetric  GAIT/STATION:  normal   DIAGNOSTIC DATA (LABS, IMAGING, TESTING) - I reviewed patient records, labs, notes, testing and imaging myself where available.  Lab Results  Component Value Date   WBC 6.3 05/16/2022   HGB 12.9 (L) 05/16/2022   HCT 35.5 (L) 05/16/2022   MCV 88.5 05/16/2022   PLT 169 05/16/2022      Component Value Date/Time   NA 137 05/16/2022 0324   NA 137 09/01/2021 1628   NA 140 05/18/2013 0959   K 3.6 05/16/2022 0324   K 4.0 05/18/2013 0959   CL 105 05/16/2022 0324   CL 104 05/18/2013 0959   CO2 26 05/16/2022 0324   CO2 29 05/18/2013 0959   GLUCOSE 104 (H) 05/16/2022 0324   GLUCOSE 142 (H) 05/18/2013 0959   BUN 20 05/16/2022 0324   BUN 23 09/01/2021 1628   BUN 27 (H) 05/18/2013 0959   CREATININE 1.30 (H) 05/16/2022 0324   CREATININE 1.03 05/18/2013 0959   CALCIUM 8.8 (L) 05/16/2022 0324   CALCIUM 9.3 05/18/2013 0959   PROT 6.3 (L) 05/15/2022 1601   ALBUMIN 3.9 05/15/2022 1601   AST 40 05/15/2022 1601   ALT 40 05/15/2022 1601   ALKPHOS 35 (L) 05/15/2022 1601   BILITOT 1.0 05/15/2022 1601   GFRNONAA 60 (L) 05/16/2022 0324   GFRNONAA >60 05/18/2013 0959   GFRAA 82 03/16/2018 1155   GFRAA >60 05/18/2013 0959   Lab Results  Component Value Date   CHOL 79 07/14/2021   HDL 27 (L) 07/14/2021   LDLCALC 34 07/14/2021   TRIG 89 07/14/2021   CHOLHDL 2.9 07/14/2021   Lab Results  Component Value Date   HGBA1C 5.9 (H) 07/14/2021   No results found for: "VITAMINB12" Lab Results  Component Value Date   TSH 0.732 05/15/2022    Head CT 06/06/21 1. No acute intracranial abnormality. 2. Stable age related atrophy and minor chronic small vessel ischemia.   Cervical spine CT 06/06/2021 1. No acute fracture or subluxation of the cervical spine. 2.  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    ASSESSMENT AND PLAN  66 y.o. year old male with past medical history of previous heart attack in 2018, hypertension, hyperlipidemia, CKD who is presenting for follow up for syncope and dizziness and brain fog. He was recently diagnosed with symptomatic bradycardia and had a pacemaker place, since then no additional dizziness but reports worsening brain fog. Today his MOCA was normal at 27. He also reports some altered sensorium associated with the brain fog. I will obtain a routine EEG and sent patient for cognitive therapy. I have advised patient to increase his fluid intake, to increase exercise, to follow-up with his primary care doctor and I will see him in 1 year for follow up or sooner if worse     1. Brain fog   2. Altered sensorium      Patient Instructions  Continue current medications  Routine EEG  Referral to speech therapy for brain fog  Return in a year or sooner if worse    Orders Placed This Encounter  Procedures   EEG adult     No orders of the defined types were placed in this encounter.   Return in about 1 year (around 07/31/2023).  I  have spent a total of 50 minutes dedicated to this patient today, preparing to see patient, performing a medically appropriate examination and evaluation, ordering tests and/or medications and procedures, and counseling and educating the patient/family/caregiver; independently interpreting result and communicating results to the family/patient/caregiver; and documenting clinical information in the electronic medical record.   Windell Norfolk, MD 07/30/2022, 11:06 AM  [facility] [address] [place], [place] [address] [phone]

## 2022-07-30 NOTE — Patient Instructions (Signed)
Continue current medications  Routine EEG  Referral to speech therapy for brain fog  Return in a year or sooner if worse

## 2022-08-06 ENCOUNTER — Ambulatory Visit (INDEPENDENT_AMBULATORY_CARE_PROVIDER_SITE_OTHER): Payer: Medicare Other | Admitting: Neurology

## 2022-08-06 DIAGNOSIS — R404 Transient alteration of awareness: Secondary | ICD-10-CM

## 2022-08-06 DIAGNOSIS — R4182 Altered mental status, unspecified: Secondary | ICD-10-CM

## 2022-08-06 DIAGNOSIS — R4189 Other symptoms and signs involving cognitive functions and awareness: Secondary | ICD-10-CM

## 2022-08-06 NOTE — Procedures (Signed)
    History:  66 year old man with brain fog and dizziness   EEG classification:  Awake and asleep  Description of the recording: The background rhythms of this recording consists of a fairly well modulated medium amplitude background activity of 9 Hz. As the record progresses, the patient initially is in the waking state, but appears to enter the early stage II sleep during the recording, with rudimentary sleep spindles and vertex sharp wave activity seen. During the wakeful state, photic stimulation is performed, and no abnormal responses were seen. Hyperventilation was also performed, no abnormal response seen. No epileptiform discharges seen during this recording. There was no focal slowing. EKG monitor shows no evidence of cardiac rhythm abnormalities with a heart rate of 78.  Abnormality: None   Impression: This is a normal EEG recording in the waking and sleeping state. No evidence interictal epileptiform discharges were seen at any time during the recording.  A normal EEG does not exclude a diagnosis of epilepsy.    Windell Norfolk, MD [facility]

## 2022-08-19 ENCOUNTER — Ambulatory Visit (INDEPENDENT_AMBULATORY_CARE_PROVIDER_SITE_OTHER): Payer: Medicare Other

## 2022-08-19 DIAGNOSIS — Z950 Presence of cardiac pacemaker: Secondary | ICD-10-CM | POA: Diagnosis not present

## 2022-08-19 LAB — CUP PACEART REMOTE DEVICE CHECK
Battery Remaining Percentage: 100 %
Brady Statistic RA Percent Paced: 1 %
Brady Statistic RV Percent Paced: 14 %
Date Time Interrogation Session: 20230830082910
Implantable Lead Implant Date: 20230528
Implantable Lead Implant Date: 20230528
Implantable Lead Location: 753859
Implantable Lead Location: 753860
Implantable Lead Model: 377169
Implantable Lead Model: 377171
Implantable Lead Serial Number: 8000368134
Implantable Lead Serial Number: 8000749513
Implantable Pulse Generator Implant Date: 20230528
Lead Channel Impedance Value: 527 Ohm
Lead Channel Impedance Value: 546 Ohm
Lead Channel Pacing Threshold Amplitude: 0.8 V
Lead Channel Pacing Threshold Amplitude: 1.2 V
Lead Channel Pacing Threshold Pulse Width: 0.4 ms
Lead Channel Pacing Threshold Pulse Width: 0.4 ms
Lead Channel Sensing Intrinsic Amplitude: 4.4 mV
Lead Channel Sensing Intrinsic Amplitude: 6.8 mV
Lead Channel Setting Pacing Amplitude: 3 V
Lead Channel Setting Pacing Amplitude: 3 V
Lead Channel Setting Pacing Pulse Width: 0.4 ms
Pulse Gen Model: 407145
Pulse Gen Serial Number: 70380012

## 2022-08-25 NOTE — Telephone Encounter (Signed)
Pt scheduled for 08/26/22

## 2022-08-25 NOTE — Progress Notes (Unsigned)
08/26/2022 10:15 PM   Patrick [name] 1956/07/03 [id number]  Referring provider: Kirk Ruths, MD [facility] [place],  [place] [address]  Urological history: 1. Elevated PSA - biopsy 2004 iPSA 6.8 negative - biopsy 2008 iPSA 6.3 negative - PSA Trend PSA 3.87 in 09/2021   Prostate Specific Ag, Serum  Latest Ref Rng 0.0 - 4.0 ng/mL  03/22/2018 3.5   09/26/2019 3.2   10/15/2020 4.1 (H)   12/26/2020 4.5 (H)   05/08/2021 3.1   04/20/2022 3.8     Legend: (H) High  2. BPH with LU TS -~15 years ago TUMT - I PSS 15/2 - Managed with tamsulosin 0.4 mg daily  3. Pelvic floor dysfunction - documented increased EMG activity with all voids from urodynamics in 2018 - underwent PT  4. Nocturia - managed with Myrbetriq 25 mg daily - not interested in sleep study  5. ED -contributing factors of age, CAD, diabetes, HLD, HTN and former smoker -using alternative measures for sexual activity   Flank Pain    HPI: Patrick [name] is a 66 y.o. male who presents today for possible stone.   He states since last Thursday afternoon he has been having an intermittent dull pain in his lower abdomen associated with difficulty urinating.  The pain then became more localized in the left flank.  Patient denies any modifying or aggravating factors.  Patient denies any gross hematuria, dysuria or suprapubic/flank pain.  Patient denies any fevers, chills, nausea or vomiting.    UA 11-30 WBCs, 11-30 RBCs and few bacteria noted  KUB (08/2022) - 8 mm calcification at the left UVJ  CT Renal stone study (08/2022) - 8 mm left UVJ stone w/ mild hydro - results given by phone with wife as well.   PMH: Past Medical History:  Diagnosis Date   CAD in native artery    a. anterolateral STEMI 07/2017 s/p DES to prox-mid LAD, EF 55%.   GERD (gastroesophageal reflux disease)    Hyperlipidemia    Hypertension    Overactive bladder    Renal calculi    Syncope    Uncontrolled  diabetes mellitus     Surgical History: Past Surgical History:  Procedure Laterality Date   CORONARY/GRAFT ACUTE MI REVASCULARIZATION N/A 08/04/2017   Procedure: Coronary/Graft Acute MI Revascularization;  Surgeon: Troy Sine, MD;  Location: [place] [facility];  Service: Cardiovascular;  Laterality: N/A;   LEFT HEART CATH AND CORONARY ANGIOGRAPHY N/A 08/04/2017   Procedure: LEFT HEART CATH AND CORONARY ANGIOGRAPHY;  Surgeon: Troy Sine, MD;  Location: [place] [facility];  Service: Cardiovascular;  Laterality: N/A;   LEFT HEART CATH AND CORONARY ANGIOGRAPHY N/A 03/21/2018   Procedure: LEFT HEART CATH AND CORONARY ANGIOGRAPHY;  Surgeon: Troy Sine, MD;  Location: [place] [facility];  Service: Cardiovascular;  Laterality: N/A;   PACEMAKER IMPLANT N/A 05/17/2022   Procedure: PACEMAKER IMPLANT;  Surgeon: Evans Lance, MD;  Location: [place] [facility];  Service: Cardiovascular;  Laterality: N/A;   PROSTATE SURGERY     TEMPORARY PACEMAKER N/A 07/14/2021   Procedure: TEMPORARY PACEMAKER;  Surgeon: Leonie Man, MD;  Location: [place] [facility];  Service: Cardiovascular;  Laterality: N/A;    Home Medications:  Allergies as of 08/26/2022   No Known Allergies      Medication List        Accurate as of August 26, 2022 10:15 PM. If you have any questions,  ask your nurse or doctor.          STOP taking these medications    acetaminophen 325 MG tablet Commonly known as: TYLENOL Stopped by: Zara Council, PA-C       TAKE these medications    ABC PLUS SENIOR PO Take 1 tablet by mouth daily.   amLODipine 10 MG tablet Commonly known as: NORVASC TAKE 1 TABLET(10 MG) BY MOUTH DAILY What changed: See the new instructions.   ascorbic acid 500 MG tablet Commonly known as: VITAMIN C Take 500 mg by mouth daily.   aspirin EC 81 MG tablet Commonly known as: Aspirin Low Dose Take 1 tablet (81 mg total) by mouth daily.   clopidogrel 75 MG tablet Commonly known as:  PLAVIX Take 1 tablet (75 mg total) by mouth daily.   Coenzyme Q10 200 MG capsule Take 200 mg by mouth daily.   FISH OIL OMEGA-3 PO Take 2 capsules by mouth 2 (two) times daily.   GLUCOSAMINE 1500 COMPLEX PO Take 1,500 mg by mouth daily.   isosorbide mononitrate 60 MG 24 hr tablet Commonly known as: IMDUR Take 60 mg by mouth daily.   Magnesium 250 MG Tabs Take 250 mg by mouth daily.   metFORMIN 500 MG 24 hr tablet Commonly known as: GLUCOPHAGE-XR Take 1,000 mg by mouth 2 (two) times daily.   Myrbetriq 25 MG Tb24 tablet Generic drug: mirabegron ER TAKE 1 TABLET(25 MG) BY MOUTH DAILY What changed: See the new instructions.   nitroGLYCERIN 0.4 MG SL tablet Commonly known as: NITROSTAT Place 1 tablet (0.4 mg total) under the tongue every 5 (five) minutes x 3 doses as needed for chest pain.   nystatin cream Commonly known as: MYCOSTATIN Apply 1 application. topically daily as needed for dry skin.   oxyCODONE-acetaminophen 10-325 MG tablet Commonly known as: Percocet Take 1 tablet by mouth every 4 (four) hours as needed for pain. Started by: Zara Council, PA-C   pantoprazole 40 MG tablet Commonly known as: PROTONIX Take 40 mg by mouth daily.   Potassium 99 MG Tabs Take 99 mg by mouth daily.   pyridOXINE 50 MG tablet Commonly known as: VITAMIN B6 Take 50 mg by mouth daily.   ranolazine 1000 MG SR tablet Commonly known as: RANEXA Take 1 tablet (1,000 mg total) by mouth 2 (two) times daily.   rosuvastatin 20 MG tablet Commonly known as: CRESTOR Take 20 mg by mouth daily.   SEMAGLUTIDE PO Take 1 tablet by mouth daily.   sulfamethoxazole-trimethoprim 800-160 MG tablet Commonly known as: BACTRIM DS Take 1 tablet by mouth every 12 (twelve) hours. Started by: Zara Council, PA-C   tamsulosin 0.4 MG Caps capsule Commonly known as: FLOMAX Take 2 capsules (0.8 mg total) by mouth daily. What changed: when to take this   triamcinolone cream 0.1 % Commonly  known as: KENALOG Apply 1 application. topically daily as needed (rash).        Allergies: No Known Allergies  Family History: Family History  Problem Relation Age of Onset   Dementia Mother    Heart disease Father    Heart attack Father     Social History:  reports that he has quit smoking. His smoking use included cigarettes. He has never used smokeless tobacco. He reports that he does not drink alcohol and does not use drugs.  ROS: For pertinent review of systems please refer to history of present illness  Physical Exam: BP 101/63   Pulse 84   Ht $R'5\' 11"'Pr$  (  1.803 m)   Wt 220 lb (99.8 kg)   BMI 30.68 kg/m   Constitutional:  Well nourished. Alert and oriented, No acute distress. HEENT: [place] AT, moist mucus membranes.  Trachea midline Cardiovascular: No clubbing, cyanosis, or edema. Respiratory: Normal respiratory effort, no increased work of breathing. Neurologic: Grossly intact, no focal deficits, moving all 4 extremities. Psychiatric: Normal mood and affect.   Laboratory Data: WBC (White Blood Cell Count) 4.1 - 10.2 10^3/uL 5.7   RBC (Red Blood Cell Count) 4.69 - 6.13 10^6/uL 4.21 Low    Hemoglobin 14.1 - 18.1 gm/dL 13.3 Low    Hematocrit 40.0 - 52.0 % 38.5 Low    MCV (Mean Corpuscular Volume) 80.0 - 100.0 fl 91.4   MCH (Mean Corpuscular Hemoglobin) 27.0 - 31.2 pg 31.6 High    MCHC (Mean Corpuscular Hemoglobin Concentration) 32.0 - 36.0 gm/dL 34.5   Platelet Count 150 - 450 10^3/uL 194   RDW-CV (Red Cell Distribution Width) 11.6 - 14.8 % 12.6   MPV (Mean Platelet Volume) 9.4 - 12.4 fl 9.5   Neutrophils 1.50 - 7.80 10^3/uL 3.15   Lymphocytes 1.00 - 3.60 10^3/uL 1.90   Monocytes 0.00 - 1.50 10^3/uL 0.50   Eosinophils 0.00 - 0.55 10^3/uL 0.07   Basophils 0.00 - 0.09 10^3/uL 0.03   Neutrophil % 32.0 - 70.0 % 55.6   Lymphocyte % 10.0 - 50.0 % 33.5   Monocyte % 4.0 - 13.0 % 8.8   Eosinophil % 1.0 - 5.0 % 1.2   Basophil% 0.0 - 2.0 % 0.5   Immature Granulocyte % <=0.7 %  0.4   Immature Granulocyte Count <=0.06 10^3/L 0.02   Resulting Agency  [facility]   Specimen Collected: 06/11/22 14:43   Performed by: [facility] Last Resulted: 06/11/22 15:34  Received From: [facility]  Result Received: 07/01/22 10:06   Glucose 70 - 110 mg/dL 158 High    Sodium 136 - 145 mmol/L 138   Potassium 3.6 - 5.1 mmol/L 4.0   Chloride 97 - 109 mmol/L 101   Carbon Dioxide (CO2) 22.0 - 32.0 mmol/L 29.2   Calcium 8.7 - 10.3 mg/dL 9.2   Urea Nitrogen (BUN) 7 - 25 mg/dL 26 High    Creatinine 0.7 - 1.3 mg/dL 1.2   Glomerular Filtration Rate (eGFR), MDRD Estimate >60 mL/min/1.73sq m 60 Low    BUN/Crea Ratio 6.0 - 20.0 21.7 High    Anion Gap w/K 6.0 - 16.0 11.8   Resulting Agency  [facility]   Specimen Collected: 06/11/22 14:43   Performed by: [facility] Last Resulted: 06/11/22 16:47  Received From: [facility]  Result Received: 07/01/22 10:06  I have reviewed the labs.   Pertinent Imaging CLINICAL DATA:  Acute left flank pain   EXAM: CT ABDOMEN AND PELVIS WITHOUT CONTRAST   TECHNIQUE: Multidetector CT imaging of the abdomen and pelvis was performed following the standard protocol without IV contrast.   RADIATION DOSE REDUCTION: This exam was performed according to the departmental dose-optimization program which includes automated exposure control, adjustment of the mA and/or kV according to patient size and/or use of iterative reconstruction technique.   COMPARISON:  11/08/2016   FINDINGS: Lower chest: No acute abnormality.  Coronary artery calcifications.   Hepatobiliary: No solid liver abnormality is seen. Gallstones. Gallbladder wall thickening, or biliary dilatation.   Pancreas: Unremarkable. No pancreatic ductal dilatation or surrounding inflammatory changes.   Spleen: Normal  in size without significant abnormality.   Adrenals/Urinary Tract: Adrenal  glands are unremarkable. There is a 0.8 cm calculus at the left ureterovesicular junction, with mild associated left hydronephrosis and hydroureter (series 2, image 90). No right-sided calculi or hydronephrosis. Symmetric bilateral perinephric fat stranding, unchanged. Bladder is unremarkable.   Stomach/Bowel: Stomach is within normal limits. Appendix appears normal. No evidence of bowel wall thickening, distention, or inflammatory changes. Sigmoid diverticulosis.   Vascular/Lymphatic: Aortic atherosclerosis. No enlarged abdominal or pelvic lymph nodes.   Reproductive: No mass or other significant abnormality.   Other: Small, containing bilateral inguinal hernias.  No ascites.   Musculoskeletal: No acute or significant osseous findings.   IMPRESSION: 1. There is a 0.8 cm calculus at the left ureterovesicular junction, with mild associated left hydronephrosis and hydroureter. 2. No no additional calculi right-sided hydronephrosis. 3. Cholelithiasis without evidence of acute cholecystitis. 4. Sigmoid diverticulosis without evidence of acute diverticulitis. 5. Coronary artery disease.   These results will be called to the ordering clinician or representative by the Radiologist Assistant, and communication documented in the PACS or [employer].   Aortic Atherosclerosis (ICD10-I70.0).     Electronically Signed   By: Delanna Ahmadi M.D.   On: 08/26/2022 16:35  I have independently reviewed the films.  See HPI.    Assessment & Plan:    1. Left flank pain -secondary to left UVJ stone -UA 11-30 WBC's, 11-30 RBC's -CT renal stone study and KUB w/ 8 mm left UVJ stone  2. Left UVJ stone We discussed various treatment options for urolithiasis including observation with or without medical expulsive therapy, shockwave lithotripsy (SWL), ureteroscopy and laser lithotripsy with stent placement.   We discussed that management is based on stone size, location, density, patient  co-morbidities, and patient preference.    Stones <54mm in size have a >80% spontaneous passage rate. Data surrounding the use of tamsulosin for medical expulsive therapy is controversial, but meta analyses suggests it is most efficacious for distal stones between 5-69mm in size. Possible side effects include dizziness/lightheadedness   SWL has a lower stone free rate in a single procedure, but also a lower complication rate compared to ureteroscopy and avoids a stent and associated stent related symptoms. Possible complications include renal hematoma, steinstrasse, and need for additional treatment.   Ureteroscopy with laser lithotripsy and stent placement has a higher stone free rate than SWL in a single procedure, however increased complication rate including possible infection, ureteral injury, bleeding, and stent related morbidity. Common stent related symptoms include dysuria, urgency/frequency, and flank pain.  - he would like to undergo left URS/LL/ureteral stent placement at this time - orders placed - request for cardiac clearance -UA w/pyuria and micro heme -urine culture -continue tamsulosin 0.4 mg twice daily -start Septra DS, twice daily  -given Percocet 10/325, 1 tablets q 4 hours prn for pain, #10 Patient is advised that if they should start to experience pain that is not able to be controlled with pain medication, intractable nausea and/or vomiting and/or fevers greater than 103 or shaking chills to contact the office immediately or seek treatment in the emergency department for emergent intervention.     3. Microscopic hematuria -likely due to stone -will continue to monitor to ensure resolution once stone is treated      Return for left URS/LL/ureteral stent placement.  Zara Council, PA-C  [facility] [address] [place], [place] [address] [phone]

## 2022-08-26 ENCOUNTER — Encounter: Payer: Self-pay | Admitting: Urology

## 2022-08-26 ENCOUNTER — Other Ambulatory Visit: Payer: Self-pay

## 2022-08-26 ENCOUNTER — Ambulatory Visit
Admission: RE | Admit: 2022-08-26 | Discharge: 2022-08-26 | Disposition: A | Discharge status: Home / Self Care | Payer: Medicare Other | Source: Ambulatory Visit | Attending: Urology | Admitting: Urology

## 2022-08-26 ENCOUNTER — Other Ambulatory Visit: Payer: Self-pay | Admitting: Urology

## 2022-08-26 ENCOUNTER — Encounter: Payer: Self-pay | Admitting: Cardiovascular Disease

## 2022-08-26 ENCOUNTER — Ambulatory Visit (INDEPENDENT_AMBULATORY_CARE_PROVIDER_SITE_OTHER): Payer: Medicare Other | Admitting: Urology

## 2022-08-26 VITALS — BP 101/63 | HR 84 | Ht 71.0 in | Wt 220.0 lb

## 2022-08-26 DIAGNOSIS — N200 Calculus of kidney: Secondary | ICD-10-CM | POA: Diagnosis present

## 2022-08-26 DIAGNOSIS — Z87442 Personal history of urinary calculi: Secondary | ICD-10-CM | POA: Insufficient documentation

## 2022-08-26 DIAGNOSIS — N201 Calculus of ureter: Secondary | ICD-10-CM

## 2022-08-26 DIAGNOSIS — R3129 Other microscopic hematuria: Secondary | ICD-10-CM

## 2022-08-26 DIAGNOSIS — R109 Unspecified abdominal pain: Secondary | ICD-10-CM | POA: Diagnosis not present

## 2022-08-26 LAB — MICROSCOPIC EXAMINATION

## 2022-08-26 LAB — URINALYSIS, COMPLETE
Bilirubin, UA: NEGATIVE
Glucose, UA: NEGATIVE
Nitrite, UA: NEGATIVE
Specific Gravity, UA: 1.03 (ref 1.005–1.030)
Urobilinogen, Ur: 0.2 mg/dL (ref 0.2–1.0)
pH, UA: 5 (ref 5.0–7.5)

## 2022-08-26 MED ORDER — SULFAMETHOXAZOLE-TRIMETHOPRIM 800-160 MG PO TABS: 1.0000 | ORAL_TABLET | Freq: Two times a day (BID) | ORAL | 0 refills | Status: DC

## 2022-08-26 MED ORDER — OXYCODONE-ACETAMINOPHEN 10-325 MG PO TABS: 1.0000 | ORAL_TABLET | ORAL | 0 refills | Status: DC | PRN

## 2022-08-26 NOTE — Progress Notes (Signed)
Surgical Physician Order Form [facility] [place]  * Scheduling expectation : Next Available w/ Dr. Apolinar Junes  *Length of Case:   *Clearance needed: yes - patient has a PPM  and takes ASA and Plavix -Dr. Nicki Guadalajara is cardiologist  [phone]  *Anticoagulation Instructions: Hold all anticoagulants  *Aspirin Instructions: Hold Aspirin  *Post-op visit Date/Instructions:   TBD  *Diagnosis: Left Ureteral Stone  *Procedure: left Ureteroscopy w/laser lithotripsy & stent placement (88502)   Additional orders: N/A  -Admit type: OUTpatient  -Anesthesia: General  -VTE Prophylaxis Standing Order SCD's       Other:   -Standing Lab Orders Per Anesthesia    Lab other: None  -Standing Test orders EKG/Chest x-ray per Anesthesia       Test other:   - Medications:  Ancef 2gm IV  -Other orders:  N/A

## 2022-08-27 ENCOUNTER — Ambulatory Visit: Payer: Medicare Other | Attending: Internal Medicine | Admitting: Internal Medicine

## 2022-08-27 ENCOUNTER — Encounter: Payer: Self-pay | Admitting: Internal Medicine

## 2022-08-27 VITALS — BP 116/70 | HR 85 | Ht 71.0 in | Wt 224.8 lb

## 2022-08-27 DIAGNOSIS — Z950 Presence of cardiac pacemaker: Secondary | ICD-10-CM | POA: Diagnosis present

## 2022-08-27 DIAGNOSIS — R001 Bradycardia, unspecified: Secondary | ICD-10-CM | POA: Insufficient documentation

## 2022-08-27 DIAGNOSIS — I442 Atrioventricular block, complete: Secondary | ICD-10-CM | POA: Insufficient documentation

## 2022-08-27 NOTE — Telephone Encounter (Signed)
Occasional use of ibuprofen is OK, but would avoid daily use (that can cause increased of stomach bleeding).

## 2022-08-27 NOTE — Progress Notes (Addendum)
HPI Patrick [name] returns today for folllowup. The patient is a very pleasant 66 yo man with a h/o CAD, s/p MI with preserved LV function, CHB, s/p PPM insertion. The patient denies anginal symptoms and sob. No syncope. He notes that his energy level is still not what he would like. He thinks that his breathing is not quite as good with exertion.   No Known Allergies   Current Outpatient Medications  Medication Sig Dispense Refill   amLODipine (NORVASC) 5 MG tablet Take 5 mg by mouth daily.     aspirin (ASPIRIN LOW DOSE) 81 MG EC tablet Take 1 tablet (81 mg total) by mouth daily. 90 tablet 0   clopidogrel (PLAVIX) 75 MG tablet Take 1 tablet (75 mg total) by mouth daily. 90 tablet 3   Coenzyme Q10 200 MG capsule Take 200 mg by mouth daily.     Glucosamine-Chondroit-Vit C-Mn (GLUCOSAMINE 1500 COMPLEX PO) Take 1,500 mg by mouth daily.     isosorbide mononitrate (IMDUR) 60 MG 24 hr tablet Take 60 mg by mouth daily.     Magnesium 250 MG TABS Take 250 mg by mouth daily.     metFORMIN (GLUCOPHAGE-XR) 500 MG 24 hr tablet Take 1,000 mg by mouth 2 (two) times daily.     Multiple Vitamins-Minerals (ABC PLUS SENIOR PO) Take 1 tablet by mouth daily.     MYRBETRIQ 25 MG TB24 tablet TAKE 1 TABLET(25 MG) BY MOUTH DAILY (Patient taking differently: Take 25 mg by mouth every evening.) 30 tablet 11   nitroGLYCERIN (NITROSTAT) 0.4 MG SL tablet Place 1 tablet (0.4 mg total) under the tongue every 5 (five) minutes x 3 doses as needed for chest pain. 25 tablet 3   nystatin cream (MYCOSTATIN) Apply 1 application. topically daily as needed for dry skin.     Omega-3 Fatty Acids (FISH OIL OMEGA-3 PO) Take 2 capsules by mouth 2 (two) times daily.      oxyCODONE-acetaminophen (PERCOCET) 10-325 MG tablet Take 1 tablet by mouth every 4 (four) hours as needed for pain. 10 tablet 0   pantoprazole (PROTONIX) 40 MG tablet Take 40 mg by mouth daily.     Potassium 99 MG TABS Take 99 mg by mouth daily.     pyridOXINE  (VITAMIN B-6) 50 MG tablet Take 50 mg by mouth daily.     ranolazine (RANEXA) 1000 MG SR tablet Take 1 tablet (1,000 mg total) by mouth 2 (two) times daily. 180 tablet 0   rosuvastatin (CRESTOR) 20 MG tablet Take 20 mg by mouth daily.     SEMAGLUTIDE PO Take 1 tablet by mouth daily.     sulfamethoxazole-trimethoprim (BACTRIM DS) 800-160 MG tablet Take 1 tablet by mouth every 12 (twelve) hours. 14 tablet 0   tamsulosin (FLOMAX) 0.4 MG CAPS capsule Take 2 capsules (0.8 mg total) by mouth daily. (Patient taking differently: Take 0.8 mg by mouth every evening.) 180 capsule 3   triamcinolone cream (KENALOG) 0.1 % Apply 1 application. topically daily as needed (rash).     vitamin C (ASCORBIC ACID) 500 MG tablet Take 500 mg by mouth daily.     No current facility-administered medications for this visit.     Past Medical History:  Diagnosis Date   CAD in native artery    a. anterolateral STEMI 07/2017 s/p DES to prox-mid LAD, EF 55%.   GERD (gastroesophageal reflux disease)    Hyperlipidemia    Hypertension    Overactive bladder    Renal  calculi    Syncope    Uncontrolled diabetes mellitus     ROS:   All systems reviewed and negative except as noted in the HPI.   Past Surgical History:  Procedure Laterality Date   CORONARY/GRAFT ACUTE MI REVASCULARIZATION N/A 08/04/2017   Procedure: Coronary/Graft Acute MI Revascularization;  Surgeon: Lennette Bihari, MD;  Location: [facility];  Service: Cardiovascular;  Laterality: N/A;   LEFT HEART CATH AND CORONARY ANGIOGRAPHY N/A 08/04/2017   Procedure: LEFT HEART CATH AND CORONARY ANGIOGRAPHY;  Surgeon: Lennette Bihari, MD;  Location: [facility];  Service: Cardiovascular;  Laterality: N/A;   LEFT HEART CATH AND CORONARY ANGIOGRAPHY N/A 03/21/2018   Procedure: LEFT HEART CATH AND CORONARY ANGIOGRAPHY;  Surgeon: Lennette Bihari, MD;  Location: [facility] INVASIVE [facility];  Service: Cardiovascular;  Laterality: N/A;   PACEMAKER IMPLANT N/A  05/17/2022   Procedure: PACEMAKER IMPLANT;  Surgeon: Marinus Maw, MD;  Location: [facility]AS[facility];  Service: Cardiovascular;  Laterality: N/A;   PROSTATE SURGERY     TEMPORARY PACEMAKER N/A 07/14/2021   Procedure: TEMPORARY PACEMAKER;  Surgeon: Marykay Lex, MD;  [facility]: [facility] IN[facility];  Service: Cardiovascular;  Laterality: N/A;     Family History  Problem Relation Age of Onset   Dementia Mother    Heart disease Father    Heart attack Father      Social History   Socioeconomic History   Marital status: Married    Spouse name: Not on file   Number of children: 3   Years of education: GED   Highest education level: Not on file  Occupational History   Occupation: Editor, commissioning  Tobacco Use   Smoking status: Former    Types: Cigarettes   Smokeless tobacco: Never   Tobacco comments:    Quit smoking at age 48.  Substance and Sexual Activity   Alcohol use: No   Drug use: No   Sexual activity: Not on file  Other Topics Concern   Not on file  Social History Narrative   Lives at home with his wife.   Right-handed.   Caffeine use: 3.5 cups per day.   Social Determinants of Health   Financial Resource Strain: Not on file  Food Insecurity: Not on file  Transportation Needs: Not on file  Physical Activity: Not on file  Stress: Not on file  Social Connections: Not on file  Intimate Partner Violence: Not on file     BP 116/70   Pulse 85   Ht 5\' 11"  (1.803 m)   Wt 224 lb 12.8 oz (102 kg)   SpO2 97%   BMI 31.35 kg/m   Physical Exam:  Well appearing NAD HEENT: Unremarkable Neck:  No JVD, no thyromegally Lymphatics:  No adenopathy Back:  No CVA tenderness Lungs:  Clear with no wheezes HEART:  Regular rate rhythm, no murmurs, no rubs, no clicks Abd:  soft, positive bowel sounds, no organomegally, no rebound, no guarding Ext:  2 plus pulses, no edema, no cyanosis, no clubbing Skin:  No rashes no nodules Neuro:  CN II through XII intact, motor  grossly intact  EKG - nsr with ventricular pacing  DEVICE  Normal device function.  See PaceArt for details.   Assess/Plan:  CHB - he is s/p PPM insertion. No syncope. Dyspnea - review of his histograms demonstrates some blunting of the HR response. I have adjusted his chronotropic response by turning CLS on. We will see how he  does. If he is not improved, we would consider an exercise test to asssess. PPM -his Biotronik DDD PM is working normally. No other changes made except his outputs turned down. CAD - he denies anginal symptoms. We will follow. 5. Preop eval - patient is an acceptable risk for upcoming surgery. He may hold plavix and ASA for up to 7 days prior to the procedure. Restart both when the bleeding risk is acceptable.  Sharlot Gowda Sean Malinowski,MD

## 2022-08-27 NOTE — Patient Instructions (Signed)
Medication Instructions:  Your physician recommends that you continue on your current medications as directed. Please refer to the Current Medication list given to you today.  *If you need a refill on your cardiac medications before your next appointment, please call your pharmacy*  Lab Work: None ordered.  If you have labs (blood work) drawn today and your tests are completely normal, you will receive your results only by: MyChart Message (if you have MyChart) OR A paper copy in the mail If you have any lab test that is abnormal or we need to change your treatment, we will call you to review the results.  Testing/Procedures: None ordered.  Follow-Up: At [facility], you and your health needs are our priority.  As part of our continuing mission to provide you with exceptional heart care, we have created designated Provider Care Teams.  These Care Teams include your primary Cardiologist (physician) and Advanced Practice Providers (APPs -  Physician Assistants and Nurse Practitioners) who all work together to provide you with the care you need, when you need it.  We recommend signing up for the patient portal called "MyChart".  Sign up information is provided on this After Visit Summary.  MyChart is used to connect with patients for Virtual Visits (Telemedicine).  Patients are able to view lab/test results, encounter notes, upcoming appointments, etc.  Non-urgent messages can be sent to your provider as well.   To learn more about what you can do with MyChart, go to [email or web address].    Your next appointment:   1 year(s)  The format for your next appointment:   In Person  Provider:   Gregg Taylor, MD{or one of the following Advanced Practice Providers on your designated Care Team:   Renee Ursuy, PA-C Michael "Andy" Tillery, PA-C  Remote monitoring is used to monitor your Pacemaker from home. This monitoring reduces the number of office visits required to check your device to  one time per year. It allows us to keep an eye on the functioning of your device to ensure it is working properly. You are scheduled for a device check from home on 11/18/22. You may send your transmission at any time that day. If you have a wireless device, the transmission will be sent automatically. After your physician reviews your transmission, you will receive a postcard with your next transmission date.  Important Information About Sugar       

## 2022-08-29 LAB — CULTURE, URINE COMPREHENSIVE

## 2022-08-31 ENCOUNTER — Telehealth: Payer: Self-pay

## 2022-08-31 ENCOUNTER — Telehealth: Payer: Self-pay | Admitting: *Deleted

## 2022-08-31 ENCOUNTER — Telehealth: Payer: Self-pay | Admitting: Urgent Care

## 2022-08-31 ENCOUNTER — Encounter: Payer: Self-pay | Admitting: Internal Medicine

## 2022-08-31 NOTE — Telephone Encounter (Signed)
I spoke with Mr. Patrick [name]. We have discussed possible surgery dates and Thursday September 28th, 2023 was agreed upon by all parties. Patient given information about surgery date, what to expect pre-operatively and post operatively.  We discussed that a Pre-Admission Testing office will be calling to set up the pre-op visit that will take place prior to surgery, and that these appointments are typically done over the phone with a Pre-Admissions RN.  Informed patient that our office will communicate any additional care to be provided after surgery. Patients questions or concerns were discussed during our call. Advised to call our office should there be any additional information, questions or concerns that arise. Patient verbalized understanding.

## 2022-08-31 NOTE — Telephone Encounter (Signed)
-----   Message from Karen Kitchens, NP sent at 08/31/2022  1:54 PM EDT ----- Regarding: Request for pre-operative cardiac clearance Request for pre-operative cardiac clearance:  1. What type of surgery is being performed?  CYSTOSCOPY/URETEROSCOPY/HOLMIUM LASER/STENT PLACEMENT  2. When is this surgery scheduled?  09/17/2022  3. Type of clearance being requested (medical, pharmacy, both). BOTH   4. Are there any medications that need to be held prior to surgery? ASA + CLOPIDOGREL  5. Practice name and name of physician performing surgery?  Performing surgeon: Dr. Hollice Espy, MD Requesting clearance: Honor Loh, FNP-C    6. Anesthesia type (none, local, MAC, general)? GENERAL  7. What is the office phone and fax number?   Phone: [phone] Fax: [phone]  ATTENTION: Unable to create telephone message as per your standard workflow. Directed by [facility] providers to send requests for cardiac clearance to this pool for appropriate distribution to provider covering pre-operative clearances.   Honor Loh, MSN, APRN, FNP-C, CEN [facility]  Peri-operative Services Nurse Practitioner Phone: [phone] 08/31/22 1:54 PM

## 2022-08-31 NOTE — Progress Notes (Signed)
[facility] Posting Form   Surgery Date/Time: Date: 09/17/2022  Surgeon: Dr. Vanna Scotland, MD  Surgery Location: Day Surgery  Inpt ( No  )   Outpt (Yes)   Obs ( No  )   Diagnosis: N20.1 Left Ureteral Stone  -CPT: [phone]  Surgery:  Left Ureteroscopy with laser lithotripsy and stent placement  Stop Anticoagulations: Yes, will need to hold Plavix and ASA  Cardiac/Medical/Pulmonary Clearance needed: yes  Clearance needed from Dr: Tresa Endo  Clearance request sent on: Date: 08/31/22 sent by Quentin Mulling, NP with PeriOperative Services Patient also has a Pacemaker.  *Orders entered into EPIC  Date: 08/31/22   *Case booked in EPIC  Date: 08/28/2022  *Notified pt of Surgery: Date: 08/28/2022  PRE-OP UA & CX: no  *Placed into Prior Authorization Work Que Date: 08/31/22   Assistant/laser/rep:No

## 2022-08-31 NOTE — Progress Notes (Signed)
  Perioperative Services Pre-Admission/Anesthesia Testing     Date: 08/31/22  Name: Patrick [name] MRN:   [id number]  Re: Request from surgery for clearance prior to scheduled procedure  Patient is scheduled to undergo a CYSTOSCOPY/URETEROSCOPY/HOLMIUM LASER/STENT PLACEMENT on 09/17/2022 with Dr. Vanna Scotland, MD. Patient has not been scheduled for his PAT appointment at this point, thus has not undergone review by PAT RN and/or APP. Received communication from primary attending surgeon's office requesting that patient be submitted for clearance from cardiology.   PROVIDER SPECIALTY FAXED TO   Nicki Guadalajara, MD  Cardiology  Routed to APP pool in [facility]   Plan:  Clearance documents generated and faxed to appropriate provider(s) as noted above. Note will be updated to reflect communication with provider's office as it relates to clearance being provided and/or the need for office visit prior to clearance for surgery being issued.   Quentin Mulling, MSN, APRN, FNP-C, CEN [facility]  Peri-operative Services Nurse Practitioner Phone: [phone] 08/31/22 1:55 PM  NOTE: This note has been prepared using Dragon dictation software. Despite my best ability to proofread, there is always the potential that unintentional transcriptional errors may still occur from this process.

## 2022-08-31 NOTE — Progress Notes (Signed)
PERIOPERATIVE PRESCRIPTION FOR IMPLANTED CARDIAC DEVICE PROGRAMMING  Patient Information: Name:  Patrick [name]  DOB:  Apr 08, 1956  MRN:  [id number]    Planned Procedure: CYSTOSCOPY/URETEROSCOPY/HOLMIUM LASER/STENT PLACEMENT    Surgeon:  Dr. Vanna Scotland, MD  Requesting device clearance: Quentin Mulling, FNP-C  Date of Procedure:  09/17/2022  Cautery will be used.   Please route documentation back me via [facility], or may fax report to [facility] PAT APP at [phone].   Device Information:  Clinic EP Physician:  Lewayne Bunting, MD   Device Type:  Pacemaker Manufacturer and Phone #:  [employer]: [phone] Pacemaker Dependent?:  No. Date of Last Device Check:  08/19/22 Normal Device Function?:  Yes.    Electrophysiologist's Recommendations:  Have magnet available. Provide continuous ECG monitoring when magnet is used or reprogramming is to be performed.  Procedure may interfere with device function.  Magnet should be placed over device during procedure.  Per Device Clinic Standing Orders, Lenor Coffin, RN  2:24 PM 08/31/2022

## 2022-09-01 ENCOUNTER — Other Ambulatory Visit: Payer: Self-pay

## 2022-09-01 DIAGNOSIS — N201 Calculus of ureter: Secondary | ICD-10-CM

## 2022-09-01 NOTE — Telephone Encounter (Signed)
Pt booked for early week appt, KUB orders placed.

## 2022-09-03 NOTE — Telephone Encounter (Signed)
My note has been addended. Thanks. GT

## 2022-09-04 NOTE — Telephone Encounter (Signed)
   Primary Cardiologist: Nicki Guadalajara, MD  Chart reviewed as part of pre-operative protocol coverage. Given past medical history and time since last visit, based on ACC/AHA guidelines, [name] would be at acceptable risk for the planned procedure without further cardiovascular testing.   He may hold Plavix and ASA for up to 7 days prior to the procedure. Restart both when the bleeding risk is acceptable  I will route this recommendation to the requesting party via Epic fax function and remove from pre-op pool.  Please call with questions.  [name], NP-C    09/04/2022, 8:32 AM [address], [address] Office [phone] Fax [phone]

## 2022-09-07 NOTE — Progress Notes (Unsigned)
09/08/2022 12:22 PM   Patrick [name] 1956-06-02 [id number]  Referring provider: Kirk Ruths, MD [place] [facility] [place],  [place] [address]  Urological history: 1. Elevated PSA - biopsy 2004 iPSA 6.8 negative - biopsy 2008 iPSA 6.3 negative - PSA Trend PSA 3.87 in 09/2021   Prostate Specific Ag, Serum  Latest Ref Rng 0.0 - 4.0 ng/mL  03/22/2018 3.5   09/26/2019 3.2   10/15/2020 4.1 (H)   12/26/2020 4.5 (H)   05/08/2021 3.1   04/20/2022 3.8     Legend: (H) High  2. BPH with LU TS -~15 years ago TUMT - I PSS 15/2 - Managed with tamsulosin 0.4 mg daily  3. Pelvic floor dysfunction - documented increased EMG activity with all voids from urodynamics in 2018 - underwent PT  4. Nocturia - managed with Myrbetriq 25 mg daily - not interested in sleep study  5. ED -contributing factors of age, CAD, diabetes, HLD, HTN and former smoker -using alternative measures for sexual activity   No chief complaint on file.    HPI: Patrick [name] is a 66 y.o. male who presents today for possible stone.   He states since last Thursday afternoon he has been having an intermittent dull pain in his lower abdomen associated with difficulty urinating.  The pain then became more localized in the left flank.  Patient denies any modifying or aggravating factors.  Patient denies any gross hematuria, dysuria or suprapubic/flank pain.  Patient denies any fevers, chills, nausea or vomiting.    UA 11-30 WBCs, 11-30 RBCs and few bacteria noted  KUB (08/2022) - 8 mm calcification at the left UVJ  CT Renal stone study (08/2022) - 8 mm left UVJ stone w/ mild hydro - results given by phone with wife as well.   PMH: Past Medical History:  Diagnosis Date   CAD in native artery    a. anterolateral STEMI 07/2017 s/p DES to prox-mid LAD, EF 55%.   GERD (gastroesophageal reflux disease)    Hyperlipidemia    Hypertension    Overactive bladder    Renal calculi    Syncope     Uncontrolled diabetes mellitus     Surgical History: Past Surgical History:  Procedure Laterality Date   CORONARY/GRAFT ACUTE MI REVASCULARIZATION N/A 08/04/2017   Procedure: Coronary/Graft Acute MI Revascularization;  Surgeon: Troy Sine, MD;  Location: [facility];  Service: Cardiovascular;  Laterality: N/A;   LEFT HEART CATH AND CORONARY ANGIOGRAPHY N/A 08/04/2017   Procedure: LEFT HEART CATH AND CORONARY ANGIOGRAPHY;  Surgeon: Troy Sine, MD;  Location: [place] [facility];  Service: Cardiovascular;  Laterality: N/A;   LEFT HEART CATH AND CORONARY ANGIOGRAPHY N/A 03/21/2018   Procedure: LEFT HEART CATH AND CORONARY ANGIOGRAPHY;  Surgeon: Troy Sine, MD;  Location: [facility];  Service: Cardiovascular;  Laterality: N/A;   PACEMAKER IMPLANT N/A 05/17/2022   Procedure: PACEMAKER IMPLANT;  Surgeon: Evans Lance, MD;  Location: [place] [facility];  Service: Cardiovascular;  Laterality: N/A;   PROSTATE SURGERY     TEMPORARY PACEMAKER N/A 07/14/2021   Procedure: TEMPORARY PACEMAKER;  Surgeon: Leonie Man, MD;  Location: [facility] [facility];  Service: Cardiovascular;  Laterality: N/A;    Home Medications:  Allergies as of 09/08/2022   No Known Allergies      Medication List        Accurate as of September 07, 2022 12:22 PM. If you  have any questions, ask your nurse or doctor.          ABC PLUS SENIOR PO Take 1 tablet by mouth daily.   amLODipine 5 MG tablet Commonly known as: NORVASC Take 5 mg by mouth daily.   ascorbic acid 500 MG tablet Commonly known as: VITAMIN C Take 500 mg by mouth daily.   aspirin EC 81 MG tablet Commonly known as: Aspirin Low Dose Take 1 tablet (81 mg total) by mouth daily.   clopidogrel 75 MG tablet Commonly known as: PLAVIX Take 1 tablet (75 mg total) by mouth daily.   Coenzyme Q10 200 MG capsule Take 200 mg by mouth daily.   FISH OIL OMEGA-3 PO Take 2 capsules by mouth 2 (two) times daily.    GLUCOSAMINE 1500 COMPLEX PO Take 1,500 mg by mouth daily.   isosorbide mononitrate 60 MG 24 hr tablet Commonly known as: IMDUR Take 60 mg by mouth daily.   Magnesium 250 MG Tabs Take 250 mg by mouth daily.   metFORMIN 500 MG 24 hr tablet Commonly known as: GLUCOPHAGE-XR Take 1,000 mg by mouth 2 (two) times daily.   Myrbetriq 25 MG Tb24 tablet Generic drug: mirabegron ER TAKE 1 TABLET(25 MG) BY MOUTH DAILY What changed: See the new instructions.   nitroGLYCERIN 0.4 MG SL tablet Commonly known as: NITROSTAT Place 1 tablet (0.4 mg total) under the tongue every 5 (five) minutes x 3 doses as needed for chest pain.   nystatin cream Commonly known as: MYCOSTATIN Apply 1 application. topically daily as needed for dry skin.   oxyCODONE-acetaminophen 10-325 MG tablet Commonly known as: Percocet Take 1 tablet by mouth every 4 (four) hours as needed for pain.   pantoprazole 40 MG tablet Commonly known as: PROTONIX Take 40 mg by mouth daily.   Potassium 99 MG Tabs Take 99 mg by mouth daily.   pyridOXINE 50 MG tablet Commonly known as: VITAMIN B6 Take 50 mg by mouth daily.   ranolazine 1000 MG SR tablet Commonly known as: RANEXA Take 1 tablet (1,000 mg total) by mouth 2 (two) times daily.   rosuvastatin 20 MG tablet Commonly known as: CRESTOR Take 20 mg by mouth daily.   SEMAGLUTIDE PO Take 1 tablet by mouth daily.   sulfamethoxazole-trimethoprim 800-160 MG tablet Commonly known as: BACTRIM DS Take 1 tablet by mouth every 12 (twelve) hours.   tamsulosin 0.4 MG Caps capsule Commonly known as: FLOMAX Take 2 capsules (0.8 mg total) by mouth daily. What changed: when to take this   triamcinolone cream 0.1 % Commonly known as: KENALOG Apply 1 application. topically daily as needed (rash).        Allergies: No Known Allergies  Family History: Family History  Problem Relation Age of Onset   Dementia Mother    Heart disease Father    Heart attack Father      Social History:  reports that he has quit smoking. His smoking use included cigarettes. He has never used smokeless tobacco. He reports that he does not drink alcohol and does not use drugs.  ROS: For pertinent review of systems please refer to history of present illness  Physical Exam: There were no vitals taken for this visit.  Constitutional:  Well nourished. Alert and oriented, No acute distress. HEENT: [place] AT, moist mucus membranes.  Trachea midline Cardiovascular: No clubbing, cyanosis, or edema. Respiratory: Normal respiratory effort, no increased work of breathing. Neurologic: Grossly intact, no focal deficits, moving all 4 extremities. Psychiatric: Normal mood and  affect.   Laboratory Data:  I have reviewed the labs.   Pertinent Imaging ***  I have independently reviewed the films.  See HPI.    Assessment & Plan:    1. Left flank pain -*** -UA *** -urine sent for culture  2. Left UVJ stone -Scheduled for left cystoscopy, ureteroscopy with laser lithotripsy and ureteral stent placement on September 17, 2022  3. Microscopic hematuria -likely due to stone -will continue to monitor to ensure resolution once stone is treated      No follow-ups on file.  Poppi Scantling, [place] [address] [place], [place] [address] [phone]

## 2022-09-08 ENCOUNTER — Encounter
Admission: RE | Admit: 2022-09-08 | Discharge: 2022-09-08 | Disposition: A | Discharge status: Home / Self Care | Payer: Medicare Other | Source: Ambulatory Visit | Attending: Urology | Admitting: Urology

## 2022-09-08 ENCOUNTER — Ambulatory Visit (INDEPENDENT_AMBULATORY_CARE_PROVIDER_SITE_OTHER): Payer: Medicare Other | Admitting: Urology

## 2022-09-08 ENCOUNTER — Encounter (HOSPITAL_COMMUNITY): Payer: Self-pay | Admitting: Urgent Care

## 2022-09-08 ENCOUNTER — Ambulatory Visit
Admission: RE | Admit: 2022-09-08 | Discharge: 2022-09-08 | Disposition: A | Discharge status: Home / Self Care | Payer: Medicare Other | Attending: Urology | Admitting: Urology

## 2022-09-08 ENCOUNTER — Ambulatory Visit
Admission: RE | Admit: 2022-09-08 | Discharge: 2022-09-08 | Disposition: A | Discharge status: Home / Self Care | Payer: Medicare Other | Source: Ambulatory Visit | Attending: Urology | Admitting: Urology

## 2022-09-08 ENCOUNTER — Encounter: Payer: Self-pay | Admitting: Urology

## 2022-09-08 VITALS — BP 125/78 | HR 81 | Ht 71.0 in | Wt 213.0 lb

## 2022-09-08 VITALS — Ht 72.0 in | Wt 213.0 lb

## 2022-09-08 DIAGNOSIS — E118 Type 2 diabetes mellitus with unspecified complications: Secondary | ICD-10-CM

## 2022-09-08 DIAGNOSIS — N201 Calculus of ureter: Secondary | ICD-10-CM

## 2022-09-08 DIAGNOSIS — R3129 Other microscopic hematuria: Secondary | ICD-10-CM

## 2022-09-08 DIAGNOSIS — R109 Unspecified abdominal pain: Secondary | ICD-10-CM

## 2022-09-08 HISTORY — DX: Personal history of urinary calculi: Z87.442

## 2022-09-08 HISTORY — DX: Acute myocardial infarction, unspecified: I21.9

## 2022-09-08 HISTORY — DX: Presence of cardiac pacemaker: Z95.0

## 2022-09-08 LAB — URINALYSIS, COMPLETE
Bilirubin, UA: NEGATIVE
Glucose, UA: NEGATIVE
Ketones, UA: NEGATIVE
Leukocytes,UA: NEGATIVE
Nitrite, UA: NEGATIVE
RBC, UA: NEGATIVE
Specific Gravity, UA: 1.03 (ref 1.005–1.030)
Urobilinogen, Ur: 0.2 mg/dL (ref 0.2–1.0)
pH, UA: 5 (ref 5.0–7.5)

## 2022-09-08 LAB — MICROSCOPIC EXAMINATION

## 2022-09-08 NOTE — Patient Instructions (Signed)
Your procedure is scheduled on: 09/17/22 Report to [place]. To find out your arrival time please call [phone] between 1PM - 3PM on 09/16/22.  Remember: Instructions that are not followed completely may result in serious medical risk, up to and including death, or upon the discretion of your surgeon and anesthesiologist your surgery may need to be rescheduled.     _X__ 1. Do not eat food or drink any liquids after midnight the night before your procedure.                 No gum chewing or hard candies.   __X__2.  On the morning of surgery brush your teeth with toothpaste and water, you                 may rinse your mouth with mouthwash if you wish.  Do not swallow any              toothpaste of mouthwash.     _X__ 3.  No Alcohol for 24 hours before or after surgery.   _X__ 4.  Do Not Smoke or use e-cigarettes For 24 Hours Prior to Your Surgery.                 Do not use any chewable tobacco products for at least 6 hours prior to                 surgery.  ____  5.  Bring all medications with you on the day of surgery if instructed.   __X__  6.  Notify your doctor if there is any change in your medical condition      (cold, fever, infections).     Do not wear jewelry, make-up, hairpins, clips or nail polish. Do not wear lotions, powders, or perfumes. You may wear deodorant Do not shave body hair 48 hours prior to surgery. Men may shave face and neck. Do not bring valuables to the hospital.    [facility] is not responsible for any belongings or valuables.  Contacts, dentures/partials or body piercings may not be worn into surgery. Bring a case for your contacts, glasses or hearing aids, a denture cup will be supplied. Leave your suitcase in the car. After surgery it may be brought to your room. For patients admitted to the hospital, discharge time is determined by your treatment team.   Patients discharged the day of  surgery will not be allowed to drive home.    __X__ Take these medicines the morning of surgery with A SIP OF WATER:    1. ranolazine (RANEXA) 1000 MG SR tablet  2. pantoprazole (PROTONIX) 40 MG tablet  3. isosorbide mononitrate (IMDUR) 60 MG 24 hr tablet  4. amLODipine (NORVASC) 5 MG tablet  5.  6.  ____ Fleet Enema (as directed)   ____ Use CHG Soap/SAGE wipes as directed  ____ Use inhalers on the day of surgery  __X__ Stop metformin/Janumet/Farxiga 2 days prior to surgery (Last dose Tuesday AM 9/26)   Stop SEMAGLUTIDE 1 day before surgery (last dose Tues 9/26)  ____ Take 1/2 of usual insulin dose the night before surgery. No insulin the morning          of surgery.   __X__ Stop Blood Thinners Coumadin/Plavix/Xarelto/Pleta/Pradaxa/Eliquis/Effient/Aspirin  LAST DOSE WED 9/20  __X__ Stop Anti-inflammatories 7 days before surgery such as Advil, Ibuprofen, Motrin,  BC or Goodies Powder, Naprosyn, Naproxen, Aleve   __X__  Stop all herbals and  supplements, fish oil or vitamins  until after surgery.  LAST DOSE WED 9/20  ____ Bring C-Pap to the hospital.

## 2022-09-11 LAB — CULTURE, URINE COMPREHENSIVE

## 2022-09-11 NOTE — Progress Notes (Unsigned)
09/14/2022 8:07 AM   Patrick [name] July 13, 1956 [id number]  Referring provider: Kirk Ruths, MD [facility] [place],  [place] [address]  Urological history: 1. Elevated PSA - biopsy 2004 iPSA 6.8 negative - biopsy 2008 iPSA 6.3 negative - PSA Trend PSA 3.87 in 09/2021   Prostate Specific Ag, Serum  Latest Ref Rng 0.0 - 4.0 ng/mL  03/22/2018 3.5   09/26/2019 3.2   10/15/2020 4.1 (H)   12/26/2020 4.5 (H)   05/08/2021 3.1   04/20/2022 3.8     Legend: (H) High  2. BPH with LU TS -~15 years ago TUMT - I PSS 15/2 - Managed with tamsulosin 0.4 mg daily  3. Pelvic floor dysfunction - documented increased EMG activity with all voids from urodynamics in 2018 - underwent PT  4. Nocturia - managed with Myrbetriq 25 mg daily - not interested in sleep study  5. ED -contributing factors of age, CAD, diabetes, HLD, HTN and former smoker -using alternative measures for sexual activity   No chief complaint on file.    HPI: Patrick [name] is a 66 y.o. male who presents today for repeat KUB.  He is currently scheduled for a cystoscopy/left ureteroscopy with laser lithotripsy and ureteral stent placement on September 28 with Dr. Erlene Quan.     Urine culture ***  KUB ***  PMH: Past Medical History:  Diagnosis Date   CAD in native artery    a. anterolateral STEMI 07/2017 s/p DES to prox-mid LAD, EF 55%.   GERD (gastroesophageal reflux disease)    History of kidney stones    Hyperlipidemia    Hypertension    Myocardial infarction [facility])    Overactive bladder    Presence of permanent cardiac pacemaker    Renal calculi    Syncope    Uncontrolled diabetes mellitus     Surgical History: Past Surgical History:  Procedure Laterality Date   CORONARY/GRAFT ACUTE MI REVASCULARIZATION N/A 08/04/2017   Procedure: Coronary/Graft Acute MI Revascularization;  Surgeon: Troy Sine, MD;  Location: [place] [facility];  Service: Cardiovascular;   Laterality: N/A;   LEFT HEART CATH AND CORONARY ANGIOGRAPHY N/A 08/04/2017   Procedure: LEFT HEART CATH AND CORONARY ANGIOGRAPHY;  Surgeon: Troy Sine, MD;  Location: [place] [facility];  Service: Cardiovascular;  Laterality: N/A;   LEFT HEART CATH AND CORONARY ANGIOGRAPHY N/A 03/21/2018   Procedure: LEFT HEART CATH AND CORONARY ANGIOGRAPHY;  Surgeon: Troy Sine, MD;  Location: [place] [facility];  Service: Cardiovascular;  Laterality: N/A;   PACEMAKER IMPLANT N/A 05/17/2022   Procedure: PACEMAKER IMPLANT;  Surgeon: Evans Lance, MD;  Location: [place] [facility];  Service: Cardiovascular;  Laterality: N/A;   PROSTATE SURGERY     in office Dr Yves Dill   TEMPORARY PACEMAKER N/A 07/14/2021   Procedure: TEMPORARY PACEMAKER;  Surgeon: Leonie Man, MD;  Location: [place] [facility];  Service: Cardiovascular;  Laterality: N/A;   TONSILLECTOMY      Home Medications:  Allergies as of 09/14/2022   No Known Allergies      Medication List        Accurate as of September 11, 2022  8:07 AM. If you have any questions, ask your nurse or doctor.          ABC PLUS SENIOR PO Take 1 tablet by mouth daily.   amLODipine 5 MG tablet Commonly known as: NORVASC Take 5 mg by mouth daily.  ascorbic acid 500 MG tablet Commonly known as: VITAMIN C Take 500 mg by mouth daily.   aspirin EC 81 MG tablet Commonly known as: Aspirin Low Dose Take 1 tablet (81 mg total) by mouth daily.   clopidogrel 75 MG tablet Commonly known as: PLAVIX Take 1 tablet (75 mg total) by mouth daily.   Coenzyme Q10 200 MG capsule Take 200 mg by mouth daily.   FISH OIL OMEGA-3 PO Take 2 capsules by mouth 2 (two) times daily.   GLUCOSAMINE 1500 COMPLEX PO Take 1,500 mg by mouth daily.   isosorbide mononitrate 60 MG 24 hr tablet Commonly known as: IMDUR Take 60 mg by mouth daily.   Magnesium 250 MG Tabs Take 250 mg by mouth daily.   metFORMIN 500 MG 24 hr tablet Commonly known as:  GLUCOPHAGE-XR Take 1,000 mg by mouth 2 (two) times daily.   Myrbetriq 25 MG Tb24 tablet Generic drug: mirabegron ER TAKE 1 TABLET(25 MG) BY MOUTH DAILY What changed: See the new instructions.   nitroGLYCERIN 0.4 MG SL tablet Commonly known as: NITROSTAT Place 1 tablet (0.4 mg total) under the tongue every 5 (five) minutes x 3 doses as needed for chest pain.   nystatin cream Commonly known as: MYCOSTATIN Apply 1 application. topically daily as needed for dry skin.   oxyCODONE-acetaminophen 10-325 MG tablet Commonly known as: Percocet Take 1 tablet by mouth every 4 (four) hours as needed for pain.   pantoprazole 40 MG tablet Commonly known as: PROTONIX Take 40 mg by mouth daily.   Potassium 99 MG Tabs Take 99 mg by mouth daily.   pyridOXINE 50 MG tablet Commonly known as: VITAMIN B6 Take 50 mg by mouth daily.   ranolazine 1000 MG SR tablet Commonly known as: RANEXA Take 1 tablet (1,000 mg total) by mouth 2 (two) times daily.   rosuvastatin 20 MG tablet Commonly known as: CRESTOR Take 20 mg by mouth at bedtime.   SEMAGLUTIDE PO Take 1 tablet by mouth daily.   tamsulosin 0.4 MG Caps capsule Commonly known as: FLOMAX Take 2 capsules (0.8 mg total) by mouth daily. What changed: when to take this   triamcinolone cream 0.1 % Commonly known as: KENALOG Apply 1 application. topically daily as needed (rash).        Allergies: No Known Allergies  Family History: Family History  Problem Relation Age of Onset   Dementia Mother    Heart disease Father    Heart attack Father     Social History:  reports that he has quit smoking. His smoking use included cigarettes. He has never used smokeless tobacco. He reports current alcohol use of about 2.0 standard drinks of alcohol per week. He reports that he does not use drugs.  ROS: For pertinent review of systems please refer to history of present illness  Physical Exam: There were no vitals taken for this visit.   Constitutional:  Well nourished. Alert and oriented, No acute distress. HEENT: [place] AT, moist mucus membranes.  Trachea midline Cardiovascular: No clubbing, cyanosis, or edema. Respiratory: Normal respiratory effort, no increased work of breathing. GU: No CVA tenderness.  No bladder fullness or masses.  Patient with circumcised/uncircumcised phallus. ***Foreskin easily retracted***  Urethral meatus is patent.  No penile discharge. No penile lesions or rashes. Scrotum without lesions, cysts, rashes and/or edema.  Testicles are located scrotally bilaterally. No masses are appreciated in the testicles. Left and right epididymis are normal. Rectal: Patient with  normal sphincter tone. Anus and perineum without  scarring or rashes. No rectal masses are appreciated. Prostate is approximately *** grams, *** nodules are appreciated. Seminal vesicles are normal. Neurologic: Grossly intact, no focal deficits, moving all 4 extremities. Psychiatric: Normal mood and affect.   Laboratory Data: *** I have reviewed the labs.   Pertinent Imaging ** I have independently reviewed the films.  See HPI.  Radiologist interpretation pending  Assessment & Plan:    1. Left flank pain -resolved -UA w/ pyuria -urine sent for culture -stone appears to have migrated into the bladder  2. Left UVJ stone -Scheduled for left cystoscopy, ureteroscopy with laser lithotripsy and ureteral stent placement on September 17, 2022 -Stone appears to migrate into the bladder, but he still having frequency and urgency which makes me concerned that it may be still caught in the UVJ region on the internal side of the bladder -Have given him a strainer instructed to continue to strain urine -He will return on Monday for follow-up KUB to see if the stone has migrated further into the bladder  3. Microscopic hematuria -resolved  -will continue to monitor to ensure resolution once stone is treated      No follow-ups on  file.  Cloretta Ned  [facility] [address] [place], [place] [address] [phone]

## 2022-09-11 NOTE — Progress Notes (Signed)
Remote pacemaker transmission.   

## 2022-09-14 ENCOUNTER — Other Ambulatory Visit: Payer: Self-pay | Admitting: *Deleted

## 2022-09-14 ENCOUNTER — Ambulatory Visit
Admission: RE | Admit: 2022-09-14 | Discharge: 2022-09-14 | Disposition: A | Discharge status: Home / Self Care | Payer: Medicare Other | Attending: Urology | Admitting: Urology

## 2022-09-14 ENCOUNTER — Encounter: Payer: Self-pay | Admitting: Urology

## 2022-09-14 ENCOUNTER — Ambulatory Visit (INDEPENDENT_AMBULATORY_CARE_PROVIDER_SITE_OTHER): Payer: Medicare Other | Admitting: Urology

## 2022-09-14 ENCOUNTER — Other Ambulatory Visit: Payer: Self-pay

## 2022-09-14 ENCOUNTER — Ambulatory Visit
Admission: RE | Admit: 2022-09-14 | Discharge: 2022-09-14 | Disposition: A | Discharge status: Home / Self Care | Payer: Medicare Other | Source: Ambulatory Visit | Attending: Urology | Admitting: Urology

## 2022-09-14 VITALS — BP 126/80 | HR 94 | Ht 72.0 in | Wt 220.0 lb

## 2022-09-14 DIAGNOSIS — R3129 Other microscopic hematuria: Secondary | ICD-10-CM

## 2022-09-14 DIAGNOSIS — Z87442 Personal history of urinary calculi: Secondary | ICD-10-CM

## 2022-09-14 DIAGNOSIS — N201 Calculus of ureter: Secondary | ICD-10-CM | POA: Diagnosis not present

## 2022-09-15 ENCOUNTER — Other Ambulatory Visit: Payer: Medicare Other

## 2022-09-15 ENCOUNTER — Other Ambulatory Visit: Payer: Self-pay

## 2022-09-15 DIAGNOSIS — N201 Calculus of ureter: Secondary | ICD-10-CM

## 2022-09-17 ENCOUNTER — Encounter: Payer: Self-pay | Admitting: Cardiovascular Disease

## 2022-09-17 ENCOUNTER — Encounter: Admission: RE | Payer: Self-pay | Source: Home / Self Care

## 2022-09-17 ENCOUNTER — Ambulatory Visit: Admission: RE | Admit: 2022-09-17 | Payer: Medicare Other | Source: Home / Self Care | Admitting: Urology

## 2022-09-17 ENCOUNTER — Ambulatory Visit: Payer: Medicare Other | Attending: Cardiovascular Disease | Admitting: Cardiovascular Disease

## 2022-09-17 VITALS — BP 150/80 | HR 83 | Ht 72.0 in | Wt 221.2 lb

## 2022-09-17 DIAGNOSIS — I442 Atrioventricular block, complete: Secondary | ICD-10-CM | POA: Diagnosis not present

## 2022-09-17 DIAGNOSIS — I2510 Atherosclerotic heart disease of native coronary artery without angina pectoris: Secondary | ICD-10-CM | POA: Diagnosis not present

## 2022-09-17 DIAGNOSIS — E785 Hyperlipidemia, unspecified: Secondary | ICD-10-CM | POA: Diagnosis present

## 2022-09-17 DIAGNOSIS — Z950 Presence of cardiac pacemaker: Secondary | ICD-10-CM | POA: Insufficient documentation

## 2022-09-17 DIAGNOSIS — E118 Type 2 diabetes mellitus with unspecified complications: Secondary | ICD-10-CM | POA: Diagnosis present

## 2022-09-17 DIAGNOSIS — N200 Calculus of kidney: Secondary | ICD-10-CM | POA: Diagnosis present

## 2022-09-17 DIAGNOSIS — I4510 Unspecified right bundle-branch block: Secondary | ICD-10-CM | POA: Diagnosis not present

## 2022-09-17 SURGERY — CYSTOSCOPY/URETEROSCOPY/HOLMIUM LASER/STENT PLACEMENT
Anesthesia: General | Laterality: Left

## 2022-09-17 NOTE — Patient Instructions (Signed)
Medication Instructions:  The current medical regimen is effective;  continue present plan and medications.  *If you need a refill on your cardiac medications before your next appointment, please call your pharmacy*  Follow-Up: At [facility], you and your health needs are our priority.  As part of our continuing mission to provide you with exceptional heart care, we have created designated Provider Care Teams.  These Care Teams include your primary Cardiologist (physician) and Advanced Practice Providers (APPs -  Physician Assistants and Nurse Practitioners) who all work together to provide you with the care you need, when you need it.  We recommend signing up for the patient portal called "MyChart".  Sign up information is provided on this After Visit Summary.  MyChart is used to connect with patients for Virtual Visits (Telemedicine).  Patients are able to view lab/test results, encounter notes, upcoming appointments, etc.  Non-urgent messages can be sent to your provider as well.   To learn more about what you can do with MyChart, go to [email or web address].    Your next appointment:   6 month(s)  The format for your next appointment:   In Person  Provider:   Thomas Kelly, MD      

## 2022-09-17 NOTE — Progress Notes (Signed)
Cardiology Office Note    Date:  09/20/2022   ID:  ALLARD LIGHTSEY, DOB 11-Nov-1956, MRN [id number]  PCP:  Kirk Ruths, MD  Cardiologist:  Shelva Majestic, MD    History of Present Illness:  Patrick [name] is a 66 y.o. male who suffered an anterior ST segment elevation myocardial infarction on 08/04/2017. I  last saw him in November 2022  He presents for  follow-up evaluation.  Mr. Sandlin has a history of hypertension, hyperlipidemia, and diabetes mellitus.  He was admitted on 08/04/2017 with ST segment elevation anterolaterally and a code STEMI was activated.  I performed emergent cardiac catheterization which revealed total occlusion of his LAD after the first diagonal vessel.  There was a long segment of occlusion and ultimately a Resolute on a 3.038 mm stent was inserted, postdilated 3.25 mm with 100% occlusion being reduced to 0% and TIMI 0 flow been improved to TIMI 3 flow.  He developed transient heart block during the procedure which required IV atropine.  Of note, his lipid panel during that evaluation was consistent with an atherogenic dyslipidemic pattern with triglycerides of 370, VLDL 74, low HDL at 29, and his total cholesterol was 204 with LDL 101.  He was started on atorvastatin and also was on fenofibrate.  Subsequent, please fenofibrate was discontinued by his primary physician and he has been taking over-the-counter fish oil.  Repeat blood work 1 month later by his primary physician showed a total cholesterol 124, LDL 47, triglycerides were 251, HDL was 26.5.  Over the past several months, he has done well.  He denies any definitive recurrent anginal symptomatology.  He had experienced 1 vague episode of  similar sensation after working 16 hours a day for 3 days and having to go to another business trip in [place].    When I  saw him he had noticed some occasional episodes of heartburn sensation, but also has noted this with mild exertion.  He was seen by [name],  NP on 02/28/2018.  Because of worsening heartburn symptoms and fatigue, which seemed nitrate responsive she recommended a follow-up nuclear perfusion study for reassessment.  His nuclear study  on 03/10/2018 was interpreted as a high risk study suggested ischemia involving both the inferior as well as anterior wall.  There was diffuse hypokinesis.  EF was 45%.  When I saw him in follow-up of the nuclear study I recommended definitive cardiac catheterization.  Cardiac catheterization was done on March 21, 2018. This revealed preserved global LV contractility with a small region of very mild residual mid anterolateral hypocontractility.  Ejection fraction was 50-55%.  The stent in the mid LAD was widely patent and his circumflex and dominant RCA were normal.  Medical therapy was recommended and optimization of blood pressure.  Due to lipid studies that were significantly elevated in August 2018 I recommended the addition of his Vascepa to his atorvastatin.   I saw him in May 2019 and last saw him in October 2019.  At that time he continued to do well.  At the time, he was on amlodipine 10 mg and metoprolol 25 mg twice a day.  His resting pulse was 96 and I recommended titration of metoprolol tartrate to 37.5 mg twice a day.  Laboratory on September 19, 2018 at the [facility]: Total cholesterol was 101, HDL 33, LDL 50, triglycerides had improved to 88.  VLDL was 18.  He is enrolled in a study with [name]  which is an oral Victoza therapy.  Hemoglobin A1c before the study was 8.3 and 1 week ago repeat hemoglobin A1c was 6.8.     I evaluated him in a telemedicine visit on October 11, 2019.  At that time he had told me that 3 weeks previously he went to the beach and while walking on the beach for 10 to 15 minutes began to notice a substernal chest discomfort.  He was able to walk through it but at a slower pace.  Since that time, he has experienced some recurrent exertional chest tightness particularly if  walking fast up hills.  He has not tried nitroglycerin.  He denies any rest discomfort.  He denies any palpitations.  He denies PND orthopnea.  He had recently seen [name].  He tells me during his recent evaluation his blood pressure was 131/71 and his pulse was 68.  Laboratory revealed a total cholesterol 109, triglycerides 218, VLDL 44, HDL 29.9, and LDL 36.    During theevaluation, I recommended slight titration of meds Toprol 50 mg twice a day, continue amlodipine 10 mg daily and initiated isosorbide 30 mg.  I scheduled him to undergo a nuclear perfusion study for reevaluation and further assessment of his chest pain and potential for ischemia.  When I saw him in November 2020 he admitted to some mild recurrent chest pain episodes.  He had an episode when he was lifting a refrigerator outside and noticed discomfort.  He underwent a nuclear perfusion study on October 19, 2019.  There was a suggestion of a medium defect of mild severity in the basal inferior to mid inferior wall suggestive of possible ischemia.  He did not have any ECG changes during stress and he had normal wall motion and EF at 59% on quantitative analysis.  His chest pain has some atypical features.  He wasa been taking pantoprazole for GERD.  During that evaluation, I recommended increasing isosorbide to 60 mg daily and also suggested a trial of increasing Protonix to twice a day for the next several weeks to see if there was any improvement.  I saw him in April 2021 and over the prior 2 months he denied any major change in symptomatology.  Oftentimes he may noticed the development of some mild chest discomfort approximately 15 minutes into a walk.  He typically does not walk much longer than that so was uncertain if he was able to walk through the pain.  He denied any severe chest pain or significant dyspnea.  There was one occurrence where he had noticed a vague chest sensation at night.  He has not been successful with weight  loss.    Since his prior evaluation, he presented to [facility] ER on March 29, 2021 with dizziness.  His vital signs were normal and he had extensive imaging and was sent home.  Apparently he had several other occurrences of presyncope and had a syncopal spell on July 14, 2021 and was found to be bradycardic and hypotensive.  He presented to [facility] ER where ECG revealed complete heart block.  He underwent insertion of a temporary pacemaker.  His beta-blocker was held and conduction recovered.  He was seen by Dr. Curt Bears who opted to defer permanent pacemaker implant.  When I saw him in follow-up on July 25, 2021 he felt well and was no longer on rate control medication.  He denied any anginal symptomatology or shortness of breath.  He is still working in Building control surveyor.  His blood pressure  at home has been elevated, and he was started on losartan HCT 100/12.5 mg in place of losartan 100 mg.  He continued to be on amlodipine 10 mg, isosorbide 90 mg, continues to be on aspirin 81 mg and reduced dose of Brilinta 60 mg twice a day.  He continues to be on Ranexa 1000 mg twice a day.  He was seen by his primary physician Dr. Frazier Richards today prior to my evaluation.  During that evaluation he remained stable and had not had any episodes of chest discomfort or dizziness.  He was experiencing easy bruisability on low-dose aspirin and Brilinta 60 mg twice a day and I suggested switching to generic clopidogrel 75 mg daily once he finishes current Brilinta pills.  He was evaluated by Sande Rives in September 2022 after he had experienced another episode of dizziness on August 19, 2021.  EKG showed sinus rhythm with rates in the 80s with chronic right bundle branch block but no high-grade AV block.  At the time his blood pressure was well controlled.  I last saw him on November 18, 2021 at which time he continued to feel well and denied any dizziness over the past month.. He had been evaluated by neurologist  and was given a prescription for meclizine to take as needed.  He denied any recurrent chest pain and is able to walk without discomfort.  He has follow-up scheduled with neurology.  He also had worn a 3-day monitor by Dr. Frazier Richards which apparently did not reveal any major findings.    Since I last saw him, he developed an episode of heart block and received a notice on his phone on May 15, 2022 with heart rates in the 30s.  He was symptomatic with dizziness.  He presented to the emergency room and was given atropine.  ECG showed 2-1 AV block with heart rate of 51 chronic right bundle branch block.  Creatinine was 1.73.  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 Crissie Sickles on May 17, 2022.  Mr. Nesmith was subsequently evaluated on 02/26/2022 by Coletta Memos, NP.  At that time he felt improved he had experienced a short episode of SVT today after his pacemaker insertion.  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  Presently, he feels well.  He still admits to some decreased energy.  He has had issues with a kidney stone.  He was to undergo surgery and had begun to hold Plavix but he passed the "kidney stone on September 25 spontaneously and surgery was aborted.  He is now back on Plavix.  He feels improved since insertion of his pacemaker.  He denies any anginal symptoms.   Past Medical History:  Diagnosis Date   CAD in native artery    a. anterolateral STEMI 07/2017 s/p DES to prox-mid LAD, EF 55%.   GERD (gastroesophageal reflux disease)    History of kidney stones    Hyperlipidemia    Hypertension    Myocardial infarction [facility])    Overactive bladder    Presence of permanent cardiac pacemaker    Renal calculi    Syncope    Uncontrolled diabetes mellitus     Past  Surgical History:  Procedure Laterality Date   CORONARY/GRAFT ACUTE MI REVASCULARIZATION N/A 08/04/2017   Procedure: Coronary/Graft Acute MI  Revascularization;  Surgeon: Troy Sine, MD;  Location: [facility];  Service: Cardiovascular;  Laterality: N/A;   LEFT HEART CATH AND CORONARY ANGIOGRAPHY N/A 08/04/2017   Procedure: LEFT HEART CATH AND CORONARY ANGIOGRAPHY;  Surgeon: Troy Sine, MD;  Location: [facility];  Service: Cardiovascular;  Laterality: N/A;   LEFT HEART CATH AND CORONARY ANGIOGRAPHY N/A 03/21/2018   Procedure: LEFT HEART CATH AND CORONARY ANGIOGRAPHY;  Surgeon: Troy Sine, MD;  Location: [facility];  Service: Cardiovascular;  Laterality: N/A;   PACEMAKER IMPLANT N/A 05/17/2022   Procedure: PACEMAKER IMPLANT;  Surgeon: Evans Lance, MD;  Location: [facility];  Service: Cardiovascular;  Laterality: N/A;   PROSTATE SURGERY     in office Dr Yves Dill   TEMPORARY PACEMAKER N/A 07/14/2021   Procedure: TEMPORARY PACEMAKER;  Surgeon: Leonie Man, MD;  Location: [facility];  Service: Cardiovascular;  Laterality: N/A;   TONSILLECTOMY      Current Medications: Outpatient Medications Prior to Visit  Medication Sig Dispense Refill   amLODipine (NORVASC) 5 MG tablet Take 5 mg by mouth daily.     aspirin (ASPIRIN LOW DOSE) 81 MG EC tablet Take 1 tablet (81 mg total) by mouth daily. 90 tablet 0   clopidogrel (PLAVIX) 75 MG tablet Take 1 tablet (75 mg total) by mouth daily. 90 tablet 3   Coenzyme Q10 200 MG capsule Take 200 mg by mouth daily.     Cyanocobalamin (VITAMIN B-12 PO) Take by mouth.     Glucosamine-Chondroit-Vit C-Mn (GLUCOSAMINE 1500 COMPLEX PO) Take 1,500 mg by mouth daily.     isosorbide mononitrate (IMDUR) 60 MG 24 hr tablet Take 60 mg by mouth daily.     Magnesium 250 MG TABS Take 250 mg by mouth daily.     metFORMIN (GLUCOPHAGE-XR) 500 MG 24 hr tablet Take 1,000 mg by mouth 2 (two) times daily.     Multiple  Vitamins-Minerals (ABC PLUS SENIOR PO) Take 1 tablet by mouth daily.     MYRBETRIQ 25 MG TB24 tablet TAKE 1 TABLET(25 MG) BY MOUTH DAILY (Patient taking differently: Take 25 mg by mouth every evening.) 30 tablet 11   nitroGLYCERIN (NITROSTAT) 0.4 MG SL tablet Place 1 tablet (0.4 mg total) under the tongue every 5 (five) minutes x 3 doses as needed for chest pain. 25 tablet 3   nystatin cream (MYCOSTATIN) Apply 1 application. topically daily as needed for dry skin.     Omega-3 Fatty Acids (FISH OIL OMEGA-3 PO) Take 2 capsules by mouth 2 (two) times daily.      pantoprazole (PROTONIX) 40 MG tablet Take 40 mg by mouth daily.     Potassium 99 MG TABS Take 99 mg by mouth daily.     pyridOXINE (VITAMIN B-6) 50 MG tablet Take 50 mg by mouth daily.     ranolazine (RANEXA) 1000 MG SR tablet Take 1 tablet (1,000 mg total) by mouth 2 (two) times daily. 180 tablet 0   rosuvastatin (CRESTOR) 20 MG tablet Take 20 mg by mouth at bedtime.     SEMAGLUTIDE PO Take 1 tablet by mouth daily.     tamsulosin (FLOMAX) 0.4 MG CAPS capsule Take 2 capsules (0.8 mg total) by mouth daily. (Patient taking differently: Take 0.8 mg by mouth every evening.) 180 capsule 3   triamcinolone cream (KENALOG) 0.1 % Apply 1 application. topically daily as needed (rash).     vitamin C (ASCORBIC ACID) 500 MG tablet Take  500 mg by mouth daily.     oxyCODONE-acetaminophen (PERCOCET) 10-325 MG tablet Take 1 tablet by mouth every 4 (four) hours as needed for pain. (Patient not taking: Reported on 09/17/2022) 10 tablet 0   No facility-administered medications prior to visit.     Allergies:   Patient has no known allergies.   Social History   Socioeconomic History   Marital status: Married    Spouse name: Not on file   Number of children: 3   Years of education: GED   Highest education level: Not on file  Occupational History   Occupation: Energy manager  Tobacco Use   Smoking status: Former    Types: Cigarettes   Smokeless  tobacco: Never   Tobacco comments:    Quit smoking at age 1.  Vaping Use   Vaping Use: Never used  Substance and Sexual Activity   Alcohol use: Yes    Alcohol/week: 2.0 standard drinks of alcohol    Types: 2 Shots of liquor per week   Drug use: No   Sexual activity: Not on file  Other Topics Concern   Not on file  Social History Narrative   Lives at home with his wife.   Right-handed.   Caffeine use: 3.5 cups per day.   Social Determinants of Health   Financial Resource Strain: Not on file  Food Insecurity: Not on file  Transportation Needs: Not on file  Physical Activity: Not on file  Stress: Not on file  Social Connections: Not on file     Family History:  The patient's family history includes Dementia in his mother; Heart attack in his father; Heart disease in his father.   ROS General: Negative; No fevers, chills, or night sweats;  HEENT: Negative; No changes in vision or hearing, sinus congestion, difficulty swallowing Pulmonary: Positive for bronchitis Cardiovascular:   See history of present illness GI: Negative; No nausea, vomiting, diarrhea, or abdominal pain GU: Negative; No dysuria, hematuria, or difficulty voiding Musculoskeletal: Negative; no myalgias, joint pain, or weakness Hematologic/Oncology: Negative; no easy bruising, bleeding Endocrine: Positive for diabetes Neuro: Negative; no changes in balance, headaches Skin: Negative; No rashes or skin lesions Psychiatric: Negative; No behavioral problems, depression Sleep: Negative; No awareness of snoring, daytime sleepiness, hypersomnolence, bruxism, restless legs, hypnogognic hallucinations, no cataplexy Other comprehensive 14 point system review is negative.   PHYSICAL EXAM:   BP (!) 150/80   Pulse 83   Ht 6' (1.829 m)   Wt 221 lb 3.2 oz (100.3 kg)   SpO2 93%   BMI 30.00 kg/m    Repeat blood pressure by me was 140/78.  Wt Readings from Last 3 Encounters:  09/17/22 221 lb 3.2 oz (100.3 kg)   09/14/22 220 lb (99.8 kg)  09/08/22 213 lb (96.6 kg)    General: Alert, oriented, no distress.  Skin: normal turgor, no rashes, warm and dry HEENT: Normocephalic, atraumatic. Pupils equal round and reactive to light; sclera anicteric; extraocular muscles intact;  Nose without nasal septal hypertrophy Mouth/Parynx benign; Mallinpatti scale 3 Neck: No JVD, no carotid bruits; normal carotid upstroke Lungs: clear to ausculatation and percussion; no wheezing or rales Chest wall: without tenderness to palpitation Heart: PMI not displaced, RRR, s1 s2 normal, 1/6 systolic murmur, no diastolic murmur, no rubs, gallops, thrills, or heaves Abdomen: soft, nontender; no hepatosplenomehaly, BS+; abdominal aorta nontender and not dilated by palpation. Back: no CVA tenderness Pulses 2+ Musculoskeletal: full range of motion, normal strength, no joint deformities Extremities: no clubbing cyanosis or  edema, Homan's sign negative  Neurologic: grossly nonfocal; Cranial nerves grossly wnl Psychologic: Normal mood and affect   Studies/Labs Reviewed:   September 17, 2022 ECG (independently read by me): NSR at 68, with intermittent Atrial pacing, RBBB  November 18, 2021 ECG (independently read by me):  NSR at 79, RBBB with repolarization  July 25, 2021: NSR at 85, RBBB, PR 192 msec  July 14, 2021: Complete heart block with RBBB, LPHB, ventricular rate 18 April 2020 ECG in ER (independently read by me): Normal sinus rhythm at 61 bpm.  Right bundle branch block with repolarization changes.  Normal intervals.  No ectopy.  November 07, 2019 EKG:  EKG is ordered today.  Normal sinus rhythm at 67 bpm.  Right bundle branch block with repolarization changes  October 17 2018 ECG (independently read by me): Normal sinus rhythm at 93 bpm.  Right bundle branch block with repolarization changes.  QTc interval 44 ms.  Apr 25, 2018 ECG (independently read by me): Normal sinus rhythm at 73 bpm.  Right bundle branch  block with repolarization changes.  March 16, 2018 ECG (independently read by me): NSR at 63;RBBB with repolarization changes QTc 431 msec  November 2018 ECG (independently read by me): Normal sinus rhythm at 66 bpm.  Right bundle branch block with repolarization changes.  QTc interval 448 ms.  Recent Labs:    Latest Ref Rng & Units 05/16/2022    3:24 AM 05/15/2022    4:01 PM 09/01/2021    4:28 PM  BMP  Glucose 70 - 99 mg/dL 104  152  169   BUN 8 - 23 mg/dL _0 Creatinine 0.61 - 1.24 mg/dL 1.30  1.73  1.59   BUN/Creat Ratio 10 - 24   14   Sodium 135 - 145 mmol/L 137  137  137   Potassium 3.5 - 5.1 mmol/L 3.6  4.7  4.2   Chloride 98 - 111 mmol/L 105  104  99   CO2 22 - 32 mmol/L _1 Calcium 8.9 - 10.3 mg/dL 8.8  8.7  9.5         Latest Ref Rng & Units 05/15/2022    4:01 PM 07/14/2021   11:59 AM 07/14/2021   11:08 AM  Hepatic Function  Total Protein 6.5 - 8.1 g/dL 6.3  5.2  5.3   Albumin 3.5 - 5.0 g/dL 3.9  3.0  3.1   AST 15 - 41 U/L 40  18  21   ALT 0 - 44 U/L 40  22  21   Alk Phosphatase 38 - 126 U/L 35  29  31   Total Bilirubin 0.3 - 1.2 mg/dL 1.0  1.1  0.9        Latest Ref Rng & Units 05/16/2022    3:24 AM 05/15/2022    4:01 PM 09/01/2021    4:28 PM  CBC  WBC 4.0 - 10.5 K/uL 6.3  7.5  6.3   Hemoglobin 13.0 - 17.0 g/dL 12.9  13.9  13.4   Hematocrit 39.0 - 52.0 % 35.5  39.1  39.2   Platelets 150 - 400 K/uL 169  181  228    Lab Results  Component Value Date   MCV 88.5 05/16/2022   MCV 90.1 05/15/2022   MCV 91 09/01/2021   Lab Results  Component Value Date   TSH 0.732 05/15/2022   Lab Results  Component Value Date   HGBA1C  5.9 (H) 07/14/2021     BNP No results found for: "BNP"  ProBNP No results found for: "PROBNP"   Lipid Panel     Component Value Date/Time   CHOL 79 07/14/2021 1159   TRIG 89 07/14/2021 1159   HDL 27 (L) 07/14/2021 1159   CHOLHDL 2.9 07/14/2021 1159   VLDL 18 07/14/2021 1159   LDLCALC 34 07/14/2021 1159      RADIOLOGY: Abdomen 1 view (KUB)  Result Date: 09/14/2022 CLINICAL DATA:  Left side lower abdominal pain; known stone from 1 week ago next EXAM: ABDOMEN - 1 VIEW COMPARISON:  Radiographs 09/08/2022 and CT 08/26/2022 FINDINGS: The bowel gas pattern is normal. The 11 mm stone at the left UVJ seen on radiographs 09/08/2022 is redemonstrated though has shifted rightward. This may be due to changes in bladder distention/obliquity or could have passed into the bladder. No additional radiopaque calculi. IMPRESSION: Previously seen 11 mm left UVJ stone has slightly changed position since 09/08/2022. This may be due to changes in bladder distention/obliquity or could have passed into the bladder. Electronically Signed   By: Placido Sou M.D.   On: 09/14/2022 21:20   Abdomen 1 view (KUB)  Result Date: 09/10/2022 CLINICAL DATA:  Follow-up left ureteral stone EXAM: ABDOMEN - 1 VIEW COMPARISON:  08/26/2022 FINDINGS: Scattered large and small bowel gas is noted. No obstructive changes are seen. Calcification is noted at the left ureterovesical junction similar to that seen on the prior CT examination. No other calculi are noted. No bony abnormality is seen. IMPRESSION: Stable left UVJ stone. Electronically Signed   By: Inez Catalina M.D.   On: 09/10/2022 00:04   Abdomen 1 view (KUB)  Result Date: 08/27/2022 CLINICAL DATA:  Abdomen pain, assess for kidney stones. EXAM: ABDOMEN - 1 VIEW COMPARISON:  CT abdomen and pelvis August 26, 2022 FINDINGS: The bowel gas pattern is normal. 8 mm stone seen in the left ureteral vesicular junction on CT scan is noted on x-ray. IMPRESSION: 8 mm stone seen in the left ureteral vesicular junction on CT scan is noted on x-ray. Electronically Signed   By: Abelardo Diesel M.D.   On: 08/27/2022 16:22   CT RENAL STONE STUDY  Result Date: 08/26/2022 CLINICAL DATA:  Acute left flank pain EXAM: CT ABDOMEN AND PELVIS WITHOUT CONTRAST TECHNIQUE: Multidetector CT imaging of the abdomen  and pelvis was performed following the standard protocol without IV contrast. RADIATION DOSE REDUCTION: This exam was performed according to the departmental dose-optimization program which includes automated exposure control, adjustment of the mA and/or kV according to patient size and/or use of iterative reconstruction technique. COMPARISON:  11/08/2016 FINDINGS: Lower chest: No acute abnormality.  Coronary artery calcifications. Hepatobiliary: No solid liver abnormality is seen. Gallstones. Gallbladder wall thickening, or biliary dilatation. Pancreas: Unremarkable. No pancreatic ductal dilatation or surrounding inflammatory changes. Spleen: Normal in size without significant abnormality. Adrenals/Urinary Tract: Adrenal glands are unremarkable. There is a 0.8 cm calculus at the left ureterovesicular junction, with mild associated left hydronephrosis and hydroureter (series 2, image 90). No right-sided calculi or hydronephrosis. Symmetric bilateral perinephric fat stranding, unchanged. Bladder is unremarkable. Stomach/Bowel: Stomach is within normal limits. Appendix appears normal. No evidence of bowel wall thickening, distention, or inflammatory changes. Sigmoid diverticulosis. Vascular/Lymphatic: Aortic atherosclerosis. No enlarged abdominal or pelvic lymph nodes. Reproductive: No mass or other significant abnormality. Other: Small, containing bilateral inguinal hernias.  No ascites. Musculoskeletal: No acute or significant osseous findings. IMPRESSION: 1. There is a 0.8 cm calculus at the left  ureterovesicular junction, with mild associated left hydronephrosis and hydroureter. 2. No no additional calculi right-sided hydronephrosis. 3. Cholelithiasis without evidence of acute cholecystitis. 4. Sigmoid diverticulosis without evidence of acute diverticulitis. 5. Coronary artery disease. These results will be called to the ordering clinician or representative by the Radiologist Assistant, and communication  documented in the PACS or [employer]. Aortic Atherosclerosis (ICD10-I70.0). Electronically Signed   By: Delanna Ahmadi M.D.   On: 08/26/2022 16:35     Additional studies/ records that were reviewed today include:   Emergent cardiac catheterization/PCI 08/04/2017: Conclusion     LV end diastolic pressure is mildly elevated. The left ventricular systolic function is normal. A STENT RESOLUTE [id number] drug eluting stent was successfully placed. Prox LAD to Mid LAD lesion, 100 %stenosed. Post intervention, there is a 0% residual stenosis.   Acute ST segment elevation myocardial infarction secondary to total occlusion of the LAD immediately after the takeoff of the first diagonal and septal perforating artery.  There was TIMI 0 flow.   Mild luminal irregularity of the left circumflex vessel without significant obstructive stenoses.  Dominant normal RCA.   Successful PCI to the totally occluded LAD which resulted in a long segment of occlusion and ultimately treated with PTCA and DES stenting with a Resolute onyx 3.038 mm stent postdilated to 3.25 mm with 100% occlusion being reduced to 0% and TIMI 0 flow being improved to TIMI 3 flow.   Transient heart block treated with IV atropine.   Preserved global LV contractility with subtle apical inferior focal hypocontractility.   The patient presented to [facility] emergency room at ~ 8:45 AM.  He presented to [facility] catheterization lab at approximately 10:45 AM.  During balloon time from presentation to [facility] catheterization laboratory was ~ 20 minutes.   RECOMMENDATION: The patient will continue with dual antiplatelet therapy for minimum of a year.  He will be started on high potency statin therapy, low-dose beta blocker therapy, with ultimate ACE inhibition.  Metformin will be held for 48 hours postcontrast.     03/10/2018 Nuclear Study Highlights     The left ventricular ejection fraction is mildly decreased (45-54%). Nuclear stress EF:  45%. There was no ST segment deviation noted during stress. Findings consistent with ischemia. This is a high risk study.   TID 1.3 Normalization artifact likely but appears to be both inferior and anterior ischemia Diffuse hypokinesis EF 45%      March 21, 2018 cardiac catheterization conclusion     Previously placed Prox LAD to Mid LAD stent (unknown type) is widely patent. The left ventricular systolic function is normal. LV end diastolic pressure is normal. The left ventricular ejection fraction is 50-55% by visual estimate. There is no mitral valve regurgitation.   Preserved global LV contractility with a small region of very mild residual mid anterolateral hypocontractility.  Ejection fraction 50-55%.   No significant coronary obstructive disease with evidence for widely patent mid LAD stent after the takeoff of a first diagonal vessel, normal left circumflex and normal dominant RCA.   RECOMMENDATION: Medical therapy.  Continue DAPT in this patient status post anterior ST segment MI August 2018.  Optimize blood pressure.  Continue high potency statin therapy.      NUCLEAR STUDY 10/19/2019 The left ventricular ejection fraction is normal (55-65%). Nuclear stress EF: 59%. There was no ST segment deviation noted during stress. Defect 1: There is a medium defect of mild severity present in the basal inferior and mid inferior location. Findings consistent with  ischemia. This is a low risk study.   Abnormal, low risk stress nuclear study with mild inferior ischemia.  Gated ejection fraction 59% with normal wall motion.   ASSESSMENT:    1. CAD in native artery   2. Complete heart block ([place])   3. Pacemaker   4. RBBB   5. Type 2 diabetes mellitus with complication, without long-term current use of insulin ([place])   6. Hyperlipidemia LDL goal <70   7. Nephrolithiasis     PLAN:  Mr. Patrick [name] is a 66 year old gentleman who has a history of hypertension, hyperlipidemia,  and diabetes mellitus. On 08/04/2017 he presented with an acute anterior wall ST segment elevation myocardial infarctions and was found to have total occlusion of his LAD after the first diagonal vessel.  He was found to have a long occluded segment which was successfully treated with DES stenting with a 3.038 mm Resolute DES stent.  A nuclear perfusion study in March 2019 suggested the possibility of ischemia involving both the anterior and inferior wall.  EF was 45% and there was diffuse hypokinesis.  With his blood pressure elevation, and nuclear study suggestive of ischemia, I added amlodipine 5 mg to his regimen and further titrated metoprolol to 37.5 mg twice a day.  He underwent definitive repeat cardiac catheterization on 03/21/2018 which revealed a widely patent mid LAD stent and a normal left circumflex and dominant RCA.  He subsequently experienced recurrent chest pain symptomatology with plus minus atypical features.  A subsequent nuclear perfusion study was low risk and raised concern for possible mild inferior ischemia.  I suspect this most likely is diaphragmatic attenuation.  At his last catheterization his RCA and circumflex vessels were entirely normal.  He had normal perfusion in the LAD territory and at last cath his LAD stent was widely patent.  Remotely, he had developed complete heart block with marked bradycardia requiring initial temporary pacemaker but heart rhythm stabilized following discontinuance of beta-blocker therapy in April 2022.  He has subsequently developed recurrent complete heart block with symptomatic bradycardia and recently underwent permanent pacemaker implantation with a Biotronik dual chamber pacemaker implanted on May 17, 2022 by Dr. Crissie Sickles.  He feels improved since his pacemaker insertion.  He continues to experience some decreased energy.  He also developed a kidney stone and was scheduled to undergo removal but fortunately he self passed the stone on September 14, 2022.  He has now resumed his Plavix.  Presently, he continues to be on DAPT with aspirin/Plavix.  He is on amlodipine 5 mg, isosorbide 60 mg daily, and ranolazine 1000 mg twice a day for his CAD and hypertension.  Initial blood pressure was elevated but did improve on repeat assessment today.  He takes Myrbetriq for bladder control.  He is diabetic on metformin.  He continues to be on rosuvastatin 20 mg for hyperlipidemia.  I will see him in 6 months for reevaluation or sooner as needed.   tedication Adjustments/Labs and Tests Ordered: Current medicines are reviewed at length with the patient today.  Concerns regarding medicines are outlined above.  Medication changes, Labs and Tests ordered today are listed in the Patient Instructions below. Patient Instructions  Medication Instructions:  The current medical regimen is effective;  continue present plan and medications.  *If you need a refill on your cardiac medications before your next appointment, please call your pharmacy*   Follow-Up: At [facility], you and your health needs are our priority.  As part of our  continuing mission to provide you with exceptional heart care, we have created designated Provider Care Teams.  These Care Teams include your primary Cardiologist (physician) and Advanced Practice Providers (APPs -  Physician Assistants and Nurse Practitioners) who all work together to provide you with the care you need, when you need it.  We recommend signing up for the patient portal called "MyChart".  Sign up information is provided on this After Visit Summary.  MyChart is used to connect with patients for Virtual Visits (Telemedicine).  Patients are able to view lab/test results, encounter notes, upcoming appointments, etc.  Non-urgent messages can be sent to your provider as well.   To learn more about what you can do with MyChart, go to [email or web address].    Your next appointment:   6 month(s)  The format for  your next appointment:   In Person  Provider:   Shelva Majestic, MD            Signed, Shelva Majestic, MD  09/20/2022 8:54 PM    [facility] [address], [place], [place], [place]  [address] Phone: [phone]

## 2022-09-20 ENCOUNTER — Encounter: Payer: Self-pay | Admitting: Cardiovascular Disease

## 2022-09-21 ENCOUNTER — Encounter: Payer: Self-pay | Admitting: Cardiovascular Disease

## 2022-09-21 LAB — CALCULI, WITH PHOTOGRAPH (CLINICAL LAB)
Calcium Oxalate Monohydrate: 100 %
Weight Calculi: 242 mg

## 2022-09-21 NOTE — Addendum Note (Signed)
Addended by: Gean Birchwood on: 09/21/2022 08:21 AM   Modules accepted: Orders

## 2022-09-22 NOTE — Telephone Encounter (Signed)
Left message for pt to call.

## 2022-09-27 NOTE — Progress Notes (Unsigned)
Cardiology Clinic Note   Patient Name: Patrick [name] Date of Encounter: 09/28/2022  Primary Care Provider:  Lauro RegulusAnderson, Marshall W, MD Primary Cardiologist:  Nicki Guadalajarahomas Kelly, MD  Patient Profile    Patrick [name] 66 year old male presents to the clinic today for an evaluation of his chest tightness.  Past Medical History    Past Medical History:  Diagnosis Date   CAD in native artery    a. anterolateral STEMI 07/2017 s/p DES to prox-mid LAD, EF 55%.   GERD (gastroesophageal reflux disease)    History of kidney stones    Hyperlipidemia    Hypertension    Myocardial infarction [facility](HCC)    Overactive bladder    Presence of permanent cardiac pacemaker    Renal calculi    Syncope    Uncontrolled diabetes mellitus    Past Surgical History:  Procedure Laterality Date   CORONARY/GRAFT ACUTE MI REVASCULARIZATION N/A 08/04/2017   Procedure: Coronary/Graft Acute MI Revascularization;  Surgeon: Lennette BihariKelly, Thomas A, MD;  Location: [facility] INVASIVE CV LAB;  Service: Cardiovascular;  Laterality: N/A;   LEFT HEART CATH AND CORONARY ANGIOGRAPHY N/A 08/04/2017   Procedure: LEFT HEART CATH AND CORONARY ANGIOGRAPHY;  Surgeon: Lennette BihariKelly, Thomas A, MD;  Location: [facility] INVASIVE CV LAB;  Service: Cardiovascular;  Laterality: N/A;   LEFT HEART CATH AND CORONARY ANGIOGRAPHY N/A 03/21/2018   Procedure: LEFT HEART CATH AND CORONARY ANGIOGRAPHY;  Surgeon: Lennette BihariKelly, Thomas A, MD;  Location: [facility] INVASIVE CV LAB;  Service: Cardiovascular;  Laterality: N/A;   PACEMAKER IMPLANT N/A 05/17/2022   Procedure: PACEMAKER IMPLANT;  Surgeon: Marinus Mawaylor, Gregg W, MD;  Location: [facility] INVASIVE CV LAB;  Service: Cardiovascular;  Laterality: N/A;   PROSTATE SURGERY     in office Dr Evelene CroonWolff   TEMPORARY PACEMAKER N/A 07/14/2021   Procedure: TEMPORARY PACEMAKER;  Surgeon: Marykay LexHarding, David W, MD;  [facility];  Service: Cardiovascular;  Laterality: N/A;   TONSILLECTOMY      Allergies  No Known Allergies  History of Present Illness     Patrick [name] has a PMH of coronary artery disease status post STEMI, essential hypertension, AV block status post PPM 04/2022, RBBB, type 2 diabetes, hyperlipidemia, and hypokalemia.   His echocardiogram 05/16/2022 showed an LVEF of 55-60%, intermediate diastolic parameters, mildly enlarged right ventricle, mildly dilated left atria, trivial mitral valve regurgitation and no other significant valvular abnormalities.  He had a STEMI in 2018 and received PCI with DES to his proximal-mid LAD.  His ejection fraction was normal at that time.  His PMH also includes hyperlipidemia and syncope.   He previously had a temporary pacemaker wire placed 7/22 for high degree AV block.  His temporary pacemaker wire was able to be removed after a washout of metoprolol.  On 05/15/2022 he noted an alert on his phone which showed his heart rate at 30 bpm for a prolonged period.  He was symptomatic with dizziness and reported generally feeling bad.  He had to sit down several times.  He was given atropine in the emergency room and did not note improvement.  His EKG showed 2-1 AV block with a heart rate of 51 and chronic RBBB.  He was not noted to have any ST changes.  He was not on any rate lowering medications.  His creatinine was elevated at 1.73.   He was instructed to hold his Plavix until 05/22/2022.  He tolerated PPM placement well without complications.  He was instructed to follow-up with cardiology in 2  weeks.   He presented to the clinic 05/29/2022 for follow-up evaluation and stated he had been increasing his physical activity.  He reported that  he went to a concert at [employer].  He did note some increased chest pressure with hiking up a steep incline and carrying a chair.  We reviewed his pacemaker insertion, device interrogation, and recent episode of chest discomfort that happened 1 week after insertion.  He reported sensation of heartburn at 1-2 AM after being out for dinner.  His  device interrogation showed  an episode of SVT that happened the day after his PPM insertion.  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  His blood pressure was well controlled at 132/60.  We reviewed the importance of good blood pressure control and low-sodium diet.  I gave him the salty 6 diet sheet, had him increase his physical activity as tolerated, and planned follow-up for 3 to 4 months.  He was seen by Dr. Tresa Endo on 09/17/2022.  During that time he felt well.  He did note a decrease in his energy.  He also indicated he had an issue with kidney stones.  He was planned to undergo surgery and had been holding his Plavix.  He spontaneously passed his kidney stone on 9/25.  Surgery was aborted.  He had restarted his Plavix.  He felt improved since pacemaker insertion.  He denied anginal type symptoms.  He sent a MyChart message on 09/21/22 reporting episodes of chest discomfort in the center of his chest which were relieved quickly with rest.  He denied nitroglycerin use.   He presents the clinic today for evaluation and states that he was in the mountains this weekend.  He avoided doing strenuous hikes due to fairly immediately developed chest discomfort.  We reviewed his recent episodes and he notes that when he is doing some increased physical activity he has no chest pain.  When he is doing other physical activities and walking up inclines he will notice chest discomfort which dissipates quickly within 30 seconds with rest.  We reviewed options for treatment.  I discussed the option of increasing his Imdur medication and continuing to monitor.  At this time given that he has stable angina we will continue to monitor.  I will prescribe sublingual nitroglycerin.  I have encouraged him to increase his physical activity as tolerated.  He reports that since having his PPM he has not been as physically active as he was prior to the insertion.   We will plan follow-up in 4 months.  Today he denies chest pain, shortness of breath, lower extremity edema, fatigue, palpitations, melena, hematuria, hemoptysis, diaphoresis, weakness, presyncope, syncope, orthopnea, and PND.    Home Medications    Prior to Admission medications   Medication Sig Start Date End Date Taking? Authorizing Provider  amLODipine (NORVASC) 5 MG tablet Take 5 mg by mouth daily.    [provider]  aspirin (ASPIRIN LOW DOSE) 81 MG EC tablet Take 1 tablet (81 mg total) by mouth daily. 11/01/18   Lennette Bihari, MD  clopidogrel (PLAVIX) 75 MG tablet Take 1 tablet (75 mg total) by mouth daily. 05/22/22   Leone Brand, NP  Coenzyme Q10 200 MG capsule Take 200 mg by mouth daily.    [provider]  Cyanocobalamin (VITAMIN B-12 PO) Take by mouth.    [provider]  Glucosamine-Chondroit-Vit C-Mn (GLUCOSAMINE  1500 COMPLEX PO) Take 1,500 mg by mouth daily.    [provider]  isosorbide mononitrate (IMDUR) 60 MG 24 hr tablet Take 60 mg by mouth daily.    [provider]  Magnesium 250 MG TABS Take 250 mg by mouth daily.    [provider]  metFORMIN (GLUCOPHAGE-XR) 500 MG 24 hr tablet Take 1,000 mg by mouth 2 (two) times daily. 02/25/21   [provider]  Multiple Vitamins-Minerals (ABC PLUS SENIOR PO) Take 1 tablet by mouth daily.    [provider]  MYRBETRIQ 25 MG TB24 tablet TAKE 1 TABLET(25 MG) BY MOUTH DAILY Patient taking differently: Take 25 mg by mouth every evening. 12/25/21   Michiel Cowboy A, PA-C  nitroGLYCERIN (NITROSTAT) 0.4 MG SL tablet Place 1 tablet (0.4 mg total) under the tongue every 5 (five) minutes x 3 doses as needed for chest pain. 07/25/21   Lennette Bihari, MD  nystatin cream (MYCOSTATIN) Apply 1 application. topically daily as needed for dry skin. 11/12/21   [provider]  Omega-3 Fatty Acids (FISH OIL OMEGA-3 PO) Take 2 capsules by mouth 2 (two) times daily.      [provider]  pantoprazole (PROTONIX) 40 MG tablet Take 40 mg by mouth daily.    [provider]  Potassium 99 MG TABS Take 99 mg by mouth daily.    [provider]  pyridOXINE (VITAMIN B-6) 50 MG tablet Take 50 mg by mouth daily.    [provider]  ranolazine (RANEXA) 1000 MG SR tablet Take 1 tablet (1,000 mg total) by mouth 2 (two) times daily. 07/02/22   Lennette Bihari, MD  rosuvastatin (CRESTOR) 20 MG tablet Take 20 mg by mouth at bedtime.    [provider]  SEMAGLUTIDE PO Take 1 tablet by mouth daily.    [provider]  tamsulosin (FLOMAX) 0.4 MG CAPS capsule Take 2 capsules (0.8 mg total) by mouth daily. Patient taking differently: Take 0.8 mg by mouth every evening. 10/21/21   Michiel Cowboy A, PA-C  triamcinolone cream (KENALOG) 0.1 % Apply 1 application. topically daily as needed (rash). 11/12/21   [provider]  vitamin C (ASCORBIC ACID) 500 MG tablet Take 500 mg by mouth daily.    [provider]    Family History    Family History  Problem Relation Age of Onset   Dementia Mother    Heart disease Father    Heart attack Father    He indicated that his mother is deceased. He indicated that his father is deceased.  Social History    Social History   Socioeconomic History   Marital status: Married    Spouse name: Not on file   Number of children: 3   Years of education: GED   Highest education level: Not on file  Occupational History   Occupation: Editor, commissioning  Tobacco Use   Smoking status: Former    Types: Cigarettes   Smokeless tobacco: Never   Tobacco comments:    Quit smoking at age 76.  Vaping Use   Vaping Use: Never used  Substance and Sexual Activity   Alcohol use: Yes    Alcohol/week: 2.0 standard drinks of alcohol    Types: 2 Shots of liquor per week   Drug use: No   Sexual activity: Not on file  Other Topics Concern   Not on file  Social History Narrative   Lives at  home with his wife.   Right-handed.  Caffeine use: 3.5 cups per day.   Social Determinants of Health   Financial Resource Strain: Not on file  Food Insecurity: Not on file  Transportation Needs: Not on file  Physical Activity: Not on file  Stress: Not on file  Social Connections: Not on file  Intimate Partner Violence: Not on file     Review of Systems    General:  No chills, fever, night sweats or weight changes.  Cardiovascular:  No chest pain, dyspnea on exertion, edema, orthopnea, palpitations, paroxysmal nocturnal dyspnea. Dermatological: No rash, lesions/masses Respiratory: No cough, dyspnea Urologic: No hematuria, dysuria Abdominal:   No nausea, vomiting, diarrhea, bright red blood per rectum, melena, or hematemesis Neurologic:  No visual changes, wkns, changes in mental status. All other systems reviewed and are otherwise negative except as noted above.  Physical Exam    VS:  BP 128/64   Pulse 87   Ht 6' (1.829 m)   Wt 222 lb 3.2 oz (100.8 kg)   SpO2 90%   BMI 30.14 kg/m  , BMI Body mass index is 30.14 kg/m. GEN: Well nourished, well developed, in no acute distress. HEENT: normal. Neck: Supple, no JVD, carotid bruits, or masses. Cardiac: RRR, no murmurs, rubs, or gallops. No clubbing, cyanosis, edema.  Radials/DP/PT 2+ and equal bilaterally.  Respiratory:  Respirations regular and unlabored, clear to auscultation bilaterally. GI: Soft, nontender, nondistended, BS + x 4. MS: no deformity or atrophy. Skin: warm and dry, no rash. Neuro:  Strength and sensation are intact. Psych: Normal affect.  Accessory Clinical Findings    Recent Labs: 05/15/2022: ALT 40; TSH 0.732 05/16/2022: BUN 20; Creatinine, Ser 1.30; Hemoglobin 12.9; Platelets 169; Potassium 3.6; Sodium 137   Recent Lipid Panel    Component Value Date/Time   CHOL 79 07/14/2021 1159   TRIG 89 07/14/2021 1159   HDL 27 (L) 07/14/2021 1159   CHOLHDL 2.9 07/14/2021 1159   VLDL 18 07/14/2021 1159    LDLCALC 34 07/14/2021 1159         ECG personally reviewed by me today- none today.  Echocardiogram 05/16/2022   IMPRESSIONS     1. Left ventricular ejection fraction, by estimation, is 55 to 60%. The  left ventricle has normal function. The left ventricle has no regional  wall motion abnormalities. Left ventricular diastolic parameters are  indeterminate.   2. Right ventricular systolic function is normal. The right ventricular  size is mildly enlarged. There is normal pulmonary artery systolic  pressure.   3. Left atrial size was mildly dilated.   4. The mitral valve is normal in structure. Trivial mitral valve  regurgitation. No evidence of mitral stenosis.   5. The aortic valve is grossly normal. Aortic valve regurgitation is not  visualized. No aortic stenosis is present.   6. The inferior vena cava is normal in size with greater than 50%  respiratory variability, suggesting right atrial pressure of 3 mmHg.   Comparison(s): No significant change from prior study.   Assessment & Plan   1.  Chest discomfort-no chest pain today.  Recently contacted cardiology clinic with complaints of pain in the center of his chest with increased physical activity.  He reported that his chest discomfort would subside quickly with rest.  Appears to be stable angina. Continue to monitor  Coronary artery disease-no chest discomfort today.  Status post STEMI 2019 with PCI and DES to his mid-proximal LAD. Continue amlodipine, aspirin, co-Q10,  losartan, HCTZ, nitroglycerin, rosuvastatin  Imdur  Heart healthy low-sodium diet-salty  6 given Increase physical activity as tolerated  Sub lingual nitro  Hyperlipidemia-07/14/2021: Cholesterol 79; HDL 27; LDL Cholesterol 34; Triglycerides 89; VLDL 18 Continue co-Q10, aspirin, rosuvastatin Heart healthy low-sodium high-fiber diet Increase physical activity as tolerated    Symptomatic bradycardia-status post PPM 05/17/2022.   Pacemaker site healing  well no signs of infection.  Did note 1 episode of atypical chest discomfort that he noticed at 1-2 AM which he felt was heartburn.  His pacemaker was interrogated today and showed an episode of SVT the day after his pacemaker was inserted. Heart healthy low-sodium diet Increase physical activity as tolerated Follows with EP  Syncope-does note dizziness, dizziness sensation.  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  Continues to have mild symptoms that dissipate throughout the day.   Maintain p.o. hydration Heart healthy low-sodium diet-salty 6 given Increase physical activity as tolerated   Disposition: Follow-up with Dr. Tresa Endo in 3-4 months.   Thomasene Ripple. Danile Trier NP-C     09/28/2022, 8:41 AM [facility] [address] Office [phone] Fax [phone]  Notice: This dictation was prepared with Dragon dictation along with smaller phrase technology. Any transcriptional errors that result from this process are unintentional and may not be corrected upon review.  I spent 13 minutes examining this patient, reviewing medications, and using patient centered shared decision making involving her cardiac care.  Prior to her visit I spent greater than 20 minutes reviewing her past medical history,  medications, and prior cardiac tests.

## 2022-09-28 ENCOUNTER — Ambulatory Visit: Payer: Medicare Other | Attending: General Practice | Admitting: General Practice

## 2022-09-28 ENCOUNTER — Encounter: Payer: Self-pay | Admitting: General Practice

## 2022-09-28 VITALS — BP 128/64 | HR 87 | Ht 72.0 in | Wt 222.2 lb

## 2022-09-28 DIAGNOSIS — R079 Chest pain, unspecified: Secondary | ICD-10-CM | POA: Diagnosis not present

## 2022-09-28 DIAGNOSIS — I2510 Atherosclerotic heart disease of native coronary artery without angina pectoris: Secondary | ICD-10-CM | POA: Insufficient documentation

## 2022-09-28 DIAGNOSIS — R55 Syncope and collapse: Secondary | ICD-10-CM | POA: Insufficient documentation

## 2022-09-28 DIAGNOSIS — E785 Hyperlipidemia, unspecified: Secondary | ICD-10-CM | POA: Diagnosis not present

## 2022-09-28 DIAGNOSIS — R001 Bradycardia, unspecified: Secondary | ICD-10-CM | POA: Diagnosis not present

## 2022-09-28 MED ORDER — NITROGLYCERIN 0.4 MG SL SUBL: 0.4000 mg | SUBLINGUAL_TABLET | SUBLINGUAL | 0 refills | Status: DC | PRN

## 2022-09-28 NOTE — Patient Instructions (Addendum)
Medication Instructions:  The current medical regimen is effective;  continue present plan and medications as directed. Please refer to the Current Medication list given to you today.  *If you need a refill on your cardiac medications before your next appointment, please call your pharmacy*   Lab Work: NONE  Testing/Procedures: NONE   Follow-Up: At [facility], you and your health needs are our priority.  As part of our continuing mission to provide you with exceptional heart care, we have created designated Provider Care Teams.  These Care Teams include your primary Cardiologist (physician) and Advanced Practice Providers (APPs -  Physician Assistants and Nurse Practitioners) who all work together to provide you with the care you need, when you need it.  Your next appointment:   4 month(s)  The format for your next appointment:   In Person  Provider:   Nicki Guadalajara, MD  or Edd Fabian, FNP        Other Instructions PLEASE READ AND FOLLOW GERD DIET-ATTACHED  INCREASE PHYSICAL ACTIVITY AS TOLERATED  Important Information About Sugar       Food Choices for Gastroesophageal Reflux Disease, Adult When you have gastroesophageal reflux disease (GERD), the foods you eat and your eating habits are very important. Choosing the right foods can help ease your discomfort. Think about working with a food expert (dietitian) to help you make good choices. What are tips for following this plan? Reading food labels Look for foods that are low in saturated fat. Foods that may help with your symptoms include: Foods that have less than 5% of daily value (DV) of fat. Foods that have 0 grams of trans fat. Cooking Do not fry your food. Cook your food by baking, steaming, grilling, or broiling. These are all methods that do not need a lot of fat for cooking. To add flavor, try to use herbs that are low in spice and acidity. Meal planning  Choose healthy foods that are low in fat,  such as: Fruits and vegetables. Whole grains. Low-fat dairy products. Lean meats, fish, and poultry. Eat small meals often instead of eating 3 large meals each day. Eat your meals slowly in a place where you are relaxed. Avoid bending over or lying down until 2-3 hours after eating. Limit high-fat foods such as fatty meats or fried foods. Limit your intake of fatty foods, such as oils, butter, and shortening. Avoid the following as told by your doctor: Foods that cause symptoms. These may be different for different people. Keep a food diary to keep track of foods that cause symptoms. Alcohol. Drinking a lot of liquid with meals. Eating meals during the 2-3 hours before bed. Lifestyle Stay at a healthy weight. Ask your doctor what weight is healthy for you. If you need to lose weight, work with your doctor to do so safely. Exercise for at least 30 minutes on 5 or more days each week, or as told by your doctor. Wear loose-fitting clothes. Do not smoke or use any products that contain nicotine or tobacco. If you need help quitting, ask your doctor. Sleep with the head of your bed higher than your feet. Use a wedge under the mattress or blocks under the bed frame to raise the head of the bed. Chew sugar-free gum after meals. What foods should eat?  Eat a healthy, well-balanced diet of fruits, vegetables, whole grains, low-fat dairy products, lean meats, fish, and poultry. Each person is different. Foods that may cause symptoms in one person may  not cause any symptoms in another person. Work with your doctor to find foods that are safe for you. The items listed above may not be a complete list of what you can eat and drink. Contact a food expert for more options. What foods should I avoid? Limiting some of these foods may help in managing the symptoms of GERD. Everyone is different. Talk with a food expert or your doctor to help you find the exact foods to avoid, if any. Fruits Any fruits  prepared with added fat. Any fruits that cause symptoms. For some people, this may include citrus fruits, such as oranges, grapefruit, pineapple, and lemons. Vegetables Deep-fried vegetables. [place] fries. Any vegetables prepared with added fat. Any vegetables that cause symptoms. For some people, this may include tomatoes and tomato products, chili peppers, onions and garlic, and horseradish. Grains Pastries or quick breads with added fat. Meats and other proteins High-fat meats, such as fatty beef or pork, hot dogs, ribs, ham, sausage, salami, and bacon. Fried meat or protein, including fried fish and fried chicken. Nuts and nut butters, in large amounts. Dairy Whole milk and chocolate milk. Sour cream. Cream. Ice cream. Cream cheese. Milkshakes. Fats and oils Butter. Margarine. Shortening. Ghee. Beverages Coffee and tea, with or without caffeine. Carbonated beverages. Sodas. Energy drinks. Fruit juice made with acidic fruits, such as orange or grapefruit. Tomato juice. Alcoholic drinks. Sweets and desserts Chocolate and cocoa. Donuts. Seasonings and condiments Pepper. Peppermint and spearmint. Added salt. Any condiments, herbs, or seasonings that cause symptoms. For some people, this may include curry, hot sauce, or vinegar-based salad dressings. The items listed above may not be a complete list of what you should not eat and drink. Contact a food expert for more options. Questions to ask your doctor Diet and lifestyle changes are often the first steps that are taken to manage symptoms of GERD. If diet and lifestyle changes do not help, talk with your doctor about taking medicines. Where to find more information International Foundation for Gastrointestinal Disorders: aboutgerd.org Summary When you have GERD, food and lifestyle choices are very important in easing your symptoms. Eat small meals often instead of 3 large meals a day. Eat your meals slowly and in a place where you are  relaxed. Avoid bending over or lying down until 2-3 hours after eating. Limit high-fat foods such as fatty meats or fried foods. This information is not intended to replace advice given to you by your health care provider. Make sure you discuss any questions you have with your health care provider. Document Revised: 06/17/2020 Document Reviewed: 06/17/2020 Elsevier Patient Education  [employer].

## 2022-10-12 ENCOUNTER — Telehealth: Payer: Self-pay | Admitting: Urology

## 2022-10-12 NOTE — Telephone Encounter (Signed)
Pt is not sure why we thought Dr [name] was going to order his PSA at [facility] on 11/7 prior to his appt w/Shannon on 11/9.  He wants to know if WE can contact Dr Anderon's office and have him add PSA.  He leaves for [place] and will be out of the country.

## 2022-10-29 ENCOUNTER — Ambulatory Visit (INDEPENDENT_AMBULATORY_CARE_PROVIDER_SITE_OTHER): Payer: Medicare Other | Admitting: Urology

## 2022-10-29 ENCOUNTER — Ambulatory Visit: Payer: Medicare Other | Admitting: Urology

## 2022-10-29 VITALS — BP 103/69 | HR 99 | Ht 72.0 in | Wt 215.0 lb

## 2022-10-29 DIAGNOSIS — N529 Male erectile dysfunction, unspecified: Secondary | ICD-10-CM | POA: Diagnosis not present

## 2022-10-29 DIAGNOSIS — N138 Other obstructive and reflux uropathy: Secondary | ICD-10-CM

## 2022-10-29 DIAGNOSIS — I2510 Atherosclerotic heart disease of native coronary artery without angina pectoris: Secondary | ICD-10-CM

## 2022-10-29 DIAGNOSIS — N401 Enlarged prostate with lower urinary tract symptoms: Secondary | ICD-10-CM

## 2022-10-29 DIAGNOSIS — R351 Nocturia: Secondary | ICD-10-CM | POA: Diagnosis not present

## 2022-10-29 DIAGNOSIS — R350 Frequency of micturition: Secondary | ICD-10-CM

## 2022-10-29 MED ORDER — MIRABEGRON ER 25 MG PO TB24: 25.0000 mg | ORAL_TABLET | Freq: Every day | ORAL | 3 refills | Status: DC

## 2022-10-29 MED ORDER — TAMSULOSIN HCL 0.4 MG PO CAPS: 0.8000 mg | ORAL_CAPSULE | Freq: Every day | ORAL | 3 refills | Status: DC

## 2022-10-29 NOTE — Patient Instructions (Addendum)
Dr. Sherron Monday [phone]  Dr. Johnny Bridge [phone]  Dr. Lanetta Inch [phone]

## 2022-10-29 NOTE — Progress Notes (Signed)
10/29/2022 2:00 PM   Patrick [name] [birth year] [phone]  Referring provider: Kirk Ruths, MD [facility] [place],  [place] [address]  Urological history: 1. Elevated PSA - biopsy 2004 iPSA 6.8 negative - biopsy 2008 iPSA 6.3 negative - PSA Trend -PSA 3.87 in 09/2021   Prostate Specific Ag, Serum  Latest Ref Rng 0.0 - 4.0 ng/mL  03/22/2018 3.5   09/26/2019 3.2   10/15/2020 4.1 (H)   12/26/2020 4.5 (H)   05/08/2021 3.1   04/20/2022 3.8     Legend: (H) High  -PSA (10/2022) 3.95  2. BPH with LU TS -~15 years ago TUMT - I PSS 13/2 - Managed with tamsulosin 0.4 mg twice daily  3. Pelvic floor dysfunction - documented increased EMG activity with all voids from urodynamics in 2018 - underwent PT  4. Nocturia - managed with Myrbetriq 25 mg daily - not interested in sleep study  5. ED -contributing factors of age, CAD, diabetes, HLD, HTN and former smoker -using alternative measures for sexual activity  -SHIM 7  6. Nephrolithiasis -stone composition 100% calcium oxalate -spontaneous passage of 8 mm left UVJ stone (08/2022)   Benign Prostatic Hypertrophy and Erectile Dysfunction    HPI: Patrick [name] is a 66 y.o. male who presents today for yearly follow up.    He is having baseline frequency and intermittency which is controlled with tamsulosin 0.4 mg twice daily and Myrbetriq 25 mg daily.   Patient denies any modifying or aggravating factors.  Patient denies any gross hematuria, dysuria or suprapubic/flank pain.  Patient denies any fevers, chills, nausea or vomiting.    I PSS 13/2   IPSS     Row Name 10/29/22 1500         International Prostate Symptom Score   How often have you had the sensation of not emptying your bladder? Less than half the time     How often have you had to urinate less than every two hours? About half the time     How often have you found you stopped and started again several times when you urinated?  About half the time     How often have you found it difficult to postpone urination? Less than half the time     How often have you had a weak urinary stream? Less than 1 in 5 times     How often have you had to strain to start urination? Not at All     How many times did you typically get up at night to urinate? 2 Times     Total IPSS Score 13       Quality of Life due to urinary symptoms   If you were to spend the rest of your life with your urinary condition just the way it is now how would you feel about that? Mostly Satisfied              Score:  1-7 Mild 8-19 Moderate 20-35 Severe   SHIM 7  Patient is not having spontaneous erections.  He denies any pain or curvature with erections.  He is using Trimix on occasion.  He states the erections are mostly non-existence.  PDE5i's did not allow him to maintain his erections long enough for intercourse.  He also finds ICI uncomfortable and not resulting in adequate erections.     SHIM     Row Name 10/29/22 1512  SHIM: Over the last 6 months:   How do you rate your confidence that you could get and keep an erection? Very Low     When you had erections with sexual stimulation, how often were your erections hard enough for penetration (entering your partner)? Almost Never or Never     During sexual intercourse, how often were you able to maintain your erection after you had penetrated (entered) your partner? Almost Never or Never     During sexual intercourse, how difficult was it to maintain your erection to completion of intercourse? Very Difficult     When you attempted sexual intercourse, how often was it satisfactory for you? A Few Times (much less than half the time)       SHIM Total Score   SHIM 7              Score: 1-7 Severe ED 8-11 Moderate ED 12-16 Mild-Moderate ED 17-21 Mild ED 22-25 No ED   PMH: Past Medical History:  Diagnosis Date   CAD in native artery    a. anterolateral STEMI 07/2017 s/p  DES to prox-mid LAD, EF 55%.   GERD (gastroesophageal reflux disease)    History of kidney stones    Hyperlipidemia    Hypertension    Myocardial infarction [facility])    Overactive bladder    Presence of permanent cardiac pacemaker    Renal calculi    Syncope    Uncontrolled diabetes mellitus     Surgical History: Past Surgical History:  Procedure Laterality Date   CORONARY/GRAFT ACUTE MI REVASCULARIZATION N/A 08/04/2017   Procedure: Coronary/Graft Acute MI Revascularization;  Surgeon: Troy Sine, MD;  Location: [place] [facility];  Service: Cardiovascular;  Laterality: N/A;   LEFT HEART CATH AND CORONARY ANGIOGRAPHY N/A 08/04/2017   Procedure: LEFT HEART CATH AND CORONARY ANGIOGRAPHY;  Surgeon: Troy Sine, MD;  Location: [place] [facility];  Service: Cardiovascular;  Laterality: N/A;   LEFT HEART CATH AND CORONARY ANGIOGRAPHY N/A 03/21/2018   Procedure: LEFT HEART CATH AND CORONARY ANGIOGRAPHY;  Surgeon: Troy Sine, MD;  Location: [place] [facility];  Service: Cardiovascular;  Laterality: N/A;   PACEMAKER IMPLANT N/A 05/17/2022   Procedure: PACEMAKER IMPLANT;  Surgeon: Evans Lance, MD;  Location: [place] [facility];  Service: Cardiovascular;  Laterality: N/A;   PROSTATE SURGERY     in office Dr Yves Dill   TEMPORARY PACEMAKER N/A 07/14/2021   Procedure: TEMPORARY PACEMAKER;  Surgeon: Leonie Man, MD;  Location: [place] [facility];  Service: Cardiovascular;  Laterality: N/A;   TONSILLECTOMY      Home Medications:  Allergies as of 10/29/2022   No Known Allergies      Medication List        Accurate as of October 29, 2022 11:59 PM. If you have any questions, ask your nurse or doctor.          ABC PLUS SENIOR PO Take 1 tablet by mouth daily.   amLODipine 5 MG tablet Commonly known as: NORVASC Take 5 mg by mouth daily.   ascorbic acid 500 MG tablet Commonly known as: VITAMIN C Take 500 mg by mouth daily.   aspirin EC 81 MG tablet Commonly known  as: Aspirin Low Dose Take 1 tablet (81 mg total) by mouth daily.   clopidogrel 75 MG tablet Commonly known as: PLAVIX Take 1 tablet (75 mg total) by mouth daily.   Coenzyme Q10 200 MG capsule Take 200 mg by mouth  daily.   FISH OIL OMEGA-3 PO Take 2 capsules by mouth 2 (two) times daily.   GLUCOSAMINE 1500 COMPLEX PO Take 1,500 mg by mouth daily.   isosorbide mononitrate 60 MG 24 hr tablet Commonly known as: IMDUR Take 60 mg by mouth daily.   Magnesium 250 MG Tabs Take 250 mg by mouth daily.   metFORMIN 500 MG 24 hr tablet Commonly known as: GLUCOPHAGE-XR Take 1,000 mg by mouth 2 (two) times daily.   mirabegron ER 25 MG Tb24 tablet Commonly known as: MYRBETRIQ Take 1 tablet (25 mg total) by mouth daily. What changed: See the new instructions. Changed by: Zara Council, PA-C   nitroGLYCERIN 0.4 MG SL tablet Commonly known as: NITROSTAT Place 1 tablet (0.4 mg total) under the tongue every 5 (five) minutes x 3 doses as needed for chest pain.   nystatin cream Commonly known as: MYCOSTATIN Apply 1 application. topically daily as needed for dry skin.   pantoprazole 40 MG tablet Commonly known as: PROTONIX Take 40 mg by mouth daily.   Potassium 99 MG Tabs Take 99 mg by mouth daily.   pyridOXINE 50 MG tablet Commonly known as: VITAMIN B6 Take 50 mg by mouth daily.   ranolazine 1000 MG SR tablet Commonly known as: RANEXA Take 1 tablet (1,000 mg total) by mouth 2 (two) times daily.   rosuvastatin 20 MG tablet Commonly known as: CRESTOR Take 20 mg by mouth at bedtime.   SEMAGLUTIDE PO Take 1 tablet by mouth daily.   tamsulosin 0.4 MG Caps capsule Commonly known as: FLOMAX Take 2 capsules (0.8 mg total) by mouth daily. What changed: when to take this   triamcinolone cream 0.1 % Commonly known as: KENALOG Apply 1 application. topically daily as needed (rash).   VITAMIN B-12 PO Take by mouth.        Allergies: No Known Allergies  Family  History: Family History  Problem Relation Age of Onset   Dementia Mother    Heart disease Father    Heart attack Father     Social History:  reports that he has quit smoking. His smoking use included cigarettes. He has never used smokeless tobacco. He reports current alcohol use of about 2.0 standard drinks of alcohol per week. He reports that he does not use drugs.  ROS: For pertinent review of systems please refer to history of present illness  Physical Exam: BP 103/69   Pulse 99   Ht 6' (1.829 m)   Wt 215 lb (97.5 kg)   BMI 29.16 kg/m   Constitutional:  Well nourished. Alert and oriented, No acute distress. HEENT: [place] AT, moist mucus membranes.  Trachea midline Cardiovascular: No clubbing, cyanosis, or edema. Respiratory: Normal respiratory effort, no increased work of breathing. GU: No CVA tenderness.  No bladder fullness or masses.  Patient with circumcised phallus.  Urethral meatus is patent.  No penile discharge. No penile lesions or rashes. Scrotum without lesions, cysts, rashes and/or edema.  Testicles are located scrotally bilaterally. No masses are appreciated in the testicles. Left and right epididymis are normal. Rectal: Patient with  normal sphincter tone. Anus and perineum without scarring or rashes. No rectal masses are appreciated. Prostate is approximately 50 grams, no nodules are appreciated. Seminal vesicles could not be palpated  Neurologic: Grossly intact, no focal deficits, moving all 4 extremities. Psychiatric: Normal mood and affect.   Laboratory Data: Glucose 70 - 110 mg/dL 143 High   Sodium 136 - 145 mmol/L 141  Potassium 3.6 - 5.1  mmol/L 4.1  Chloride 97 - 109 mmol/L 105  Carbon Dioxide (CO2) 22.0 - 32.0 mmol/L 29.7  Urea Nitrogen (BUN) 7 - 25 mg/dL 16  Creatinine 0.7 - 1.3 mg/dL 1.1  Glomerular Filtration Rate (eGFR) >60 mL/min/1.73sq m 73  Comment: CKD-EPI (2021) does not include patient's race in the calculation of eGFR.  Monitoring changes of  plasma creatinine and eGFR over time is useful for monitoring kidney function.  Interpretive Ranges for eGFR (CKD-EPI 2021):  eGFR:       >60 mL/min/1.73 sq. m - Normal eGFR:       30-59 mL/min/1.73 sq. m - Moderately Decreased eGFR:       15-29 mL/min/1.73 sq. m  - Severely Decreased eGFR:       < 15 mL/min/1.73 sq. m  - Kidney Failure   Note: These eGFR calculations do not apply in acute situations when eGFR is changing rapidly or patients on dialysis.  Calcium 8.7 - 10.3 mg/dL 9.0  AST 8 - 39 U/L 21  ALT 6 - 57 U/L 29  Alk Phos (alkaline Phosphatase) 34 - 104 U/L 43  Albumin 3.5 - 4.8 g/dL 4.3  Bilirubin, Total 0.3 - 1.2 mg/dL 0.6  Protein, Total 6.1 - 7.9 g/dL 6.3  A/G Ratio 1.0 - 5.0 gm/dL 2.2  [place] - LAB   Specimen Collected: 10/27/22 08:20   Performed by: [place]: 10/27/22 11:05  Received From: [facility]  Result Received: 10/27/22 11:14   Cholesterol, Total 100 - 200 mg/dL 91 Low   Triglyceride 35 - 199 mg/dL 125  HDL (High Density Lipoprotein) Cholesterol 29.0 - 71.0 mg/dL 35.9  LDL Calculated 0 - 130 mg/dL 30  VLDL Cholesterol mg/dL 25  Cholesterol/HDL Ratio  2.5  Resulting Agency  [place] [facility] - LAB   Specimen Collected: 10/27/22 08:20   Performed by: [place]: 10/27/22 11:05  Received From: [place]  Result Received: 10/27/22 11:14   Hemoglobin A1C 4.2 - 5.6 % 6.4 High   Average Blood Glucose (Calc) mg/dL [place]  Narrative Performed by [facility] - LAB Normal Range:    4.2 - 5.6% Increased Risk:  5.7 - 6.4% Diabetes:        >= 6.5% Glycemic Control for adults with diabetes:  <7%    Specimen Collected: 10/27/22 08:20   Performed by: [place]: 10/27/22 10:39  Received From: [place]  Result Received: 10/27/22  11:14   PSA (Prostate Specific Antigen), Total 0.10 - 4.00 ng/mL 3.95  Resulting Agency  [facility] - LAB  Narrative Performed by [facility] - LAB Test results were determined with Beckman Coulter Hybritech Assay. Values obtained with different assay methods cannot be used interchangeably in serial testing. Assay results should not be interpreted as absolute evidence of the presence or absence of malignant disease  Specimen Collected: 10/27/22 08:20   Performed by: [place]: 10/27/22 10:31  Received From: [place]  Result Received: 10/27/22 11:14  I have reviewed the labs.   Pertinent Imaging N/A  Assessment & Plan:    1. BPH with LUTS -PSA stable  -DRE benign -UA benign  -symptoms - frequency -continue conservative management, avoiding bladder irritants and timed voiding's -Continue tamsulosin 0.4 mg twice daily  2.  Nocturia -deferred sleep study -managed with Myrbetriq 25 mg nightly  3. Erectile dysfunction -using alternative methods for intimacy      Return in about 1 year (around 10/30/2023) for IPSS, SHIM, PSA, PVR and exam.  Zara Council, [place] [address], [place], [place] [address] [phone]

## 2022-11-01 ENCOUNTER — Encounter: Payer: Self-pay | Admitting: Urology

## 2022-11-18 ENCOUNTER — Ambulatory Visit (INDEPENDENT_AMBULATORY_CARE_PROVIDER_SITE_OTHER): Payer: Medicare Other

## 2022-11-18 DIAGNOSIS — I442 Atrioventricular block, complete: Secondary | ICD-10-CM

## 2022-11-20 LAB — CUP PACEART REMOTE DEVICE CHECK
Date Time Interrogation Session: 20231130073343
Implantable Lead Connection Status: 753985
Implantable Lead Connection Status: 753985
Implantable Lead Implant Date: 20230528
Implantable Lead Implant Date: 20230528
Implantable Lead Location: 753859
Implantable Lead Location: 753860
Implantable Lead Model: 377169
Implantable Lead Model: 377171
Implantable Lead Serial Number: 8000368134
Implantable Lead Serial Number: 8000749513
Implantable Pulse Generator Implant Date: 20230528
Pulse Gen Model: 407145
Pulse Gen Serial Number: 70380012

## 2022-12-07 ENCOUNTER — Encounter: Payer: Self-pay | Admitting: Cardiovascular Disease

## 2022-12-15 ENCOUNTER — Other Ambulatory Visit: Payer: Self-pay | Admitting: Cardiovascular Disease

## 2022-12-15 NOTE — Progress Notes (Signed)
Remote pacemaker transmission.   

## 2023-02-02 ENCOUNTER — Ambulatory Visit: Payer: Medicare Other | Attending: Cardiovascular Disease | Admitting: Cardiovascular Disease

## 2023-02-02 ENCOUNTER — Encounter: Payer: Self-pay | Admitting: Cardiovascular Disease

## 2023-02-02 VITALS — BP 142/78 | HR 77 | Ht 72.0 in | Wt 222.4 lb

## 2023-02-02 DIAGNOSIS — Z950 Presence of cardiac pacemaker: Secondary | ICD-10-CM | POA: Diagnosis present

## 2023-02-02 DIAGNOSIS — I2510 Atherosclerotic heart disease of native coronary artery without angina pectoris: Secondary | ICD-10-CM | POA: Insufficient documentation

## 2023-02-02 DIAGNOSIS — I4510 Unspecified right bundle-branch block: Secondary | ICD-10-CM | POA: Diagnosis present

## 2023-02-02 DIAGNOSIS — I10 Essential (primary) hypertension: Secondary | ICD-10-CM | POA: Insufficient documentation

## 2023-02-02 DIAGNOSIS — I442 Atrioventricular block, complete: Secondary | ICD-10-CM | POA: Diagnosis present

## 2023-02-02 DIAGNOSIS — E118 Type 2 diabetes mellitus with unspecified complications: Secondary | ICD-10-CM | POA: Insufficient documentation

## 2023-02-02 DIAGNOSIS — G4733 Obstructive sleep apnea (adult) (pediatric): Secondary | ICD-10-CM | POA: Insufficient documentation

## 2023-02-02 DIAGNOSIS — E785 Hyperlipidemia, unspecified: Secondary | ICD-10-CM | POA: Diagnosis present

## 2023-02-02 NOTE — Patient Instructions (Signed)
Medication Instructions:  NO CHANGES  *If you need a refill on your cardiac medications before your next appointment, please call your pharmacy*    Follow-Up: At [facility], you and your health needs are our priority.  As part of our continuing mission to provide you with exceptional heart care, we have created designated Provider Care Teams.  These Care Teams include your primary Cardiologist (physician) and Advanced Practice Providers (APPs -  Physician Assistants and Nurse Practitioners) who all work together to provide you with the care you need, when you need it.  We recommend signing up for the patient portal called "MyChart".  Sign up information is provided on this After Visit Summary.  MyChart is used to connect with patients for Virtual Visits (Telemedicine).  Patients are able to view lab/test results, encounter notes, upcoming appointments, etc.  Non-urgent messages can be sent to your provider as well.   To learn more about what you can do with MyChart, go to [email or web address].    Your next appointment:    6-7 months with Dr. Claiborne Billings

## 2023-02-02 NOTE — Progress Notes (Signed)
Cardiology Office Note    Date:  02/08/2023   ID:  Patrick [name], DOB [birth year], MRN [id number]  PCP:  Kirk Ruths, MD  Cardiologist:  Shelva Majestic, MD    History of Present Illness:  Patrick [name] is a 67 y.o. male who suffered an anterior ST segment elevation myocardial infarction on 08/04/2017. I  last saw him in September 2023.  He presents for 39-monthfollow-up cardiology evaluation.  Mr. KHuffineshas a history of hypertension, hyperlipidemia, and diabetes mellitus.  He was admitted on 08/04/2017 with ST segment elevation anterolaterally and a code STEMI was activated.  I performed emergent cardiac catheterization which revealed total occlusion of his LAD after the first diagonal vessel.  There was a long segment of occlusion and ultimately a Resolute on a 3.038 mm stent was inserted, postdilated 3.25 mm with 100% occlusion being reduced to 0% and TIMI 0 flow been improved to TIMI 3 flow.  He developed transient heart block during the procedure which required IV atropine.  Of note, his lipid panel during that evaluation was consistent with an atherogenic dyslipidemic pattern with triglycerides of 370, VLDL 74, low HDL at 29, and his total cholesterol was 204 with LDL 101.  He was started on atorvastatin and also was on fenofibrate.  Subsequent, please fenofibrate was discontinued by his primary physician and he has been taking over-the-counter fish oil.  Repeat blood work 1 month later by his primary physician showed a total cholesterol 124, LDL 47, triglycerides were 251, HDL was 26.5.  Over the past several months, he has done well.  He denies any definitive recurrent anginal symptomatology.  He had experienced 1 vague episode of  similar sensation after working 16 hours a day for 3 days and having to go to [place] trip in [place]    When I  saw him he had noticed some occasional episodes of heartburn sensation, but also has noted this with mild exertion.  He was seen by  Patrick [name] on 02/28/2018.  Because of worsening heartburn symptoms and fatigue, which seemed nitrate responsive she recommended a follow-up nuclear perfusion study for reassessment.  His nuclear study  on 03/10/2018 was interpreted as a high risk study suggested ischemia involving both the inferior as well as anterior wall.  There was diffuse hypokinesis.  EF was 45%.  When I saw him in follow-up of the nuclear study I recommended definitive cardiac catheterization.  Cardiac catheterization was done on March 21, 2018. This revealed preserved global LV contractility with a small region of very mild residual mid anterolateral hypocontractility.  Ejection fraction was 50-55%.  The stent in the mid LAD was widely patent and his circumflex and dominant RCA were normal.  Medical therapy was recommended and optimization of blood pressure.  Due to lipid studies that were significantly elevated in August 2018 I recommended the addition of his Vascepa to his atorvastatin.   I saw him in May 2019 and last saw him in October 2019.  At that time he continued to do well.  At the time, he was on amlodipine 10 mg and metoprolol 25 mg twice a day.  His resting pulse was 96 and I recommended titration of metoprolol tartrate to 37.5 mg twice a day.  [facility]: Total cholesterol was 101, HDL 33, LDL 50, triglycerides had improved to 88.  VLDL was 18.  He is enrolled in a study with Dr.  Ouida [name] which is an oral Victoza therapy.  Hemoglobin A1c before the study was 8.3 and 1 week ago repeat hemoglobin A1c was 6.8.     I evaluated him in a telemedicine visit on October 11, 2019.  At that time he had told me that 3 weeks previously he went to the beach and while walking on the beach for 10 to 15 minutes began to notice a substernal chest discomfort.  He was able to walk through it but at a slower pace.  Since that time, he has experienced some recurrent exertional chest tightness  particularly if walking fast up hills.  He has not tried nitroglycerin.  He denies any rest discomfort.  He denies any palpitations.  He denies PND orthopnea.  He had recently seen Patrick [name].  He tells me during his recent evaluation his blood pressure was 131/71 and his pulse was 68.  Laboratory revealed a total cholesterol 109, triglycerides 218, VLDL 44, HDL 29.9, and LDL 36.    During theevaluation, I recommended slight titration of meds Toprol 50 mg twice a day, continue amlodipine 10 mg daily and initiated isosorbide 30 mg.  I scheduled him to undergo a nuclear perfusion study for reevaluation and further assessment of his chest pain and potential for ischemia.  When I saw him in November 2020 he admitted to some mild recurrent chest pain episodes.  He had an episode when he was lifting a refrigerator outside and noticed discomfort.  He underwent a nuclear perfusion study on October 19, 2019.  There was a suggestion of a medium defect of mild severity in the basal inferior to mid inferior wall suggestive of possible ischemia.  He did not have any ECG changes during stress and he had normal wall motion and EF at 59% on quantitative analysis.  His chest pain has some atypical features.  He wasa been taking pantoprazole for GERD.  During that evaluation, I recommended increasing isosorbide to 60 mg daily and also suggested a trial of increasing Protonix to twice a day for the next several weeks to see if there was any improvement.  I saw him in April 2021 and over the prior 2 months he denied any major change in symptomatology.  Oftentimes he may noticed the development of some mild chest discomfort approximately 15 minutes into a walk.  He typically does not walk much longer than that so was uncertain if he was able to walk through the pain.  He denied any severe chest pain or significant dyspnea.  There was one occurrence where he had noticed a vague chest sensation at night.  He has not been successful  with weight loss.    Since his prior evaluation, he presented to [facility] ER on March 29, 2021 with dizziness.  His vital signs were normal and he had extensive imaging and was sent home.  Apparently he had several other occurrences of presyncope and had a syncopal spell on July 14, 2021 and was found to be bradycardic and hypotensive.  He presented to [facility] ER where ECG revealed complete heart block.  He underwent insertion of a temporary pacemaker.  His beta-blocker was held and conduction recovered.  He was seen by Dr. Curt Bears who opted to defer permanent pacemaker implant.  When I saw him in follow-up on July 25, 2021 he felt well and was no longer on rate control medication.  He denied any anginal symptomatology or shortness of breath.  He is still working in Building control surveyor.  His blood  pressure at home has been elevated, and he was started on losartan HCT 100/12.5 mg in place of losartan 100 mg.  He continued to be on amlodipine 10 mg, isosorbide 90 mg, continues to be on aspirin 81 mg and reduced dose of Brilinta 60 mg twice a day.  He continues to be on Ranexa 1000 mg twice a day.  He was seen by his primary physician Dr. Frazier Richards today prior to my evaluation.  During that evaluation he remained stable and had not had any episodes of chest discomfort or dizziness.  He was experiencing easy bruisability on low-dose aspirin and Brilinta 60 mg twice a day and I suggested switching to generic clopidogrel 75 mg daily once he finishes current Brilinta pills.  He was evaluated by Sande Rives in September 2022 after he had experienced another episode of dizziness on August 19, 2021.  EKG showed sinus rhythm with rates in the 80s with chronic right bundle branch block but no high-grade AV block.  At the time his blood pressure was well controlled.  I last saw him on November 18, 2021 at which time he continued to feel well and denied any dizziness over the past month.. He had been evaluated by  neurologist and was given a prescription for meclizine to take as needed.  He denied any recurrent chest pain and is able to walk without discomfort.  He has follow-up scheduled with neurology.  He also had worn a 3-day monitor by Dr. Frazier Richards which apparently did not reveal any major findings.    Since I last saw him, he developed an episode of heart block and received a notice on his phone on May 15, 2022 with heart rates in the 30s.  He was symptomatic with dizziness.  He presented to the emergency room and was given atropine.  ECG showed 2-1 AV block with heart rate of 51 chronic right bundle branch block.  Creatinine was 1.73.  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 Crissie Sickles on May 17, 2022.  Mr. Sheesley was subsequently evaluated on 02/26/2022 by Coletta Memos, [name].  At that time he felt improved he had experienced a short episode of SVT today after his pacemaker insertion.  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  I last saw him on September 17, 2022.  He felt well  but admitted to some decreased energy.  He has had issues with a kidney stone.  He was to undergo surgery and had begun to hold Plavix but he passed the "kidney stone on September 25 spontaneously and surgery was aborted.  He is now back on Plavix.  He feels improved since insertion of his pacemaker.  He denies any anginal symptoms.  Since I saw him, he was evaluated by Coletta Memos on September 28, 2022.  [place] the [place] the weekend prior to that evaluation and noticed some mild chest discomfort which dissipated quickly within 30 seconds with rest.  He was given a prescription for sublingual nitroglycerin..  Presently, Mr. Pajak has felt well.  He tells me he was recently started on CPAP therapy  followed by Patrick [name].  He has noticed improvement in his sleep pattern.  He had undergone laboratory in November 2023 and total cholesterol was 91 LDL 30 triglycerides 125 with creatinine  1.1 and glucose 143.  He denies any recent chest pain.  He is followed by Dr. Frazier Richards in [place].  He presents for evaluation.   Past Medical History:  Diagnosis Date   CAD in native artery    a. anterolateral STEMI 07/2017 s/p DES to prox-mid LAD, EF 55%.   GERD (gastroesophageal reflux disease)    History of kidney stones    Hyperlipidemia    Hypertension    Myocardial infarction [facility])    Overactive bladder    Presence of permanent cardiac pacemaker    Renal calculi    Syncope    Uncontrolled diabetes mellitus     Past Surgical History:  Procedure Laterality Date   CORONARY/GRAFT ACUTE MI REVASCULARIZATION N/A 08/04/2017   Procedure: Coronary/Graft Acute MI Revascularization;  Surgeon: Troy Sine, MD;  Location: [place] [facility];  Service: Cardiovascular;  Laterality: N/A;   LEFT HEART CATH AND CORONARY ANGIOGRAPHY N/A 08/04/2017   Procedure: LEFT HEART CATH AND CORONARY ANGIOGRAPHY;  Surgeon: Troy Sine, MD;  Location: [place] [facility];  Service: Cardiovascular;  Laterality: N/A;   LEFT HEART CATH AND CORONARY ANGIOGRAPHY N/A 03/21/2018   Procedure: LEFT HEART CATH AND CORONARY ANGIOGRAPHY;  Surgeon: Troy Sine, MD;  Location: [place] [facility];  Service: Cardiovascular;  Laterality: N/A;   PACEMAKER IMPLANT N/A 05/17/2022   Procedure: PACEMAKER IMPLANT;  Surgeon: Evans Lance, MD;  Location: [place] [facility];  Service: Cardiovascular;  Laterality: N/A;   PROSTATE SURGERY     in office Dr Yves Dill   TEMPORARY PACEMAKER N/A 07/14/2021   Procedure: TEMPORARY PACEMAKER;  Surgeon: Leonie Man, MD;  Location: [place] [facility];  Service: Cardiovascular;  Laterality: N/A;   TONSILLECTOMY      Current Medications: Outpatient Medications Prior to Visit   Medication Sig Dispense Refill   amLODipine (NORVASC) 5 MG tablet Take 5 mg by mouth daily.     aspirin (ASPIRIN LOW DOSE) 81 MG EC tablet Take 1 tablet (81 mg total) by mouth daily. 90 tablet 0   clopidogrel (PLAVIX) 75 MG tablet Take 1 tablet (75 mg total) by mouth daily. 90 tablet 3   Coenzyme Q10 200 MG capsule Take 200 mg by mouth daily.     Cyanocobalamin (VITAMIN B-12 PO) Take by mouth.     Glucosamine-Chondroit-Vit C-Mn (GLUCOSAMINE 1500 COMPLEX PO) Take 1,500 mg by mouth daily.     isosorbide mononitrate (IMDUR) 60 MG 24 hr tablet Take 60 mg by mouth daily.     Magnesium 250 MG TABS Take 250 mg by mouth daily.     metFORMIN (GLUCOPHAGE-XR) 500 MG 24 hr tablet Take 1,000 mg by mouth 2 (two) times daily.     mirabegron ER (MYRBETRIQ) 25 MG TB24 tablet Take 1 tablet (25 mg total) by mouth daily. 90 tablet 3   Multiple Vitamins-Minerals (ABC PLUS SENIOR PO) Take 1 tablet by mouth daily.     nitroGLYCERIN (NITROSTAT) 0.4 MG SL tablet Place 1 tablet (0.4 mg total) under the tongue every 5 (five) minutes x 3 doses as needed for chest pain. 25 tablet 0   nystatin cream (MYCOSTATIN) Apply 1 application. topically daily as needed for dry skin.     Omega-3 Fatty Acids (FISH OIL OMEGA-3 PO) Take 2 capsules by mouth 2 (two) times daily.      pantoprazole (PROTONIX) 40 MG tablet Take 40 mg by mouth daily.     Potassium 99 MG TABS Take 99 mg by  mouth daily.     pyridOXINE (VITAMIN B-6) 50 MG tablet Take 50 mg by mouth daily.     ranolazine (RANEXA) 1000 MG SR tablet TAKE 1 TABLET(1000 MG) BY MOUTH TWICE DAILY 180 tablet 3   rosuvastatin (CRESTOR) 20 MG tablet Take 20 mg by mouth at bedtime.     SEMAGLUTIDE PO Take 1 tablet by mouth daily.     tamsulosin (FLOMAX) 0.4 MG CAPS capsule Take 2 capsules (0.8 mg total) by mouth daily. 180 capsule 3   triamcinolone cream (KENALOG) 0.1 % Apply 1 application. topically daily as needed (rash).     vitamin C (ASCORBIC ACID) 500 MG tablet Take 500 mg by mouth  daily.     No facility-administered medications prior to visit.     Allergies:   Patient has no known allergies.   Social History   Socioeconomic History   Marital status: Married    Spouse name: Not on file   Number of children: 3   Years of education: GED   Highest education level: Not on file  Occupational History   Occupation: Energy manager  Tobacco Use   Smoking status: Former    Types: Cigarettes   Smokeless tobacco: Never   Tobacco comments:    Quit smoking at age 70.  Vaping Use   Vaping Use: Never used  Substance and Sexual Activity   Alcohol use: Yes    Alcohol/week: 2.0 standard drinks of alcohol    Types: 2 Shots of liquor per week   Drug use: No   Sexual activity: Not on file  Other Topics Concern   Not on file  Social History Narrative   Lives at home with his wife.   Right-handed.   Caffeine use: 3.5 cups per day.   Social Determinants of Health   Financial Resource Strain: Not on file  Food Insecurity: Not on file  Transportation Needs: Not on file  Physical Activity: Not on file  Stress: Not on file  Social Connections: Not on file     Family History:  The patient's family history includes Dementia in his mother; Heart attack in his father; Heart disease in his father.   ROS General: Negative; No fevers, chills, or night sweats;  HEENT: Negative; No changes in vision or hearing, sinus congestion, difficulty swallowing Pulmonary: Positive for bronchitis Cardiovascular:   See history of present illness GI: Negative; No nausea, vomiting, diarrhea, or abdominal pain GU: Negative; No dysuria, hematuria, or difficulty voiding Musculoskeletal: Negative; no myalgias, joint pain, or weakness Hematologic/Oncology: Negative; no easy bruising, bleeding Endocrine: Positive for diabetes Neuro: Negative; no changes in balance, headaches Skin: Negative; No rashes or skin lesions Psychiatric: Negative; No behavioral problems, depression Sleep: Recently  diagnosed with OSA with initiation of CPAP therapy   Other comprehensive 14 point system review is negative.   PHYSICAL EXAM:   BP (!) 142/78   Pulse 77   Ht 6' (1.829 m)   Wt 222 lb 6.4 oz (100.9 kg)   SpO2 94%   BMI 30.16 kg/m    Repeat blood pressure by me was 140/78.  Wt Readings from Last 3 Encounters:  02/02/23 222 lb 6.4 oz (100.9 kg)  10/29/22 215 lb (97.5 kg)  09/28/22 222 lb 3.2 oz (100.8 kg)     General: Alert, oriented, no distress.  Skin: normal turgor, no rashes, warm and dry HEENT: Normocephalic, atraumatic. Pupils equal round and reactive to light; sclera anicteric; extraocular muscles intact;  Nose without nasal septal hypertrophy Mouth/Parynx  benign; Mallinpatti scale 3 Neck: No JVD, no carotid bruits; normal carotid upstroke Lungs: clear to ausculatation and percussion; no wheezing or rales Chest wall: without tenderness to palpitation Heart: PMI not displaced, RRR, s1 s2 normal, 1/6 systolic murmur, no diastolic murmur, no rubs, gallops, thrills, or heaves Abdomen: soft, nontender; no hepatosplenomehaly, BS+; abdominal aorta nontender and not dilated by palpation. Back: no CVA tenderness Pulses 2+ Musculoskeletal: full range of motion, normal strength, no joint deformities Extremities: no clubbing cyanosis or edema, Homan's sign negative  Neurologic: grossly nonfocal; Cranial nerves grossly wnl Psychologic: Normal mood and affect    Studies/Labs Reviewed:   February 02, 2023 ECG (independently read by me): AV paced with prolonged AV conduction at 344 msec  September 17, 2022 ECG (independently read by me): NSR at 68, with intermittent Atrial pacing, RBBB  November 18, 2021 ECG (independently read by me):  NSR at 79, RBBB with repolarization  July 25, 2021: NSR at 85, RBBB, PR 192 msec  July 14, 2021: Complete heart block with RBBB, LPHB, ventricular rate 18 April 2020 ECG in ER (independently read by me): Normal sinus rhythm at 61 bpm.  Right  bundle branch block with repolarization changes.  Normal intervals.  No ectopy.  November 07, 2019 EKG:  EKG is ordered today.  Normal sinus rhythm at 67 bpm.  Right bundle branch block with repolarization changes  October 17 2018 ECG (independently read by me): Normal sinus rhythm at 93 bpm.  Right bundle branch block with repolarization changes.  QTc interval 44 ms.  Apr 25, 2018 ECG (independently read by me): Normal sinus rhythm at 73 bpm.  Right bundle branch block with repolarization changes.  March 16, 2018 ECG (independently read by me): NSR at 63;RBBB with repolarization changes QTc 431 msec  November 2018 ECG (independently read by me): Normal sinus rhythm at 66 bpm.  Right bundle branch block with repolarization changes.  QTc interval 448 ms.  Recent Labs:    Latest Ref Rng & Units 05/16/2022    3:24 AM 05/15/2022    4:01 PM 09/01/2021    4:28 PM  BMP  Glucose 70 - 99 mg/dL 104  152  169   BUN 8 - 23 mg/dL 20  27  23   $ Creatinine 0.61 - 1.24 mg/dL 1.30  1.73  1.59   BUN/Creat Ratio 10 - 24   14   Sodium 135 - 145 mmol/L 137  137  137   Potassium 3.5 - 5.1 mmol/L 3.6  4.7  4.2   Chloride 98 - 111 mmol/L 105  104  99   CO2 22 - 32 mmol/L 26  22  22   $ Calcium 8.9 - 10.3 mg/dL 8.8  8.7  9.5         Latest Ref Rng & Units 05/15/2022    4:01 PM 07/14/2021   11:59 AM 07/14/2021   11:08 AM  Hepatic Function  Total Protein 6.5 - 8.1 g/dL 6.3  5.2  5.3   Albumin 3.5 - 5.0 g/dL 3.9  3.0  3.1   AST 15 - 41 U/L 40  18  21   ALT 0 - 44 U/L 40  22  21   Alk Phosphatase 38 - 126 U/L 35  29  31   Total Bilirubin 0.3 - 1.2 mg/dL 1.0  1.1  0.9        Latest Ref Rng & Units 05/16/2022    3:24 AM 05/15/2022    4:01 PM  09/01/2021    4:28 PM  CBC  WBC 4.0 - 10.5 K/uL 6.3  7.5  6.3   Hemoglobin 13.0 - 17.0 g/dL 12.9  13.9  13.4   Hematocrit 39.0 - 52.0 % 35.5  39.1  39.2   Platelets 150 - 400 K/uL 169  181  228    Lab Results  Component Value Date   MCV 88.5 05/16/2022   MCV 90.1  05/15/2022   MCV 91 09/01/2021   Lab Results  Component Value Date   TSH 0.732 05/15/2022   Lab Results  Component Value Date   HGBA1C 5.9 (H) 07/14/2021     BNP No results found for: "BNP"  ProBNP No results found for: "PROBNP"   Lipid Panel     Component Value Date/Time   CHOL 79 07/14/2021 1159   TRIG 89 07/14/2021 1159   HDL 27 (L) 07/14/2021 1159   CHOLHDL 2.9 07/14/2021 1159   VLDL 18 07/14/2021 1159   LDLCALC 34 07/14/2021 1159     RADIOLOGY: No results found.   Additional studies/ records that were reviewed today include:   Emergent cardiac catheterization/PCI 08/04/2017: Conclusion     LV end diastolic pressure is mildly elevated. The left ventricular systolic function is normal. A STENT RESOLUTE [id number] drug eluting stent was successfully placed. Prox LAD to Mid LAD lesion, 100 %stenosed. Post intervention, there is a 0% residual stenosis.   Acute ST segment elevation myocardial infarction secondary to total occlusion of the LAD immediately after the takeoff of the first diagonal and septal perforating artery.  There was TIMI 0 flow.   Mild luminal irregularity of the left circumflex vessel without significant obstructive stenoses.  Dominant normal RCA.   Successful PCI to the totally occluded LAD which resulted in a long segment of occlusion and ultimately treated with PTCA and DES stenting with a Resolute onyx 3.038 mm stent postdilated to 3.25 mm with 100% occlusion being reduced to 0% and TIMI 0 flow being improved to TIMI 3 flow.   Transient heart block treated with IV atropine.   Preserved global LV contractility with subtle apical inferior focal hypocontractility.   The patient presented to [facility] emergency room at ~ 8:45 AM.  He presented to [facility] catheterization lab at approximately 10:45 AM.  During balloon time from presentation to [facility] catheterization laboratory was ~ 20 minutes.   RECOMMENDATION: The patient will continue with  dual antiplatelet therapy for minimum of a year.  He will be started on high potency statin therapy, low-dose beta blocker therapy, with ultimate ACE inhibition.  Metformin will be held for 48 hours postcontrast.     03/10/2018 Nuclear Study Highlights     The left ventricular ejection fraction is mildly decreased (45-54%). Nuclear stress EF: 45%. There was no ST segment deviation noted during stress. Findings consistent with ischemia. This is a high risk study.   TID 1.3 Normalization artifact likely but appears to be both inferior and anterior ischemia Diffuse hypokinesis EF 45%      March 21, 2018 cardiac catheterization conclusion     Previously placed Prox LAD to Mid LAD stent (unknown type) is widely patent. The left ventricular systolic function is normal. LV end diastolic pressure is normal. The left ventricular ejection fraction is 50-55% by visual estimate. There is no mitral valve regurgitation.   Preserved global LV contractility with a small region of very mild residual mid anterolateral hypocontractility.  Ejection fraction 50-55%.   No significant coronary obstructive disease with evidence  for widely patent mid LAD stent after the takeoff of a first diagonal vessel, normal left circumflex and normal dominant RCA.   RECOMMENDATION: Medical therapy.  Continue DAPT in this patient status post anterior ST segment MI August 2018.  Optimize blood pressure.  Continue high potency statin therapy.      NUCLEAR STUDY 10/19/2019 The left ventricular ejection fraction is normal (55-65%). Nuclear stress EF: 59%. There was no ST segment deviation noted during stress. Defect 1: There is a medium defect of mild severity present in the basal inferior and mid inferior location. Findings consistent with ischemia. This is a low risk study.   Abnormal, low risk stress nuclear study with mild inferior ischemia.  Gated ejection fraction 59% with normal wall motion.   ASSESSMENT:     1. CAD in native artery   2. Complete heart block ([place])   3. Pacemaker   4. Essential hypertension   5. Hyperlipidemia LDL goal <70   6. OSA (obstructive sleep apnea)   7. RBBB   8. Type 2 diabetes mellitus with complication, without long-term current use of insulin [facility])     PLAN:  Patrick [name] is a 67 year old gentleman who has a history of hypertension, hyperlipidemia, and diabetes mellitus. On 08/04/2017 he presented with an acute anterior wall ST segment elevation myocardial infarctions and was found to have total occlusion of his LAD after the first diagonal vessel.  He was found to have a long occluded segment which was successfully treated with DES stenting with a 3.038 mm Resolute DES stent.  A nuclear perfusion study in March 2019 suggested the possibility of ischemia involving both the anterior and inferior wall.  EF was 45% and there was diffuse hypokinesis.  With his blood pressure elevation, and nuclear study suggestive of ischemia, I added amlodipine 5 mg to his regimen and further titrated metoprolol to 37.5 mg twice a day.  He underwent definitive repeat cardiac catheterization on 03/21/2018 which revealed a widely patent mid LAD stent and a normal left circumflex and dominant RCA.  He subsequently experienced recurrent chest pain symptomatology with plus minus atypical features.  A subsequent nuclear perfusion study was low risk and raised concern for possible mild inferior ischemia.  I suspect this most likely is diaphragmatic attenuation.  At his last catheterization his RCA and circumflex vessels were entirely normal.  He had normal perfusion in the LAD territory and at last cath his LAD stent was widely patent.  Remotely, he had developed complete heart block with marked bradycardia requiring initial temporary pacemaker but heart rhythm stabilized following discontinuance of beta-blocker therapy in April 2022.  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 Crissie Sickles.  He feels improved since his pacemaker insertion.  He continues to experience some decreased energy.  He also developed a kidney stone and was scheduled to undergo removal but fortunately he self passed the stone on September 14, 2022.  He has now resumed his Plavix.  [place].  Presently, he denies any recurrent chest pain symptomatology.  His blood pressure today is stable and on repeat by me was 128/78 on amlodipine 5 mg, isosorbide 60 mg, in addition to ranolazine 1000 mg twice a day.  He continues to be on DAPT with aspirin/Plavix.  He is diabetic on metformin and is  participating in the sole study with semaglutide.  He is on rosuvastatin 20 mg at bedtime for hyperlipidemia.  Lipid studies recently done by Patrick [name] in [facility] in November 2023 showed total cholesterol 91 LDL 30 and triglycerides 125.  He was recently diagnosed with obstructive sleep apnea and has initiated CPAP therapy which is followed by Patrick [name].  He also has issues with peripheral neuropathy.  He sees Dr. April Manson.  He continues to have remote pacemaker checks followed by Dr. Lovena Le.  I will see him in 6 months for follow-up evaluation or sooner as needed.   tedication Adjustments/Labs and Tests Ordered: Current medicines are reviewed at length with the patient today.  Concerns regarding medicines are outlined above.  Medication changes, Labs and Tests ordered today are listed in the Patient Instructions below. Patient Instructions  Medication Instructions:  NO CHANGES  *If you need a refill on your cardiac medications before your next appointment, please call your pharmacy*    Follow-Up: At [facility], you and your health needs are our priority.  As part of our continuing mission to  provide you with exceptional heart care, we have created designated Provider Care Teams.  These Care Teams include your primary Cardiologist (physician) and Advanced Practice Providers (APPs -  Physician Assistants and Nurse Practitioners) who all work together to provide you with the care you need, when you need it.  We recommend signing up for the patient portal called "MyChart".  Sign up information is provided on this After Visit Summary.  MyChart is used to connect with patients for Virtual Visits (Telemedicine).  Patients are able to view lab/test results, encounter notes, upcoming appointments, etc.  Non-urgent messages can be sent to your provider as well.   To learn more about what you can do with MyChart, go to [email or web address].    Your next appointment:    6-7 months with Dr. Claiborne Billings    Signed, Shelva Majestic, MD  02/08/2023 9:42 AM    [place] [address], [place], [place], [place]  [address] Phone: [phone]

## 2023-02-08 ENCOUNTER — Encounter: Payer: Self-pay | Admitting: Cardiovascular Disease

## 2023-02-17 ENCOUNTER — Ambulatory Visit (INDEPENDENT_AMBULATORY_CARE_PROVIDER_SITE_OTHER): Payer: Medicare Other

## 2023-02-17 DIAGNOSIS — I442 Atrioventricular block, complete: Secondary | ICD-10-CM

## 2023-02-18 ENCOUNTER — Encounter: Payer: Self-pay | Admitting: Cardiovascular Disease

## 2023-02-18 LAB — CUP PACEART REMOTE DEVICE CHECK
Date Time Interrogation Session: 20240229074653
Implantable Lead Connection Status: 753985
Implantable Lead Connection Status: 753985
Implantable Lead Implant Date: 20230528
Implantable Lead Implant Date: 20230528
Implantable Lead Location: 753859
Implantable Lead Location: 753860
Implantable Lead Model: 377169
Implantable Lead Model: 377171
Implantable Lead Serial Number: 8000368134
Implantable Lead Serial Number: 8000749513
Implantable Pulse Generator Implant Date: 20230528
Pulse Gen Model: 407145
Pulse Gen Serial Number: 70380012

## 2023-02-20 ENCOUNTER — Other Ambulatory Visit: Payer: Self-pay | Admitting: Cardiovascular Disease

## 2023-02-25 NOTE — Telephone Encounter (Signed)
Patient seen in office 02/13 with Dr.Kelly.  Thank you!

## 2023-02-26 ENCOUNTER — Other Ambulatory Visit (HOSPITAL_COMMUNITY): Payer: Self-pay

## 2023-02-26 ENCOUNTER — Telehealth: Payer: Self-pay

## 2023-02-26 NOTE — Telephone Encounter (Signed)
Per test claim no PA required.

## 2023-03-22 NOTE — Progress Notes (Signed)
Remote pacemaker transmission.   

## 2023-05-19 ENCOUNTER — Ambulatory Visit (INDEPENDENT_AMBULATORY_CARE_PROVIDER_SITE_OTHER): Payer: Medicare Other

## 2023-05-19 DIAGNOSIS — I442 Atrioventricular block, complete: Secondary | ICD-10-CM | POA: Diagnosis not present

## 2023-05-20 LAB — CUP PACEART REMOTE DEVICE CHECK
Battery Voltage: 90
Date Time Interrogation Session: 20240529103904
Implantable Lead Connection Status: 753985
Implantable Lead Connection Status: 753985
Implantable Lead Implant Date: 20230528
Implantable Lead Implant Date: 20230528
Implantable Lead Location: 753859
Implantable Lead Location: 753860
Implantable Lead Model: 377169
Implantable Lead Model: 377171
Implantable Lead Serial Number: 8000368134
Implantable Lead Serial Number: 8000749513
Implantable Pulse Generator Implant Date: 20230528
Pulse Gen Model: 407145
Pulse Gen Serial Number: 70380012

## 2023-05-25 ENCOUNTER — Other Ambulatory Visit: Payer: Self-pay | Admitting: Cardiology

## 2023-06-10 NOTE — Progress Notes (Signed)
Remote pacemaker transmission.   

## 2023-08-02 ENCOUNTER — Encounter: Payer: Self-pay | Admitting: Neurology

## 2023-08-02 ENCOUNTER — Ambulatory Visit: Payer: Medicare Other | Admitting: Neurology

## 2023-08-02 VITALS — BP 128/72 | Ht 71.0 in | Wt 199.0 lb

## 2023-08-02 DIAGNOSIS — G629 Polyneuropathy, unspecified: Secondary | ICD-10-CM

## 2023-08-02 NOTE — Progress Notes (Signed)
[facility]  PATIENT: Patrick [name] DOB: December 21, 1956  REFERRING CLINICIAN: Lauro Regulus, MD HISTORY FROM: Patient  REASON FOR VISIT: Recurrent syncope/near syncope follow up   HISTORICAL  CHIEF COMPLAINT:  Chief Complaint  Patient presents with   Follow-up    Rm 13, alone, patients reports less brain fog, having peripheral neuropathy and wants to discuss     INTERVAL HISTORY 08/02/2023:  Patient presents today for follow-up, last visit was a year ago.  Since then, he has been doing well, reports decrease in the brain fog.  He reported 4 weeks ago he was diagnosed with COVID but was able to go back to work within the week. Now his main complaint is pain in the bilateral toes and tingling at bottom of feet that he feels are related to peripheral neuropathy.  This pain started about a year ago and described as aching burning type pain.  During this time, he was also diagnosed with both osteoarthritis and rheumatoid arthritis.  He is being treated for that.  Patient has a history of diabetes for more than 10 years.  Currently he is taking supplements including alpha lipoic acid, L-Carnitine and some plant-based collagen booster on top of his prescribed medication.  He is also doing acupuncture.   INTERVAL HISTORY 07/30/2022:  Patient presents today for follow up. Since last visit, he reports doing well in terms of brain fog and dizziness but in early May he had another episode of dizziness and his watch showing a heart rate of 30. 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 Since the placement of the pacemaker, he reports worsening brain fog, difficulty concentrating and word finding diffuclty. 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    INTERVAL HISTORY 01/14/2022:  Patient presented today for follow-up, since last visit in October he has been taking meclizine.   Initially he reported the meclizine helped but later was not helpful, he increase it to 25 mg up to 3 times daily but no relief.  He described the dizziness as difficulty with focusing, difficulty with concentration, sick state that it feels like there is cotton instead of brain matter and there is impairment of cognitive ability, likely brain fog.  Actually he denies any trouble with balance he he is not unsteady.  Denies any falls.  Stated last year in March she was diagnosed with COVID, mild infection.  Denies any recent TBI. He has not followed up with ENT.   HISTORY OF PRESENT ILLNESS:  This is a 67 year old gentleman with past medical history of hypertension, hyperlipidemia, CKD, diabetes, previous heart attack who is presenting with complaint of recurrent syncope and dizziness.  Patient said the first episode started in April 2022 when he has a episode of dizziness, feeling faint and fuzzy feeling in his head.  He went to the ED was observed and was told it was related to dehydration and discharged home.  Patient had another event on June 17 where he fell and hit his head.  On July 25 he had another event, fell hit his head, was taken to the ED via ambulance and in the ED was noted to have a heart rate of 30 bpm.  He was given the diagnosis of heart block and had a temporary pacemaker.  He was admitted to the cardiology service and his metoprolol was discontinued and patient have a heart monitor placed.  After  discontinuation of the metoprolol his heart rate is normalized.  He was told that the bradycardia was secondary to his metoprolol.  Since being discharged from the hospital he has additional episodes of dizziness but no fainting. He describes the dizziness as lightheaded and fuzzy feeling in his head.   He did follow-up with cardiology again and was told that his recurrent dizziness did not sound cardiac in nature given normal vitals and his EKG strip on his watch was normal.  He was recommended to  follow-up with neurology and ENT.   He does report hearing loss and tinnitus but said this is chronic.  OTHER MEDICAL CONDITIONS: HTN, Heart attack, DM, HLD   REVIEW OF SYSTEMS: Full 14 system review of systems performed and negative with exception of: as noted   ALLERGIES: No Known Allergies  HOME MEDICATIONS: Outpatient Medications Prior to Visit  Medication Sig Dispense Refill   Acetylcarnitine HCl (ACETYL L-CARNITINE PO) Take 1,500 mg by mouth daily.     ALPHA LIPOIC ACID PO Take 600 mg by mouth daily.     amLODipine (NORVASC) 5 MG tablet Take 5 mg by mouth daily.     aspirin (ASPIRIN LOW DOSE) 81 MG EC tablet Take 1 tablet (81 mg total) by mouth daily. 90 tablet 0   clopidogrel (PLAVIX) 75 MG tablet TAKE 1 TABLET(75 MG) BY MOUTH DAILY 90 tablet 3   Coenzyme Q10 200 MG capsule Take 200 mg by mouth daily.     Cyanocobalamin (VITAMIN B-12 PO) Take by mouth.     folic acid (FOLVITE) 1 MG tablet Take 1 mg by mouth daily.     Glucosamine-Chondroit-Vit C-Mn (GLUCOSAMINE 1500 COMPLEX PO) Take 1,500 mg by mouth daily.     hydroxychloroquine (PLAQUENIL) 200 MG tablet Take 1 tablet by mouth 2 (two) times daily.     isosorbide mononitrate (IMDUR) 60 MG 24 hr tablet TAKE 1 AND 1/2 TABLETS(90 MG) BY MOUTH DAILY (Patient taking differently: 60 mg daily.) 135 tablet 3   Magnesium 250 MG TABS Take 250 mg by mouth daily.     metFORMIN (GLUCOPHAGE-XR) 500 MG 24 hr tablet Take 1,000 mg by mouth 2 (two) times daily with a meal.     methotrexate (RHEUMATREX) 2.5 MG tablet Take 2.5 mg by mouth as directed. Take 1 tablet for 5 days out of 7, weekly     Multiple Vitamins-Minerals (ABC PLUS SENIOR PO) Take 1 tablet by mouth daily.     nitroGLYCERIN (NITROSTAT) 0.4 MG SL tablet Place 1 tablet (0.4 mg total) under the tongue every 5 (five) minutes x 3 doses as needed for chest pain. 25 tablet 0   Nutritional Supplements (SILICA PO) Take 15 mLs by mouth daily. Plant based collagen booster     nystatin cream  (MYCOSTATIN) Apply 1 application. topically daily as needed for dry skin.     Omega-3 Fatty Acids (FISH OIL OMEGA-3 PO) Take 2 capsules by mouth 2 (two) times daily.      Potassium 99 MG TABS Take 99 mg by mouth daily.     ranolazine (RANEXA) 1000 MG SR tablet TAKE 1 TABLET(1000 MG) BY MOUTH TWICE DAILY (Patient taking differently: 1,000 mg daily.) 180 tablet 3   tamsulosin (FLOMAX) 0.4 MG CAPS capsule Take 2 capsules (0.8 mg total) by mouth daily. 180 capsule 3   triamcinolone cream (KENALOG) 0.1 % Apply 1 application. topically daily as needed (rash).     TRULICITY 0.75 MG/0.5ML SOPN Inject 0.75 mg into the skin once a week.  vitamin C (ASCORBIC ACID) 500 MG tablet Take 500 mg by mouth daily.     mirabegron ER (MYRBETRIQ) 25 MG TB24 tablet Take 1 tablet (25 mg total) by mouth daily. (Patient not taking: Reported on 08/02/2023) 90 tablet 3   pantoprazole (PROTONIX) 40 MG tablet Take 40 mg by mouth daily. (Patient not taking: Reported on 08/02/2023)     pyridOXINE (VITAMIN B-6) 50 MG tablet Take 50 mg by mouth daily. (Patient not taking: Reported on 08/02/2023)     rosuvastatin (CRESTOR) 20 MG tablet Take 20 mg by mouth at bedtime. (Patient not taking: Reported on 08/02/2023)     SEMAGLUTIDE PO Take 1 tablet by mouth daily. (Patient not taking: Reported on 08/02/2023)     No facility-administered medications prior to visit.    PAST MEDICAL HISTORY: Past Medical History:  Diagnosis Date   CAD in native artery    a. anterolateral STEMI 07/2017 s/p DES to prox-mid LAD, EF 55%.   GERD (gastroesophageal reflux disease)    History of kidney stones    Hyperlipidemia    Hypertension    Myocardial infarction [facility])    Overactive bladder    Presence of permanent cardiac pacemaker    Renal calculi    Rheumatoid arthritis (HCC)    Syncope    Uncontrolled diabetes mellitus     PAST SURGICAL HISTORY: Past Surgical History:  Procedure Laterality Date   CORONARY/GRAFT ACUTE MI REVASCULARIZATION N/A  08/04/2017   Procedure: Coronary/Graft Acute MI Revascularization;  Surgeon: Lennette Bihari, MD;  Location: [facility] INVASIVE CV LAB;  Service: Cardiovascular;  Laterality: N/A;   LEFT HEART CATH AND CORONARY ANGIOGRAPHY N/A 08/04/2017   Procedure: LEFT HEART CATH AND CORONARY ANGIOGRAPHY;  Surgeon: Lennette Bihari, MD;  Location: [facility] INVASIVE CV LAB;  Service: Cardiovascular;  Laterality: N/A;   LEFT HEART CATH AND CORONARY ANGIOGRAPHY N/A 03/21/2018   Procedure: LEFT HEART CATH AND CORONARY ANGIOGRAPHY;  Surgeon: Lennette Bihari, MD;  Location: [facility] INVASIVE CV LAB;  Service: Cardiovascular;  Laterality: N/A;   PACEMAKER IMPLANT N/A 05/17/2022   Procedure: PACEMAKER IMPLANT;  Surgeon: Marinus Maw, MD;  Location: [facility] INVASIVE CV LAB;  Service: Cardiovascular;  Laterality: N/A;   PROSTATE SURGERY     in office Dr Evelene Croon   TEMPORARY PACEMAKER N/A 07/14/2021   Procedure: TEMPORARY PACEMAKER;  Surgeon: Marykay Lex, MD;  Location[facility] INVASIVE CV LAB;  Service: Cardiovascular;  Laterality: N/A;   TONSILLECTOMY      FAMILY HISTORY: Family History  Problem Relation Age of Onset   Dementia Mother    Heart disease Father    Heart attack Father     SOCIAL HISTORY: Social History   Socioeconomic History   Marital status: Married    Spouse name: Not on file   Number of children: 3   Years of education: GED   Highest education level: Not on file  Occupational History   Occupation: Editor, commissioning  Tobacco Use   Smoking status: Former    Types: Cigarettes   Smokeless tobacco: Never   Tobacco comments:    Quit smoking at age 33.  Vaping Use   Vaping status: Never Used  Substance and Sexual Activity   Alcohol use: Yes    Alcohol/week: 2.0 standard drinks of alcohol    Types: 2 Shots of liquor per week   Drug use: No   Sexual activity: Not on file  Other Topics Concern   Not on file  Social History Narrative  Lives at home with his wife.   Right-handed.   Caffeine use: 3.5 cups per  day.   Social Determinants of Health   Financial Resource Strain: Not on file  Food Insecurity: Not on file  Transportation Needs: Not on file  Physical Activity: Not on file  Stress: Not on file  Social Connections: Not on file  Intimate Partner Violence: Not on file    PHYSICAL EXAM  GENERAL EXAM/CONSTITUTIONAL: Vitals:  Vitals:   08/02/23 1113  BP: 128/72  Weight: 199 lb (90.3 kg)  Height: 5\' 11"  (1.803 m)    Body mass index is 27.75 kg/m. Wt Readings from Last 3 Encounters:  08/02/23 199 lb (90.3 kg)  02/02/23 222 lb 6.4 oz (100.9 kg)  10/29/22 215 lb (97.5 kg)   Patient is in no distress; well developed, nourished and groomed; neck is supple  MUSCULOSKELETAL: Gait, strength, tone, movements noted in Neurologic exam below  NEUROLOGIC: MENTAL STATUS:      No data to display            08/02/2023   11:15 AM 07/30/2022   10:37 AM  Montreal Cognitive Assessment   Visuospatial/ Executive (0/5) 5 5  Naming (0/3) 3 3  Attention: Read list of digits (0/2) 2 2  Attention: Read list of letters (0/1) 1 1  Attention: Serial 7 subtraction starting at 100 (0/3) 3 3  Language: Repeat phrase (0/2) 2 2  Language : Fluency (0/1) 0 0  Abstraction (0/2) 2 2  Delayed Recall (0/5) 3 4  Orientation (0/6) 6 5  Total 27 27  Adjusted Score (based on education)  27    CRANIAL NERVE: 2nd, 3rd, 4th, 6th -visual fields full to confrontation, extraocular muscles intact, no nystagmus 5th - facial sensation symmetric 7th - facial strength symmetric 8th - hearing intact 9th - palate elevates symmetrically, uvula midline 11th - shoulder shrug symmetric 12th - tongue protrusion midline  MOTOR:  normal bulk and tone, full strength in the BUE, BLE  SENSORY:  normal and symmetric to light touch  COORDINATION:  finger-nose-finger, fine finger movements normal  REFLEXES:  deep tendon reflexes present and symmetric  GAIT/STATION:  normal   DIAGNOSTIC DATA (LABS,  IMAGING, TESTING) - I reviewed patient records, labs, notes, testing and imaging myself where available.  Lab Results  Component Value Date   WBC 6.3 05/16/2022   HGB 12.9 (L) 05/16/2022   HCT 35.5 (L) 05/16/2022   MCV 88.5 05/16/2022   PLT 169 05/16/2022      Component Value Date/Time   NA 137 05/16/2022 0324   NA 137 09/01/2021 1628   NA 140 05/18/2013 0959   K 3.6 05/16/2022 0324   K 4.0 05/18/2013 0959   CL 105 05/16/2022 0324   CL 104 05/18/2013 0959   CO2 26 05/16/2022 0324   CO2 29 05/18/2013 0959   GLUCOSE 104 (H) 05/16/2022 0324   GLUCOSE 142 (H) 05/18/2013 0959   BUN 20 05/16/2022 0324   BUN 23 09/01/2021 1628   BUN 27 (H) 05/18/2013 0959   CREATININE 1.30 (H) 05/16/2022 0324   CREATININE 1.03 05/18/2013 0959   CALCIUM 8.8 (L) 05/16/2022 0324   CALCIUM 9.3 05/18/2013 0959   PROT 6.3 (L) 05/15/2022 1601   ALBUMIN 3.9 05/15/2022 1601   AST 40 05/15/2022 1601   ALT 40 05/15/2022 1601   ALKPHOS 35 (L) 05/15/2022 1601   BILITOT 1.0 05/15/2022 1601   GFRNONAA 60 (L) 05/16/2022 0324   GFRNONAA >60 05/18/2013 1914  GFRAA 82 03/16/2018 1155   GFRAA >60 05/18/2013 0959   Lab Results  Component Value Date   CHOL 79 07/14/2021   HDL 27 (L) 07/14/2021   LDLCALC 34 07/14/2021   TRIG 89 07/14/2021   CHOLHDL 2.9 07/14/2021   Lab Results  Component Value Date   HGBA1C 5.9 (H) 07/14/2021   No results found for: "VITAMINB12" Lab Results  Component Value Date   TSH 0.732 05/15/2022   Last HA1C 6.1 04/26/2023  Head CT 06/06/21 1. No acute intracranial abnormality. 2. Stable age related atrophy and minor chronic small vessel ischemia.   Cervical spine CT 06/06/2021 1. No acute fracture or subluxation of the cervical spine. 2. 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   EEG 08/06/2022 Normal    ASSESSMENT AND PLAN  67 y.o. year old male with past medical history of previous heart attack in 2018, hypertension, hyperlipidemia, CKD who is presenting for  follow up.  Overall he is doing very well, denies any brain fog, his main complaint now is his peripheral neuropathy.  At this time he is only complaining of mild pain on top of the toes and tingling at the bottom of feet.  He reports sensation to touch is normal.  His peripheral neuropathy is likely related to his diabetes.  He reports the pain is tolerable, we will hold medication for now.  Continue with your currents medications and supplements, continue to follow with your PCP, continue with exercise, good sleep, and good diet.  I will see him in 1 year for follow-up.  I did advise him if the pain get worse during this time to contact us for another evaluation.  He voiced complete understanding.    1. Peripheral polyneuropathy     Patient Instructions  Continue current medications  Follow up in a year or sooner if worse    No orders of the defined types were placed in this encounter.    No orders of the defined types were placed in this encounter.   Return in about 1 year (around 08/01/2024).  I have spent a total of 30 minutes dedicated to this patient today, preparing to see patient, performing a medically appropriate examination and evaluation, ordering tests and/or medications and procedures, and counseling and educating the patient/family/caregiver; independently interpreting result and communicating results to the family/patient/caregiver; and documenting clinical information in the electronic medical record.   Windell Norfolk, MD 08/02/2023, 2:09 PM  [facility] [address], [address] [place], [place] [address] [phone]

## 2023-08-02 NOTE — Patient Instructions (Signed)
Continue current medications  Follow up in a year or sooner if worse

## 2023-08-18 ENCOUNTER — Ambulatory Visit (INDEPENDENT_AMBULATORY_CARE_PROVIDER_SITE_OTHER): Payer: Medicare Other

## 2023-08-18 DIAGNOSIS — I442 Atrioventricular block, complete: Secondary | ICD-10-CM

## 2023-08-18 LAB — CUP PACEART REMOTE DEVICE CHECK
Date Time Interrogation Session: 20240828092703
Implantable Lead Connection Status: 753985
Implantable Lead Connection Status: 753985
Implantable Lead Implant Date: 20230528
Implantable Lead Implant Date: 20230528
Implantable Lead Location: 753859
Implantable Lead Location: 753860
Implantable Lead Model: 377169
Implantable Lead Model: 377171
Implantable Lead Serial Number: 8000368134
Implantable Lead Serial Number: 8000749513
Implantable Pulse Generator Implant Date: 20230528
Pulse Gen Model: 407145
Pulse Gen Serial Number: 70380012

## 2023-08-27 NOTE — Progress Notes (Signed)
Remote pacemaker transmission.   

## 2023-09-09 ENCOUNTER — Ambulatory Visit: Payer: Medicare Other | Attending: Cardiovascular Disease | Admitting: Cardiovascular Disease

## 2023-09-09 ENCOUNTER — Encounter: Payer: Self-pay | Admitting: Cardiovascular Disease

## 2023-09-09 VITALS — BP 115/60 | HR 69 | Wt 195.0 lb

## 2023-09-09 DIAGNOSIS — E118 Type 2 diabetes mellitus with unspecified complications: Secondary | ICD-10-CM | POA: Diagnosis present

## 2023-09-09 DIAGNOSIS — M069 Rheumatoid arthritis, unspecified: Secondary | ICD-10-CM | POA: Insufficient documentation

## 2023-09-09 DIAGNOSIS — I10 Essential (primary) hypertension: Secondary | ICD-10-CM | POA: Insufficient documentation

## 2023-09-09 DIAGNOSIS — I2102 ST elevation (STEMI) myocardial infarction involving left anterior descending coronary artery: Secondary | ICD-10-CM | POA: Insufficient documentation

## 2023-09-09 DIAGNOSIS — I442 Atrioventricular block, complete: Secondary | ICD-10-CM | POA: Insufficient documentation

## 2023-09-09 DIAGNOSIS — G4733 Obstructive sleep apnea (adult) (pediatric): Secondary | ICD-10-CM | POA: Diagnosis present

## 2023-09-09 DIAGNOSIS — Z950 Presence of cardiac pacemaker: Secondary | ICD-10-CM | POA: Diagnosis not present

## 2023-09-09 MED ORDER — RANOLAZINE ER 1000 MG PO TB12: 1000.0000 mg | ORAL_TABLET | Freq: Two times a day (BID) | ORAL | 3 refills | Status: DC

## 2023-09-09 NOTE — Progress Notes (Signed)
Cardiology Office Note    Date:  09/11/2023   ID:  Patrick [name], DOB 1956/02/16, MRN [id number]  PCP:  Patrick Regulus, MD  Cardiologist:  Nicki Guadalajara, MD    History of Present Illness:  Patrick [name] is a 67 y.o. male who suffered an anterior ST segment elevation myocardial infarction on 08/04/2017. I last saw him in February 2024.  He presents for 49-month follow-up cardiology evaluation.  Patrick [name] has a history of hypertension, hyperlipidemia, and diabetes mellitus.  He was admitted on 08/04/2017 with ST segment elevation anterolaterally and a code STEMI was activated.  I performed emergent cardiac catheterization which revealed total occlusion of his LAD after the first diagonal vessel.  There was a long segment of occlusion and ultimately a Resolute on a 3.038 mm stent was inserted, postdilated 3.25 mm with 100% occlusion being reduced to 0% and TIMI 0 flow been improved to TIMI 3 flow.  He developed transient heart block during the procedure which required IV atropine.  Of note, his lipid panel during that evaluation was consistent with an atherogenic dyslipidemic pattern with triglycerides of 370, VLDL 74, low HDL at 29, and his total cholesterol was 204 with LDL 101.  He was started on atorvastatin and also was on fenofibrate.  Subsequent, please fenofibrate was discontinued by his primary physician and he has been taking over-the-counter fish oil.  Repeat blood work 1 month later by his primary physician showed a total cholesterol 124, LDL 47, triglycerides were 251, HDL was 26.5.  Over the past several months, he has done well.  He denies any definitive recurrent anginal symptomatology.  He had experienced 1 vague episode of  similar sensation after working 16 hours a day for 3 days and having to go to another business trip in [place].    When I saw him he had noticed some occasional episodes of heartburn sensation, but also has noted this with mild exertion.  He was seen by  Patrick Reining, Patrick [name] on 02/28/2018.  Because of worsening heartburn symptoms and fatigue, which seemed nitrate responsive she recommended a follow-up nuclear perfusion study for reassessment.  His nuclear study  on 03/10/2018 was interpreted as a high risk study suggested ischemia involving both the inferior as well as anterior wall.  There was diffuse hypokinesis.  EF was 45%.  When I saw him in follow-up of the nuclear study I recommended definitive cardiac catheterization.  Cardiac catheterization was done on March 21, 2018. This revealed preserved global LV contractility with a small region of very mild residual mid anterolateral hypocontractility.  Ejection fraction was 50-55%.  The stent in the mid LAD was widely patent and his circumflex and dominant RCA were normal.  Medical therapy was recommended and optimization of blood pressure.  Due to lipid studies that were significantly elevated in August 2018 I recommended the addition of his Vascepa to his atorvastatin.   I saw him in May 2019 and last saw him in October 2019.  At that time he continued to do well.  At the time, he was on amlodipine 10 mg and metoprolol 25 mg twice a day.  His resting pulse was 96 and I recommended titration of metoprolol tartrate to 37.5 mg twice a day.  Laboratory on September 19, 2018 at the [facility]: Total cholesterol was 101, HDL 33, LDL 50, triglycerides had improved to 88.  VLDL was 18.  He is enrolled in a study with Patrick [name] which  is an oral Victoza therapy.  Hemoglobin A1c before the study was 8.3 and 1 week ago repeat hemoglobin A1c was 6.8.     I evaluated him in a telemedicine visit on October 11, 2019.  At that time he had told me that 3 weeks previously he went to the beach and while walking on the beach for 10 to 15 minutes began to notice a substernal chest discomfort.  He was able to walk through it but at a slower pace.  Since that time, he has experienced some recurrent exertional chest tightness  particularly if walking fast up hills.  He has not tried nitroglycerin.  He denies any rest discomfort.  He denies any palpitations.  He denies PND orthopnea.  He had recently seen Patrick [name].  He tells me during his recent evaluation his blood pressure was 131/71 and his pulse was 68.  Laboratory revealed a total cholesterol 109, triglycerides 218, VLDL 44, HDL 29.9, and LDL 36.    During theevaluation, I recommended slight titration of meds Toprol 50 mg twice a day, continue amlodipine 10 mg daily and initiated isosorbide 30 mg.  I scheduled him to undergo a nuclear perfusion study for reevaluation and further assessment of his chest pain and potential for ischemia.  When I saw him in November 2020 he admitted to some mild recurrent chest pain episodes.  He had an episode when he was lifting a refrigerator outside and noticed discomfort.  He underwent a nuclear perfusion study on October 19, 2019.  There was a suggestion of a medium defect of mild severity in the basal inferior to mid inferior wall suggestive of possible ischemia.  He did not have any ECG changes during stress and he had normal wall motion and EF at 59% on quantitative analysis.  His chest pain has some atypical features.  He wasa been taking pantoprazole for GERD.  During that evaluation, I recommended increasing isosorbide to 60 mg daily and also suggested a trial of increasing Protonix to twice a day for the next several weeks to see if there was any improvement.  I saw him in April 2021 and over the prior 2 months he denied any major change in symptomatology.  Oftentimes he may noticed the development of some mild chest discomfort approximately 15 minutes into a walk.  He typically does not walk much longer than that so was uncertain if he was able to walk through the pain.  He denied any severe chest pain or significant dyspnea.  There was one occurrence where he had noticed a vague chest sensation at night.  He has not been successful  with weight loss.    Since his prior evaluation, he presented to [facility] ER on March 29, 2021 with dizziness.  His vital signs were normal and he had extensive imaging and was sent home.  Apparently he had several other occurrences of presyncope and had a syncopal spell on July 14, 2021 and was found to be bradycardic and hypotensive.  He presented to [facility] ER where ECG revealed complete heart block.  He underwent insertion of a temporary pacemaker.  His beta-blocker was held and conduction recovered.  He was seen by Patrick [name] who opted to defer permanent pacemaker implant.  When I saw him in follow-up on July 25, 2021 he felt well and was no longer on rate control medication.  He denied any anginal symptomatology or shortness of breath.  He is still working in [employer].  His blood pressure at  home has been elevated, and he was started on losartan HCT 100/12.5 mg in place of losartan 100 mg.  He continued to be on amlodipine 10 mg, isosorbide 90 mg, continues to be on aspirin 81 mg and reduced dose of Brilinta 60 mg twice a day.  He continues to be on Ranexa 1000 mg twice a day.  He was seen by his primary physician Patrick [name] today prior to my evaluation.  During that evaluation he remained stable and had not had any episodes of chest discomfort or dizziness.  He was experiencing easy bruisability on low-dose aspirin and Brilinta 60 mg twice a day and I suggested switching to generic clopidogrel 75 mg daily once he finishes current Brilinta pills.  He was evaluated by Patrick [name] in September 2022 after he had experienced another episode of dizziness on August 19, 2021.  EKG showed sinus rhythm with rates in the 80s with chronic right bundle branch block but no high-grade AV block.  At the time his blood pressure was well controlled.  I last saw him on November 18, 2021 at which time he continued to feel well and denied any dizziness over the past month.. He had been evaluated by  neurologist and was given a prescription for meclizine to take as needed.  He denied any recurrent chest pain and is able to walk without discomfort.  He has follow-up scheduled with neurology.  He also had worn a 3-day monitor by Patrick [name] which apparently did not reveal any major findings.    Since I last saw him, he developed an episode of heart block and received a notice on his phone on May 15, 2022 with heart rates in the 30s.  He was symptomatic with dizziness.  He presented to the emergency room and was given atropine.  ECG showed 2-1 AV block with heart rate of 51 chronic right bundle branch block.  Creatinine was 1.73.  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 Sharrell [name] on May 17, 2022.  Patrick [name] was subsequently evaluated on 02/26/2022 by Patrick Fabian, Patrick [name].  At that time he felt improved he had experienced a short episode of SVT today after his pacemaker insertion.  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  I saw him on September 17, 2022.  He felt well  but admitted to some decreased energy.  He has had issues with a kidney stone.  He was to undergo surgery and had begun to hold Plavix but he passed the "kidney stone on September 25 spontaneously and surgery was aborted.  He is now back on Plavix.  He feels improved since insertion of his pacemaker.  He denies any anginal symptoms.  He was evaluated by Patrick [name] on September 28, 2022.  He was in the mountains the weekend prior to that evaluation and noticed some mild chest discomfort which dissipated quickly within 30 seconds with rest.  He was given a prescription for sublingual nitroglycerin..  I last saw him on February 02, 2023. Mr. Pardee has felt well.  He tells me he was recently started on CPAP therapy  followed by Patrick [name].  He has noticed improvement in his sleep pattern.  He had undergone laboratory in November 2023 and total cholesterol was 91 LDL 30 triglycerides 125 with creatinine  1.1 and glucose 143.  He denied any recent chest pain.  He is followed by Patrick [name] in [place].    Since I last saw him, he has undergone remote pacemaker checks by Dr. Sharrell [name].  He also has had significant weight loss with weight from 222 pounds down to approximately 195 pounds.  He initially had lost 15 pounds by June and then more recently has lost additional weight.  He is now on a "anti-inflammatory diet "since June.  He tells me he was told of having both rheumatoid as well as osteomyelitis.  At times he notes vague chest pain.  He had reduced his ranolazine dose down from 100 mg 12 twice a day to just daily.  He is now on Plaquenil and methotrexate for his rheumatoid arthritis.  He continues to be on DAPT with aspirin/Plavix.  He is on amlodipine 5 mg in addition to isosorbide mononitrate 60 mg in the morning and 30 mg at night.  He stopped taking rosuvastatin 20 mg since August.  He is diabetic on Trulicity.  He presents for evaluation.     Past Medical History:  Diagnosis Date   CAD in native artery    a. anterolateral STEMI 07/2017 s/p DES to prox-mid LAD, EF 55%.   GERD (gastroesophageal reflux disease)    History of kidney stones    Hyperlipidemia    Hypertension    Myocardial infarction [facility])    Overactive bladder    Presence of permanent cardiac pacemaker    Renal calculi    Rheumatoid arthritis (HCC)    Syncope    Uncontrolled diabetes mellitus     Past Surgical History:  Procedure Laterality Date   CORONARY/GRAFT ACUTE MI REVASCULARIZATION N/A 08/04/2017   Procedure: Coronary/Graft Acute MI Revascularization;  Surgeon: Lennette Bihari, MD;  Location: [facility] INVASIVE CV LAB;  Service: Cardiovascular;  Laterality: N/A;   LEFT HEART CATH AND CORONARY ANGIOGRAPHY N/A  08/04/2017   Procedure: LEFT HEART CATH AND CORONARY ANGIOGRAPHY;  Surgeon: Lennette Bihari, MD;  Location: [facility] INVASIVE CV LAB;  Service: Cardiovascular;  Laterality: N/A;   LEFT HEART CATH AND CORONARY ANGIOGRAPHY N/A 03/21/2018   Procedure: LEFT HEART CATH AND CORONARY ANGIOGRAPHY;  Surgeon: Lennette Bihari, MD;  Location: [facility] INVASIVE CV LAB;  Service: Cardiovascular;  Laterality: N/A;   PACEMAKER IMPLANT N/A 05/17/2022   Procedure: PACEMAKER IMPLANT;  Surgeon: Patrick Maw, MD;  Location: [facility] INVASIVE CV LAB;  Service: Cardiovascular;  Laterality: N/A;   PROSTATE SURGERY     in office Dr Evelene Croon   TEMPORARY PACEMAKER N/A 07/14/2021   Procedure: TEMPORARY PACEMAKER;  Surgeon: Marykay Lex, MD;  Location: [facility] INVASIVE CV LAB;  Service: Cardiovascular;  Laterality: N/A;   TONSILLECTOMY      Current Medications: Outpatient Medications Prior to Visit  Medication Sig Dispense Refill   Acetylcarnitine HCl (ACETYL L-CARNITINE PO) Take 1,500 mg by mouth daily.     ALPHA LIPOIC ACID PO Take 600 mg by mouth daily.     amLODipine (NORVASC) 5 MG tablet Take 5 mg by mouth daily.     aspirin (ASPIRIN LOW DOSE) 81 MG EC tablet Take 1 tablet (81 mg total) by mouth daily. 90 tablet 0   clopidogrel (PLAVIX) 75 MG tablet TAKE 1 TABLET(75 MG) BY MOUTH DAILY 90 tablet 3   Coenzyme Q10 200 MG capsule Take 200 mg by mouth daily.     Cyanocobalamin (VITAMIN B-12 PO) Take by mouth.     folic  acid (FOLVITE) 1 MG tablet Take 1 mg by mouth daily.     Glucosamine-Chondroit-Vit C-Mn (GLUCOSAMINE 1500 COMPLEX PO) Take 1,500 mg by mouth daily.     hydroxychloroquine (PLAQUENIL) 200 MG tablet Take 1 tablet by mouth 2 (two) times daily.     isosorbide mononitrate (IMDUR) 60 MG 24 hr tablet TAKE 1 AND 1/2 TABLETS(90 MG) BY MOUTH DAILY (Patient taking differently: 60 mg daily.) 135 tablet 3   Magnesium 250 MG TABS Take 250 mg by mouth daily.     metFORMIN (GLUCOPHAGE-XR) 500 MG 24 hr tablet Take 1,000 mg by mouth 2  (two) times daily with a meal.     methotrexate (RHEUMATREX) 2.5 MG tablet Take 2.5 mg by mouth as directed. Take 1 tablet for 5 days out of 7, weekly     Multiple Vitamins-Minerals (ABC PLUS SENIOR PO) Take 1 tablet by mouth daily.     nitroGLYCERIN (NITROSTAT) 0.4 MG SL tablet Place 1 tablet (0.4 mg total) under the tongue every 5 (five) minutes x 3 doses as needed for chest pain. 25 tablet 0   Nutritional Supplements (SILICA PO) Take 15 mLs by mouth daily. Plant based collagen booster     nystatin cream (MYCOSTATIN) Apply 1 application. topically daily as needed for dry skin.     Omega-3 Fatty Acids (FISH OIL OMEGA-3 PO) Take 2 capsules by mouth 2 (two) times daily.      Potassium 99 MG TABS Take 99 mg by mouth daily.     pyridOXINE (VITAMIN B-6) 50 MG tablet Take 50 mg by mouth daily.     SEMAGLUTIDE PO Take 1 tablet by mouth daily.     tamsulosin (FLOMAX) 0.4 MG CAPS capsule Take 2 capsules (0.8 mg total) by mouth daily. 180 capsule 3   triamcinolone cream (KENALOG) 0.1 % Apply 1 application. topically daily as needed (rash).     TRULICITY 0.75 MG/0.5ML SOPN Inject 0.75 mg into the skin once a week.     vitamin C (ASCORBIC ACID) 500 MG tablet Take 500 mg by mouth daily.     ranolazine (RANEXA) 1000 MG SR tablet TAKE 1 TABLET(1000 MG) BY MOUTH TWICE DAILY (Patient taking differently: 1,000 mg daily.) 180 tablet 3   mirabegron ER (MYRBETRIQ) 25 MG TB24 tablet Take 1 tablet (25 mg total) by mouth daily. (Patient not taking: Reported on 09/09/2023) 90 tablet 3   pantoprazole (PROTONIX) 40 MG tablet Take 40 mg by mouth daily. (Patient not taking: Reported on 09/09/2023)     rosuvastatin (CRESTOR) 20 MG tablet Take 20 mg by mouth at bedtime. (Patient not taking: Reported on 09/09/2023)     No facility-administered medications prior to visit.     Allergies:   Patient has no known allergies.   Social History   Socioeconomic History   Marital status: Married    Spouse name: Not on file   Number  of children: 3   Years of education: GED   Highest education level: Not on file  Occupational History   Occupation: Editor, commissioning  Tobacco Use   Smoking status: Former    Types: Cigarettes   Smokeless tobacco: Never   Tobacco comments:    Quit smoking at age 56.  Vaping Use   Vaping status: Never Used  Substance and Sexual Activity   Alcohol use: Yes    Alcohol/week: 2.0 standard drinks of alcohol    Types: 2 Shots of liquor per week   Drug use: No   Sexual activity: Not on  file  Other Topics Concern   Not on file  Social History Narrative   Lives at home with his wife.   Right-handed.   Caffeine use: 3.5 cups per day.   Social Determinants of Health   Financial Resource Strain: Not on file  Food Insecurity: Not on file  Transportation Needs: Not on file  Physical Activity: Not on file  Stress: Not on file  Social Connections: Not on file     Family History:  The patient's family history includes Dementia in his mother; Heart attack in his father; Heart disease in his father.   ROS General: Negative; No fevers, chills, or night sweats;  HEENT: Negative; No changes in vision or hearing, sinus congestion, difficulty swallowing Pulmonary: Positive for bronchitis Cardiovascular:   See history of present illness GI: Negative; No nausea, vomiting, diarrhea, or abdominal pain GU: Negative; No dysuria, hematuria, or difficulty voiding Musculoskeletal: Negative; no myalgias, joint pain, or weakness Hematologic/Oncology: Negative; no easy bruising, bleeding Endocrine: Positive for diabetes Neuro: Negative; no changes in balance, headaches Skin: Negative; No rashes or skin lesions Psychiatric: Negative; No behavioral problems, depression Sleep: Recently diagnosed with OSA with initiation of CPAP therapy   Other comprehensive 14 point system review is negative.   PHYSICAL EXAM:   BP 115/60 (BP Location: Right Arm, Patient Position: Sitting, Cuff Size: Large)   Pulse 69    Wt 195 lb (88.5 kg)   SpO2 98%   BMI 27.20 kg/m    Repeat blood pressure by me was 118/64.  Wt Readings from Last 3 Encounters:  09/09/23 195 lb (88.5 kg)  08/02/23 199 lb (90.3 kg)  02/02/23 222 lb 6.4 oz (100.9 kg)    General: Alert, oriented, no distress.  Skin: normal turgor, no rashes, warm and dry HEENT: Normocephalic, atraumatic. Pupils equal round and reactive to light; sclera anicteric; extraocular muscles intact;  Nose without nasal septal hypertrophy Mouth/Parynx benign; Mallinpatti scale 3 Neck: No JVD, no carotid bruits; normal carotid upstroke Lungs: clear to ausculatation and percussion; no wheezing or rales Chest wall: without tenderness to palpitation Heart: PMI not displaced, RRR, s1 s2 normal, 1/6 systolic murmur, no diastolic murmur, no rubs, gallops, thrills, or heaves Abdomen: soft, nontender; no hepatosplenomehaly, BS+; abdominal aorta nontender and not dilated by palpation. Back: no CVA tenderness Pulses 2+ Musculoskeletal: full range of motion, normal strength, no joint deformities Extremities: no clubbing cyanosis or edema, Homan's sign negative  Neurologic: grossly nonfocal; Cranial nerves grossly wnl Psychologic: Normal mood and affect     Studies/Labs Reviewed:   EKG Interpretation Date/Time:  Thursday September 09 2023 16:27:58 EDT Ventricular Rate:  69 PR Interval:  214 QRS Duration:  152 QT Interval:  434 QTC Calculation: 465 R Axis:   43  Text Interpretation: Atrial-paced rhythm with prolonged AV conduction Right bundle branch block When compared with ECG of 17-May-2022 16:28, Electronic atrial pacemaker has replaced Sinus rhythm Confirmed by Nicki Guadalajara ([id number]) on 09/09/2023 5:02:27 PM    February 02, 2023 ECG (independently read by me): AV paced with prolonged AV conduction at 344 msec  September 17, 2022 ECG (independently read by me): NSR at 68, with intermittent Atrial pacing, RBBB  November 18, 2021 ECG (independently read by  me):  NSR at 79, RBBB with repolarization  July 25, 2021: NSR at 85, RBBB, PR 192 msec  July 14, 2021: Complete heart block with RBBB, LPHB, ventricular rate 18 April 2020 ECG in ER (independently read by me): Normal sinus rhythm at 61  bpm.  Right bundle branch block with repolarization changes.  Normal intervals.  No ectopy.  November 07, 2019 EKG:  EKG is ordered today.  Normal sinus rhythm at 67 bpm.  Right bundle branch block with repolarization changes  October 17 2018 ECG (independently read by me): Normal sinus rhythm at 93 bpm.  Right bundle branch block with repolarization changes.  QTc interval 44 ms.  Apr 25, 2018 ECG (independently read by me): Normal sinus rhythm at 73 bpm.  Right bundle branch block with repolarization changes.  March 16, 2018 ECG (independently read by me): NSR at 63;RBBB with repolarization changes QTc 431 msec  November 2018 ECG (independently read by me): Normal sinus rhythm at 66 bpm.  Right bundle branch block with repolarization changes.  QTc interval 448 ms.  Recent Labs:    Latest Ref Rng & Units 05/16/2022    3:24 AM 05/15/2022    4:01 PM 09/01/2021    4:28 PM  BMP  Glucose 70 - 99 mg/dL 098  119  147   BUN 8 - 23 mg/dL 20  27  23    Creatinine 0.61 - 1.24 mg/dL 8.29  5.62  1.30   BUN/Creat Ratio 10 - 24   14   Sodium 135 - 145 mmol/L 137  137  137   Potassium 3.5 - 5.1 mmol/L 3.6  4.7  4.2   Chloride 98 - 111 mmol/L 105  104  99   CO2 22 - 32 mmol/L 26  22  22    Calcium 8.9 - 10.3 mg/dL 8.8  8.7  9.5         Latest Ref Rng & Units 05/15/2022    4:01 PM 07/14/2021   11:59 AM 07/14/2021   11:08 AM  Hepatic Function  Total Protein 6.5 - 8.1 g/dL 6.3  5.2  5.3   Albumin 3.5 - 5.0 g/dL 3.9  3.0  3.1   AST 15 - 41 U/L 40  18  21   ALT 0 - 44 U/L 40  22  21   Alk Phosphatase 38 - 126 U/L 35  29  31   Total Bilirubin 0.3 - 1.2 mg/dL 1.0  1.1  0.9        Latest Ref Rng & Units 05/16/2022    3:24 AM 05/15/2022    4:01 PM 09/01/2021     4:28 PM  CBC  WBC 4.0 - 10.5 K/uL 6.3  7.5  6.3   Hemoglobin 13.0 - 17.0 g/dL 86.5  78.4  69.6   Hematocrit 39.0 - 52.0 % 35.5  39.1  39.2   Platelets 150 - 400 K/uL 169  181  228    Lab Results  Component Value Date   MCV 88.5 05/16/2022   MCV 90.1 05/15/2022   MCV 91 09/01/2021   Lab Results  Component Value Date   TSH 0.732 05/15/2022   Lab Results  Component Value Date   HGBA1C 5.9 (H) 07/14/2021     BNP No results found for: "BNP"  ProBNP No results found for: "PROBNP"   Lipid Panel     Component Value Date/Time   CHOL 79 07/14/2021 1159   TRIG 89 07/14/2021 1159   HDL 27 (L) 07/14/2021 1159   CHOLHDL 2.9 07/14/2021 1159   VLDL 18 07/14/2021 1159   LDLCALC 34 07/14/2021 1159     RADIOLOGY: CUP PACEART REMOTE DEVICE CHECK  Result Date: 08/18/2023 Scheduled remote reviewed. Normal device function.  Within the monitoring period, HF  diagnostics have been normal. Next remote 91 days. - CS, CVRS    Additional studies/ records that were reviewed today include:   Emergent cardiac catheterization/PCI 08/04/2017: Conclusion     LV end diastolic pressure is mildly elevated. The left ventricular systolic function is normal. A STENT RESOLUTE [id number] drug eluting stent was successfully placed. Prox LAD to Mid LAD lesion, 100 %stenosed. Post intervention, there is a 0% residual stenosis.   Acute ST segment elevation myocardial infarction secondary to total occlusion of the LAD immediately after the takeoff of the first diagonal and septal perforating artery.  There was TIMI 0 flow.   Mild luminal irregularity of the left circumflex vessel without significant obstructive stenoses.  Dominant normal RCA.   Successful PCI to the totally occluded LAD which resulted in a long segment of occlusion and ultimately treated with PTCA and DES stenting with a Resolute onyx 3.038 mm stent postdilated to 3.25 mm with 100% occlusion being reduced to 0% and TIMI 0 flow being  improved to TIMI 3 flow.   Transient heart block treated with IV atropine.   Preserved global LV contractility with subtle apical inferior focal hypocontractility.   The patient presented to [facility] emergency room at ~ 8:45 AM.  He presented to [facility] catheterization lab at approximately 10:45 AM.  During balloon time from presentation to [facility] catheterization laboratory was ~ 20 minutes.   RECOMMENDATION: The patient will continue with dual antiplatelet therapy for minimum of a year.  He will be started on high potency statin therapy, low-dose beta blocker therapy, with ultimate ACE inhibition.  Metformin will be held for 48 hours postcontrast.     03/10/2018 Nuclear Study Highlights     The left ventricular ejection fraction is mildly decreased (45-54%). Nuclear stress EF: 45%. There was no ST segment deviation noted during stress. Findings consistent with ischemia. This is a high risk study.   TID 1.3 Normalization artifact likely but appears to be both inferior and anterior ischemia Diffuse hypokinesis EF 45%      March 21, 2018 cardiac catheterization conclusion     Previously placed Prox LAD to Mid LAD stent (unknown type) is widely patent. The left ventricular systolic function is normal. LV end diastolic pressure is normal. The left ventricular ejection fraction is 50-55% by visual estimate. There is no mitral valve regurgitation.   Preserved global LV contractility with a small region of very mild residual mid anterolateral hypocontractility.  Ejection fraction 50-55%.   No significant coronary obstructive disease with evidence for widely patent mid LAD stent after the takeoff of a first diagonal vessel, normal left circumflex and normal dominant RCA.   RECOMMENDATION: Medical therapy.  Continue DAPT in this patient status post anterior ST segment MI August 2018.  Optimize blood pressure.  Continue high potency statin therapy.      NUCLEAR STUDY 10/19/2019 The  left ventricular ejection fraction is normal (55-65%). Nuclear stress EF: 59%. There was no ST segment deviation noted during stress. Defect 1: There is a medium defect of mild severity present in the basal inferior and mid inferior location. Findings consistent with ischemia. This is a low risk study.   Abnormal, low risk stress nuclear study with mild inferior ischemia.  Gated ejection fraction 59% with normal wall motion.   ASSESSMENT:    1. ST elevation myocardial infarction involving left anterior descending (LAD) coronary artery [facility]): August 04, 2017   2. Essential hypertension   3. Complete heart block (HCC)   4. Pacemaker  5. OSA (obstructive sleep apnea)   6. Type 2 diabetes mellitus with complication, without long-term current use of insulin (HCC)   7. Rheumatoid arthritis, involving unspecified site, unspecified whether rheumatoid factor present [facility])     PLAN:  Mr. Kycen Rabara is a 67 year old gentleman who has a history of hypertension, hyperlipidemia, and diabetes mellitus. On 08/04/2017 he presented with an acute anterior wall ST segment elevation myocardial infarctions and was found to have total occlusion of his LAD after the first diagonal vessel.  He was found to have a long occluded segment which was successfully treated with DES stenting with a 3.038 mm Resolute DES stent.  A nuclear perfusion study in March 2019 suggested the possibility of ischemia involving both the anterior and inferior wall.  EF was 45% and there was diffuse hypokinesis.  With his blood pressure elevation, and nuclear study suggestive of ischemia, I added amlodipine 5 mg to his regimen and further titrated metoprolol to 37.5 mg twice a day.  He underwent definitive repeat cardiac catheterization on 03/21/2018 which revealed a widely patent mid LAD stent and a normal left circumflex and dominant RCA.  He subsequently experienced recurrent chest pain symptomatology with plus minus atypical features.  A  subsequent nuclear perfusion study was low risk and raised concern for possible mild inferior ischemia.  I suspect this most likely is diaphragmatic attenuation.  At his last catheterization his RCA and circumflex vessels were entirely normal.  He had normal perfusion in the LAD territory and at last cath his LAD stent was widely patent.  Remotely, he had developed complete heart block with marked bradycardia requiring initial temporary pacemaker but heart rhythm stabilized following discontinuance of beta-blocker therapy in April 2022.  He has subsequently developed recurrent complete heart block with symptomatic bradycardia and  underwent permanent pacemaker implantation with a Biotronik dual chamber pacemaker implanted on May 17, 2022 by Dr. Sharrell [name].  He undergoes quarterly pacemaker checks remotely which have been stable.  Since his last evaluation with me he has lost approximately 30 pounds.  He has been followed by Patrick [name] at [place] [facility].  He was diagnosed with both rheumatoid and osteoarthritis and is now on Plaquenil in addition to methotrexate.  He has continued to be on DAPT with his CAD.  He apparently now is on a "anti-inflammatory diet" and avoids gluten, sugars, and has been eating mainly fruits and vegetables.  Previously, his LDL level had been reduced to 33 with statin therapy.  He apparently is now off rosuvastatin.  I have suggested that he at least achieve a target LDL less than 70 and when Patrick [name] rechecks his laboratory if LDL is above 70 he should at least resume low-dose rosuvastatin.  At times, he has noticed some vague chest pain which has not increased but perhaps is more aware of this on his reduction of Ranexa.  I have suggested he resume his prior dose of 1000 mg twice a day.  He will be following up with Patrick [name] and is on CPAP therapy with reportedly excellent compliance.  I will see him in April 2026 for follow-up evaluation.    tedication  Adjustments/Labs and Tests Ordered: Current medicines are reviewed at length with the patient today.  Concerns regarding medicines are outlined above.  Medication changes, Labs and Tests ordered today are listed in the Patient Instructions below. Patient Instructions  Medication Instructions:  *If you need a refill on your cardiac medications before your next appointment, please call your  pharmacy*   Lab Work: No labs ordered today If you have labs (blood work) drawn today and your tests are completely normal, you will receive your results only by: MyChart Message (if you have MyChart) OR A paper copy in the mail If you have any lab test that is abnormal or we need to change your treatment, we will call you to review the results.   Testing/Procedures: No testing ordered today   Follow-Up: At [facility], you and your health needs are our priority.  As part of our continuing mission to provide you with exceptional heart care, we have created designated Provider Care Teams.  These Care Teams include your primary Cardiologist (physician) and Advanced Practice Providers (APPs -  Physician Assistants and Nurse Practitioners) who all work together to provide you with the care you need, when you need it.  We recommend signing up for the patient portal called "MyChart".  Sign up information is provided on this After Visit Summary.  MyChart is used to connect with patients for Virtual Visits (Telemedicine).  Patients are able to view lab/test results, encounter notes, upcoming appointments, etc.  Non-urgent messages can be sent to your provider as well.   To learn more about what you can do with MyChart, go to [email or web address].    Your next appointment:   6 month(s)  Provider:   Nicki Guadalajara, MD        Signed, Nicki Guadalajara, MD  09/11/2023 6:46 PM    [facility] [address], [place], [place]  [address] Phone: [phone]

## 2023-09-09 NOTE — Patient Instructions (Addendum)
Medication Instructions:  *If you need a refill on your cardiac medications before your next appointment, please call your pharmacy*   Lab Work: No labs ordered today If you have labs (blood work) drawn today and your tests are completely normal, you will receive your results only by: MyChart Message (if you have MyChart) OR A paper copy in the mail If you have any lab test that is abnormal or we need to change your treatment, we will call you to review the results.   Testing/Procedures: No testing ordered today   Follow-Up: At [facility], you and your health needs are our priority.  As part of our continuing mission to provide you with exceptional heart care, we have created designated Provider Care Teams.  These Care Teams include your primary Cardiologist (physician) and Advanced Practice Providers (APPs -  Physician Assistants and Nurse Practitioners) who all work together to provide you with the care you need, when you need it.  We recommend signing up for the patient portal called "MyChart".  Sign up information is provided on this After Visit Summary.  MyChart is used to connect with patients for Virtual Visits (Telemedicine).  Patients are able to view lab/test results, encounter notes, upcoming appointments, etc.  Non-urgent messages can be sent to your provider as well.   To learn more about what you can do with MyChart, go to [email or web address].    Your next appointment:   6 month(s)  Provider:   Nicki Guadalajara, MD

## 2023-09-11 ENCOUNTER — Encounter: Payer: Self-pay | Admitting: Cardiovascular Disease

## 2023-10-04 ENCOUNTER — Ambulatory Visit: Payer: Medicare Other | Attending: Internal Medicine | Admitting: Internal Medicine

## 2023-10-04 ENCOUNTER — Encounter: Payer: Self-pay | Admitting: Internal Medicine

## 2023-10-04 VITALS — BP 114/68 | HR 80 | Ht 71.0 in | Wt 204.0 lb

## 2023-10-04 DIAGNOSIS — Z79899 Other long term (current) drug therapy: Secondary | ICD-10-CM | POA: Diagnosis present

## 2023-10-04 MED ORDER — APIXABAN 5 MG PO TABS: 5.0000 mg | ORAL_TABLET | Freq: Two times a day (BID) | ORAL | 3 refills | Status: DC

## 2023-10-04 NOTE — Patient Instructions (Addendum)
Medication Instructions:  Your physician has recommended you make the following change in your medication:  STOP aspirin and plavix Start eliquis 5 mg 1 tablet by mouth twice daily   Lab Work: None ordered.  If you have labs (blood work) drawn today and your tests are completely normal, you will receive your results only by: MyChart Message (if you have MyChart) OR A paper copy in the mail If you have any lab test that is abnormal or we need to change your treatment, we will call you to review the results.  Testing/Procedures: None ordered.  Follow-Up: At [facility], you and your health needs are our priority.  As part of our continuing mission to provide you with exceptional heart care, we have created designated Provider Care Teams.  These Care Teams include your primary Cardiologist (physician) and Advanced Practice Providers (APPs -  Physician Assistants and Nurse Practitioners) who all work together to provide you with the care you need, when you need it.  Your next appointment:   1 year(s)  The format for your next appointment:   In Person  Provider:   Lewayne Bunting, MD{or one of the following Advanced Practice Providers on your designated Care Team:   Francis Dowse, [place] Casimiro Needle "Mardelle Matte" [place], [place] Earnest Rosier, NP  Remote monitoring is used to monitor your Pacemaker/ ICD from home. This monitoring reduces the number of office visits required to check your device to one time per year. It allows [place] to keep an eye on the functioning of your device to ensure it is working properly.   Important Information About Sugar

## 2023-10-04 NOTE — Progress Notes (Signed)
HPI Mr. Patrick [name] returns today for folllowup. The patient is a very pleasant 67 yo man with a h/o CAD, s/p MI with preserved LV function, CHB, s/p PPM insertion. The patient denies anginal symptoms and sob. No syncope. He just got back from hiking in the [place] mountains. He feels well. No palpitations, chest pain or sob. No edema. No Known Allergies   Current Outpatient Medications  Medication Sig Dispense Refill   Acetylcarnitine HCl (ACETYL L-CARNITINE PO) Take 1,500 mg by mouth daily.     ALPHA LIPOIC ACID PO Take 600 mg by mouth daily.     amLODipine (NORVASC) 5 MG tablet Take 5 mg by mouth daily.     apixaban (ELIQUIS) 5 MG TABS tablet Take 1 tablet (5 mg total) by mouth 2 (two) times daily. 180 tablet 3   Coenzyme Q10 200 MG capsule Take 200 mg by mouth daily.     Cyanocobalamin (VITAMIN B-12 PO) Take by mouth.     folic acid (FOLVITE) 1 MG tablet Take 1 mg by mouth daily.     Glucosamine-Chondroit-Vit C-Mn (GLUCOSAMINE 1500 COMPLEX PO) Take 1,500 mg by mouth daily.     hydroxychloroquine (PLAQUENIL) 200 MG tablet Take 1 tablet by mouth 2 (two) times daily.     isosorbide mononitrate (IMDUR) 60 MG 24 hr tablet TAKE 1 AND 1/2 TABLETS(90 MG) BY MOUTH DAILY (Patient taking differently: 60 mg daily.) 135 tablet 3   losartan-hydrochlorothiazide (HYZAAR) 100-12.5 MG tablet Take 1 tablet by mouth daily.     Magnesium 250 MG TABS Take 250 mg by mouth daily.     metFORMIN (GLUCOPHAGE-XR) 500 MG 24 hr tablet Take 1,000 mg by mouth 2 (two) times daily with a meal.     methotrexate (RHEUMATREX) 2.5 MG tablet Take 2.5 mg by mouth as directed. Take 1 tablet for 5 days out of 7, weekly     mirabegron ER (MYRBETRIQ) 25 MG TB24 tablet Take 1 tablet (25 mg total) by mouth daily. 90 tablet 3   Multiple Vitamins-Minerals (ABC PLUS SENIOR PO) Take 1 tablet by mouth daily.     nitroGLYCERIN (NITROSTAT) 0.4 MG SL tablet Place 1 tablet (0.4 mg total) under the tongue every 5 (five) minutes x 3 doses as  needed for chest pain. 25 tablet 0   Nutritional Supplements (SILICA PO) Take 15 mLs by mouth daily. Plant based collagen booster     nystatin cream (MYCOSTATIN) Apply 1 application. topically daily as needed for dry skin.     Omega-3 Fatty Acids (FISH OIL OMEGA-3 PO) Take 2 capsules by mouth 2 (two) times daily.      pantoprazole (PROTONIX) 40 MG tablet Take 40 mg by mouth daily.     Potassium 99 MG TABS Take 99 mg by mouth daily.     pyridOXINE (VITAMIN B-6) 50 MG tablet Take 50 mg by mouth daily.     ranolazine (RANEXA) 1000 MG SR tablet Take 1 tablet (1,000 mg total) by mouth 2 (two) times daily. TAKE 1 TABLET(1000 MG) BY MOUTH TWICE DAILY Strength: 1,000 mg 180 tablet 3   rosuvastatin (CRESTOR) 20 MG tablet Take 20 mg by mouth at bedtime.     tamsulosin (FLOMAX) 0.4 MG CAPS capsule Take 2 capsules (0.8 mg total) by mouth daily. 180 capsule 3   triamcinolone cream (KENALOG) 0.1 % Apply 1 application. topically daily as needed (rash).     TRULICITY 0.75 MG/0.5ML SOPN Inject 0.75 mg into the skin once a week.  vitamin C (ASCORBIC ACID) 500 MG tablet Take 500 mg by mouth daily.     No current facility-administered medications for this visit.     Past Medical History:  Diagnosis Date   CAD in native artery    a. anterolateral STEMI 07/2017 s/p DES to prox-mid LAD, EF 55%.   GERD (gastroesophageal reflux disease)    History of kidney stones    Hyperlipidemia    Hypertension    Myocardial infarction [facility])    Overactive bladder    Presence of permanent cardiac pacemaker    Renal calculi    Rheumatoid arthritis (HCC)    Syncope    Uncontrolled diabetes mellitus     ROS:   All systems reviewed and negative except as noted in the HPI.   Past Surgical History:  Procedure Laterality Date   CORONARY/GRAFT ACUTE MI REVASCULARIZATION N/A 08/04/2017   Procedure: Coronary/Graft Acute MI Revascularization;  Surgeon: Lennette Bihari, MD;  Location: [facility] INVASIVE CV LAB;  Service:  Cardiovascular;  Laterality: N/A;   LEFT HEART CATH AND CORONARY ANGIOGRAPHY N/A 08/04/2017   Procedure: LEFT HEART CATH AND CORONARY ANGIOGRAPHY;  Surgeon: Lennette Bihari, MD;  Location: [facility] INVASIVE CV LAB;  Service: Cardiovascular;  Laterality: N/A;   LEFT HEART CATH AND CORONARY ANGIOGRAPHY N/A 03/21/2018   Procedure: LEFT HEART CATH AND CORONARY ANGIOGRAPHY;  Surgeon: Lennette Bihari, MD;  Location: [facility] INVASIVE CV LAB;  Service: Cardiovascular;  Laterality: N/A;   PACEMAKER IMPLANT N/A 05/17/2022   Procedure: PACEMAKER IMPLANT;  Surgeon: Marinus Maw, MD;  Location: [facility] INVASIVE CV LAB;  Service: Cardiovascular;  Laterality: N/A;   PROSTATE SURGERY     in office Dr Evelene Croon   TEMPORARY PACEMAKER N/A 07/14/2021   Procedure: TEMPORARY PACEMAKER;  Surgeon: Marykay Lex, MD;  Location: [facility] INVASIVE CV LAB;  Service: Cardiovascular;  Laterality: N/A;   TONSILLECTOMY       Family History  Problem Relation Age of Onset   Dementia Mother    Heart disease Father    Heart attack Father      Social History   Socioeconomic History   Marital status: Married    Spouse name: Not on file   Number of children: 3   Years of education: GED   Highest education level: Not on file  Occupational History   Occupation: Editor, commissioning  Tobacco Use   Smoking status: Former    Types: Cigarettes   Smokeless tobacco: Never   Tobacco comments:    Quit smoking at age 80.  Vaping Use   Vaping status: Never Used  Substance and Sexual Activity   Alcohol use: Yes    Alcohol/week: 2.0 standard drinks of alcohol    Types: 2 Shots of liquor per week   Drug use: No   Sexual activity: Not on file  Other Topics Concern   Not on file  Social History Narrative   Lives at home with his wife.   Right-handed.   Caffeine use: 3.5 cups per day.   Social Determinants of Health   Financial Resource Strain: Not on file  Food Insecurity: Not on file  Transportation Needs: Not on file  Physical Activity:  Not on file  Stress: Not on file  Social Connections: Not on file  Intimate Partner Violence: Not on file     BP 114/68   Pulse 80   Ht 5\' 11"  (1.803 m)   Wt 204 lb (92.5 kg)   SpO2 95%   BMI 28.45  kg/m   Physical Exam:  Well appearing 67 yo man, NAD HEENT: Unremarkable Neck:  No JVD, no thyromegally Lymphatics:  No adenopathy Back:  No CVA tenderness Lungs:  Clear with no wheezes HEART:  Regular rate rhythm, no murmurs, no rubs, no clicks Abd:  soft, positive bowel sounds, no organomegally, no rebound, no guarding Ext:  2 plus pulses, no edema, no cyanosis, no clubbing Skin:  No rashes no nodules Neuro:  CN II through XII intact, motor grossly intact  DEVICE  Normal device function.  See PaceArt for details.   Assess/Plan: PAF - he has had a several hour episode of PAF. I have recommended initiation of eliquis. He will stop the asa and plavix. CAD - he is s/p PCI in 2018. He denies anginal symptoms. He will stop the asa and plavix in light of starting eliqius for PAF. Dyslipidemia - he will continue his current meds.  Sharlot Gowda [place],MD

## 2023-10-08 ENCOUNTER — Encounter: Payer: Self-pay | Admitting: Internal Medicine

## 2023-10-12 ENCOUNTER — Other Ambulatory Visit (HOSPITAL_COMMUNITY): Payer: Self-pay

## 2023-10-13 ENCOUNTER — Telehealth: Payer: Self-pay | Admitting: Pharmacy Technician

## 2023-10-13 ENCOUNTER — Other Ambulatory Visit (HOSPITAL_COMMUNITY): Payer: Self-pay

## 2023-10-13 NOTE — Telephone Encounter (Signed)
Appeal has been submitted. Will advise when response is received or follow up in 1 week. Please be advised that most companies may take 30 days to make a decision.

## 2023-10-19 NOTE — Telephone Encounter (Signed)
Pt contacted by Patrick [name] about appeal. Patient seen message on 10/15/23 at 1:10 pm. Please see other phone note.

## 2023-10-21 ENCOUNTER — Other Ambulatory Visit (HOSPITAL_COMMUNITY): Payer: Self-pay

## 2023-10-21 NOTE — Telephone Encounter (Addendum)
Pharmacy Patient Advocate Encounter  Received notification from  [employer]  that Prior Authorization for Eliquis has been APPROVED from 10/21/23 to 12/20/24. Ran test claim, Copay is $149.53- one month (GAP). This test claim was processed through [facility]- copay amounts may vary at other pharmacies due to pharmacy/plan contracts, or as the patient moves through the different stages of their insurance plan.   PA #/Case ID/Reference #: [id number]

## 2023-10-27 ENCOUNTER — Other Ambulatory Visit: Payer: Self-pay

## 2023-10-27 DIAGNOSIS — R972 Elevated prostate specific antigen [PSA]: Secondary | ICD-10-CM

## 2023-10-27 DIAGNOSIS — N138 Other obstructive and reflux uropathy: Secondary | ICD-10-CM

## 2023-10-28 ENCOUNTER — Other Ambulatory Visit: Payer: Medicare Other

## 2023-10-28 DIAGNOSIS — R972 Elevated prostate specific antigen [PSA]: Secondary | ICD-10-CM

## 2023-10-28 DIAGNOSIS — N138 Other obstructive and reflux uropathy: Secondary | ICD-10-CM

## 2023-10-28 NOTE — Progress Notes (Deleted)
11/02/2023 2:07 PM   Patrick [name] 1956-11-21 [id number]  Referring provider: Lauro Regulus, MD [address] [facility] [address] [place],  [place] [address]  Urological history: 1. Elevated PSA - PSA (10/2023) *** - biopsy 2004 iPSA 6.8 negative - biopsy 2008 iPSA 6.3 negative  2. BPH with LU TS -~15 years ago TUMT - Managed with tamsulosin 0.4 mg twice daily  3. Pelvic floor dysfunction - documented increased EMG activity with all voids from urodynamics in 2018 - underwent PT  4. Nocturia - managed with Myrbetriq 25 mg daily - not interested in sleep study  5. ED -contributing factors of age, CAD, diabetes, HLD, HTN and former smoker -using alternative measures for sexual activity   6. Nephrolithiasis -stone composition 100% calcium oxalate -spontaneous passage of 8 mm left UVJ stone (08/2022)   No chief complaint on file.   HPI: Patrick [name] is a 67 y.o. male who presents today for yearly follow up.    Previous records reviewed.   I PSS ***  PVR ***   Score:  1-7 Mild 8-19 Moderate 20-35 Severe   SHIM ***    Score: 1-7 Severe ED 8-11 Moderate ED 12-16 Mild-Moderate ED 17-21 Mild ED 22-25 No ED   PMH: Past Medical History:  Diagnosis Date   CAD in native artery    a. anterolateral STEMI 07/2017 s/p DES to prox-mid LAD, EF 55%.   GERD (gastroesophageal reflux disease)    History of kidney stones    Hyperlipidemia    Hypertension    Myocardial infarction [facility])    Overactive bladder    Presence of permanent cardiac pacemaker    Renal calculi    Rheumatoid arthritis (HCC)    Syncope    Uncontrolled diabetes mellitus     Surgical History: Past Surgical History:  Procedure Laterality Date   CORONARY/GRAFT ACUTE MI REVASCULARIZATION N/A 08/04/2017   Procedure: Coronary/Graft Acute MI Revascularization;  Surgeon: Lennette Bihari, MD;  [facility];  Service: Cardiovascular;  Laterality: N/A;   LEFT HEART  CATH AND CORONARY ANGIOGRAPHY N/A 08/04/2017   Procedure: LEFT HEART CATH AND CORONARY ANGIOGRAPHY;  Surgeon: Lennette Bihari, MD;  [facility];  Service: Cardiovascular;  Laterality: N/A;   LEFT HEART CATH AND CORONARY ANGIOGRAPHY N/A 03/21/2018   Procedure: LEFT HEART CATH AND CORONARY ANGIOGRAPHY;  Surgeon: Lennette Bihari, MD;  [facility];  Service: Cardiovascular;  Laterality: N/A;   PACEMAKER IMPLANT N/A 05/17/2022   Procedure: PACEMAKER IMPLANT;  Surgeon: Marinus Maw, MD;  [facility];  Service: Cardiovascular;  Laterality: N/A;   PROSTATE SURGERY     in office Dr Evelene Croon   TEMPORARY PACEMAKER N/A 07/14/2021   Procedure: TEMPORARY PACEMAKER;  Surgeon: Marykay Lex, MD;  Location: [facility] INVASIVE CV LAB;  Service: Cardiovascular;  Laterality: N/A;   TONSILLECTOMY      Home Medications:  Allergies as of 11/02/2023   No Known Allergies      Medication List        Accurate as of October 28, 2023  2:07 PM. If you have any questions, ask your nurse or doctor.          ABC PLUS SENIOR PO Take 1 tablet by mouth daily.   ACETYL L-CARNITINE PO Take 1,500 mg by mouth daily.   ALPHA LIPOIC ACID PO Take 600 mg by mouth daily.   amLODipine 5 MG tablet Commonly  known as: NORVASC Take 5 mg by mouth daily.   apixaban 5 MG Tabs tablet Commonly known as: ELIQUIS Take 1 tablet (5 mg total) by mouth 2 (two) times daily.   ascorbic acid 500 MG tablet Commonly known as: VITAMIN C Take 500 mg by mouth daily.   Coenzyme Q10 200 MG capsule Take 200 mg by mouth daily.   FISH OIL OMEGA-3 PO Take 2 capsules by mouth 2 (two) times daily.   folic acid 1 MG tablet Commonly known as: FOLVITE Take 1 mg by mouth daily.   GLUCOSAMINE 1500 COMPLEX PO Take 1,500 mg by mouth daily.   hydroxychloroquine 200 MG tablet Commonly known as: PLAQUENIL Take 1 tablet by mouth 2 (two) times daily.   isosorbide mononitrate 60 MG 24 hr  tablet Commonly known as: IMDUR TAKE 1 AND 1/2 TABLETS(90 MG) BY MOUTH DAILY What changed: See the new instructions.   losartan-hydrochlorothiazide 100-12.5 MG tablet Commonly known as: HYZAAR Take 1 tablet by mouth daily.   Magnesium 250 MG Tabs Take 250 mg by mouth daily.   metFORMIN 500 MG 24 hr tablet Commonly known as: GLUCOPHAGE-XR Take 1,000 mg by mouth 2 (two) times daily with a meal.   methotrexate 2.5 MG tablet Commonly known as: RHEUMATREX Take 2.5 mg by mouth as directed. Take 1 tablet for 5 days out of 7, weekly   mirabegron ER 25 MG Tb24 tablet Commonly known as: MYRBETRIQ Take 1 tablet (25 mg total) by mouth daily.   nitroGLYCERIN 0.4 MG SL tablet Commonly known as: NITROSTAT Place 1 tablet (0.4 mg total) under the tongue every 5 (five) minutes x 3 doses as needed for chest pain.   nystatin cream Commonly known as: MYCOSTATIN Apply 1 application. topically daily as needed for dry skin.   pantoprazole 40 MG tablet Commonly known as: PROTONIX Take 40 mg by mouth daily.   Potassium 99 MG Tabs Take 99 mg by mouth daily.   pyridOXINE 50 MG tablet Commonly known as: VITAMIN B6 Take 50 mg by mouth daily.   ranolazine 1000 MG SR tablet Commonly known as: RANEXA Take 1 tablet (1,000 mg total) by mouth 2 (two) times daily. TAKE 1 TABLET(1000 MG) BY MOUTH TWICE DAILY Strength: 1,000 mg   rosuvastatin 20 MG tablet Commonly known as: CRESTOR Take 20 mg by mouth at bedtime.   SILICA PO Take 15 mLs by mouth daily. Plant based collagen booster   tamsulosin 0.4 MG Caps capsule Commonly known as: FLOMAX Take 2 capsules (0.8 mg total) by mouth daily.   triamcinolone cream 0.1 % Commonly known as: KENALOG Apply 1 application. topically daily as needed (rash).   Trulicity 0.75 MG/0.5ML Soaj Generic drug: Dulaglutide Inject 0.75 mg into the skin once a week.   VITAMIN B-12 PO Take by mouth.        Allergies: No Known Allergies  Family  History: Family History  Problem Relation Age of Onset   Dementia Mother    Heart disease Father    Heart attack Father     Social History:  reports that he has quit smoking. His smoking use included cigarettes. He has never used smokeless tobacco. He reports current alcohol use of about 2.0 standard drinks of alcohol per week. He reports that he does not use drugs.  ROS: For pertinent review of systems please refer to history of present illness  Physical Exam: There were no vitals taken for this visit.  Constitutional:  Well nourished. Alert and oriented, No acute distress.  HEENT: [place] AT, moist mucus membranes.  Trachea midline, no masses. Cardiovascular: No clubbing, cyanosis, or edema. Respiratory: Normal respiratory effort, no increased work of breathing. GI: Abdomen is soft, non tender, non distended, no abdominal masses. Liver and spleen not palpable.  No hernias appreciated.  Stool sample for occult testing is not indicated.   GU: No CVA tenderness.  No bladder fullness or masses.  Patient with circumcised/uncircumcised phallus. ***Foreskin easily retracted***  Urethral meatus is patent.  No penile discharge. No penile lesions or rashes. Scrotum without lesions, cysts, rashes and/or edema.  Testicles are located scrotally bilaterally. No masses are appreciated in the testicles. Left and right epididymis are normal. Rectal: Patient with  normal sphincter tone. Anus and perineum without scarring or rashes. No rectal masses are appreciated. Prostate is approximately *** grams, *** nodules are appreciated. Seminal vesicles are normal. Skin: No rashes, bruises or suspicious lesions. Lymph: No cervical or inguinal adenopathy. Neurologic: Grossly intact, no focal deficits, moving all 4 extremities. Psychiatric: Normal mood and affect.   Laboratory Data: CBC w/auto Differential (5 Part) Order: [id number] Component Ref Range & Units 3 wk ago  WBC (White Blood Cell Count) 4.1 - 10.2  10^3/uL 4.4  RBC (Red Blood Cell Count) 4.69 - 6.13 10^6/uL 3.95 Low   Hemoglobin 14.1 - 18.1 gm/dL 13 Low   Hematocrit 40.1 - 52.0 % 38.1 Low   MCV (Mean Corpuscular Volume) 80.0 - 100.0 fl 96.5  MCH (Mean Corpuscular Hemoglobin) 27.0 - 31.2 pg 32.9 High   MCHC (Mean Corpuscular Hemoglobin Concentration) 32.0 - 36.0 gm/dL 02.7  Platelet Count 253 - 450 10^3/uL 198  RDW-CV (Red Cell Distribution Width) 11.6 - 14.8 % 13.4  MPV (Mean Platelet Volume) 9.4 - 12.4 fl 9.5  Neutrophils 1.50 - 7.80 10^3/uL 2.46  Lymphocytes 1.00 - 3.60 10^3/uL 1.52  Monocytes 0.00 - 1.50 10^3/uL 0.3  Eosinophils 0.00 - 0.55 10^3/uL 0.05  Basophils 0.00 - 0.09 10^3/uL 0.03  Neutrophil % 32.0 - 70.0 % 56.3  Lymphocyte % 10.0 - 50.0 % 34.8  Monocyte % 4.0 - 13.0 % 6.9  Eosinophil % 1.0 - 5.0 % 1.1  Basophil% 0.0 - 2.0 % 0.7  Immature Granulocyte % <=0.7 % 0.2  Immature Granulocyte Count <=0.06 10^3/L 0.01  Resulting Agency [facility]   Specimen Collected: 10/07/23 15:37   Performed by: [facility] Last Resulted: 10/07/23 16:56  Received From: [facility]  Result Received: 10/11/23 20:05    Creatinine Order: [id number] Component Ref Range & Units 3 wk ago  Creatinine 0.7 - 1.3 mg/dL 1.2  Glomerular Filtration Rate (eGFR) >60 mL/min/1.73sq m 65  Comment: CKD-EPI (2021) does not include patient's race in the calculation of eGFR.  Monitoring changes of plasma creatinine and eGFR over time is useful for monitoring kidney function.  Interpretive Ranges for eGFR (CKD-EPI 2021):  eGFR:       >60 mL/min/1.73 sq. m - Normal eGFR:       30-59 mL/min/1.73 sq. m - Moderately Decreased eGFR:       15-29 mL/min/1.73 sq. m  - Severely Decreased eGFR:       < 15 mL/min/1.73 sq. m  - Kidney Failure   Note: These eGFR calculations do not apply in acute situations when eGFR is changing rapidly or patients on dialysis.  Resulting Agency [facility] - [facility]   Specimen Collected: 10/07/23 15:37   Performed by: [facility] Last Resulted: 10/07/23  17:06  Received From: [facility]  Result Received: 10/11/23 20:05  I have reviewed the labs.   Pertinent Imaging ***  Assessment & Plan:    1. BPH with LUTS -PSA stable  -DRE benign -UA benign  -symptoms - frequency -continue conservative management, avoiding bladder irritants and timed voiding's -Continue tamsulosin 0.4 mg twice daily  2. Nocturia -deferred sleep study -managed with Myrbetriq 25 mg nightly  3. Erectile dysfunction -using alternative methods for intimacy      No follow-ups on file.  Cloretta Ned  [facility] [address], [address] [place], [place] [address] [phone]

## 2023-10-29 LAB — PSA: Prostate Specific Ag, Serum: 3.7 ng/mL (ref 0.0–4.0)

## 2023-11-02 ENCOUNTER — Ambulatory Visit: Payer: Medicare Other | Admitting: Urology

## 2023-11-02 DIAGNOSIS — N138 Other obstructive and reflux uropathy: Secondary | ICD-10-CM

## 2023-11-02 DIAGNOSIS — R351 Nocturia: Secondary | ICD-10-CM

## 2023-11-02 DIAGNOSIS — N529 Male erectile dysfunction, unspecified: Secondary | ICD-10-CM

## 2023-11-04 ENCOUNTER — Encounter: Payer: Self-pay | Admitting: Cardiovascular Disease

## 2023-11-17 ENCOUNTER — Ambulatory Visit: Payer: Medicare Other

## 2023-11-17 DIAGNOSIS — I442 Atrioventricular block, complete: Secondary | ICD-10-CM

## 2023-11-17 LAB — CUP PACEART REMOTE DEVICE CHECK
Battery Voltage: 90
Date Time Interrogation Session: 20241127082144
Implantable Lead Connection Status: 753985
Implantable Lead Connection Status: 753985
Implantable Lead Implant Date: 20230528
Implantable Lead Implant Date: 20230528
Implantable Lead Location: 753859
Implantable Lead Location: 753860
Implantable Lead Model: 377169
Implantable Lead Model: 377171
Implantable Lead Serial Number: 8000368134
Implantable Lead Serial Number: 8000749513
Implantable Pulse Generator Implant Date: 20230528
Pulse Gen Model: 407145
Pulse Gen Serial Number: 70380012

## 2023-11-30 ENCOUNTER — Ambulatory Visit: Payer: Medicare Other | Admitting: Urology

## 2023-12-05 NOTE — Progress Notes (Unsigned)
12/07/2023 9:25 PM   Patrick [name] 1956-05-27 [id number]  Referring provider: Lauro Regulus, MD [address] [facility] [address] [place],  [place] [address]  Urological history: 1.  PSA (10/2023) 3.7 - biopsy 2004 iPSA 6.8 negative - biopsy 2008 iPSA 6.3 negative   2. BPH with LU TS -~15 years ago TUMT - Managed with tamsulosin 0.4 mg twice daily   3. Pelvic floor dysfunction - documented increased EMG activity with all voids from urodynamics in 2018 - underwent PT   4. Nocturia - managed with Myrbetriq 25 mg daily - not interested in sleep study   5. ED -contributing factors of age, CAD, diabetes, HLD, HTN and former smoker -not a candidate for PDE5i's secondary to nitrate use -failed ICI -using alternative measures for sexual activity    6. Nephrolithiasis -stone composition 100% calcium oxalate -spontaneous passage of 8 mm left UVJ stone (08/2022)   No chief complaint on file.  HPI: Patrick [name] is a 67 y.o. male who presents today for one year follow up.    Previous records reviewed.   I PSS ***  PVR ***    Score:  1-7 Mild 8-19 Moderate 20-35 Severe    SHIM ***    Score: 1-7 Severe ED 8-11 Moderate ED 12-16 Mild-Moderate ED 17-21 Mild ED 22-25 No ED   PMH: Past Medical History:  Diagnosis Date   CAD in native artery    a. anterolateral STEMI 07/2017 s/p DES to prox-mid LAD, EF 55%.   GERD (gastroesophageal reflux disease)    History of kidney stones    Hyperlipidemia    Hypertension    Myocardial infarction [facility])    Overactive bladder    Presence of permanent cardiac pacemaker    Renal calculi    Rheumatoid arthritis (HCC)    Syncope    Uncontrolled diabetes mellitus     Surgical History: Past Surgical History:  Procedure Laterality Date   CORONARY/GRAFT ACUTE MI REVASCULARIZATION N/A 08/04/2017   Procedure: Coronary/Graft Acute MI Revascularization;  Surgeon: Lennette Bihari, MD;  [facility]  CV LAB;  Service: Cardiovascular;  Laterality: N/A;   LEFT HEART CATH AND CORONARY ANGIOGRAPHY N/A 08/04/2017   Procedure: LEFT HEART CATH AND CORONARY ANGIOGRAPHY;  Surgeon: Lennette Bihari, MD;  [facility]VAS[facility] CV LAB;  Service: Cardiovascular;  Laterality: N/A;   LEFT HEART CATH AND CORONARY ANGIOGRAPHY N/A 03/21/2018   Procedure: LEFT HEART CATH AND CORONARY ANGIOGRAPHY;  Surgeon: Lennette Bihari, MD;  [facility] CV [facility];  Service: Cardiovascular;  Laterality: N/A;   PACEMAKER IMPLANT N/A 05/17/2022   Procedure: PACEMAKER IMPLANT;  Surgeon: Marinus Maw, MD;  [facility];  Service: Cardiovascular;  Laterality: N/A;   PROSTATE SURGERY     in office Dr Evelene Croon   TEMPORARY PACEMAKER N/A 07/14/2021   Procedure: TEMPORARY PACEMAKER;  Surgeon: Marykay Lex, MD;  [facility] INVASIVE CV LAB;  Service: Cardiovascular;  Laterality: N/A;   TONSILLECTOMY      Home Medications:  Allergies as of 12/07/2023   No Known Allergies      Medication List        Accurate as of December 05, 2023  9:25 PM. If you have any questions, ask your nurse or doctor.          ABC PLUS SENIOR PO Take 1 tablet by mouth daily.   ACETYL L-CARNITINE PO Take 1,500 mg by mouth daily.  ALPHA LIPOIC ACID PO Take 600 mg by mouth daily.   amLODipine 5 MG tablet Commonly known as: NORVASC Take 5 mg by mouth daily.   apixaban 5 MG Tabs tablet Commonly known as: ELIQUIS Take 1 tablet (5 mg total) by mouth 2 (two) times daily.   ascorbic acid 500 MG tablet Commonly known as: VITAMIN C Take 500 mg by mouth daily.   Coenzyme Q10 200 MG capsule Take 200 mg by mouth daily.   FISH OIL OMEGA-3 PO Take 2 capsules by mouth 2 (two) times daily.   folic acid 1 MG tablet Commonly known as: FOLVITE Take 1 mg by mouth daily.   GLUCOSAMINE 1500 COMPLEX PO Take 1,500 mg by mouth daily.   hydroxychloroquine 200 MG tablet Commonly known as: PLAQUENIL Take 1 tablet by  mouth 2 (two) times daily.   isosorbide mononitrate 60 MG 24 hr tablet Commonly known as: IMDUR TAKE 1 AND 1/2 TABLETS(90 MG) BY MOUTH DAILY What changed: See the new instructions.   losartan-hydrochlorothiazide 100-12.5 MG tablet Commonly known as: HYZAAR Take 1 tablet by mouth daily.   Magnesium 250 MG Tabs Take 250 mg by mouth daily.   metFORMIN 500 MG 24 hr tablet Commonly known as: GLUCOPHAGE-XR Take 1,000 mg by mouth 2 (two) times daily with a meal.   methotrexate 2.5 MG tablet Commonly known as: RHEUMATREX Take 2.5 mg by mouth as directed. Take 1 tablet for 5 days out of 7, weekly   mirabegron ER 25 MG Tb24 tablet Commonly known as: MYRBETRIQ Take 1 tablet (25 mg total) by mouth daily.   nitroGLYCERIN 0.4 MG SL tablet Commonly known as: NITROSTAT Place 1 tablet (0.4 mg total) under the tongue every 5 (five) minutes x 3 doses as needed for chest pain.   nystatin cream Commonly known as: MYCOSTATIN Apply 1 application. topically daily as needed for dry skin.   pantoprazole 40 MG tablet Commonly known as: PROTONIX Take 40 mg by mouth daily.   Potassium 99 MG Tabs Take 99 mg by mouth daily.   pyridOXINE 50 MG tablet Commonly known as: VITAMIN B6 Take 50 mg by mouth daily.   ranolazine 1000 MG SR tablet Commonly known as: RANEXA Take 1 tablet (1,000 mg total) by mouth 2 (two) times daily. TAKE 1 TABLET(1000 MG) BY MOUTH TWICE DAILY Strength: 1,000 mg   SILICA PO Take 15 mLs by mouth daily. Plant based collagen booster   tamsulosin 0.4 MG Caps capsule Commonly known as: FLOMAX Take 2 capsules (0.8 mg total) by mouth daily.   triamcinolone cream 0.1 % Commonly known as: KENALOG Apply 1 application. topically daily as needed (rash).   Trulicity 0.75 MG/0.5ML Soaj Generic drug: Dulaglutide Inject 0.75 mg into the skin once a week.   VITAMIN B-12 PO Take by mouth.        Allergies: No Known Allergies  Family History: Family History  Problem  Relation Age of Onset   Dementia Mother    Heart disease Father    Heart attack Father     Social History:  reports that he has quit smoking. His smoking use included cigarettes. He has never used smokeless tobacco. He reports current alcohol use of about 2.0 standard drinks of alcohol per week. He reports that he does not use drugs.  ROS: Pertinent ROS in HPI  Physical Exam: There were no vitals taken for this visit.  Constitutional:  Well nourished. Alert and oriented, No acute distress. HEENT: [place] AT, moist mucus membranes.  Trachea  midline, no masses. Cardiovascular: No clubbing, cyanosis, or edema. Respiratory: Normal respiratory effort, no increased work of breathing. GI: Abdomen is soft, non tender, non distended, no abdominal masses. Liver and spleen not palpable.  No hernias appreciated.  Stool sample for occult testing is not indicated.   GU: No CVA tenderness.  No bladder fullness or masses.  Patient with circumcised/uncircumcised phallus. ***Foreskin easily retracted***  Urethral meatus is patent.  No penile discharge. No penile lesions or rashes. Scrotum without lesions, cysts, rashes and/or edema.  Testicles are located scrotally bilaterally. No masses are appreciated in the testicles. Left and right epididymis are normal. Rectal: Patient with  normal sphincter tone. Anus and perineum without scarring or rashes. No rectal masses are appreciated. Prostate is approximately *** grams, *** nodules are appreciated. Seminal vesicles are normal. Skin: No rashes, bruises or suspicious lesions. Lymph: No cervical or inguinal adenopathy. Neurologic: Grossly intact, no focal deficits, moving all 4 extremities. Psychiatric: Normal mood and affect.  Laboratory Data: Component     Latest Ref Rng 10/28/2023  Prostate Specific Ag, Serum     0.0 - 4.0 ng/mL 3.7    Comprehensive Metabolic Panel (CMP) Order: [id number] Component Ref Range & Units 1 mo ago  Glucose 70 - 110 mg/dL 756 High    Sodium 433 - 145 mmol/L 139  Potassium 3.6 - 5.1 mmol/L 4.4  Chloride 97 - 109 mmol/L 105  Carbon Dioxide (CO2) 22.0 - 32.0 mmol/L 28.1  Urea Nitrogen (BUN) 7 - 25 mg/dL 23  Creatinine 0.7 - 1.3 mg/dL 1.1  Glomerular Filtration Rate (eGFR) >60 mL/min/1.73sq m 73  Comment: CKD-EPI (2021) does not include patient's race in the calculation of eGFR.  Monitoring changes of plasma creatinine and eGFR over time is useful for monitoring kidney function.  Interpretive Ranges for eGFR (CKD-EPI 2021):  eGFR:       >60 mL/min/1.73 sq. m - Normal eGFR:       30-59 mL/min/1.73 sq. m - Moderately Decreased eGFR:       15-29 mL/min/1.73 sq. m  - Severely Decreased eGFR:       < 15 mL/min/1.73 sq. m  - Kidney Failure   Note: These eGFR calculations do not apply in acute situations when eGFR is changing rapidly or patients on dialysis.  Calcium 8.7 - 10.3 mg/dL 9.1  AST 8 - 39 U/L 18  ALT 6 - 57 U/L 26  Alk Phos (alkaline Phosphatase) 34 - 104 U/L 44  Albumin 3.5 - 4.8 g/dL 4.3  Bilirubin, Total 0.3 - 1.2 mg/dL 0.5  Protein, Total 6.1 - 7.9 g/dL 6.1  A/G Ratio 1.0 - 5.0 gm/dL 2.4  Resulting Agency [facility]   Specimen Collected: 11/04/23 08:10   Performed by: [facility] Last Resulted: 11/04/23 11:59  Received From: [facility]  Result Received: 11/11/23 13:34   Hemoglobin A1C Order: [id number] Component Ref Range & Units 1 mo ago  Hemoglobin A1C 4.2 - 5.6 % 5.7 High   Average Blood Glucose (Calc) mg/dL 606  Resulting Agency [facility]  Narrative Performed by [facility] Normal Range:    4.2 - 5.6% Increased Risk:  5.7 - 6.4% Diabetes:        >= 6.5% Glycemic Control for adults with diabetes:  <7%    Specimen Collected: 11/04/23 08:10   Performed by: [facility] Last Resulted: 11/04/23 10:52  Received From: [facility]  Result Received: 11/11/23 13:34    Lipid Panel w/calc LDL Order: [id number] Component Ref Range & Units 1 mo ago  Cholesterol, Total 100 - 200 mg/dL 130  Triglyceride 35 - 199 mg/dL 865  HDL (High Density Lipoprotein) Cholesterol 29.0 - 71.0 mg/dL 78.4  LDL Calculated 0 - 130 mg/dL 696  VLDL Cholesterol mg/dL 26  Cholesterol/HDL Ratio 4.2  Resulting Agency [facility]  Narrative  This result has an attachment that is not available.   Specimen Collected: 11/04/23 08:10   Performed by: [facility] Last Resulted: 11/04/23 11:59  Received From: [facility]  Result Received: 11/11/23 13:34  I have reviewed the labs.   Pertinent Imaging: ***  Assessment & Plan:  ***  1. BPH with LUTS -PSA stable  -DRE benign -symptoms - frequency -continue conservative management, avoiding bladder irritants and timed voiding's -Continue tamsulosin 0.4 mg twice daily   2. Nocturia -deferred sleep study -managed with Myrbetriq 25 mg nightly   3. Erectile dysfunction -using alternative methods for intimacy   No follow-ups on file.  These notes generated with voice recognition software. I apologize for typographical errors.  Cloretta Ned  [facility] [address]  [address] [place], [place] [address] [phone]

## 2023-12-07 ENCOUNTER — Ambulatory Visit: Payer: Medicare Other | Admitting: Urology

## 2023-12-07 ENCOUNTER — Encounter: Payer: Self-pay | Admitting: Urology

## 2023-12-07 VITALS — BP 112/69 | HR 76 | Ht 71.0 in | Wt 196.0 lb

## 2023-12-07 DIAGNOSIS — N529 Male erectile dysfunction, unspecified: Secondary | ICD-10-CM

## 2023-12-07 DIAGNOSIS — R351 Nocturia: Secondary | ICD-10-CM

## 2023-12-07 DIAGNOSIS — N401 Enlarged prostate with lower urinary tract symptoms: Secondary | ICD-10-CM | POA: Diagnosis not present

## 2023-12-07 DIAGNOSIS — R972 Elevated prostate specific antigen [PSA]: Secondary | ICD-10-CM

## 2023-12-07 DIAGNOSIS — N138 Other obstructive and reflux uropathy: Secondary | ICD-10-CM

## 2023-12-07 LAB — BLADDER SCAN AMB NON-IMAGING: Scan Result: 59

## 2023-12-07 MED ORDER — TAMSULOSIN HCL 0.4 MG PO CAPS: 0.8000 mg | ORAL_CAPSULE | Freq: Every day | ORAL | 3 refills | Status: DC

## 2024-01-06 ENCOUNTER — Other Ambulatory Visit: Payer: Medicare Other

## 2024-02-16 ENCOUNTER — Ambulatory Visit (INDEPENDENT_AMBULATORY_CARE_PROVIDER_SITE_OTHER): Payer: Medicare Other

## 2024-02-16 DIAGNOSIS — I442 Atrioventricular block, complete: Secondary | ICD-10-CM

## 2024-02-17 LAB — CUP PACEART REMOTE DEVICE CHECK
Battery Voltage: 85
Date Time Interrogation Session: 20250226091349
Implantable Lead Connection Status: 753985
Implantable Lead Connection Status: 753985
Implantable Lead Implant Date: 20230528
Implantable Lead Implant Date: 20230528
Implantable Lead Location: 753859
Implantable Lead Location: 753860
Implantable Lead Model: 377169
Implantable Lead Model: 377171
Implantable Lead Serial Number: 8000368134
Implantable Lead Serial Number: 8000749513
Implantable Pulse Generator Implant Date: 20230528
Pulse Gen Model: 407145
Pulse Gen Serial Number: 70380012

## 2024-02-19 ENCOUNTER — Other Ambulatory Visit: Payer: Self-pay | Admitting: Cardiovascular Disease

## 2024-02-20 ENCOUNTER — Encounter: Payer: Self-pay | Admitting: Internal Medicine

## 2024-03-21 NOTE — Progress Notes (Signed)
 Remote pacemaker transmission.

## 2024-03-21 NOTE — Addendum Note (Signed)
 Addended by: Elease Etienne A on: 03/21/2024 04:07 PM   Modules accepted: Orders

## 2024-04-03 ENCOUNTER — Other Ambulatory Visit: Payer: Self-pay | Admitting: Cardiovascular Disease

## 2024-04-04 ENCOUNTER — Other Ambulatory Visit (HOSPITAL_COMMUNITY): Payer: Self-pay

## 2024-04-04 ENCOUNTER — Telehealth: Payer: Self-pay | Admitting: Cardiovascular Disease

## 2024-04-04 NOTE — Telephone Encounter (Signed)
 Pt c/o medication issue:  1. Name of Medication:   isosorbide mononitrate (IMDUR) 60 MG 24 hr tablet   2. How are you currently taking this medication (dosage and times per day)?   3. Are you having a reaction (difficulty breathing--STAT)?   4. What is your medication issue?   Caller Terence Fend) stated prescription from Dr. Loetta Ringer is not being accepted by patient's insurance.  Caller wants a call back to discuss next steps.

## 2024-04-05 MED ORDER — ISOSORBIDE MONONITRATE ER 60 MG PO TB24: 90.0000 mg | ORAL_TABLET | Freq: Every day | ORAL | 0 refills | Status: DC

## 2024-04-05 NOTE — Telephone Encounter (Signed)
 Patient identification verified by 2 forms. Patrick Lucky, RN    Called and spoke to patient  Informed patient:   -due to current issue with [facility], unable to process Dr. Loetta Ringer prescription   -would need to send prescription to a different pharmacy  Patient states:   -can [facility]   -he currently takes Isorsorbide 60 mg daily not 90 mg   -this change was made but the prescription was never updated  Informed patient Rx sent to pharmacy, RN will confirm dosing with Dr. Loetta Ringer  Patient scheduled for OV 6/19 at 8:20 AM with Dr. Loetta Ringer  Advised patient to follow up at OV as well  Patient verbalized understanding, no questions at this time

## 2024-04-09 NOTE — Telephone Encounter (Signed)
 When I last saw him he was taking isosorbide  60 mg in the morning and 30 mg at night

## 2024-04-11 MED ORDER — ISOSORBIDE MONONITRATE ER 60 MG PO TB24: 60.0000 mg | ORAL_TABLET | Freq: Every day | ORAL | 3 refills | Status: AC

## 2024-04-11 NOTE — Telephone Encounter (Signed)
 This RN spoke to Dr. Loetta Ringer in office Today  Dr. Loetta Ringer states okay for patient to continue taking Isosorbide  60 mg daily    Patient identification verified by 2 forms. Patrick Lucky, RN    Called and spoke to patient  Informed patient:   -per Dr. Loetta Ringer okay to continue taking 60 mg daily   -updated [facility]   -at this time [facility] situation has not resolved   -does not need to pick up if he has enough Rx at home   -keep 6/19 OV for follow up  Patient verbalized understanding, no questions at this time

## 2024-05-17 ENCOUNTER — Ambulatory Visit (INDEPENDENT_AMBULATORY_CARE_PROVIDER_SITE_OTHER): Payer: Medicare Other

## 2024-05-17 DIAGNOSIS — I442 Atrioventricular block, complete: Secondary | ICD-10-CM

## 2024-05-18 LAB — CUP PACEART REMOTE DEVICE CHECK
Date Time Interrogation Session: 20250528080902
Implantable Lead Connection Status: 753985
Implantable Lead Connection Status: 753985
Implantable Lead Implant Date: 20230528
Implantable Lead Implant Date: 20230528
Implantable Lead Location: 753859
Implantable Lead Location: 753860
Implantable Lead Model: 377169
Implantable Lead Model: 377171
Implantable Lead Serial Number: 8000368134
Implantable Lead Serial Number: 8000749513
Implantable Pulse Generator Implant Date: 20230528
Pulse Gen Model: 407145
Pulse Gen Serial Number: 70380012

## 2024-05-19 ENCOUNTER — Ambulatory Visit: Payer: Self-pay | Admitting: Internal Medicine

## 2024-06-08 ENCOUNTER — Encounter: Payer: Self-pay | Admitting: Cardiovascular Disease

## 2024-06-08 ENCOUNTER — Ambulatory Visit: Attending: Cardiovascular Disease | Admitting: Cardiovascular Disease

## 2024-06-08 DIAGNOSIS — M069 Rheumatoid arthritis, unspecified: Secondary | ICD-10-CM | POA: Insufficient documentation

## 2024-06-08 DIAGNOSIS — I2510 Atherosclerotic heart disease of native coronary artery without angina pectoris: Secondary | ICD-10-CM

## 2024-06-08 DIAGNOSIS — R079 Chest pain, unspecified: Secondary | ICD-10-CM | POA: Diagnosis present

## 2024-06-08 DIAGNOSIS — I442 Atrioventricular block, complete: Secondary | ICD-10-CM

## 2024-06-08 DIAGNOSIS — E118 Type 2 diabetes mellitus with unspecified complications: Secondary | ICD-10-CM | POA: Insufficient documentation

## 2024-06-08 DIAGNOSIS — I10 Essential (primary) hypertension: Secondary | ICD-10-CM | POA: Diagnosis present

## 2024-06-08 DIAGNOSIS — I2102 ST elevation (STEMI) myocardial infarction involving left anterior descending coronary artery: Secondary | ICD-10-CM | POA: Diagnosis present

## 2024-06-08 DIAGNOSIS — G4733 Obstructive sleep apnea (adult) (pediatric): Secondary | ICD-10-CM | POA: Diagnosis present

## 2024-06-08 DIAGNOSIS — Z950 Presence of cardiac pacemaker: Secondary | ICD-10-CM | POA: Insufficient documentation

## 2024-06-08 NOTE — Progress Notes (Signed)
 Cardiology Office Note    Date:  06/10/2024   ID:  Patrick [name], DOB 1955/12/29, MRN [id number]  PCP:  Patrick Layman ORN, MD  Cardiologist:  Patrick Sor, MD   53-month follow-up cardiology evaluation  History of Present Illness:  Patrick [name] is a 68 y.o. male who suffered an anterior ST segment elevation myocardial infarction on 08/04/2017. I last saw him in September 2024.  He presents for 53-month follow-up cardiology evaluation.  Patrick [name] has a history of hypertension, hyperlipidemia, and diabetes mellitus.  He was admitted on 08/04/2017 with ST segment elevation anterolaterally and a code STEMI was activated.  I performed emergent cardiac catheterization which revealed total occlusion of his LAD after the first diagonal vessel.  There was a long segment of occlusion and ultimately a Resolute on a 3.038 mm stent was inserted, postdilated 3.25 mm with 100% occlusion being reduced to 0% and TIMI 0 flow been improved to TIMI 3 flow.  He developed transient heart block during the procedure which required IV atropine .  Of note, his lipid panel during that evaluation was consistent with an atherogenic dyslipidemic pattern with triglycerides of 370, VLDL 74, low HDL at 29, and his total cholesterol was 204 with LDL 101.  He was started on atorvastatin  and also was on fenofibrate.  Subsequent, fenofibrate was discontinued by his primary physician and he has been taking over-the-counter fish oil.  Repeat blood work 1 month later by his primary physician showed a total cholesterol 124, LDL 47, triglycerides were 251, HDL was 26.5.  Over the past several months, he has done well.  He denies any definitive recurrent anginal symptomatology.  He had experienced 1 vague episode of  similar sensation after working 16 hours a day for 3 days and having to go to another business trip in [place] .    When I saw him he had noticed some occasional episodes of heartburn sensation, but also has noted this  with mild exertion.  He was seen by Patrick Satterfield, NP on 02/28/2018.  Because of worsening heartburn symptoms and fatigue, which seemed nitrate responsive she recommended a follow-up nuclear perfusion study for reassessment.  His nuclear study  on 03/10/2018 was interpreted as a high risk study suggested ischemia involving both the inferior as well as anterior wall.  There was diffuse hypokinesis.  EF was 45%.  When I saw him in follow-up of the nuclear study I recommended definitive cardiac catheterization.  Cardiac catheterization was done on March 21, 2018. This revealed preserved global LV contractility with a small region of very mild residual mid anterolateral hypocontractility.  Ejection fraction was 50-55%.  The stent in the mid LAD was widely patent and his circumflex and dominant RCA were normal.  Medical therapy was recommended and optimization of blood pressure.  Due to lipid studies that were significantly elevated in August 2018 I recommended the addition of his Vascepa  to his atorvastatin .   I saw him in May 2019 and last saw him in October 2019.  At that time he continued to do well.  At the time, he was on amlodipine  10 mg and metoprolol  25 mg twice a day.  His resting pulse was 96 and I recommended titration of metoprolol  tartrate to 37.5 mg twice a day.  Laboratory on September 19, 2018 at the [place] [facility]: Total cholesterol was 101, HDL 33, LDL 50, triglycerides had improved to 88.  VLDL was 18.  He is enrolled in a study with  Dr. Lenon which is an oral Victoza therapy.  Hemoglobin A1c before the study was 8.3 and 1 week ago repeat hemoglobin A1c was 6.8.     I evaluated him in a telemedicine visit on October 11, 2019.  At that time he had told me that 3 weeks previously he went to the beach and while walking on the beach for 10 to 15 minutes began to notice a substernal chest discomfort.  He was able to walk through it but at a slower pace.  Since that time, he has experienced some  recurrent exertional chest tightness particularly if walking fast up hills.  He has not tried nitroglycerin .  He denies any rest discomfort.  He denies any palpitations.  He denies PND orthopnea.  He had recently seen Dr. Lenon.  He tells me during his recent evaluation his blood pressure was 131/71 and his pulse was 68.  Laboratory revealed a total cholesterol 109, triglycerides 218, VLDL 44, HDL 29.9, and LDL 36.    During theevaluation, I recommended slight titration of meds Toprol  50 mg twice a day, continue amlodipine  10 mg daily and initiated isosorbide  30 mg.  I scheduled him to undergo a nuclear perfusion study for reevaluation and further assessment of his chest pain and potential for ischemia.  When I saw him in November 2020 he admitted to some mild recurrent chest pain episodes.  He had an episode when he was lifting a refrigerator outside and noticed discomfort.  He underwent a nuclear perfusion study on October 19, 2019.  There was a suggestion of a medium defect of mild severity in the basal inferior to mid inferior wall suggestive of possible ischemia.  He did not have any ECG changes during stress and he had normal wall motion and EF at 59% on quantitative analysis.  His chest pain has some atypical features.  He wasa been taking pantoprazole  for GERD.  During that evaluation, I recommended increasing isosorbide  to 60 mg daily and also suggested a trial of increasing Protonix  to twice a day for the next several weeks to see if there was any improvement.  I saw him in April 2021 and over the prior 2 months he denied any major change in symptomatology.  Oftentimes he may noticed the development of some mild chest discomfort approximately 15 minutes into a walk.  He typically does not walk much longer than that so was uncertain if he was able to walk through the pain.  He denied any severe chest pain or significant dyspnea.  There was one occurrence where he had noticed a vague chest sensation  at night.  He has not been successful with weight loss.    Since his prior evaluation, he presented to [facility] ER on March 29, 2021 with dizziness.  His vital signs were normal and he had extensive imaging and was sent home.  Apparently he had several other occurrences of presyncope and had a syncopal spell on July 14, 2021 and was found to be bradycardic and hypotensive.  He presented to [facility] ER where ECG revealed complete heart block.  He underwent insertion of a temporary pacemaker.  His beta-blocker was held and conduction recovered.  He was seen by Dr. Inocencio who opted to defer permanent pacemaker implant.  When I saw him in follow-up on July 25, 2021 he felt well and was no longer on rate control medication.  He denied any anginal symptomatology or shortness of breath.  He is still working in [employer].  His  blood pressure at home has been elevated, and he was started on losartan  HCT 100/12.5 mg in place of losartan  100 mg.  He continued to be on amlodipine  10 mg, isosorbide  90 mg, continues to be on aspirin  81 mg and reduced dose of Brilinta  60 mg twice a day.  He continues to be on Ranexa  1000 mg twice a day.  He was seen by his primary physician Dr. Layman Piety today prior to my evaluation.  During that evaluation he remained stable and had not had any episodes of chest discomfort or dizziness.  He was experiencing easy bruisability on low-dose aspirin  and Brilinta  60 mg twice a day and I suggested switching to generic clopidogrel  75 mg daily once he finishes current Brilinta  pills.  He was evaluated by Aline Door in September 2022 after he had experienced another episode of dizziness on August 19, 2021.  EKG showed sinus rhythm with rates in the 80s with chronic right bundle branch block but no high-grade AV block.  At the time his blood pressure was well controlled.  I saw him on November 18, 2021 at which time he continued to feel well and denied any dizziness over the past  month.. He had been evaluated by neurologist and was given a prescription for meclizine  to take as needed.  He denied any recurrent chest pain and is able to walk without discomfort.  He has follow-up scheduled with neurology.  He also had worn a 3-day monitor by Dr. Layman Piety which apparently did not reveal any major findings.    He developed an episode of heart block and received a notice on his phone on May 15, 2022 with heart rates in the 30s.  He was symptomatic with dizziness.  He presented to the emergency room and was given atropine .  ECG showed 2-1 AV block with heart rate of 51 chronic right bundle branch block.  Creatinine was 1.73.  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 Cathlyn Birmingham on May 17, 2022.  Patrick [name] was subsequently evaluated on 02/26/2022 by Josefa Beauvais, NP.  At that time he felt improved he had experienced a short episode of SVT today after his pacemaker insertion.  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  I saw him on September 17, 2022.  He felt well  but admitted to some decreased energy.  He has had issues with a kidney stone.  He was to undergo surgery and had begun to hold Plavix  but he passed the kidney stone on September 25 spontaneously and surgery was aborted.  He is now back on Plavix .  He feels improved since insertion of his pacemaker.  He denies any anginal symptoms.  He was evaluated by Josefa Beauvais on September 28, 2022.  [place] was [place] the [place] the weekend prior to that evaluation and noticed some mild chest discomfort which dissipated quickly within 30 seconds with rest.  He was given a prescription for sublingual nitroglycerin ..  I saw him on February 02, 2023. Patrick [name] has felt well.  He tells me he was recently started on CPAP  therapy followed by Dr. Piety.  He has noticed improvement in his sleep pattern.  He had undergone laboratory in November 2023 and total cholesterol was 91 LDL 30 triglycerides 125 with creatinine 1.1 and glucose 143.  He denied any recent chest pain.  He is followed by Dr. Layman Piety in [place].    I last saw him on September 09, 2019 for and since his prior evaluation he had undergone remote pacemaker checks by Dr. Cathlyn Birmingham.  He also has had significant weight loss with weight from 222 pounds down to approximately 195 pounds.  He initially had lost 15 pounds by June and then more recently has lost additional weight.  He is now on a anti-inflammatory diet since June.  He tells me he was told of having both rheumatoid as well as osteomyelitis.  At times he notes vague chest pain.  He had reduced his ranolazine  dose down from 100 mg 12 twice a day to just daily.  He is now on Plaquenil and methotrexate for his rheumatoid arthritis.  He continues to be on DAPT with aspirin /Plavix .  He is on amlodipine  5 mg in addition to isosorbide  mononitrate 60 mg in the morning and 30 mg at night.  Previously LDL had been reduced to 33 with statin therapy.  He had stopped taking rosuvastatin  20 mg since August.  He had noticed some vague chest sensation which had not increased but with his reduction of Ranexa  dose I suggested he resume prior dose at 1000 mg twice a day.  He continued to be followed by Dr. Piety for primary care.  He is diabetic on Trulicity.    Since I last saw him, he retired this year.  When last seen by Dr. Birmingham on October 04, 2023, due to a several hour episode of episode of PAF it was recommended that he discontinue DAPT and initiate Eliquis .  He is unaware of any recurrent AF.  Recently, he has noticed when he walks a modest incline he experiences a sensation of chest pressure.  He is uncertain if this is related to dyspepsia from acid reflux or potential ischemia.  He recently  joined a gym and has been exercising.  He has not seen Dr. Birmingham and has undergone every 61-month remote pacemaker checks.  He is on amlodipine  5 mg daily, isosorbide  60 mg, losartan  HCT 100/12.5 mg, and ranolazine  1000 mg daily.  He is back on rosuvastatin  5 mg daily.  He is anticoagulated on Eliquis  5 mg twice a day.  He is diabetic on metformin in addition to Trulicity.  He continues to be on Plaquenil in addition to methotrexate with his rheumatoid arthritis.  He had blood work 1 month ago at the [place] clinic and glucose was 129.  Creatinine 1.2.  LFTs normal.  Hemoglobin A1c was 5.7.  Total cholesterol was 98 triglycerides 157 LDL 26.  He presents for evaluation.   Past Medical History:  Diagnosis Date   CAD in native artery    a. anterolateral STEMI 07/2017 s/p DES to prox-mid LAD, EF 55%.   GERD (gastroesophageal reflux disease)    History of kidney stones    Hyperlipidemia    Hypertension    Myocardial infarction [facility])    Overactive bladder    Presence of permanent cardiac pacemaker    Renal calculi    Rheumatoid arthritis (HCC)    Syncope    Uncontrolled diabetes mellitus     Past Surgical History:  Procedure Laterality Date   CORONARY/GRAFT ACUTE MI REVASCULARIZATION N/A 08/04/2017   Procedure: Coronary/Graft Acute MI Revascularization;  Surgeon: Burnard Patrick LABOR, MD;  Location: [facility] INVASIVE CV LAB;  Service: Cardiovascular;  Laterality: N/A;   LEFT HEART CATH AND CORONARY ANGIOGRAPHY  N/A 08/04/2017   Procedure: LEFT HEART CATH AND CORONARY ANGIOGRAPHY;  Surgeon: Burnard Patrick LABOR, MD;  Location: [facility] INVASIVE CV LAB;  Service: Cardiovascular;  Laterality: N/A;   LEFT HEART CATH AND CORONARY ANGIOGRAPHY N/A 03/21/2018   Procedure: LEFT HEART CATH AND CORONARY ANGIOGRAPHY;  Surgeon: Burnard Patrick LABOR, MD;  Location: [facility] INVASIVE CV LAB;  Service: Cardiovascular;  Laterality: N/A;   PACEMAKER IMPLANT N/A 05/17/2022   Procedure: PACEMAKER IMPLANT;  Surgeon: [name] Danelle ORN, MD;   Location: [facility] INVASIVE CV LAB;  Service: Cardiovascular;  Laterality: N/A;   PROSTATE SURGERY     in office Dr Kassie   TEMPORARY PACEMAKER N/A 07/14/2021   Procedure: TEMPORARY PACEMAKER;  Surgeon: Anner Alm ORN, MD;  [facility];  Service: Cardiovascular;  Laterality: N/A;   TONSILLECTOMY      Current Medications: Outpatient Medications Prior to Visit  Medication Sig Dispense Refill   Acetylcarnitine HCl (ACETYL L-CARNITINE PO) Take 1,500 mg by mouth daily.     ALPHA LIPOIC ACID PO Take 600 mg by mouth daily.     amLODipine  (NORVASC ) 5 MG tablet Take 5 mg by mouth daily.     apixaban  (ELIQUIS ) 5 MG TABS tablet Take 1 tablet (5 mg total) by mouth 2 (two) times daily. 180 tablet 3   Coenzyme Q10 200 MG capsule Take 200 mg by mouth daily.     Cyanocobalamin (VITAMIN B-12 PO) Take by mouth.     folic acid (FOLVITE) 1 MG tablet Take 1 mg by mouth daily.     Glucosamine-Chondroit-Vit C-Mn (GLUCOSAMINE 1500 COMPLEX PO) Take 1,500 mg by mouth daily.     hydroxychloroquine (PLAQUENIL) 200 MG tablet Take 1 tablet by mouth 2 (two) times daily.     isosorbide  mononitrate (IMDUR ) 60 MG 24 hr tablet Take 1 tablet (60 mg total) by mouth daily. 90 tablet 3   losartan -hydrochlorothiazide (HYZAAR) 100-12.5 MG tablet Take 1 tablet by mouth daily.     Magnesium 250 MG TABS Take 250 mg by mouth daily.     metFORMIN (GLUCOPHAGE-XR) 500 MG 24 hr tablet Take 1,000 mg by mouth 2 (two) times daily with a meal.     methotrexate (RHEUMATREX) 2.5 MG tablet Take 2.5 mg by mouth as directed. Take 1 tablet for 5 days out of 7, weekly     Multiple Vitamins-Minerals (ABC PLUS SENIOR PO) Take 1 tablet by mouth daily.     nitroGLYCERIN  (NITROSTAT ) 0.4 MG SL tablet Place 1 tablet (0.4 mg total) under the tongue every 5 (five) minutes x 3 doses as needed for chest pain. 25 tablet 0   Nutritional Supplements (SILICA PO) Take 15 mLs by mouth daily. Plant based collagen booster     nystatin cream (MYCOSTATIN)  Apply 1 application. topically daily as needed for dry skin.     Omega-3 Fatty Acids (FISH OIL OMEGA-3 PO) Take 2 capsules by mouth 2 (two) times daily.      pantoprazole  (PROTONIX ) 40 MG tablet Take 40 mg by mouth daily.     Potassium 99 MG TABS Take 99 mg by mouth daily.     pyridOXINE (VITAMIN B-6) 50 MG tablet Take 50 mg by mouth daily.     ranolazine  (RANEXA ) 1000 MG SR tablet TAKE 1 TABLET(1000 MG) BY MOUTH TWICE DAILY 180 tablet 3   rosuvastatin  (CRESTOR ) 5 MG tablet Take 5 mg by mouth daily.     tamsulosin  (FLOMAX ) 0.4 MG CAPS capsule Take 2 capsules (0.8 mg total) by mouth  daily. 180 capsule 3   triamcinolone cream (KENALOG) 0.1 % Apply 1 application. topically daily as needed (rash).     TRULICITY 0.75 MG/0.5ML SOPN Inject 0.75 mg into the skin once a week.     vitamin C (ASCORBIC ACID) 500 MG tablet Take 500 mg by mouth daily.     mirabegron  ER (MYRBETRIQ ) 25 MG TB24 tablet Take 1 tablet (25 mg total) by mouth daily. 90 tablet 3   No facility-administered medications prior to visit.     Allergies:   Patient has no known allergies.   Social History   Socioeconomic History   Marital status: Married    Spouse name: Not on file   Number of children: 3   Years of education: GED   Highest education level: Not on file  Occupational History   Occupation: Editor, commissioning  Tobacco Use   Smoking status: Former    Types: Cigarettes   Smokeless tobacco: Never   Tobacco comments:    Quit smoking at age 58.  Vaping Use   Vaping status: Never Used  Substance and Sexual Activity   Alcohol use: Yes    Alcohol/week: 2.0 standard drinks of alcohol    Types: 2 Shots of liquor per week   Drug use: No   Sexual activity: Not on file  Other Topics Concern   Not on file  Social History Narrative   Lives at home with his wife.   Right-handed.   Caffeine use: 3.5 cups per day.   Social Drivers of Corporate investment banker Strain: Low Risk  (01/19/2024)   Received from [facility]   Overall Financial Resource Strain (CARDIA)    Difficulty of Paying Living Expenses: Not hard at all  Food Insecurity: No Food Insecurity (01/19/2024)   Received from [facility]   Hunger Vital Sign    Within the past 12 months, you worried that your food would run out before you got the money to buy more.: Never true    Within the past 12 months, the food you bought just didn't last and you didn't have money to get more.: Never true  Transportation Needs: No Transportation Needs (01/19/2024)   Received from [facility] - Transportation    In the past 12 months, has lack of transportation kept you from medical appointments or from getting medications?: No    Lack of Transportation (Non-Medical): No  Physical Activity: Not on file  Stress: Not on file  Social Connections: Not on file     Family History:  The patient's family history includes Dementia in his mother; Heart attack in his father; Heart disease in his father.   ROS General: Negative; No fevers, chills, or night sweats;  HEENT: Negative; No changes in vision or hearing, sinus congestion, difficulty swallowing Pulmonary: Positive for bronchitis Cardiovascular:   See history of present illness GI: Negative; No nausea, vomiting, diarrhea, or abdominal pain GU: Negative; No dysuria, hematuria, or difficulty voiding Musculoskeletal: Negative; no myalgias, joint pain, or weakness Hematologic/Oncology: Negative; no easy bruising, bleeding Rheumatologic: Rheumatoid arthritis Endocrine: Positive for diabetes Neuro: Negative; no changes in balance, headaches Skin: Negative; No rashes or skin lesions Psychiatric: Negative; No behavioral problems, depression Sleep: Recently diagnosed with OSA with initiation of CPAP therapy   Other comprehensive 14 point system review is negative.   PHYSICAL EXAM:   BP 124/76   Pulse 78   Ht 5' 11 (1.803 m)   [employer]  215 lb  (97.5 kg)   SpO2 98%   BMI 29.99 kg/m    Repeat blood pressure by me was 138/70  Wt Readings from Last 3 Encounters:  06/08/24 215 lb (97.5 kg)  12/07/23 196 lb (88.9 kg)  10/04/23 204 lb (92.5 kg)   General: Alert, oriented, no distress.  Skin: normal turgor, no rashes, warm and dry HEENT: Normocephalic, atraumatic. Pupils equal round and reactive to light; sclera anicteric; extraocular muscles intact;  Nose without nasal septal hypertrophy Mouth/Parynx benign; Mallinpatti scale 3 Neck: No JVD, no carotid bruits; normal carotid upstroke Lungs: clear to ausculatation and percussion; no wheezing or rales Chest wall: without tenderness to palpitation Heart: PMI not displaced, RRR, s1 s2 normal, 1/6 systolic murmur, no diastolic murmur, no rubs, gallops, thrills, or heaves Abdomen: soft, nontender; no hepatosplenomehaly, BS+; abdominal aorta nontender and not dilated by palpation. Back: no CVA tenderness Pulses 2+ Musculoskeletal: full range of motion, normal strength, no joint deformities Extremities: no clubbing cyanosis or edema, Homan's sign negative  Neurologic: grossly nonfocal; Cranial nerves grossly wnl Psychologic: Normal mood and affect    Studies/Labs Reviewed:   EKG Interpretation Date/Time:  Thursday June 08 2024 08:25:45 EDT Ventricular Rate:  78 PR Interval:  236 QRS Duration:  156 QT Interval:  438 QTC Calculation: 499 R Axis:   40  Text Interpretation: Atrial-paced rhythm with prolonged AV conduction Right bundle branch block When compared with ECG of 09-Sep-2023 16:27, No significant change was found Confirmed by Burnard Ned ([id number]) on 06/10/2024 12:45:37 PM    September 09, 2023 ECG (independently read by me): Atrially paced with prolonged AV conduction, right bundle branch block  February 02, 2023 ECG (independently read by me): AV paced with prolonged AV conduction at 344 msec  September 17, 2022 ECG (independently read by me): NSR at 68, with  intermittent Atrial pacing, RBBB  November 18, 2021 ECG (independently read by me):  NSR at 79, RBBB with repolarization  July 25, 2021: NSR at 85, RBBB, PR 192 msec  July 14, 2021: Complete heart block with RBBB, LPHB, ventricular rate 18 April 2020 ECG in ER (independently read by me): Normal sinus rhythm at 61 bpm.  Right bundle branch block with repolarization changes.  Normal intervals.  No ectopy.  November 07, 2019 EKG:  EKG is ordered today.  Normal sinus rhythm at 67 bpm.  Right bundle branch block with repolarization changes  October 17 2018 ECG (independently read by me): Normal sinus rhythm at 93 bpm.  Right bundle branch block with repolarization changes.  QTc interval 44 ms.  Apr 25, 2018 ECG (independently read by me): Normal sinus rhythm at 73 bpm.  Right bundle branch block with repolarization changes.  March 16, 2018 ECG (independently read by me): NSR at 63;RBBB with repolarization changes QTc 431 msec  November 2018 ECG (independently read by me): Normal sinus rhythm at 66 bpm.  Right bundle branch block with repolarization changes.  QTc interval 448 ms.  Recent Labs:    Latest Ref Rng & Units 05/16/2022    3:24 AM 05/15/2022    4:01 PM 09/01/2021    4:28 PM  BMP  Glucose 70 - 99 mg/dL 895  847  830   BUN 8 - 23 mg/dL 20  27  23    Creatinine 0.61 - 1.24 mg/dL 8.69  8.26  8.40   BUN/Creat Ratio 10 - 24   14   Sodium 135 - 145 mmol/L 137  137  137  Potassium 3.5 - 5.1 mmol/L 3.6  4.7  4.2   Chloride 98 - 111 mmol/L 105  104  99   CO2 22 - 32 mmol/L 26  22  22    Calcium  8.9 - 10.3 mg/dL 8.8  8.7  9.5         Latest Ref Rng & Units 05/15/2022    4:01 PM 07/14/2021   11:59 AM 07/14/2021   11:08 AM  Hepatic Function  Total Protein 6.5 - 8.1 g/dL 6.3  5.2  5.3   Albumin 3.5 - 5.0 g/dL 3.9  3.0  3.1   AST 15 - 41 U/L 40  18  21   ALT 0 - 44 U/L 40  22  21   Alk Phosphatase 38 - 126 U/L 35  29  31   Total Bilirubin 0.3 - 1.2 mg/dL 1.0  1.1  0.9         Latest Ref Rng & Units 05/16/2022    3:24 AM 05/15/2022    4:01 PM 09/01/2021    4:28 PM  CBC  WBC 4.0 - 10.5 K/uL 6.3  7.5  6.3   Hemoglobin 13.0 - 17.0 g/dL 87.0  86.0  86.5   Hematocrit 39.0 - 52.0 % 35.5  39.1  39.2   Platelets 150 - 400 K/uL 169  181  228    Lab Results  Component Value Date   MCV 88.5 05/16/2022   MCV 90.1 05/15/2022   MCV 91 09/01/2021   Lab Results  Component Value Date   TSH 0.732 05/15/2022   Lab Results  Component Value Date   HGBA1C 5.9 (H) 07/14/2021     BNP No results found for: BNP  ProBNP No results found for: PROBNP   Lipid Panel     Component Value Date/Time   CHOL 79 07/14/2021 1159   TRIG 89 07/14/2021 1159   HDL 27 (L) 07/14/2021 1159   CHOLHDL 2.9 07/14/2021 1159   VLDL 18 07/14/2021 1159   LDLCALC 34 07/14/2021 1159     RADIOLOGY: CUP PACEART REMOTE DEVICE CHECK Result Date: 05/18/2024 PPM Scheduled remote reviewed. Normal device function.  Presenting rhythm: AS/AP VS Next remote 91 days. [place], CV[facility]    Additional studies/ records that were reviewed today include:   Emergent cardiac catheterization/PCI 08/04/2017: Conclusion     LV end diastolic pressure is mildly elevated. The left ventricular systolic function is normal. A STENT RESOLUTE [id number] drug eluting stent was successfully placed. Prox LAD to Mid LAD lesion, 100 %stenosed. Post intervention, there is a 0% residual stenosis.   Acute ST segment elevation myocardial infarction secondary to total occlusion of the LAD immediately after the takeoff of the first diagonal and septal perforating artery.  There was TIMI 0 flow.   Mild luminal irregularity of the left circumflex vessel without significant obstructive stenoses.  Dominant normal RCA.   Successful PCI to the totally occluded LAD which resulted in a long segment of occlusion and ultimately treated with PTCA and DES stenting with a Resolute onyx 3.038 mm stent postdilated to 3.25 mm with 100%  occlusion being reduced to 0% and TIMI 0 flow being improved to TIMI 3 flow.   Transient heart block treated with IV atropine .   Preserved global LV contractility with subtle apical inferior focal hypocontractility.   The patient presented to [facility] emergency room at ~ 8:45 AM.  He presented to [facility] catheterization lab at approximately 10:45 AM.  During balloon time from presentation to [facility]  catheterization laboratory was ~ 20 minutes.   RECOMMENDATION: The patient will continue with dual antiplatelet therapy for minimum of a year.  He will be started on high potency statin therapy, low-dose beta blocker therapy, with ultimate ACE inhibition.  Metformin will be held for 48 hours postcontrast.     03/10/2018 Nuclear Study Highlights     The left ventricular ejection fraction is mildly decreased (45-54%). Nuclear stress EF: 45%. There was no ST segment deviation noted during stress. Findings consistent with ischemia. This is a high risk study.   TID 1.3 Normalization artifact likely but appears to be both inferior and anterior ischemia Diffuse hypokinesis EF 45%      March 21, 2018 cardiac catheterization conclusion     Previously placed Prox LAD to Mid LAD stent (unknown type) is widely patent. The left ventricular systolic function is normal. LV end diastolic pressure is normal. The left ventricular ejection fraction is 50-55% by visual estimate. There is no mitral valve regurgitation.   Preserved global LV contractility with a small region of very mild residual mid anterolateral hypocontractility.  Ejection fraction 50-55%.   No significant coronary obstructive disease with evidence for widely patent mid LAD stent after the takeoff of a first diagonal vessel, normal left circumflex and normal dominant RCA.   RECOMMENDATION: Medical therapy.  Continue DAPT in this patient status post anterior ST segment MI August 2018.  Optimize blood pressure.  Continue high potency  statin therapy.      NUCLEAR STUDY 10/19/2019 The left ventricular ejection fraction is normal (55-65%). Nuclear stress EF: 59%. There was no ST segment deviation noted during stress. Defect 1: There is a medium defect of mild severity present in the basal inferior and mid inferior location. Findings consistent with ischemia. This is a low risk study.   Abnormal, low risk stress nuclear study with mild inferior ischemia.  Gated ejection fraction 59% with normal wall motion.   ASSESSMENT:    1. ST elevation myocardial infarction involving left anterior descending (LAD) coronary artery [facility]): August 04, 2017   2. Essential hypertension   3. Complete heart block (HCC)   4. Pacemaker   5. Chest pain of uncertain etiology   6. CAD in native artery   7. Rheumatoid arthritis, involving unspecified site, unspecified whether rheumatoid factor present (HCC)   8. Type 2 diabetes mellitus with complication, without long-term current use of insulin  (HCC)   9. OSA (obstructive sleep apnea)     PLAN:  Patrick [name] is a 68 year old gentleman who has a history of hypertension, hyperlipidemia, and diabetes mellitus. On 08/04/2017 he presented with an acute anterior wall ST segment elevation myocardial infarctions and was found to have total occlusion of his LAD after the first diagonal vessel.  He was found to have a long occluded segment which was successfully treated with DES stenting with a 3.038 mm Resolute DES stent.  A nuclear perfusion study in March 2019 suggested the possibility of ischemia involving both the anterior and inferior wall.  EF was 45% and there was diffuse hypokinesis.  With his blood pressure elevation, and nuclear study suggestive of ischemia, I added amlodipine  5 mg to his regimen and further titrated metoprolol  to 37.5 mg twice a day.  He underwent definitive repeat cardiac catheterization on 03/21/2018 which revealed a widely patent mid LAD stent and a normal left circumflex  and dominant RCA.  He subsequently experienced recurrent chest pain symptomatology with plus minus atypical features.  A subsequent nuclear perfusion study  was low risk and raised concern for possible mild inferior ischemia.  I suspect this most likely is diaphragmatic attenuation.  At his last catheterization his RCA and circumflex vessels were entirely normal.  He had normal perfusion in the LAD territory and at last cath his LAD stent was widely patent.  Remotely, he had developed complete heart block with marked bradycardia requiring initial temporary pacemaker but heart rhythm stabilized following discontinuance of beta-blocker therapy in April 2022.  He has subsequently developed recurrent complete heart block with symptomatic bradycardia and  underwent permanent pacemaker implantation with a Biotronik dual chamber pacemaker implanted on May 17, 2022 by Dr. Cathlyn Birmingham.  He undergoes quarterly pacemaker checks remotely which have been stable.  He has lasted over 30 pounds.  He continues to be followed at the [facility] clinic by Dr. Layman Piety.   He was diagnosed with both rheumatoid and osteoarthritis and is now on Plaquenil in addition to methotrexate.  He has continued to be on DAPT with his CAD.  He apparently now is on a anti-inflammatory diet and avoids gluten, sugars, and has been eating mainly fruits and vegetables.  Previously, his LDL level had been reduced to 33 with statin therapy and apparently he stopped taking rosuvastatin .  Apparently since I last saw him in September 2024, he had a several hour episode of PAF and when last seen by Dr. Birmingham on October 04, 2023 he was recommended that he discontinue aspirin  and Plavix  and initiate Eliquis .  He is unaware of any recurrent AF.  He continues to have every 11-month defibrillator checks and remains atrially paced.  When I last saw him, he was experiencing some vague chest sensation and I recommended further titration of ranolazine  back to  1000 mg.  Presently, he states if he walks a modest incline he has experienced some chest pressure and he is uncertain if this is heart or possibly related to dyspepsia or esophageal spasm.  Usually the symptoms resolve spontaneously.  I am giving him a prescription for sublingual nitroglycerin .  I am also recommended he undergo a nuclear medicine PET stress cardiac perfusion study.  I reviewed most recent laboratory now that he is back on rosuvastatin .  LDL is excellent at 26.  Blood pressure today is relatively stable on amlodipine  5 mg, losartan  HCT 100/25 mg, isosorbide  60 mg daily in addition to his ranolazine .  He is diabetic on Trulicity in addition to metformin.  He continues to be on Plaquenil and methotrexate for his rheumatoid arthritis.  He has GERD and is on pantoprazole  40 mg daily.  He is aware of my imminent retirement.  I will transition him to the care of Dr. Francyne and we will arrange for follow-up appointment in 3 to 4 months following his nuclear medicine PET stress study.  He continues to be on CPAP therapy followed by Dr. Piety with reported excellent compliance.  It has been a pleasure to take care of him for these many years.   tedication Adjustments/Labs and Tests Ordered: Current medicines are reviewed at length with the patient today.  Concerns regarding medicines are outlined above.  Medication changes, Labs and Tests ordered today are listed in the Patient Instructions below. Patient Instructions  Medication Instructions:  Your physician recommends that you continue on your current medications as directed. Please refer to the Current Medication list given to you today.  *If you need a refill on your cardiac medications before your next appointment, please call your pharmacy*   Testing/Procedures: See  below  Follow-Up: At [facility], you and your health needs are our priority.  As part of our continuing mission to provide you with exceptional heart care,  our providers are all part of one team.  This team includes your primary Cardiologist (physician) and Advanced Practice Providers or APPs (Physician Assistants and Nurse Practitioners) who all work together to provide you with the care you need, when you need it.  Your next appointment:   3 month(s)  Provider:   Dr. Francyne  We recommend signing up for the patient portal called MyChart.  Sign up information is provided on this After Visit Summary.  MyChart is used to connect with patients for Virtual Visits (Telemedicine).  Patients are able to view lab/test results, encounter notes, upcoming appointments, etc.  Non-urgent messages can be sent to your provider as well.   To learn more about what you can do with MyChart, go to [email or web address].   Other Instructions    Please report to Radiology at the [facility] Main Entrance 30 minutes early for your test.  [address] [place], [place] [address]                          How to Prepare for Your Cardiac PET/CT Stress Test:  Nothing to eat or drink, except water, 3 hours prior to arrival time.  NO caffeine/decaffeinated products, or chocolate 12 hours prior to arrival. (Please note decaffeinated beverages (teas/coffees) still contain caffeine).  If you have caffeine within 12 hours prior, the test will need to be rescheduled.  Medication instructions: Do not take erectile dysfunction medications for 72 hours prior to test (sildenafil, tadalafil) Do not take nitrates (isosorbide  mononitrate, Ranexa ) the day before or day of test Do not take tamsulosin  the day before or morning of test Hold theophylline containing medications for 12 hours. Hold Dipyridamole 48 hours prior to the test.  Diabetic Preparation: If able to eat breakfast prior to 3 hour fasting, you may take all medications, including your insulin . Do not worry if you miss your breakfast dose of insulin  - start at your next meal. If you do not eat  prior to 3 hour fast-Hold all diabetes (oral and insulin ) medications. Patients who wear a continuous glucose monitor MUST remove the device prior to scanning.  You may take your remaining medications with water.  NO perfume, cologne or lotion on chest or abdomen area.   Total time is 1 to 2 hours; you may want to bring reading material for the waiting time.    In preparation for your appointment, medication and supplies will be purchased.  Appointment availability is limited, so if you need to cancel or reschedule, please call the Radiology Department Scheduler at [phone] 24 hours in advance to avoid a cancellation fee of $100.00  What to Expect When you Arrive:  Once you arrive and check in for your appointment, you will be taken to a preparation room within the Radiology Department.  A technologist or Nurse will obtain your medical history, verify that you are correctly prepped for the exam, and explain the procedure.  Afterwards, an IV will be started in your arm and electrodes will be placed on your skin for EKG monitoring during the stress portion of the exam. Then you will be escorted to the PET/CT scanner.  There, staff will get you positioned on the scanner and obtain a blood pressure and EKG.  During the exam, you will  continue to be connected to the EKG and blood pressure machines.  A small, safe amount of a radioactive tracer will be injected in your IV to obtain a series of pictures of your heart along with an injection of a stress agent.    After your Exam:  It is recommended that you eat a meal and drink a caffeinated beverage to counter act any effects of the stress agent.  Drink plenty of fluids for the remainder of the day and urinate frequently for the first couple of hours after the exam.  Your doctor will inform you of your test results within 7-10 business days.  For more information and frequently asked questions, please visit our  website: [email or web address]  For questions about your test or how to prepare for your test, please call: Cardiac Imaging Nurse Navigators Office: [phone]        Signed, Patrick Sor, MD  06/10/2024 12:58 PM    [facility] [address], [address], [place], [place]  [address] Phone: [phone]

## 2024-06-08 NOTE — Patient Instructions (Addendum)
 Medication Instructions:  Your physician recommends that you continue on your current medications as directed. Please refer to the Current Medication list given to you today.  *If you need a refill on your cardiac medications before your next appointment, please call your pharmacy*   Testing/Procedures: See below  Follow-Up: At [facility], you and your health needs are our priority.  As part of our continuing mission to provide you with exceptional heart care, our providers are all part of one team.  This team includes your primary Cardiologist (physician) and Advanced Practice Providers or APPs (Physician Assistants and Nurse Practitioners) who all work together to provide you with the care you need, when you need it.  Your next appointment:   3 month(s)  Provider:   Dr. Alvis Ba  We recommend signing up for the patient portal called MyChart.  Sign up information is provided on this After Visit Summary.  MyChart is used to connect with patients for Virtual Visits (Telemedicine).  Patients are able to view lab/test results, encounter notes, upcoming appointments, etc.  Non-urgent messages can be sent to your provider as well.   To learn more about what you can do with MyChart, go to [email or web address].   Other Instructions    Please report to Radiology at the [facility] Main Entrance 30 minutes early for your test.  [address] [place], [place] [address]                          How to Prepare for Your Cardiac PET/CT Stress Test:  Nothing to eat or drink, except water, 3 hours prior to arrival time.  NO caffeine/decaffeinated products, or chocolate 12 hours prior to arrival. (Please note decaffeinated beverages (teas/coffees) still contain caffeine).  If you have caffeine within 12 hours prior, the test will need to be rescheduled.  Medication instructions: Do not take erectile dysfunction medications for 72 hours prior to test (sildenafil,  tadalafil) Do not take nitrates (isosorbide  mononitrate, Ranexa ) the day before or day of test Do not take tamsulosin  the day before or morning of test Hold theophylline containing medications for 12 hours. Hold Dipyridamole 48 hours prior to the test.  Diabetic Preparation: If able to eat breakfast prior to 3 hour fasting, you may take all medications, including your insulin . Do not worry if you miss your breakfast dose of insulin  - start at your next meal. If you do not eat prior to 3 hour fast-Hold all diabetes (oral and insulin ) medications. Patients who wear a continuous glucose monitor MUST remove the device prior to scanning.  You may take your remaining medications with water.  NO perfume, cologne or lotion on chest or abdomen area.   Total time is 1 to 2 hours; you may want to bring reading material for the waiting time.    In preparation for your appointment, medication and supplies will be purchased.  Appointment availability is limited, so if you need to cancel or reschedule, please call the Radiology Department Scheduler at [phone] 24 hours in advance to avoid a cancellation fee of $100.00  What to Expect When you Arrive:  Once you arrive and check in for your appointment, you will be taken to a preparation room within the Radiology Department.  A technologist or Nurse will obtain your medical history, verify that you are correctly prepped for the exam, and explain the procedure.  Afterwards, an IV will be started in your arm and electrodes will  be placed on your skin for EKG monitoring during the stress portion of the exam. Then you will be escorted to the PET/CT scanner.  There, staff will get you positioned on the scanner and obtain a blood pressure and EKG.  During the exam, you will continue to be connected to the EKG and blood pressure machines.  A small, safe amount of a radioactive tracer will be injected in your IV to obtain a series of pictures of your heart along  with an injection of a stress agent.    After your Exam:  It is recommended that you eat a meal and drink a caffeinated beverage to counter act any effects of the stress agent.  Drink plenty of fluids for the remainder of the day and urinate frequently for the first couple of hours after the exam.  Your doctor will inform you of your test results within 7-10 business days.  For more information and frequently asked questions, please visit our website: [email or web address]  For questions about your test or how to prepare for your test, please call: Cardiac Imaging Nurse Navigators Office: [phone]

## 2024-06-09 ENCOUNTER — Other Ambulatory Visit: Payer: Self-pay | Admitting: General Practice

## 2024-06-09 ENCOUNTER — Encounter: Payer: Self-pay | Admitting: Cardiovascular Disease

## 2024-06-10 ENCOUNTER — Encounter: Payer: Self-pay | Admitting: Cardiovascular Disease

## 2024-06-29 ENCOUNTER — Encounter: Payer: Self-pay | Admitting: Urology

## 2024-06-29 NOTE — Progress Notes (Signed)
 06/30/2024 3:58 PM   Patrick [name] 02/04/1956 [id number]  Referring provider: [name] ORN, MD [address] [facility] [place],  [place] [address]  Urological history: 1. Elevated PSA - biopsy 2004 iPSA 6.8 negative - biopsy 2008 iPSA 6.3 negative   2. BPH with LU TS - PSA (04/2024) 4.18  -~15 years ago TUMT - Managed with tamsulosin  0.4 mg twice daily   3. Pelvic floor dysfunction - documented increased EMG activity with all voids from urodynamics in 2018 - underwent PT   4. Nocturia - managed with Myrbetriq  25 mg daily - not interested in sleep study   5. ED -contributing factors of age, CAD, diabetes, HLD, HTN and former smoker -not a candidate for PDE5i's secondary to nitrate use -failed ICI -using alternative measures for sexual activity    6. Nephrolithiasis -stone composition 100% calcium  oxalate -spontaneous passage of 8 mm left UVJ stone (08/2022)   Chief Complaint  Patient presents with   BPH with obstruction/lower urinary tract symptoms   HPI: Patrick [name] is a 68 y.o. male who presents today for one a sore in the genital area.    Previous records reviewed.     He has had a bump come up two weeks ago.  It is slightly painful.  It has not drained.  Patient denies any modifying or aggravating factors.  Patient denies any recent UTI's, gross hematuria, dysuria or suprapubic/flank pain.  Patient denies any fevers, chills, nausea or vomiting.    He is also in need of a letter to be excused from [employer] due to his urinary symptoms.  He is also wondering what to think of his latest PSA of 4.18.    PSA (04/2024) 4.18  Serum creatinine (04/2024) 1.2, eGFR 65  Hemoglobin A1c (04/2024) 5.7  PMH: Past Medical History:  Diagnosis Date   CAD in native artery    a. anterolateral STEMI 07/2017 s/p DES to prox-mid LAD, EF 55%.   GERD (gastroesophageal reflux disease)    History of kidney stones    Hyperlipidemia     Hypertension    Myocardial infarction [facility])    Overactive bladder    Presence of permanent cardiac pacemaker    Renal calculi    Rheumatoid arthritis (HCC)    Syncope    Uncontrolled diabetes mellitus     Surgical History: Past Surgical History:  Procedure Laterality Date   CORONARY/GRAFT ACUTE MI REVASCULARIZATION N/A 08/04/2017   Procedure: Coronary/Graft Acute MI Revascularization;  Surgeon: Burnard Debby LABOR, MD;  [facility];  Service: Cardiovascular;  Laterality: N/A;   LEFT HEART CATH AND CORONARY ANGIOGRAPHY N/A 08/04/2017   Procedure: LEFT HEART CATH AND CORONARY ANGIOGRAPHY;  Surgeon: Burnard Debby LABOR, MD;  Location: [facility] INVASIVE CV LAB;  Service: Cardiovascular;  Laterality: N/A;   LEFT HEART CATH AND CORONARY ANGIOGRAPHY N/A 03/21/2018   Procedure: LEFT HEART CATH AND CORONARY ANGIOGRAPHY;  Surgeon: Burnard Debby LABOR, MD;  Location: [facility] INV[facility] CV LAB;  Service: Cardiovascular;  Laterality: N/A;   PACEMAKER IMPLANT N/A 05/17/2022   Procedure: PACEMAKER IMPLANT;  Surgeon: [name] Danelle ORN, MD;  [facility];  Service: Cardiovascular;  Laterality: N/A;   PROSTATE SURGERY     in office Dr Kassie   TEMPORARY PACEMAKER N/A 07/14/2021   Procedure: TEMPORARY PACEMAKER;  Surgeon: Anner Alm ORN, MD;  [facility]: [facility] INVASIVE CV LAB;  Service: Cardiovascular;  Laterality: N/A;   TONSILLECTOMY  Home Medications:  Allergies as of 06/30/2024   No Known Allergies      Medication List        Accurate as of June 30, 2024  3:58 PM. If you have any questions, ask your nurse or doctor.          ABC PLUS SENIOR PO Take 1 tablet by mouth daily.   ACETYL L-CARNITINE PO Take 1,500 mg by mouth daily.   ALPHA LIPOIC ACID PO Take 600 mg by mouth daily.   amLODipine  5 MG tablet Commonly known as: NORVASC  Take 5 mg by mouth daily.   apixaban  5 MG Tabs tablet Commonly known as: ELIQUIS  Take 1 tablet (5 mg total) by mouth 2 (two) times daily.    ascorbic acid 500 MG tablet Commonly known as: VITAMIN C Take 500 mg by mouth daily.   Coenzyme Q10 200 MG capsule Take 200 mg by mouth daily.   FISH OIL OMEGA-3 PO Take 2 capsules by mouth 2 (two) times daily.   folic acid 1 MG tablet Commonly known as: FOLVITE Take 1 mg by mouth daily.   GLUCOSAMINE 1500 COMPLEX PO Take 1,500 mg by mouth daily.   hydroxychloroquine 200 MG tablet Commonly known as: PLAQUENIL Take 1 tablet by mouth 2 (two) times daily.   isosorbide  mononitrate 60 MG 24 hr tablet Commonly known as: IMDUR  Take 1 tablet (60 mg total) by mouth daily.   losartan -hydrochlorothiazide 100-12.5 MG tablet Commonly known as: HYZAAR Take 1 tablet by mouth daily.   Magnesium 250 MG Tabs Take 250 mg by mouth daily.   metFORMIN 500 MG 24 hr tablet Commonly known as: GLUCOPHAGE-XR Take 1,000 mg by mouth 2 (two) times daily with a meal.   methotrexate 2.5 MG tablet Commonly known as: RHEUMATREX Take 2.5 mg by mouth as directed. Take 1 tablet for 5 days out of 7, weekly   nitroGLYCERIN  0.4 MG SL tablet Commonly known as: NITROSTAT  DISSOLVE ONE TABLET UNDER TONGUE AS NEEDED FOR CHEST PAIN EVERY 5 MINUTES FOR 3 DOSES   nystatin cream Commonly known as: MYCOSTATIN Apply 1 application. topically daily as needed for dry skin.   pantoprazole  40 MG tablet Commonly known as: PROTONIX  Take 40 mg by mouth daily.   Potassium 99 MG Tabs Take 99 mg by mouth daily.   pyridOXINE 50 MG tablet Commonly known as: VITAMIN B6 Take 50 mg by mouth daily.   ranolazine  1000 MG SR tablet Commonly known as: RANEXA  TAKE 1 TABLET(1000 MG) BY MOUTH TWICE DAILY   rosuvastatin  5 MG tablet Commonly known as: CRESTOR  Take 5 mg by mouth daily.   SILICA PO Take 15 mLs by mouth daily. Plant based collagen booster   sulfamethoxazole -trimethoprim  800-160 MG tablet Commonly known as: BACTRIM  DS Take 1 tablet by mouth every 12 (twelve) hours. Started by: CLOTILDA CORNWALL    tamsulosin  0.4 MG Caps capsule Commonly known as: FLOMAX  Take 2 capsules (0.8 mg total) by mouth daily.   triamcinolone cream 0.1 % Commonly known as: KENALOG Apply 1 application. topically daily as needed (rash).   Trulicity 0.75 MG/0.5ML Soaj Generic drug: Dulaglutide Inject 0.75 mg into the skin once a week.   VITAMIN B-12 PO Take by mouth.        Allergies: No Known Allergies  Family History: Family History  Problem Relation Age of Onset   Dementia Mother    Heart disease Father    Heart attack Father     Social History:  reports that he has quit  smoking. His smoking use included cigarettes. He has never used smokeless tobacco. He reports current alcohol use of about 2.0 standard drinks of alcohol per week. He reports that he does not use drugs.  ROS: Pertinent ROS in HPI  Physical Exam: BP 128/78   Pulse 61   Ht 5' 11 (1.803 m)   Wt 204 lb (92.5 kg)   BMI 28.45 kg/m   Constitutional:  Well nourished. Alert and oriented, No acute distress. HEENT: [place] AT, moist mucus membranes.  Trachea midline Cardiovascular: No clubbing, cyanosis, or edema. Respiratory: Normal respiratory effort, no increased work of breathing. GU: No CVA tenderness.  No bladder fullness or masses.  Patient with circumcised phallus. Urethral meatus is patent.  No penile discharge. No penile lesions or rashes. Scrotum without lesions, cysts, rashes and/or edema.  Testicles are located scrotally bilaterally. No masses are appreciated in the testicles. Left and right epididymis are normal.   A 2 cm x 2 cm sebacous cyst abscess is seen on the left hemi scrotum.   Neurologic: Grossly intact, no focal deficits, moving all 4 extremities. Psychiatric: Normal mood and affect.   Laboratory data See HPI and EPIC I have reviewed the labs.   Pertinent Imaging: N/A  Procedure: Consent: Verbal consent was obtained Anesthesia: 1% Lidocaine  5 cc for local inflitration Technique: Area was prepped  with Betadine A linear incision is made using a # 15 blade Purulent material was expressed (~ 1 cc) and sent for culture and sensitivity Cavity was explored with sterile Q-tips Wound covered with 4 x 4 Guaze and a sanitary pad to capture drainage  Assessment & Plan:    1. Infected sebaceous cyst  - continue warm compresses - change dressings daily - start empiric antibiotics, Septra  DS - await culture results, adjust antibiotics if needed - advised to shower only and no tub baths  - educated on red flag signs, worsening redness, fever, or drainage - follow up next week for recheck   2. OAB - letter to be excused from [employer] given to patient  3. Elevated PSA - reviewed his previous PSA's and his PSA's seem to vacillate between 3 and 4 - we will see him in December for receck   Return in about 1 week (around 07/07/2024) for wound recheck .  These notes generated with voice recognition software. I apologize for typographical errors.  CLOTILDA HELON RIGGERS  [facility] [address]  [address] [place], [place] [address] [phone]

## 2024-06-30 ENCOUNTER — Encounter: Payer: Self-pay | Admitting: Urology

## 2024-06-30 ENCOUNTER — Ambulatory Visit (INDEPENDENT_AMBULATORY_CARE_PROVIDER_SITE_OTHER): Admitting: Urology

## 2024-06-30 VITALS — BP 128/78 | HR 61 | Ht 71.0 in | Wt 204.0 lb

## 2024-06-30 DIAGNOSIS — L02214 Cutaneous abscess of groin: Secondary | ICD-10-CM

## 2024-06-30 DIAGNOSIS — N489 Disorder of penis, unspecified: Secondary | ICD-10-CM

## 2024-06-30 MED ORDER — SULFAMETHOXAZOLE-TRIMETHOPRIM 800-160 MG PO TABS: 1.0000 | ORAL_TABLET | Freq: Two times a day (BID) | ORAL | 0 refills | Status: DC

## 2024-07-03 NOTE — Progress Notes (Unsigned)
 07/04/2024 5:06 PM   Patrick [name] 1956/06/02 [id number]  Referring provider: [name] ORN, MD [address] [facility] [place],  [place] [address]  Urological history: 1. Elevated PSA - biopsy 2004 iPSA 6.8 negative - biopsy 2008 iPSA 6.3 negative   2. BPH with LU TS - PSA (04/2024) 4.18  -~15 years ago TUMT - Managed with tamsulosin  0.4 mg twice daily   3. Pelvic floor dysfunction - documented increased EMG activity with all voids from urodynamics in 2018 - underwent PT   4. Nocturia - managed with Myrbetriq  25 mg daily - not interested in sleep study   5. ED -contributing factors of age, CAD, diabetes, HLD, HTN and former smoker -not a candidate for PDE5i's secondary to nitrate use -failed ICI -using alternative measures for sexual activity    6. Nephrolithiasis -stone composition 100% calcium  oxalate -spontaneous passage of 8 mm left UVJ stone (08/2022)   No chief complaint on file.  HPI: Patrick [name] is a 68 y.o. male who presents today for one week follow up.    Previous records reviewed.     At his visit on 06/30/2024, he has had a bump come up two weeks ago.  It is slightly painful.  It has not drained.  Patient denies any modifying or aggravating factors.  Patient denies any recent UTI's, gross hematuria, dysuria or suprapubic/flank pain.  Patient denies any fevers, chills, nausea or vomiting.  He is also in need of a letter to be excused from [employer] due to his urinary symptoms.  He is also wondering what to think of his latest PSA of 4.18.   PSA (04/2024) 4.18.  Serum creatinine (04/2024) 1.2, eGFR 65.  Hemoglobin A1c (04/2024) 5.7.   An abscess was seen in the left hemiscrotum and was I & D.    He was started on Septra  DS.    PMH: Past Medical History:  Diagnosis Date   CAD in native artery    a. anterolateral STEMI 07/2017 s/p DES to prox-mid LAD, EF 55%.   GERD (gastroesophageal reflux disease)    History of kidney  stones    Hyperlipidemia    Hypertension    Myocardial infarction [facility])    Overactive bladder    Presence of permanent cardiac pacemaker    Renal calculi    Rheumatoid arthritis (HCC)    Syncope    Uncontrolled diabetes mellitus     Surgical History: Past Surgical History:  Procedure Laterality Date   CORONARY/GRAFT ACUTE MI REVASCULARIZATION N/A 08/04/2017   Procedure: Coronary/Graft Acute MI Revascularization;  Surgeon: Burnard Debby LABOR, MD;  [facility];  Service: Cardiovascular;  Laterality: N/A;   LEFT HEART CATH AND CORONARY ANGIOGRAPHY N/A 08/04/2017   Procedure: LEFT HEART CATH AND CORONARY ANGIOGRAPHY;  Surgeon: Burnard Debby LABOR, MD;  [facility];  Service: Cardiovascular;  Laterality: N/A;   LEFT HEART CATH AND CORONARY ANGIOGRAPHY N/A 03/21/2018   Procedure: LEFT HEART CATH AND CORONARY ANGIOGRAPHY;  Surgeon: Burnard Debby LABOR, MD;  [facility];  Service: Cardiovascular;  Laterality: N/A;   PACEMAKER IMPLANT N/A 05/17/2022   Procedure: PACEMAKER IMPLANT;  Surgeon: [name] Danelle ORN, MD;  [facility];  Service: Cardiovascular;  Laterality: N/A;   PROSTATE SURGERY     in office Dr Kassie   TEMPORARY PACEMAKER N/A 07/14/2021   Procedure: TEMPORARY PACEMAKER;  Surgeon: Anner Alm ORN, MD;  Location: [facility]  INVASIVE CV LAB;  Service: Cardiovascular;  Laterality: N/A;   TONSILLECTOMY      Home Medications:  Allergies as of 07/04/2024   No Known Allergies      Medication List        Accurate as of July 03, 2024  5:06 PM. If you have any questions, ask your nurse or doctor.          ABC PLUS SENIOR PO Take 1 tablet by mouth daily.   ACETYL L-CARNITINE PO Take 1,500 mg by mouth daily.   ALPHA LIPOIC ACID PO Take 600 mg by mouth daily.   amLODipine  5 MG tablet Commonly known as: NORVASC  Take 5 mg by mouth daily.   apixaban  5 MG Tabs tablet Commonly known as: ELIQUIS  Take 1 tablet (5 mg total) by  mouth 2 (two) times daily.   ascorbic acid 500 MG tablet Commonly known as: VITAMIN C Take 500 mg by mouth daily.   Coenzyme Q10 200 MG capsule Take 200 mg by mouth daily.   FISH OIL OMEGA-3 PO Take 2 capsules by mouth 2 (two) times daily.   folic acid 1 MG tablet Commonly known as: FOLVITE Take 1 mg by mouth daily.   GLUCOSAMINE 1500 COMPLEX PO Take 1,500 mg by mouth daily.   hydroxychloroquine 200 MG tablet Commonly known as: PLAQUENIL Take 1 tablet by mouth 2 (two) times daily.   isosorbide  mononitrate 60 MG 24 hr tablet Commonly known as: IMDUR  Take 1 tablet (60 mg total) by mouth daily.   losartan -hydrochlorothiazide 100-12.5 MG tablet Commonly known as: HYZAAR Take 1 tablet by mouth daily.   Magnesium 250 MG Tabs Take 250 mg by mouth daily.   metFORMIN 500 MG 24 hr tablet Commonly known as: GLUCOPHAGE-XR Take 1,000 mg by mouth 2 (two) times daily with a meal.   methotrexate 2.5 MG tablet Commonly known as: RHEUMATREX Take 2.5 mg by mouth as directed. Take 1 tablet for 5 days out of 7, weekly   nitroGLYCERIN  0.4 MG SL tablet Commonly known as: NITROSTAT  DISSOLVE ONE TABLET UNDER TONGUE AS NEEDED FOR CHEST PAIN EVERY 5 MINUTES FOR 3 DOSES   nystatin cream Commonly known as: MYCOSTATIN Apply 1 application. topically daily as needed for dry skin.   pantoprazole  40 MG tablet Commonly known as: PROTONIX  Take 40 mg by mouth daily.   Potassium 99 MG Tabs Take 99 mg by mouth daily.   pyridOXINE 50 MG tablet Commonly known as: VITAMIN B6 Take 50 mg by mouth daily.   ranolazine  1000 MG SR tablet Commonly known as: RANEXA  TAKE 1 TABLET(1000 MG) BY MOUTH TWICE DAILY   rosuvastatin  5 MG tablet Commonly known as: CRESTOR  Take 5 mg by mouth daily.   SILICA PO Take 15 mLs by mouth daily. Plant based collagen booster   sulfamethoxazole -trimethoprim  800-160 MG tablet Commonly known as: BACTRIM  DS Take 1 tablet by mouth every 12 (twelve) hours.    tamsulosin  0.4 MG Caps capsule Commonly known as: FLOMAX  Take 2 capsules (0.8 mg total) by mouth daily.   triamcinolone cream 0.1 % Commonly known as: KENALOG Apply 1 application. topically daily as needed (rash).   Trulicity 0.75 MG/0.5ML Soaj Generic drug: Dulaglutide Inject 0.75 mg into the skin once a week.   VITAMIN B-12 PO Take by mouth.        Allergies: No Known Allergies  Family History: Family History  Problem Relation Age of Onset   Dementia Mother    Heart disease Father    Heart attack  Father     Social History:  reports that he has quit smoking. His smoking use included cigarettes. He has never used smokeless tobacco. He reports current alcohol use of about 2.0 standard drinks of alcohol per week. He reports that he does not use drugs.  ROS: Pertinent ROS in HPI  Physical Exam: There were no vitals taken for this visit.  Constitutional:  Well nourished. Alert and oriented, No acute distress. HEENT: [place] AT, moist mucus membranes.  Trachea midline, no masses. Cardiovascular: No clubbing, cyanosis, or edema. Respiratory: Normal respiratory effort, no increased work of breathing. GI: Abdomen is soft, non tender, non distended, no abdominal masses. Liver and spleen not palpable.  No hernias appreciated.  Stool sample for occult testing is not indicated.   GU: No CVA tenderness.  No bladder fullness or masses.  Patient with circumcised/uncircumcised phallus. ***Foreskin easily retracted***  Urethral meatus is patent.  No penile discharge. No penile lesions or rashes. Scrotum without lesions, cysts, rashes and/or edema.  Testicles are located scrotally bilaterally. No masses are appreciated in the testicles. Left and right epididymis are normal. Rectal: Patient with  normal sphincter tone. Anus and perineum without scarring or rashes. No rectal masses are appreciated. Prostate is approximately *** grams, *** nodules are appreciated. Seminal vesicles are  normal. Skin: No rashes, bruises or suspicious lesions. Lymph: No cervical or inguinal adenopathy. Neurologic: Grossly intact, no focal deficits, moving all 4 extremities. Psychiatric: Normal mood and affect.   Laboratory data See HPI and EPIC I have reviewed the labs.   Pertinent Imaging: N/A   Assessment & Plan:    1. Infected sebaceous cyst  -  2. OAB -  3. Elevated PSA - we will see him in December for receck   No follow-ups on file.  These notes generated with voice recognition software. I apologize for typographical errors.  CLOTILDA HELON RIGGERS  [facility] [address]  [address] [place], [place] [address] [phone]

## 2024-07-04 ENCOUNTER — Encounter: Payer: Self-pay | Admitting: Urology

## 2024-07-04 ENCOUNTER — Ambulatory Visit (INDEPENDENT_AMBULATORY_CARE_PROVIDER_SITE_OTHER): Admitting: Urology

## 2024-07-04 VITALS — BP 118/69 | HR 74 | Ht 71.0 in | Wt 204.0 lb

## 2024-07-04 DIAGNOSIS — L02214 Cutaneous abscess of groin: Secondary | ICD-10-CM

## 2024-07-04 DIAGNOSIS — R972 Elevated prostate specific antigen [PSA]: Secondary | ICD-10-CM

## 2024-07-04 DIAGNOSIS — N3281 Overactive bladder: Secondary | ICD-10-CM

## 2024-07-04 DIAGNOSIS — L089 Local infection of the skin and subcutaneous tissue, unspecified: Secondary | ICD-10-CM

## 2024-07-04 LAB — ANAEROBIC AND AEROBIC CULTURE

## 2024-07-05 ENCOUNTER — Other Ambulatory Visit: Payer: Self-pay

## 2024-07-05 ENCOUNTER — Ambulatory Visit: Payer: Self-pay | Admitting: Urology

## 2024-07-05 DIAGNOSIS — Z87442 Personal history of urinary calculi: Secondary | ICD-10-CM

## 2024-07-05 MED ORDER — AMOXICILLIN-POT CLAVULANATE 875-125 MG PO TABS: 1.0000 | ORAL_TABLET | Freq: Two times a day (BID) | ORAL | 0 refills | Status: DC

## 2024-07-10 NOTE — Progress Notes (Signed)
 Remote pacemaker transmission.

## 2024-07-10 NOTE — Addendum Note (Signed)
 Addended by: VICCI SELLER A on: 07/10/2024 02:51 PM   Modules accepted: Orders

## 2024-07-11 ENCOUNTER — Encounter (HOSPITAL_COMMUNITY): Payer: Self-pay

## 2024-07-12 ENCOUNTER — Other Ambulatory Visit

## 2024-07-12 DIAGNOSIS — Z87442 Personal history of urinary calculi: Secondary | ICD-10-CM

## 2024-07-13 ENCOUNTER — Ambulatory Visit
Admission: RE | Admit: 2024-07-13 | Discharge: 2024-07-13 | Disposition: A | Discharge status: Home / Self Care | Source: Ambulatory Visit | Attending: Cardiovascular Disease | Admitting: Cardiovascular Disease

## 2024-07-13 ENCOUNTER — Ambulatory Visit: Payer: Self-pay | Admitting: Cardiovascular Disease

## 2024-07-13 ENCOUNTER — Ambulatory Visit: Payer: Self-pay | Admitting: Urology

## 2024-07-13 DIAGNOSIS — K8020 Calculus of gallbladder without cholecystitis without obstruction: Secondary | ICD-10-CM | POA: Insufficient documentation

## 2024-07-13 DIAGNOSIS — R079 Chest pain, unspecified: Secondary | ICD-10-CM

## 2024-07-13 DIAGNOSIS — I2510 Atherosclerotic heart disease of native coronary artery without angina pectoris: Secondary | ICD-10-CM | POA: Insufficient documentation

## 2024-07-13 LAB — NM PET CT CARDIAC PERFUSION MULTI W/ABSOLUTE BLOODFLOW
LV dias vol: 129 mL (ref 62–150)
LV sys vol: 54 mL (ref 4.2–5.8)
MBFR: 1.66
Nuc Rest EF: 52 %
Nuc Stress EF: 58 %
Peak HR: 88 {beats}/min
Rest HR: 70 {beats}/min
Rest MBF: 0.74 ml/g/min
Rest Nuclear Isotope Dose: 23.9 mCi
SRS: 0
SSS: 6
Stress MBF: 1.23 ml/g/min
Stress Nuclear Isotope Dose: 23.9 mCi
TID: 1.1

## 2024-07-13 LAB — BASIC METABOLIC PANEL WITH GFR
BUN/Creatinine Ratio: 18 (ref 10–24)
BUN: 22 mg/dL (ref 8–27)
CO2: 23 mmol/L (ref 20–29)
Calcium: 9 mg/dL (ref 8.6–10.2)
Chloride: 102 mmol/L (ref 96–106)
Creatinine, Ser: 1.2 mg/dL (ref 0.76–1.27)
Glucose: 105 mg/dL — ABNORMAL HIGH (ref 70–99)
Potassium: 4.6 mmol/L (ref 3.5–5.2)
Sodium: 142 mmol/L (ref 134–144)
eGFR: 65 mL/min/1.73 (ref >59)

## 2024-07-13 LAB — CBC WITH DIFFERENTIAL/PLATELET
Basophils Absolute: 0 x10E3/uL (ref 0.0–0.2)
Basos: 1 %
EOS (ABSOLUTE): 0.1 x10E3/uL (ref 0.0–0.4)
Eos: 1 %
Hematocrit: 39 % (ref 37.5–51.0)
Hemoglobin: 13 g/dL (ref 13.0–17.7)
Immature Grans (Abs): 0 x10E3/uL (ref 0.0–0.1)
Immature Granulocytes: 0 %
Lymphocytes Absolute: 1.7 x10E3/uL (ref 0.7–3.1)
Lymphs: 36 %
MCH: 33.7 pg — ABNORMAL HIGH (ref 26.6–33.0)
MCHC: 33.3 g/dL (ref 31.5–35.7)
MCV: 101 fL — ABNORMAL HIGH (ref 79–97)
Monocytes Absolute: 0.3 x10E3/uL (ref 0.1–0.9)
Monocytes: 7 %
Neutrophils Absolute: 2.5 x10E3/uL (ref 1.4–7.0)
Neutrophils: 55 %
Platelets: 177 x10E3/uL (ref 150–450)
RBC: 3.86 x10E6/uL — ABNORMAL LOW (ref 4.14–5.80)
RDW: 13 % (ref 11.6–15.4)
WBC: 4.6 x10E3/uL (ref 3.4–10.8)

## 2024-07-13 MED ORDER — REGADENOSON 0.4 MG/5ML IV SOLN
INTRAVENOUS | Status: AC
  Filled 2024-07-13: qty 5

## 2024-07-13 MED ORDER — RUBIDIUM RB82 GENERATOR (RUBYFILL)
25.0000 | PACK | Freq: Once | INTRAVENOUS | Status: AC
  Administered 2024-07-13: 23.93 via INTRAVENOUS

## 2024-07-13 MED ORDER — RUBIDIUM RB82 GENERATOR (RUBYFILL)
25.0000 | PACK | Freq: Once | INTRAVENOUS | Status: AC
  Administered 2024-07-13: 23.9 via INTRAVENOUS

## 2024-07-13 MED ORDER — REGADENOSON 0.4 MG/5ML IV SOLN
0.4000 mg | Freq: Once | INTRAVENOUS | Status: AC
  Administered 2024-07-13: 0.4 mg via INTRAVENOUS
  Filled 2024-07-13: qty 5

## 2024-07-13 NOTE — Progress Notes (Signed)
 Patient presents for a cardiac PET stress test and tolerated procedure without incident. Patient maintained acceptable vital signs throughout the test and was offered caffeine after test.  Patient ambulated out of department with a steady gait.

## 2024-07-17 NOTE — Telephone Encounter (Signed)
 Left message in regards to PET results:  Croitoru, Mihai, MD to Me (Selected Message)    07/13/24  9:07 PM Result Note The PET scan is abnormal, suggesting reduced blood flow to the lateral wall of the heart.  Please schedule an office appointment to discuss further evaluation, which could include cardiac catheterization or possibly an adjustment in his antianginal medications. Seen by patient Patrick [name] on 07/15/2024  7:04 PM   Left message with Call back number: Can add him on for an appointment on: 07/18/24 at 10:20  or 08/08/24 at 10:00 per Dr Francyne. Asked him to let us  know if either of these days work for him

## 2024-07-18 ENCOUNTER — Encounter: Payer: Self-pay | Admitting: Cardiovascular Disease

## 2024-07-18 ENCOUNTER — Ambulatory Visit: Attending: Cardiovascular Disease | Admitting: Cardiovascular Disease

## 2024-07-18 VITALS — BP 120/62 | HR 70 | Ht 71.0 in | Wt 210.0 lb

## 2024-07-18 DIAGNOSIS — M069 Rheumatoid arthritis, unspecified: Secondary | ICD-10-CM | POA: Diagnosis present

## 2024-07-18 DIAGNOSIS — I480 Paroxysmal atrial fibrillation: Secondary | ICD-10-CM | POA: Diagnosis not present

## 2024-07-18 DIAGNOSIS — I442 Atrioventricular block, complete: Secondary | ICD-10-CM | POA: Insufficient documentation

## 2024-07-18 DIAGNOSIS — I25118 Atherosclerotic heart disease of native coronary artery with other forms of angina pectoris: Secondary | ICD-10-CM | POA: Diagnosis not present

## 2024-07-18 DIAGNOSIS — E118 Type 2 diabetes mellitus with unspecified complications: Secondary | ICD-10-CM | POA: Diagnosis present

## 2024-07-18 DIAGNOSIS — I10 Essential (primary) hypertension: Secondary | ICD-10-CM | POA: Insufficient documentation

## 2024-07-18 DIAGNOSIS — E785 Hyperlipidemia, unspecified: Secondary | ICD-10-CM | POA: Insufficient documentation

## 2024-07-18 DIAGNOSIS — Z950 Presence of cardiac pacemaker: Secondary | ICD-10-CM | POA: Diagnosis not present

## 2024-07-18 DIAGNOSIS — R0989 Other specified symptoms and signs involving the circulatory and respiratory systems: Secondary | ICD-10-CM | POA: Diagnosis present

## 2024-07-18 DIAGNOSIS — I700 Atherosclerosis of aorta: Secondary | ICD-10-CM | POA: Insufficient documentation

## 2024-07-18 MED ORDER — METOPROLOL TARTRATE 25 MG PO TABS: 25.0000 mg | ORAL_TABLET | Freq: Two times a day (BID) | ORAL | 1 refills | Status: DC

## 2024-07-18 NOTE — Patient Instructions (Addendum)
 Medication Instructions:  Your physician has recommended you make the following change in your medication:   ** Begin Metoprolol  25mg  - 1 tablet by mouth twice daily   *If you need a refill on your cardiac medications before your next appointment, please call your pharmacy*  Lab Work: None ordered.  If you have labs (blood work) drawn today and your tests are completely normal, you will receive your results only by: MyChart Message (if you have MyChart) OR A paper copy in the mail If you have any lab test that is abnormal or we need to change your treatment, we will call you to review the results.  Testing/Procedures: Your physician has requested that you have an ankle brachial index (ABI). During this test an ultrasound and blood pressure cuff are used to evaluate the arteries that supply the arms and legs with blood. Allow thirty minutes for this exam. There are no restrictions or special instructions.  Please note: We ask at that you not bring children with you during ultrasound (echo/ vascular) testing. Due to room size and safety concerns, children are not allowed in the ultrasound rooms during exams. Our front office staff cannot provide observation of children in our lobby area while testing is being conducted. An adult accompanying a patient to their appointment will only be allowed in the ultrasound room at the discretion of the ultrasound technician under special circumstances. We apologize for any inconvenience.   Follow-Up: At [facility], you and your health needs are our priority.  As part of our continuing mission to provide you with exceptional heart care, our providers are all part of one team.  This team includes your primary Cardiologist (physician) and Advanced Practice Providers or APPs (Physician Assistants and Nurse Practitioners) who all work together to provide you with the care you need, when you need it.  Your next appointment:   As scheduled  We  recommend signing up for the patient portal called MyChart.  Sign up information is provided on this After Visit Summary.  MyChart is used to connect with patients for Virtual Visits (Telemedicine).  Patients are able to view lab/test results, encounter notes, upcoming appointments, etc.  Non-urgent messages can be sent to your provider as well.   To learn more about what you can do with MyChart, go to [email or web address].

## 2024-07-18 NOTE — Progress Notes (Signed)
 Cardiology Office Note   Date:  07/18/2024  ID:  Patrick [name], DOB 28-Mar-1956, MRN [id number] PCP: Patrick [name] ORN, MD  [facility] Providers Cardiologist:  Patrick Balding, MD     History of Present Illness Patrick [name] is a 68 y.o. male with a history of CAD (STEMI with PCI-LAD 2018, patent stent by repeat catheterization 2019 for false positive SPECT), paroxysmal atrial fibrillation, paroxysmal complete heart block s/p dual-chamber permanent pacemaker ([employer] [place], Dr. [name], 2023), DM2, hyperlipidemia, HTN, rheumatoid arthritis transitioning cardiology follow-up from Dr. Burnard to myself.  He has CCS functional class II exertional angina pectoris while on treatment with long-acting nitrates, ranolazine , amlodipine .  He essentially only has angina when he tries to climb hills, but is able to walk in his neighborhood on level ground without angina pectoris (roughly 25-minute mile rate).  He denies shortness of breath with activity or at rest.  Does not have orthopnea, PND or lower extremity edema.  He does have numbness in his feet especially at the tips of his toes and wonders whether this could be from poor blood flow.  He denies palpitations, dizziness or syncope.  His beta-blockers were discontinued when he had complete heart block in 2022.  They were not restarted when he received his pacemaker.  He is on chronic anticoagulation with Eliquis  and has not had any falls or bleeding problems.  He underwent a PET scan just before this appointment.  This was an abnormal study interpreted as showing intermediate risk to the presence of moderate ischemia in the mid-basal anterolateral inferolateral walls and diminished global myocardial blood flow at 1.66.  LVEF was normal (52% at rest, 58% with stress).  Incidental note was made of several small calcified gallstones.  His most recent pacemaker download from 05/17/2024 shows a normal functioning device with 59% atrial pacing and  only 7% ventricular pacing.  He has not had any episodes of high ventricular rate or mode switch.  Estimated battery status is 85%.   Studies Reviewed EKG Interpretation Date/Time:  Tuesday July 18 2024 10:33:00 EDT Ventricular Rate:  68 PR Interval:  196 QRS Duration:  148 QT Interval:  428 QTC Calculation: 455 R Axis:   46  Text Interpretation: Normal sinus rhythm Right bundle branch block When compared with ECG of 08-Jun-2024 08:25, Sinus rhythm has replaced Electronic atrial pacemaker Confirmed by Patrick [name] [phone]) on 07/18/2024 10:40:01 AM     Risk Assessment/Calculations  CHA2DS2-VASc Score = 4   This indicates a 4.8% annual risk of stroke. The patient's score is based upon: CHF History: 0 HTN History: 1 Diabetes History: 1 Stroke History: 0 Vascular Disease History: 1 Age Score: 1 Gender Score: 0            Physical Exam VS:  BP 120/62   Pulse 70   Ht 5' 11 (1.803 m)   Wt 210 lb (95.3 kg)   SpO2 98%   BMI 29.29 kg/m        Wt Readings from Last 3 Encounters:  07/18/24 210 lb (95.3 kg)  07/04/24 204 lb (92.5 kg)  06/30/24 204 lb (92.5 kg)    GEN: Well nourished, well developed in no acute distress NECK: No JVD; No carotid bruits CARDIAC: RRR, no murmurs, rubs, gallops.  Healthy left subclavian pacemaker site. RESPIRATORY:  Clear to auscultation without rales, wheezing or rhonchi  ABDOMEN: Soft, non-tender, non-distended EXTREMITIES:  No edema; No deformity .  Diminished pulses in both feet but present bilaterally  ASSESSMENT AND PLAN CAD: CCS functional class II exertional angina, only with greater than usual activity.  He is on 3 antianginal agents but is not on a beta-blocker.  This was stopped when he had conduction abnormalities but he now has a pacemaker and has very infrequent ventricular pacing.  Will start metoprolol  tartrate 25 mg twice daily.  Reevaluate in approximately a month.  If he continues to have angina pectoris or if he has side  effects from his current antianginal regimen we will recommend coronary angiography and appropriate revascularization.  His risk factors are all appropriately addressed.  Currently not on aspirin  due to full anticoagulation, but if he does require PCI-stent prefer to use clopidogrel  to reduce bleeding risk. CHB: Despite need for pacemaker implantation, current ventricular pacing is only 7% of the time.  AV delay is normal under 200 ms and he has a right bundle branch block.  Hopefully ventricular pacing will not increase significantly with beta-blockers. PAFib: None recorded recently by his device.  On appropriate anticoagulation with Eliquis . Pacemaker: Normal device function, monitored by Dr. Burnard.  Programmed DDD-CLS. DM2: Excellent glycemic control with recent hemoglobin A1c 5.9% on GLP-1 agonist and metformin.  Has normal renal function but probably has diabetic neuropathy. HLP: Excellent lipid profile on low-dose rosuvastatin .  LDL well within target range at 34. HTN: Excellent blood pressure control.  We are adding beta-blockers and may have to slightly reduce his other medications such as the dose of losartan  hydrochlorothiazide if he develops hypotension. Aortic atherosclerosis: Incidentally noted on imaging studies.  Needs to evaluate for more extensive PAD of the lower extremities. PAD: Pulses are diminished in his feet.  Will check ABI and if necessary a full lower extremity arterial Doppler.  The distribution of his neuropathy is more consistent with diabetes than ischemic neuropathy though. RA: He is on methotrexate and hydroxychloroquine monitored by Dr. Lady [name] with [facility].       Dispo: Start metoprolol  25 mg twice daily.  Check ABI bilaterally.  Follow-up in 1 month.  Signed, Patrick Balding, MD

## 2024-07-28 ENCOUNTER — Ambulatory Visit: Payer: Self-pay | Admitting: Cardiovascular Disease

## 2024-07-28 ENCOUNTER — Ambulatory Visit (HOSPITAL_COMMUNITY)
Admission: RE | Admit: 2024-07-28 | Discharge: 2024-07-28 | Disposition: A | Discharge status: Home / Self Care | Source: Ambulatory Visit | Attending: Cardiovascular Disease | Admitting: Cardiovascular Disease

## 2024-07-28 DIAGNOSIS — R0989 Other specified symptoms and signs involving the circulatory and respiratory systems: Secondary | ICD-10-CM | POA: Insufficient documentation

## 2024-07-28 LAB — VAS US ABI WITH/WO TBI
Left ABI: 1.21
Right ABI: 1.28

## 2024-08-02 ENCOUNTER — Ambulatory Visit: Payer: Medicare Other | Admitting: Neurology

## 2024-08-02 ENCOUNTER — Encounter: Payer: Self-pay | Admitting: Neurology

## 2024-08-02 VITALS — BP 110/67 | HR 65 | Ht 71.0 in | Wt 203.0 lb

## 2024-08-02 DIAGNOSIS — G629 Polyneuropathy, unspecified: Secondary | ICD-10-CM

## 2024-08-02 NOTE — Patient Instructions (Signed)
 Continue current medications Continue to follow with your doctors Return as needed.

## 2024-08-02 NOTE — Progress Notes (Signed)
 [facility]  PATIENT: Patrick [name] DOB: January 28, 1956  REFERRING CLINICIAN: [name] ORN, MD HISTORY FROM: Patient  REASON FOR VISIT: Brain fog/Neuropathy   HISTORICAL  CHIEF COMPLAINT:  Chief Complaint  Patient presents with   Peripheral Neuropathy    RM12, alone,  Peripheral polyneuropathy:pt stated that they burn/tingly bilateral feet, ongoing 2 years, progressivey worsening    Memory Loss    RM12, alone, MEMORY LOSS: moca score is 29    INTERVAL HISTORY 08/02/2024 Patient presents today for follow-up, last visit was a year ago, since then he has been doing well.  He has not had any syncopal episode or falls. When it comes to his peripheral neuropathy, he does complain of some numbness and tingling, describing as feet falling asleep.  He denies any plan.  Tells me that he was recently diagnosed with rheumatoid arthritis and osteoarthritis.   INTERVAL HISTORY 08/02/2023:  Patient presents today for follow-up, last visit was a year ago.  Since then, he has been doing well, reports decrease in the brain fog.  He reported 4 weeks ago he was diagnosed with COVID but was able to go back to work within the week. Now his main complaint is pain in the bilateral toes and tingling at bottom of feet that he feels are related to peripheral neuropathy.  This pain started about a year ago and described as aching burning type pain.  During this time, he was also diagnosed with both osteoarthritis and rheumatoid arthritis.  He is being treated for that.  Patient has a history of diabetes for more than 10 years.  Currently he is taking supplements including alpha lipoic acid, L-Carnitine and some plant-based collagen booster on top of his prescribed medication.  He is also doing acupuncture.   INTERVAL HISTORY 07/30/2022:  Patient presents today for follow up. Since last visit, he reports doing well in terms of brain fog and dizziness but in early May he had another episode  of dizziness and his watch showing a heart rate of 30. 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 Since the placement of the pacemaker, he reports worsening brain fog, difficulty concentrating and word finding diffuclty. 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    INTERVAL HISTORY 01/14/2022:  Patient presented today for follow-up, since last visit in October he has been taking meclizine .  Initially he reported the meclizine  helped but later was not helpful, he increase it to 25 mg up to 3 times daily but no relief.  He described the dizziness as difficulty with focusing, difficulty with concentration, sick state that it feels like there is cotton instead of brain matter and there is impairment of cognitive ability, likely brain fog.  Actually he denies any trouble with balance he he is not unsteady.  Denies any falls.  Stated last year in March she was diagnosed with COVID, mild infection.  Denies any recent TBI. He has not followed up with ENT.   HISTORY OF PRESENT ILLNESS:  This is a 68 year old gentleman with past medical history of hypertension, hyperlipidemia, CKD, diabetes, previous heart attack who is presenting with complaint of recurrent syncope and dizziness.  Patient said the first episode started in April 2022 when he has a episode of dizziness, feeling faint and fuzzy feeling in his head.  He went to the ED was observed and was told it was related to dehydration and discharged  home.  Patient had another event on June 17 where he fell and hit his head.  On July 25 he had another event, fell hit his head, was taken to the ED via ambulance and in the ED was noted to have a heart rate of 30 bpm.  He was given the diagnosis of heart block and had a temporary pacemaker.  He was admitted to the cardiology service and his metoprolol  was discontinued and patient have a heart monitor placed.  After  discontinuation of the metoprolol  his heart rate is normalized.  He was told that the bradycardia was secondary to his metoprolol .  Since being discharged from the hospital he has additional episodes of dizziness but no fainting. He describes the dizziness as lightheaded and fuzzy feeling in his head.   He did follow-up with cardiology again and was told that his recurrent dizziness did not sound cardiac in nature given normal vitals and his EKG strip on his watch was normal.  He was recommended to follow-up with neurology and ENT.   He does report hearing loss and tinnitus but said this is chronic.  OTHER MEDICAL CONDITIONS: HTN, Heart attack, DM, HLD   REVIEW OF SYSTEMS: Full 14 system review of systems performed and negative with exception of: as noted   ALLERGIES: No Known Allergies  HOME MEDICATIONS: Outpatient Medications Prior to Visit  Medication Sig Dispense Refill   Acetylcarnitine HCl (ACETYL L-CARNITINE PO) Take 1,500 mg by mouth daily.     ALPHA LIPOIC ACID PO Take 600 mg by mouth daily.     amLODipine  (NORVASC ) 5 MG tablet Take 5 mg by mouth daily.     amoxicillin -clavulanate (AUGMENTIN ) 875-125 MG tablet Take 1 tablet by mouth 2 (two) times daily. 14 tablet 0   apixaban  (ELIQUIS ) 5 MG TABS tablet Take 1 tablet (5 mg total) by mouth 2 (two) times daily. 180 tablet 3   cholecalciferol (VITAMIN D3) 25 MCG (1000 UNIT) tablet Take 1,000 Units by mouth daily.     Coenzyme Q10 200 MG capsule Take 200 mg by mouth daily.     Cyanocobalamin (VITAMIN B-12 PO) Take by mouth.     folic acid (FOLVITE) 1 MG tablet Take 1 mg by mouth daily.     Glucosamine-Chondroit-Vit C-Mn (GLUCOSAMINE 1500 COMPLEX PO) Take 1,500 mg by mouth daily.     hydroxychloroquine (PLAQUENIL) 200 MG tablet Take 1 tablet by mouth 2 (two) times daily.     isosorbide  mononitrate (IMDUR ) 60 MG 24 hr tablet Take 1 tablet (60 mg total) by mouth daily. 90 tablet 3   losartan -hydrochlorothiazide (HYZAAR) 100-12.5 MG tablet  Take 1 tablet by mouth daily.     metFORMIN (GLUCOPHAGE-XR) 500 MG 24 hr tablet Take 1,000 mg by mouth 2 (two) times daily with a meal. (Patient taking differently: Take 1,000 mg by mouth 3 (three) times daily. 1 tab in am and 2 pm)     methotrexate (RHEUMATREX) 2.5 MG tablet Take 2.5 mg by mouth as directed. Take 1 tablet for 5 days out of 7, weekly     metoprolol  tartrate (LOPRESSOR ) 25 MG tablet Take 1 tablet (25 mg total) by mouth 2 (two) times daily. 180 tablet 1   Multiple Vitamins-Minerals (ABC PLUS SENIOR PO) Take 1 tablet by mouth daily.     nitroGLYCERIN  (NITROSTAT ) 0.4 MG SL tablet DISSOLVE ONE TABLET UNDER TONGUE AS NEEDED FOR CHEST PAIN EVERY 5 MINUTES FOR 3 DOSES 75 tablet 3   nystatin cream (MYCOSTATIN) Apply 1 application. topically  daily as needed for dry skin.     Omega-3 Fatty Acids (FISH OIL OMEGA-3 PO) Take 2 capsules by mouth 2 (two) times daily.      pantoprazole  (PROTONIX ) 40 MG tablet Take 40 mg by mouth daily.     Potassium 99 MG TABS Take 99 mg by mouth daily.     ranolazine  (RANEXA ) 1000 MG SR tablet TAKE 1 TABLET(1000 MG) BY MOUTH TWICE DAILY 180 tablet 3   rosuvastatin  (CRESTOR ) 5 MG tablet Take 5 mg by mouth daily.     sulfamethoxazole -trimethoprim  (BACTRIM  DS) 800-160 MG tablet Take 1 tablet by mouth every 12 (twelve) hours. 28 tablet 0   tamsulosin  (FLOMAX ) 0.4 MG CAPS capsule Take 2 capsules (0.8 mg total) by mouth daily. 180 capsule 3   triamcinolone cream (KENALOG) 0.1 % Apply 1 application. topically daily as needed (rash).     TRULICITY 1.5 MG/0.5ML SOAJ Inject 1.5 mg into the skin once a week.     vitamin C (ASCORBIC ACID) 500 MG tablet Take 500 mg by mouth daily.     vitamin E 180 MG (400 UNITS) capsule Take 400 Units by mouth daily.     No facility-administered medications prior to visit.    PAST MEDICAL HISTORY: Past Medical History:  Diagnosis Date   CAD in native artery    a. anterolateral STEMI 07/2017 s/p DES to prox-mid LAD, EF 55%.   GERD  (gastroesophageal reflux disease)    History of kidney stones    Hyperlipidemia    Hypertension    Myocardial infarction [facility])    Overactive bladder    Presence of permanent cardiac pacemaker    Renal calculi    Rheumatoid arthritis (HCC)    Syncope    Uncontrolled diabetes mellitus     PAST SURGICAL HISTORY: Past Surgical History:  Procedure Laterality Date   CORONARY/GRAFT ACUTE MI REVASCULARIZATION N/A 08/04/2017   Procedure: Coronary/Graft Acute MI Revascularization;  Surgeon: Burnard Debby LABOR, MD;  Location: [facility] INVASIVE CV LAB;  Service: Cardiovascular;  Laterality: N/A;   LEFT HEART CATH AND CORONARY ANGIOGRAPHY N/A 08/04/2017   Procedure: LEFT HEART CATH AND CORONARY ANGIOGRAPHY;  Surgeon: Burnard Debby LABOR, MD;  Location: [facility] INVASIVE CV LAB;  Service: Cardiovascular;  Laterality: N/A;   LEFT HEART CATH AND CORONARY ANGIOGRAPHY N/A 03/21/2018   Procedure: LEFT HEART CATH AND CORONARY ANGIOGRAPHY;  Surgeon: Burnard Debby LABOR, MD;  Location: [facility] INVASIVE CV LAB;  Service: Cardiovascular;  Laterality: N/A;   PACEMAKER IMPLANT N/A 05/17/2022   Procedure: PACEMAKER IMPLANT;  Surgeon: [name] Danelle ORN, MD;  Location: [facility] INVASIVE CV LAB;  Service: Cardiovascular;  Laterality: N/A;   PROSTATE SURGERY     in office Dr Kassie   TEMPORARY PACEMAKER N/A 07/14/2021   Procedure: TEMPORARY PACEMAKER;  Surgeon: Anner Alm ORN, MD;  Location: [facility] INVASIVE CV LAB;  Service: Cardiovascular;  Laterality: N/A;   TONSILLECTOMY      FAMILY HISTORY: Family History  Problem Relation Age of Onset   Dementia Mother    Heart disease Father    Heart attack Father    Alcohol abuse Brother    Stroke Daughter     SOCIAL HISTORY: Social History   Socioeconomic History   Marital status: Married    Spouse name: Not on file   Number of children: 3   Years of education: GED   Highest education level: Not on file  Occupational History   Occupation: Editor, commissioning  Tobacco Use   Smoking status: Former  Types: Cigarettes   Smokeless tobacco: Never   Tobacco comments:    Quit smoking at age 35.  Vaping Use   Vaping status: Never Used  Substance and Sexual Activity   Alcohol use: Yes    Alcohol/week: 2.0 standard drinks of alcohol    Types: 2 Shots of liquor per week   Drug use: No   Sexual activity: Not on file  Other Topics Concern   Not on file  Social History Narrative   Lives at home with his wife.   Right-handed.   Caffeine use: 3.5 cups per day.   Social Drivers of Corporate investment banker Strain: Low Risk  (01/19/2024)   [facility]   Overall Financial Resource Strain (CARDIA)    Difficulty of Paying Living Expenses: Not hard at all  Food Insecurity: No Food Insecurity (01/19/2024)   Received from [facility]   Hunger Vital Sign    Within the past 12 months, you worried that your food would run out before you got the money to buy more.: Never true    Within the past 12 months, the food you bought just didn't last and you didn't have money to get more.: Never true  Transportation Needs: No Transportation Needs (01/19/2024)   Received from [facility] - Transportation    In the past 12 months, has lack of transportation kept you from medical appointments or from getting medications?: No    Lack of Transportation (Non-Medical): No  Physical Activity: Not on file  Stress: Not on file  Social Connections: Not on file  Intimate Partner Violence: Not on file    PHYSICAL EXAM  GENERAL EXAM/CONSTITUTIONAL: Vitals:  Vitals:   08/02/24 1138  BP: 110/67  Pulse: 65  Weight: 203 lb (92.1 kg)  Height: 5' 11 (1.803 m)     Body mass index is 28.31 kg/m. Wt Readings from Last 3 Encounters:  08/02/24 203 lb (92.1 kg)  07/18/24 210 lb (95.3 kg)  07/04/24 204 lb (92.5 kg)   Patient is in no distress; well developed, nourished and groomed; neck is supple  MUSCULOSKELETAL: Gait,  strength, tone, movements noted in Neurologic exam below  NEUROLOGIC: MENTAL STATUS:      No data to display            08/02/2024   11:46 AM 08/02/2023   11:15 AM 07/30/2022   10:37 AM  Montreal Cognitive Assessment   Visuospatial/ Executive (0/5) 5 5 5   Naming (0/3) 3 3 3   Attention: Read list of digits (0/2) 2 2 2   Attention: Read list of letters (0/1) 1 1 1   Attention: Serial 7 subtraction starting at 100 (0/3) 3 3 3   Language: Repeat phrase (0/2) 2 2 2   Language : Fluency (0/1) 1 0 0  Abstraction (0/2) 2 2 2   Delayed Recall (0/5) 4 3 4   Orientation (0/6) 6 6 5   Total 29 27 27   Adjusted Score (based on education)   27    CRANIAL NERVE: 2nd, 3rd, 4th, 6th -visual fields full to confrontation, extraocular muscles intact, no nystagmus 5th - facial sensation symmetric 7th - facial strength symmetric 8th - hearing intact 9th - palate elevates symmetrically, uvula midline 11th - shoulder shrug symmetric 12th - tongue protrusion midline  MOTOR:  normal bulk and tone, full strength in the BUE, BLE  SENSORY:  normal and symmetric to light touch, decrease pinprick sole of feet but normal vibration  COORDINATION:  finger-nose-finger, fine finger movements normal  GAIT/STATION:  normal   DIAGNOSTIC DATA (LABS, IMAGING, TESTING) - I reviewed patient records, labs, notes, testing and imaging myself where available.  Lab Results  Component Value Date   WBC 4.6 07/12/2024   HGB 13.0 07/12/2024   HCT 39.0 07/12/2024   MCV 101 (H) 07/12/2024   PLT 177 07/12/2024      Component Value Date/Time   NA 142 07/12/2024 1002   NA 140 05/18/2013 0959   K 4.6 07/12/2024 1002   K 4.0 05/18/2013 0959   CL 102 07/12/2024 1002   CL 104 05/18/2013 0959   CO2 23 07/12/2024 1002   CO2 29 05/18/2013 0959   GLUCOSE 105 (H) 07/12/2024 1002   GLUCOSE 104 (H) 05/16/2022 0324   GLUCOSE 142 (H) 05/18/2013 0959   BUN 22 07/12/2024 1002   BUN 27 (H) 05/18/2013 0959   CREATININE  1.20 07/12/2024 1002   CREATININE 1.03 05/18/2013 0959   CALCIUM  9.0 07/12/2024 1002   CALCIUM  9.3 05/18/2013 0959   PROT 6.3 (L) 05/15/2022 1601   ALBUMIN 3.9 05/15/2022 1601   AST 40 05/15/2022 1601   ALT 40 05/15/2022 1601   ALKPHOS 35 (L) 05/15/2022 1601   BILITOT 1.0 05/15/2022 1601   GFRNONAA 60 (L) 05/16/2022 0324   GFRNONAA >60 05/18/2013 0959   GFRAA 82 03/16/2018 1155   GFRAA >60 05/18/2013 0959   Lab Results  Component Value Date   CHOL 79 07/14/2021   HDL 27 (L) 07/14/2021   LDLCALC 34 07/14/2021   TRIG 89 07/14/2021   CHOLHDL 2.9 07/14/2021   Lab Results  Component Value Date   HGBA1C 5.9 (H) 07/14/2021   No results found for: VITAMINB12 Lab Results  Component Value Date   TSH 0.732 05/15/2022   Last HA1C 6.1 04/26/2023  Head CT 06/06/21 1. No acute intracranial abnormality. 2. Stable age related atrophy and minor chronic small vessel ischemia.   Cervical spine CT 06/06/2021 1. No acute fracture or subluxation of the cervical spine. 2. 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   EEG 08/06/2022 Normal    ASSESSMENT AND PLAN  68 y.o. year old male with past medical history of previous heart attack in 2018, hypertension, hyperlipidemia, CKD who is presenting for follow up.  Overall he is doing very well, denies any brain fog, his peripheral neuropathy symptoms are stable. He denies any pain.  On exam he has decreased pinprick to the sole of his feet but normal light touch and normal vibration.  His cardiologist also confirmed a normal ABI, normal circulation. He tells me that he was recently diagnosed with osteoarthritis and rheumatoid arthritis.  I have informed patient that his his feet symptoms might be a combination of rheumatoid arthritis and possibly peripheral neuropathy as his vibratory test was normal.  For now no indication to start medication.  Advised him to continue following up with his doctors, continue exercise and return as  needed.    1. Peripheral polyneuropathy      Patient Instructions  Continue current medications Continue to follow with your doctors Return as needed.   No orders of the defined types were placed in this encounter.    No orders of the defined types were placed in this encounter.   Return if symptoms worsen or fail to improve.   Pastor Falling, MD 08/02/2024, 1:59 PM  [facility] [address] [place], [place] [address] [phone]

## 2024-08-03 NOTE — Telephone Encounter (Signed)
 Wean metoprolol  by taking half tab twice a day for one day before stopping it completely.

## 2024-08-16 ENCOUNTER — Ambulatory Visit (INDEPENDENT_AMBULATORY_CARE_PROVIDER_SITE_OTHER): Payer: Medicare Other

## 2024-08-16 DIAGNOSIS — I442 Atrioventricular block, complete: Secondary | ICD-10-CM | POA: Diagnosis not present

## 2024-08-16 LAB — CUP PACEART REMOTE DEVICE CHECK
Date Time Interrogation Session: 20250827105725
Implantable Lead Connection Status: 753985
Implantable Lead Connection Status: 753985
Implantable Lead Implant Date: 20230528
Implantable Lead Implant Date: 20230528
Implantable Lead Location: 753859
Implantable Lead Location: 753860
Implantable Lead Model: 377169
Implantable Lead Model: 377171
Implantable Lead Serial Number: 8000368134
Implantable Lead Serial Number: 8000749513
Implantable Pulse Generator Implant Date: 20230528
Pulse Gen Model: 407145
Pulse Gen Serial Number: 70380012

## 2024-08-17 ENCOUNTER — Ambulatory Visit: Payer: Self-pay | Admitting: Internal Medicine

## 2024-08-31 ENCOUNTER — Ambulatory Visit: Attending: Cardiovascular Disease | Admitting: Cardiovascular Disease

## 2024-08-31 ENCOUNTER — Encounter: Payer: Self-pay | Admitting: Cardiovascular Disease

## 2024-08-31 VITALS — BP 132/64 | HR 67 | Ht 71.0 in | Wt 211.3 lb

## 2024-08-31 DIAGNOSIS — I10 Essential (primary) hypertension: Secondary | ICD-10-CM | POA: Diagnosis present

## 2024-08-31 DIAGNOSIS — I480 Paroxysmal atrial fibrillation: Secondary | ICD-10-CM | POA: Insufficient documentation

## 2024-08-31 DIAGNOSIS — Z950 Presence of cardiac pacemaker: Secondary | ICD-10-CM | POA: Insufficient documentation

## 2024-08-31 DIAGNOSIS — M069 Rheumatoid arthritis, unspecified: Secondary | ICD-10-CM | POA: Diagnosis present

## 2024-08-31 DIAGNOSIS — Z01812 Encounter for preprocedural laboratory examination: Secondary | ICD-10-CM | POA: Insufficient documentation

## 2024-08-31 DIAGNOSIS — E785 Hyperlipidemia, unspecified: Secondary | ICD-10-CM | POA: Insufficient documentation

## 2024-08-31 DIAGNOSIS — I700 Atherosclerosis of aorta: Secondary | ICD-10-CM | POA: Diagnosis present

## 2024-08-31 DIAGNOSIS — E118 Type 2 diabetes mellitus with unspecified complications: Secondary | ICD-10-CM | POA: Diagnosis present

## 2024-08-31 DIAGNOSIS — I25118 Atherosclerotic heart disease of native coronary artery with other forms of angina pectoris: Secondary | ICD-10-CM | POA: Insufficient documentation

## 2024-08-31 DIAGNOSIS — I442 Atrioventricular block, complete: Secondary | ICD-10-CM | POA: Insufficient documentation

## 2024-08-31 NOTE — H&P (View-Only) (Signed)
 Cardiology Office Note   Date:  08/31/2024  ID:  Patrick [name], DOB July 08, 1956, MRN [id number] PCP: [name] ORN, MD  [facility] Providers Cardiologist:  [name], MD     History of Present Illness Patrick [name] is a 68 y.o. male with a history of CAD (STEMI with PCI-LAD 2018, patent stent by repeat catheterization 2019 for false positive SPECT), paroxysmal atrial fibrillation, paroxysmal complete heart block s/p dual-chamber permanent pacemaker (Biotronik [place], Dr. [name], 2023), DM2, hyperlipidemia, HTN, rheumatoid arthritis transitioning cardiology follow-up from Dr. Burnard to myself.  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  As before this generally happens only when he is trying to climb hills.  He was recently on vacation last [place] and since he did not have to climb stairs or rails he did not have any symptoms of chest discomfort.  We tried adding metoprolol  but even at a very low dose this caused dizziness and fatigue.  He denies shortness of breath with activity or at rest.  Does not have orthopnea, PND or lower extremity edema.  He denies palpitations, dizziness or syncope.  He has chronic neuropathic sounding symptoms in both feet, had normal ABI 07/28/2024.  He is on chronic anticoagulation with Eliquis  and has not had any falls or bleeding problems.  He underwent a PET scan just before this appointment.  This was an abnormal study interpreted as showing intermediate risk to the presence of moderate ischemia in the mid-basal anterolateral inferolateral walls and diminished global myocardial blood flow at 1.66.  LVEF was normal (52% at rest, 58% with stress).  Incidental note was made of several small calcified gallstones.  Presence of interrogation of his pacemaker today shows normal device function.  He has 58% atrial pacing and only 10% ventricular pacing despite  the diagnosis of complete heart block.  Estimated generator longevity 7.5 years.  He has not had any recent episodes of atrial fibrillation.  Lead parameters are normal.  Heart rate histogram distribution is appropriate.  Studies Reviewed    PET scan 07/13/2024     LV perfusion is abnormal. There is evidence of ischemia. Defect 1: There is a medium defect with moderate reduction in uptake present in the mid to basal anterolateral and inferolateral location(s) that is reversible. There is abnormal wall motion in the defect area. Consistent with ischemia.   Rest left ventricular function is normal. Rest EF: 52%. Stress left ventricular function is normal. Stress EF: 58%. End diastolic cavity size is normal. End systolic cavity size is normal.   Myocardial blood flow was computed to be 0.73ml/g/min at rest and 1.63ml/g/min at stress. Global myocardial blood flow reserve was 1.66 and was abnormal.   Coronary calcium  assessment not performed due to prior revascularization.   Findings are consistent with ischemia. The study is intermediate risk.  ECG: Personally reviewed the tracing from 07/18/2024 showing normal sinus rhythm and right bundle branch block  EKG Interpretation Date/Time:    Ventricular Rate:    PR Interval:    QRS Duration:    QT Interval:    QTC Calculation:   R Axis:      Text Interpretation:          Risk Assessment/Calculations  CHA2DS2-VASc Score = 4   This indicates a 4.8% annual risk of stroke. The patient's score is based upon: CHF History: 0 HTN History: 1 Diabetes History: 1 Stroke History: 0 Vascular Disease  History: 1 Age Score: 1 Gender Score: 0            Physical Exam VS:  BP 132/64 (BP Location: Left Arm, Patient Position: Sitting, Cuff Size: Normal)   Pulse 67   Ht 5' 11 (1.803 m)   Wt 211 lb 4.8 oz (95.8 kg)   SpO2 97%   BMI 29.47 kg/m        Wt Readings from Last 3 Encounters:  08/31/24 211 lb 4.8 oz (95.8 kg)  08/02/24 203 lb (92.1  kg)  07/18/24 210 lb (95.3 kg)     General: Alert, oriented x3, no distress, overweight. Head: no evidence of trauma, PERRL, EOMI, no exophtalmos or lid lag, no myxedema, no xanthelasma; normal ears, nose and oropharynx Neck: normal jugular venous pulsations and no hepatojugular reflux; brisk carotid pulses without delay and no carotid bruits Chest: clear to auscultation, no signs of consolidation by percussion or palpation, normal fremitus, symmetrical and full respiratory excursions Cardiovascular: normal position and quality of the apical impulse, regular rhythm, normal first and second heart sounds, no murmurs, rubs or gallops Abdomen: no tenderness or distention, no masses by palpation, no abnormal pulsatility or arterial bruits, normal bowel sounds, no hepatosplenomegaly Extremities: no clubbing, cyanosis or edema; 2+ radial, ulnar and brachial pulses bilaterally; 2+ right femoral, posterior tibial and dorsalis pedis pulses; 2+ left femoral, posterior tibial and dorsalis pedis pulses; no subclavian or femoral bruits Neurological: grossly nonfocal Psych: Normal mood and affect   ASSESSMENT AND PLAN CAD: He continues to have exertional angina CCS functional class II despite taking 3 antianginal medications.  Unfortunately he does not tolerate beta-blockers, even though he has a pacemaker.  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  His risk factors are all appropriately addressed.  Currently not on aspirin  due to full anticoagulation, but if he does require PCI-stent prefer to use clopidogrel  rather than ticagrelor , to reduce bleeding risk. CHB: Despite the fact that he needed a pacemaker, he only requires ventricular pacing 7% of the time. PAFib: None has occurred this year.  On Eliquis , which she will hold for a couple of days before the cardiac  catheterization. Pacemaker: Normal device function, monitored by Dr. [name].  Programmed DDD-CLS. DM2: Hold metformin for his cardiac catheterization.  Excellent glycemic control with recent hemoglobin A1c 5.9% on GLP-1 agonist and metformin.  Has normal renal function but probably has diabetic neuropathy. HLP: Excellent lipid profile on low-dose rosuvastatin .  LDL well within target range at 34. HTN: Hold losartan -hydrochlorothiazide on the day of cardiac catheterization.  Good blood pressure control on current medications.   Aortic atherosclerosis: Incidentally noted on imaging studies.  Needs to evaluate for more extensive PAD of the lower extremities.  Normal ABI bilaterally 07/28/2024.  His foot symptoms sound neuropathic rather than ischemic. RA: He is on methotrexate and hydroxychloroquine monitored by Dr. Lady Blanch with [facility].  Watch for signs of excessive QT prolongation, QTc was 455 ms on ECG 6 weeks ago.     Informed Consent   Shared Decision Making/Informed Consent The risks [stroke (1 in 1000), death (1 in 1000), kidney failure [usually temporary] (1 in 500), bleeding (1 in 200), allergic reaction [possibly serious] (1 in 200)], benefits (diagnostic support and management of coronary artery disease) and alternatives of a cardiac catheterization were discussed in detail with Mr. Gulas and he is willing to proceed.  Patient Instructions  Medication Instructions:  Take Aspirin  81 mg on the morning of Catheterization *If you need a refill on your cardiac medications before your next appointment, please call your pharmacy*  Lab Work: CBC, BMP If you have labs (blood work) drawn today and your tests are completely normal, you will receive your results only by: MyChart Message (if you have MyChart) OR A paper copy in the mail If you have any lab test that is abnormal or we need to change your treatment, we will call you to review the results.  Testing/Procedures:  [facility] A DEPT OF [facility] [address] A DEPT OF THE [facility] [address] [place] [address] Dept: [phone] Loc: [phone]  TYQUEZ HOLLIBAUGH  08/31/2024  You are scheduled for a Cardiac Catheterization on Thursday, September 25 with Dr. [name].  1. Please arrive at the [facility] (Main Entrance A) at [facility]: [address] [place], [place] [address] at 7:00 AM (This time is 2 hour(s) before your procedure to ensure your preparation).   Free valet parking service is available. You will check in at ADMITTING. The support person will be asked to wait in the waiting room.  It is OK to have someone drop you off and come back when you are ready to be discharged.    Special note: Every effort is made to have your procedure done on time. Please understand that emergencies sometimes delay scheduled procedures.   2. Diet: Nothing to eat after midnight  3. Hydration: You need to be well hydrated before your procedure. On September 25, you may drink approved liquids (see below) until 2 hours before the procedure, with 16 oz of water as your last intake.   List of approved liquids water, clear juice, clear tea, black coffee, fruit juices, non-citric and without pulp, carbonated beverages, Gatorade, Kool -Aid, plain Jello-O and plain ice popsicles.  4. Labs: CBC AND BMP today  5. Medication instructions in preparation for your procedure:   Contrast Allergy: No   Stop taking Eliquis  (Apixiban) on Monday, September 22. Last dose will be evening dose  Do not take Losartan  hydrochlorothiazide on day of procedure   Do not take Diabetes Med Glucophage (Metformin) on the day of the procedure and HOLD 48 HOURS AFTER THE PROCEDURE.  Hold Trulicity 7 days prior to the procedure: Last dose will be Wednesday September 17th    On the morning of your procedure, take your Aspirin  81 mg and any morning medicines NOT  listed above.  You may use sips of water.  6. Plan to go home the same day, you will only stay overnight if medically necessary. 7. Bring a current list of your medications and current insurance cards. 8. You MUST have a responsible person to drive you home. 9. Someone MUST be with you the first 24 hours after you arrive home or your discharge will be delayed. 10. Please wear clothes that are easy to get on and off and wear slip-on shoes.  Thank you for allowing us  to care for you!   --  Invasive Cardiovascular services   Follow-Up: At [facility], you and your health needs are our priority.  As part of our continuing mission to provide you with exceptional heart care, our providers are all part of one team.  This team includes your primary Cardiologist (physician) and Advanced Practice Providers or APPs (Physician Assistants  and Nurse Practitioners) who all work together to provide you with the care you need, when you need it.  Your next appointment:    Will be scheduled after procedure  Provider:   [name], MD    We recommend signing up for the patient portal called MyChart.  Sign up information is provided on this After Visit Summary.  MyChart is used to connect with patients for Virtual Visits (Telemedicine).  Patients are able to view lab/test results, encounter notes, upcoming appointments, etc.  Non-urgent messages can be sent to your provider as well.   To learn more about what you can do with MyChart, go to [email or web address].        Signed, [name], MD

## 2024-08-31 NOTE — Progress Notes (Signed)
 Cardiology Office Note   Date:  08/31/2024  ID:  Patrick [name], DOB July 08, 1956, MRN [id number] PCP: [name] ORN, MD  [facility] Providers Cardiologist:  [name], MD     History of Present Illness Patrick [name] is a 69 y.o. male with a history of CAD (STEMI with PCI-LAD 2018, patent stent by repeat catheterization 2019 for false positive SPECT), paroxysmal atrial fibrillation, paroxysmal complete heart block s/p dual-chamber permanent pacemaker (Biotron[place], Dr. [name], 2023), DM2, hyperlipidemia, HTN, rheumatoid arthritis transitioning cardiology follow-up from Dr. Burnard to myself.  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  As before this generally happens only when he is trying to climb hills.  He was recently on vacation last [place] and since he did not have to climb stairs or rails he did not have any symptoms of chest discomfort.  We tried adding metoprolol  but even at a very low dose this caused dizziness and fatigue.  He denies shortness of breath with activity or at rest.  Does not have orthopnea, PND or lower extremity edema.  He denies palpitations, dizziness or syncope.  He has chronic neuropathic sounding symptoms in both feet, had normal ABI 07/28/2024.  He is on chronic anticoagulation with Eliquis  and has not had any falls or bleeding problems.  He underwent a PET scan just before this appointment.  This was an abnormal study interpreted as showing intermediate risk to the presence of moderate ischemia in the mid-basal anterolateral inferolateral walls and diminished global myocardial blood flow at 1.66.  LVEF was normal (52% at rest, 58% with stress).  Incidental note was made of several small calcified gallstones.  Presence of interrogation of his pacemaker today shows normal device function.  He has 58% atrial pacing and only 10% ventricular pacing despite  the diagnosis of complete heart block.  Estimated generator longevity 7.5 years.  He has not had any recent episodes of atrial fibrillation.  Lead parameters are normal.  Heart rate histogram distribution is appropriate.  Studies Reviewed    PET scan 07/13/2024     LV perfusion is abnormal. There is evidence of ischemia. Defect 1: There is a medium defect with moderate reduction in uptake present in the mid to basal anterolateral and inferolateral location(s) that is reversible. There is abnormal wall motion in the defect area. Consistent with ischemia.   Rest left ventricular function is normal. Rest EF: 52%. Stress left ventricular function is normal. Stress EF: 58%. End diastolic cavity size is normal. End systolic cavity size is normal.   Myocardial blood flow was computed to be 0.73ml/g/min at rest and 1.63ml/g/min at stress. Global myocardial blood flow reserve was 1.66 and was abnormal.   Coronary calcium  assessment not performed due to prior revascularization.   Findings are consistent with ischemia. The study is intermediate risk.  ECG: Personally reviewed the tracing from 07/18/2024 showing normal sinus rhythm and right bundle branch block  EKG Interpretation Date/Time:    Ventricular Rate:    PR Interval:    QRS Duration:    QT Interval:    QTC Calculation:   R Axis:      Text Interpretation:          Risk Assessment/Calculations  CHA2DS2-VASc Score = 4   This indicates a 4.8% annual risk of stroke. The patient's score is based upon: CHF History: 0 HTN History: 1 Diabetes History: 1 Stroke History: 0 Vascular Disease  History: 1 Age Score: 1 Gender Score: 0            Physical Exam VS:  BP 132/64 (BP Location: Left Arm, Patient Position: Sitting, Cuff Size: Normal)   Pulse 67   Ht 5' 11 (1.803 m)   Wt 211 lb 4.8 oz (95.8 kg)   SpO2 97%   BMI 29.47 kg/m        Wt Readings from Last 3 Encounters:  08/31/24 211 lb 4.8 oz (95.8 kg)  08/02/24 203 lb (92.1  kg)  07/18/24 210 lb (95.3 kg)     General: Alert, oriented x3, no distress, overweight. Head: no evidence of trauma, PERRL, EOMI, no exophtalmos or lid lag, no myxedema, no xanthelasma; normal ears, nose and oropharynx Neck: normal jugular venous pulsations and no hepatojugular reflux; brisk carotid pulses without delay and no carotid bruits Chest: clear to auscultation, no signs of consolidation by percussion or palpation, normal fremitus, symmetrical and full respiratory excursions Cardiovascular: normal position and quality of the apical impulse, regular rhythm, normal first and second heart sounds, no murmurs, rubs or gallops Abdomen: no tenderness or distention, no masses by palpation, no abnormal pulsatility or arterial bruits, normal bowel sounds, no hepatosplenomegaly Extremities: no clubbing, cyanosis or edema; 2+ radial, ulnar and brachial pulses bilaterally; 2+ right femoral, posterior tibial and dorsalis pedis pulses; 2+ left femoral, posterior tibial and dorsalis pedis pulses; no subclavian or femoral bruits Neurological: grossly nonfocal Psych: Normal mood and affect   ASSESSMENT AND PLAN CAD: He continues to have exertional angina CCS functional class II despite taking 3 antianginal medications.  Unfortunately he does not tolerate beta-blockers, even though he has a pacemaker.  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  His risk factors are all appropriately addressed.  Currently not on aspirin  due to full anticoagulation, but if he does require PCI-stent prefer to use clopidogrel  rather than ticagrelor , to reduce bleeding risk. CHB: Despite the fact that he needed a pacemaker, he only requires ventricular pacing 7% of the time. PAFib: None has occurred this year.  On Eliquis , which she will hold for a couple of days before the cardiac  catheterization. Pacemaker: Normal device function, monitored by Dr. [name].  Programmed DDD-CLS. DM2: Hold metformin for his cardiac catheterization.  Excellent glycemic control with recent hemoglobin A1c 5.9% on GLP-1 agonist and metformin.  Has normal renal function but probably has diabetic neuropathy. HLP: Excellent lipid profile on low-dose rosuvastatin .  LDL well within target range at 34. HTN: Hold losartan -hydrochlorothiazide on the day of cardiac catheterization.  Good blood pressure control on current medications.   Aortic atherosclerosis: Incidentally noted on imaging studies.  Needs to evaluate for more extensive PAD of the lower extremities.  Normal ABI bilaterally 07/28/2024.  His foot symptoms sound neuropathic rather than ischemic. RA: He is on methotrexate and hydroxychloroquine monitored by Dr. Lady Blanch with [facility].  Watch for signs of excessive QT prolongation, QTc was 455 ms on ECG 6 weeks ago.     Informed Consent   Shared Decision Making/Informed Consent The risks [stroke (1 in 1000), death (1 in 1000), kidney failure [usually temporary] (1 in 500), bleeding (1 in 200), allergic reaction [possibly serious] (1 in 200)], benefits (diagnostic support and management of coronary artery disease) and alternatives of a cardiac catheterization were discussed in detail with Patrick [name] and he is willing to proceed.  Patient Instructions  Medication Instructions:  Take Aspirin  81 mg on the morning of Catheterization *If you need a refill on your cardiac medications before your next appointment, please call your pharmacy*  Lab Work: CBC, BMP If you have labs (blood work) drawn today and your tests are completely normal, you will receive your results only by: MyChart Message (if you have MyChart) OR A paper copy in the mail If you have any lab test that is abnormal or we need to change your treatment, we will call you to review the results.  Testing/Procedures:  [facility] A DEPT OF [facility] [address] A DEPT OF THE [place] [address] [place] [address] Dept: [phone] Loc: [phone]  Patrick [name]  08/31/2024  You are scheduled for a Cardiac Catheterization on Thursday, September 25 with Dr. [name].  1. Please arrive at the [facility] (Main Entrance A) at [facility]: [address] [place], [place] [address] at 7:00 AM (This time is 2 hour(s) before your procedure to ensure your preparation).   Free valet parking service is available. You will check in at ADMITTING. The support person will be asked to wait in the waiting room.  It is OK to have someone drop you off and come back when you are ready to be discharged.    Special note: Every effort is made to have your procedure done on time. Please understand that emergencies sometimes delay scheduled procedures.   2. Diet: Nothing to eat after midnight  3. Hydration: You need to be well hydrated before your procedure. On September 25, you may drink approved liquids (see below) until 2 hours before the procedure, with 16 oz of water as your last intake.   List of approved liquids water, clear juice, clear tea, black coffee, fruit juices, non-citric and without pulp, carbonated beverages, Gatorade, Kool -Aid, plain Jello-O and plain ice popsicles.  4. Labs: CBC AND BMP today  5. Medication instructions in preparation for your procedure:   Contrast Allergy: No   Stop taking Eliquis  (Apixiban) on Monday, September 22. Last dose will be evening dose  Do not take Losartan  hydrochlorothiazide on day of procedure   Do not take Diabetes Med Glucophage (Metformin) on the day of the procedure and HOLD 48 HOURS AFTER THE PROCEDURE.  Hold Trulicity 7 days prior to the procedure: Last dose will be Wednesday September 17th    On the morning of your procedure, take your Aspirin  81 mg and any morning medicines NOT  listed above.  You may use sips of water.  6. Plan to go home the same day, you will only stay overnight if medically necessary. 7. Bring a current list of your medications and current insurance cards. 8. You MUST have a responsible person to drive you home. 9. Someone MUST be with you the first 24 hours after you arrive home or your discharge will be delayed. 10. Please wear clothes that are easy to get on and off and wear slip-on shoes.  Thank you for allowing us  to care for you!   --  Invasive Cardiovascular services   Follow-Up: At [facility], you and your health needs are our priority.  As part of our continuing mission to provide you with exceptional heart care, our providers are all part of one team.  This team includes your primary Cardiologist (physician) and Advanced Practice Providers or APPs (Physician Assistants  and Nurse Practitioners) who all work together to provide you with the care you need, when you need it.  Your next appointment:    Will be scheduled after procedure  Provider:   [name], MD    We recommend signing up for the patient portal called MyChart.  Sign up information is provided on this After Visit Summary.  MyChart is used to connect with patients for Virtual Visits (Telemedicine).  Patients are able to view lab/test results, encounter notes, upcoming appointments, etc.  Non-urgent messages can be sent to your provider as well.   To learn more about what you can do with MyChart, go to [email or web address].        Signed, [name], MD

## 2024-08-31 NOTE — Patient Instructions (Signed)
 Medication Instructions:  Take Aspirin  81 mg on the morning of Catheterization *If you need a refill on your cardiac medications before your next appointment, please call your pharmacy*  Lab Work: CBC, BMP If you have labs (blood work) drawn today and your tests are completely normal, you will receive your results only by: MyChart Message (if you have MyChart) OR A paper copy in the mail If you have any lab test that is abnormal or we need to change your treatment, we will call you to review the results.  Testing/Procedures:  [place] [facility] A DEPT OF [place] AT [address] A DEPT OF THE [place] [address] [place] [address] Dept: [phone] Loc: [phone]  [employer]  08/31/2024  You are scheduled for a Cardiac Catheterization on Thursday, September 25 with Dr. [name].  1. Please arrive at the [facility]Main Entr[facility] at [facility]: [address] [place], [place] [address] at 7:00 AM (This time is 2 hour(s) before your procedure to ensure your preparation).   Free valet parking service is available. You will check in at ADMITTING. The support person will be asked to wait in the waiting room.  It is OK to have someone drop you off and come back when you are ready to be discharged.    Special note: Every effort is made to have your procedure done on time. Please understand that emergencies sometimes delay scheduled procedures.   2. Diet: Nothing to eat after midnight  3. Hydration: You need to be well hydrated before your procedure. On September 25, you may drink approved liquids (see below) until 2 hours before the procedure, with 16 oz of water as your last intake.   List of approved liquids water, clear juice, clear tea, black coffee, fruit juices, non-citric and without pulp, carbonated beverages, Gatorade, Kool -Aid, plain Jello-O and plain ice popsicles.  4. Labs: CBC AND BMP today  5.  Medication instructions in preparation for your procedure:   Contrast Allergy: No   Stop taking Eliquis  (Apixiban) on Monday, September 22. Last dose will be evening dose  Do not take Losartan  hydrochlorothiazide on day of procedure   Do not take Diabetes Med Glucophage (Metformin) on the day of the procedure and HOLD 48 HOURS AFTER THE PROCEDURE.  Hold Trulicity 7 days prior to the procedure: Last dose will be Wednesday September 17th    On the morning of your procedure, take your Aspirin  81 mg and any morning medicines NOT listed above.  You may use sips of water.  6. Plan to go home the same day, you will only stay overnight if medically necessary. 7. Bring a current list of your medications and current insurance cards. 8. You MUST have a responsible person to drive you home. 9. Someone MUST be with you the first 24 hours after you arrive home or your discharge will be delayed. 10. Please wear clothes that are easy to get on and off and wear slip-on shoes.  Thank you for allowing us  to care for you!   -- [facility]   Follow-Up: At [facility], you and your health needs are our priority.  As part of our continuing mission to provide you with exceptional heart care, our providers are all part of one team.  This team includes your primary Cardiologist (physician) and Advanced Practice Providers or APPs (Physician Assistants and Nurse Practitioners) who  all work together to provide you with the care you need, when you need it.  Your next appointment:    Will be scheduled after procedure  Provider:   [name], MD    We recommend signing up for the patient portal called MyChart.  Sign up information is provided on this After Visit Summary.  MyChart is used to connect with patients for Virtual Visits (Telemedicine).  Patients are able to view lab/test results, encounter notes, upcoming appointments, etc.  Non-urgent messages can be  sent to your provider as well.   To learn more about what you can do with MyChart, go to [email or web address].

## 2024-09-01 ENCOUNTER — Ambulatory Visit: Payer: Self-pay | Admitting: Cardiovascular Disease

## 2024-09-01 LAB — BASIC METABOLIC PANEL WITH GFR
BUN/Creatinine Ratio: 21 (ref 10–24)
BUN: 22 mg/dL (ref 8–27)
CO2: 23 mmol/L (ref 20–29)
Calcium: 9 mg/dL (ref 8.6–10.2)
Chloride: 104 mmol/L (ref 96–106)
Creatinine, Ser: 1.06 mg/dL (ref 0.76–1.27)
Glucose: 99 mg/dL (ref 70–99)
Potassium: 3.9 mmol/L (ref 3.5–5.2)
Sodium: 142 mmol/L (ref 134–144)
eGFR: 75 mL/min/1.73 (ref >59)

## 2024-09-01 LAB — CBC
Hematocrit: 37.1 % — ABNORMAL LOW (ref 37.5–51.0)
Hemoglobin: 12.4 g/dL — ABNORMAL LOW (ref 13.0–17.7)
MCH: 33.5 pg — ABNORMAL HIGH (ref 26.6–33.0)
MCHC: 33.4 g/dL (ref 31.5–35.7)
MCV: 100 fL — ABNORMAL HIGH (ref 79–97)
Platelets: 175 x10E3/uL (ref 150–450)
RBC: 3.7 x10E6/uL — ABNORMAL LOW (ref 4.14–5.80)
RDW: 12.9 % (ref 11.6–15.4)
WBC: 4.2 x10E3/uL (ref 3.4–10.8)

## 2024-09-04 NOTE — Progress Notes (Signed)
 Remote PPM Transmission

## 2024-09-09 ENCOUNTER — Encounter: Payer: Self-pay | Admitting: Cardiovascular Disease

## 2024-09-11 ENCOUNTER — Other Ambulatory Visit: Payer: Self-pay | Admitting: *Deleted

## 2024-09-12 ENCOUNTER — Telehealth: Payer: Self-pay | Admitting: *Deleted

## 2024-09-12 NOTE — Telephone Encounter (Addendum)
 Cardiac Catheterization scheduled at [facility] for: Thursday September 14, 2024 9 AM Arrival time [facility] Main Entrance A at: 7 AM  Diet: -Nothing to eat after midnight.  Hydration: -May drink clear liquids until 2 hours before the procedure.  Approved liquids: Water , clear tea, black coffee, fruit juices-non-citric and without pulp,Gatorade, plain Jello/popsicles.   -Please drink 16 oz of water  2 hours before procedure.  Medication instructions: -Hold:  Eliquis -none 09/12/24 until post procedure  Metformin-day of procedure and 48 hours after  Losartan -HCT-AM of procedure  Trulicity-weekly on Fridays  -Other usual morning medications can be taken including aspirin  81 mg.  Plan to go home the same day, you will only stay overnight if medically necessary.  You must have responsible adult to drive you home.  Someone must be with you the first 24 hours after you arrive home.  Reviewed procedure instructions with patient.

## 2024-09-14 ENCOUNTER — Other Ambulatory Visit: Payer: Self-pay

## 2024-09-14 ENCOUNTER — Other Ambulatory Visit (HOSPITAL_COMMUNITY): Payer: Self-pay

## 2024-09-14 ENCOUNTER — Ambulatory Visit (HOSPITAL_COMMUNITY)
Admission: RE | Admit: 2024-09-14 | Discharge: 2024-09-14 | Disposition: A | Discharge status: Home / Self Care | Attending: Cardiovascular Disease | Admitting: Cardiovascular Disease

## 2024-09-14 ENCOUNTER — Ambulatory Visit (HOSPITAL_COMMUNITY)
Admission: RE | Disposition: A | Discharge status: Home / Self Care | Payer: Self-pay | Source: Home / Self Care | Attending: Cardiovascular Disease

## 2024-09-14 DIAGNOSIS — Z7901 Long term (current) use of anticoagulants: Secondary | ICD-10-CM | POA: Diagnosis not present

## 2024-09-14 DIAGNOSIS — Z006 Encounter for examination for normal comparison and control in clinical research program: Secondary | ICD-10-CM

## 2024-09-14 DIAGNOSIS — M069 Rheumatoid arthritis, unspecified: Secondary | ICD-10-CM | POA: Diagnosis not present

## 2024-09-14 DIAGNOSIS — Z955 Presence of coronary angioplasty implant and graft: Secondary | ICD-10-CM | POA: Diagnosis not present

## 2024-09-14 DIAGNOSIS — I25118 Atherosclerotic heart disease of native coronary artery with other forms of angina pectoris: Secondary | ICD-10-CM | POA: Diagnosis not present

## 2024-09-14 DIAGNOSIS — I442 Atrioventricular block, complete: Secondary | ICD-10-CM | POA: Diagnosis present

## 2024-09-14 DIAGNOSIS — I480 Paroxysmal atrial fibrillation: Secondary | ICD-10-CM | POA: Diagnosis not present

## 2024-09-14 DIAGNOSIS — I2582 Chronic total occlusion of coronary artery: Secondary | ICD-10-CM | POA: Diagnosis not present

## 2024-09-14 DIAGNOSIS — Z950 Presence of cardiac pacemaker: Secondary | ICD-10-CM | POA: Insufficient documentation

## 2024-09-14 DIAGNOSIS — E785 Hyperlipidemia, unspecified: Secondary | ICD-10-CM | POA: Diagnosis not present

## 2024-09-14 DIAGNOSIS — Z79899 Other long term (current) drug therapy: Secondary | ICD-10-CM | POA: Diagnosis not present

## 2024-09-14 DIAGNOSIS — I10 Essential (primary) hypertension: Secondary | ICD-10-CM | POA: Insufficient documentation

## 2024-09-14 DIAGNOSIS — R079 Chest pain, unspecified: Secondary | ICD-10-CM | POA: Insufficient documentation

## 2024-09-14 DIAGNOSIS — E118 Type 2 diabetes mellitus with unspecified complications: Secondary | ICD-10-CM | POA: Diagnosis present

## 2024-09-14 DIAGNOSIS — I2510 Atherosclerotic heart disease of native coronary artery without angina pectoris: Secondary | ICD-10-CM | POA: Diagnosis present

## 2024-09-14 HISTORY — PX: LEFT HEART CATH AND CORONARY ANGIOGRAPHY: CATH118249

## 2024-09-14 HISTORY — PX: CORONARY CTO INTERVENTION: CATH118236

## 2024-09-14 LAB — BASIC METABOLIC PANEL WITH GFR
Anion gap: 12 (ref 5–15)
BUN: 20 mg/dL (ref 8–23)
CO2: 22 mmol/L (ref 22–32)
Calcium: 8.7 mg/dL — ABNORMAL LOW (ref 8.9–10.3)
Chloride: 104 mmol/L (ref 98–111)
Creatinine, Ser: 1.05 mg/dL (ref 0.61–1.24)
GFR, Estimated: >60 mL/min (ref >60)
Glucose, Bld: 126 mg/dL — ABNORMAL HIGH (ref 70–99)
Potassium: 3.7 mmol/L (ref 3.5–5.1)
Sodium: 138 mmol/L (ref 135–145)

## 2024-09-14 LAB — POCT ACTIVATED CLOTTING TIME
Activated Clotting Time: 251 s
Activated Clotting Time: 326 s

## 2024-09-14 LAB — GLUCOSE, CAPILLARY: Glucose-Capillary: 128 mg/dL — ABNORMAL HIGH (ref 70–99)

## 2024-09-14 SURGERY — LEFT HEART CATH AND CORONARY ANGIOGRAPHY
Anesthesia: LOCAL

## 2024-09-14 MED ORDER — MIDAZOLAM HCL 2 MG/2ML IJ SOLN
INTRAMUSCULAR | Status: AC
  Filled 2024-09-14: qty 2

## 2024-09-14 MED ORDER — HEPARIN (PORCINE) IN NACL 1000-0.9 UT/500ML-% IV SOLN
INTRAVENOUS | Status: DC | PRN
  Administered 2024-09-14: 1000 mL

## 2024-09-14 MED ORDER — VERAPAMIL HCL 2.5 MG/ML IV SOLN
INTRAVENOUS | Status: DC | PRN
  Administered 2024-09-14: 10 mL via INTRA_ARTERIAL

## 2024-09-14 MED ORDER — SODIUM CHLORIDE 0.9% FLUSH: 3.0000 mL | Freq: Two times a day (BID) | INTRAVENOUS | Status: DC

## 2024-09-14 MED ORDER — HYDRALAZINE HCL 20 MG/ML IJ SOLN: 10.0000 mg | INTRAMUSCULAR | Status: DC | PRN

## 2024-09-14 MED ORDER — SODIUM CHLORIDE 0.9 % IV SOLN: 250.0000 mL | INTRAVENOUS | Status: DC | PRN

## 2024-09-14 MED ORDER — FENTANYL CITRATE (PF) 100 MCG/2ML IJ SOLN
INTRAMUSCULAR | Status: DC | PRN
  Administered 2024-09-14 (×2): 25 ug via INTRAVENOUS

## 2024-09-14 MED ORDER — ASPIRIN 81 MG PO TBEC: 81.0000 mg | DELAYED_RELEASE_TABLET | Freq: Every day | ORAL | Status: AC

## 2024-09-14 MED ORDER — HEPARIN SODIUM (PORCINE) 1000 UNIT/ML IJ SOLN
INTRAMUSCULAR | Status: AC
  Filled 2024-09-14: qty 10

## 2024-09-14 MED ORDER — NITROGLYCERIN 1 MG/10 ML FOR IR/CATH LAB
INTRA_ARTERIAL | Status: DC | PRN
  Administered 2024-09-14: 150 ug via INTRACORONARY

## 2024-09-14 MED ORDER — IOHEXOL 350 MG/ML SOLN
INTRAVENOUS | Status: DC | PRN
  Administered 2024-09-14: 150 mL

## 2024-09-14 MED ORDER — CLOPIDOGREL BISULFATE 75 MG PO TABS
75.0000 mg | ORAL_TABLET | Freq: Every day | ORAL | 3 refills | Status: DC
  Filled 2024-09-14: qty 30, 30d supply, fill #0

## 2024-09-14 MED ORDER — ONDANSETRON HCL 4 MG/2ML IJ SOLN: 4.0000 mg | Freq: Four times a day (QID) | INTRAMUSCULAR | Status: DC | PRN

## 2024-09-14 MED ORDER — VERAPAMIL HCL 2.5 MG/ML IV SOLN
INTRAVENOUS | Status: AC
  Filled 2024-09-14: qty 2

## 2024-09-14 MED ORDER — FENTANYL CITRATE (PF) 100 MCG/2ML IJ SOLN
INTRAMUSCULAR | Status: AC
  Filled 2024-09-14: qty 2

## 2024-09-14 MED ORDER — SODIUM CHLORIDE 0.9% FLUSH: 3.0000 mL | INTRAVENOUS | Status: DC | PRN

## 2024-09-14 MED ORDER — FREE WATER: 500.0000 mL | Freq: Once | Status: DC

## 2024-09-14 MED ORDER — ASPIRIN 81 MG PO CHEW: 81.0000 mg | CHEWABLE_TABLET | Freq: Every day | ORAL | Status: DC

## 2024-09-14 MED ORDER — MIDAZOLAM HCL 2 MG/2ML IJ SOLN
INTRAMUSCULAR | Status: DC | PRN
  Administered 2024-09-14: 2 mg via INTRAVENOUS
  Administered 2024-09-14: 1 mg via INTRAVENOUS

## 2024-09-14 MED ORDER — CLOPIDOGREL BISULFATE 75 MG PO TABS: 75.0000 mg | ORAL_TABLET | Freq: Every day | ORAL | Status: DC

## 2024-09-14 MED ORDER — NITROGLYCERIN 1 MG/10 ML FOR IR/CATH LAB
INTRA_ARTERIAL | Status: AC
  Filled 2024-09-14: qty 10

## 2024-09-14 MED ORDER — LIDOCAINE HCL (PF) 1 % IJ SOLN
INTRAMUSCULAR | Status: DC | PRN
  Administered 2024-09-14: 2 mL

## 2024-09-14 MED ORDER — ASPIRIN 81 MG PO TBEC
81.0000 mg | DELAYED_RELEASE_TABLET | Freq: Every day | ORAL | 2 refills | Status: DC
Start: 2024-09-14 — End: 2024-09-14

## 2024-09-14 MED ORDER — CLOPIDOGREL BISULFATE 75 MG PO TABS: 75.0000 mg | ORAL_TABLET | Freq: Every day | ORAL | 0 refills | Status: DC

## 2024-09-14 MED ORDER — CLOPIDOGREL BISULFATE 300 MG PO TABS
ORAL_TABLET | ORAL | Status: DC | PRN
  Administered 2024-09-14: 600 mg via ORAL

## 2024-09-14 MED ORDER — MORPHINE SULFATE (PF) 2 MG/ML IV SOLN: 2.0000 mg | INTRAVENOUS | Status: DC | PRN

## 2024-09-14 MED ORDER — LIDOCAINE HCL (PF) 1 % IJ SOLN
INTRAMUSCULAR | Status: AC
Start: 2024-09-14 — End: 2024-09-14
  Filled 2024-09-14: qty 30

## 2024-09-14 MED ORDER — CLOPIDOGREL BISULFATE 300 MG PO TABS
ORAL_TABLET | ORAL | Status: AC
  Filled 2024-09-14: qty 1

## 2024-09-14 MED ORDER — OXYCODONE HCL 5 MG PO TABS: 5.0000 mg | ORAL_TABLET | ORAL | Status: DC | PRN

## 2024-09-14 MED ORDER — FAMOTIDINE IN NACL 20-0.9 MG/50ML-% IV SOLN
INTRAVENOUS | Status: AC
  Filled 2024-09-14: qty 50

## 2024-09-14 MED ORDER — LABETALOL HCL 5 MG/ML IV SOLN: 10.0000 mg | INTRAVENOUS | Status: DC | PRN

## 2024-09-14 MED ORDER — ASPIRIN 81 MG PO TBEC: 81.0000 mg | DELAYED_RELEASE_TABLET | Freq: Every day | ORAL | Status: DC

## 2024-09-14 MED ORDER — HEPARIN SODIUM (PORCINE) 1000 UNIT/ML IJ SOLN
INTRAMUSCULAR | Status: DC | PRN
  Administered 2024-09-14 (×2): 5000 [IU] via INTRAVENOUS
  Administered 2024-09-14: 4000 [IU] via INTRAVENOUS

## 2024-09-14 MED ORDER — ACETAMINOPHEN 325 MG PO TABS: 650.0000 mg | ORAL_TABLET | ORAL | Status: DC | PRN

## 2024-09-14 MED ORDER — ASPIRIN 81 MG PO CHEW: 81.0000 mg | CHEWABLE_TABLET | ORAL | Status: DC

## 2024-09-14 SURGICAL SUPPLY — 13 items
BALLOON EMERGE MR 2.5X12 (BALLOONS) IMPLANT
BALLOON TAKERU 2.0X12 (BALLOONS) IMPLANT
CATH 5FR JL3.5 JR4 ANG PIG MP (CATHETERS) IMPLANT
CATH LAUNCHER 6FR EBU3.5 (CATHETERS) IMPLANT
DEVICE RAD COMP TR BAND LRG (VASCULAR PRODUCTS) IMPLANT
GLIDESHEATH SLEND SS 6F .021 (SHEATH) IMPLANT
GUIDEWIRE INQWIRE 1.5J.035X260 (WIRE) IMPLANT
KIT ENCORE 26 ADVANTAGE (KITS) IMPLANT
PACK CARDIAC CATHETERIZATION (CUSTOM PROCEDURE TRAY) ×1 IMPLANT
SET ATX-X65L (MISCELLANEOUS) IMPLANT
STENT SYNERGY XD 3.0X16 (Permanent Stent) IMPLANT
WIRE ASAHI MIRACLEBROS-3 180CM (WIRE) IMPLANT
WIRE RUNTHROUGH IZANAI 014 180 (WIRE) IMPLANT

## 2024-09-14 NOTE — Progress Notes (Signed)
 Patient and family given discharge instructions, education provided, all questions answered. No further questions at this time. Patient ambulated multiple times , voided and able to tolerate PO intake well. Pharmacy stopped by to give patient new medication and provided education.

## 2024-09-14 NOTE — Discharge Summary (Signed)
 Discharge Summary for Same Day PCI   Patient ID: Patrick [name] MRN: [id number]; DOB: [birth year]  Admit date: 09/14/2024 Discharge date: 09/14/2024  Primary Care Provider: [name] ORN, MD  Primary Cardiologist: [name], MD  Primary Electrophysiologist:  None   Discharge Diagnoses    Active Problems:   Type 2 diabetes mellitus with complication, without long-term current use of insulin  (HCC)   Hyperlipidemia LDL goal <70   CAD (coronary artery disease)   Essential hypertension   Complete heart block (HCC)   Rheumatoid arthritis (HCC)   Paroxysmal atrial fibrillation (HCC)   Chest pain of uncertain etiology    Diagnostic Studies/Procedures    Cardiac Catheterization 09/14/2024: 1.  Patent left main, LAD, and RCA with continued patency of the LAD stent and no significant in-stent restenosis 2.  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  Normal LVEDP   Recommendations: Resume apixaban  tomorrow at normal dosing schedule, aspirin  81 mg daily x 30 days then stop, clopidogrel  75 mg daily x 6 months minimum.  Note patient on GI prophylaxis with PPI at baseline.  Same-day discharge protocol.  Diagnostic Dominance: Right  Intervention     _____________   History of Present Illness     Patrick [name] is a 68 y.o. male with a history of CAD with STEMI in 2018 s/p DES to LAD, paroxysmal atrial fibrillation on Eliquis , complete heart block s/p dual chamber PPM, hypertension, hyperlipidemia, type 2 diabetes mellitus, and rheumatoid arthritis. Reecntly underwent cardiac PET stress test in 06/2024 which was intermediate risk with a reversible defect in the mid to basal anterolateral and inferolateral location. He was seen by Dr. Balding on 08/31/2024 and continued to have exertional angina. Therefore, cardiac catheterization was arranged for further evaluation.  Hospital Course     Patient  presented to [facility] on [facility]/14/2024 for planned cardiac catheterization. LHC showed chronic occlusion of large OM branch of LCX with patent stent to LAD. He underwent successful PCI with DES to OM lesion. Plan is for triple therapy with Aspirin  81mg  daily, Plavix  75mg  daily, and Eliquis  5mg  twice daily for one month at which time Aspirin  can be stopped. Plavix  can be stopped after 6 months. Eliquis  can be restarted on 09/15/2024. Patient already on PPI at home. The patient was seen by Cardiac Rehab while in short stay. There were no observed complications post cath. Right radial cath site was re-evaluated prior to discharge and found to be stable without any complications. Instructions/precautions regarding cath site care were given prior to discharge.  Patrick [name] was seen by Dr. Wonda and determined stable for discharge home. Follow up with our office has been arranged. Medications are listed below. Pertinent changes include the addition of Aspirin  and Plavix . Can stop Ranexa  given revascularization. Will continue home Imdur  for now but can consider stopping this at follow-up as well if no recurrent chest pain. Can resume home Metformin 48 hours after cardiac catheterization. He is only on Crestor  5mg  daily at home but LDL was well controlled at 26 in 04/2024 and he has not been able to tolerate high doses of statins so will continue current dose.   _____________  Cath/PCI Registry Performance & Quality Measures: Aspirin  prescribed? - Yes ADP Receptor Inhibitor (Plavix /Clopidogrel , Brilinta /Ticagrelor  or Effient/Prasugrel) prescribed (includes medically managed patients)? - Yes High Intensity Statin (Lipitor  40-80mg  or Crestor  20-40mg ) prescribed? - No - only on  Crestor  5mg  daily but LDL well controlled and unable to tolerate higher doses. For EF <40%, was ACEI/ARB prescribed? - Not Applicable (EF >/= 40%) For EF <40%, Aldosterone Antagonist (Spironolactone or Eplerenone) prescribed? - Not  Applicable (EF >/= 40%) Cardiac Rehab Phase II ordered (Included Medically managed Patients)? - Yes  _____________   Discharge Vitals Blood pressure 128/74, pulse 64, temperature 97.9 F (36.6 C), resp. rate 19, height 5' 11 (1.803 m), weight 93 kg, SpO2 98%.  Filed Weights   09/14/24 0753  Weight: 93 kg    Last Labs & Radiologic Studies    CBC No results for input(s): WBC, NEUTROABS, HGB, HCT, MCV, PLT in the last 72 hours. Basic Metabolic Panel Recent Labs    90/74/74 0844  NA 138  K 3.7  CL 104  CO2 22  GLUCOSE 126*  BUN 20  CREATININE 1.05  CALCIUM  8.7*   Liver Function Tests No results for input(s): AST, ALT, ALKPHOS, BILITOT, PROT, ALBUMIN in the last 72 hours. No results for input(s): LIPASE, AMYLASE in the last 72 hours. High Sensitivity Troponin:   No results for input(s): TROPONINIHS in the last 720 hours.  BNP Invalid input(s): POCBNP D-Dimer No results for input(s): DDIMER in the last 72 hours. Hemoglobin A1C No results for input(s): HGBA1C in the last 72 hours. Fasting Lipid Panel No results for input(s): CHOL, HDL, LDLCALC, TRIG, CHOLHDL, LDLDIRECT in the last 72 hours. Thyroid Function Tests No results for input(s): TSH, T4TOTAL, T3FREE, THYROIDAB in the last 72 hours.  Invalid input(s): FREET3 _____________  CARDIAC CATHETERIZATION Result Date: 09/14/2024 1.  Patent left main, LAD, and RCA with continued patency of the LAD stent and no significant in-stent restenosis 2.  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  Normal LVEDP Recommendations: Resume apixaban  tomorrow at normal dosing schedule, aspirin  81 mg daily x 30 days then stop, clopidogrel  75 mg daily x 6 months minimum.  Note patient on GI prophylaxis with PPI at baseline.  Same-day discharge protocol.   CUP PACEART REMOTE DEVICE CHECK Result Date:  08/16/2024 PPM scheduled remote reviewed. Normal device function.  Presenting rhythm: AS-VS Next remote 91 days. AB, CVRS   Disposition   Patient is being discharged home today in good condition.  Follow-up Plans & Appointments     Follow-up Information     Patrick Raphael SAILOR, PA-C Follow up.   Specialties: Cardiology, Radiology Why: Follow-up with Cardiology scheduled for 10/10/2024 at 10:05am. Please arrive 20 minutes early for check-in. If this date/ time does not work for you, please call our office to reschedule. Contact information: [address] [place] [address] [phone]                Discharge Instructions     Amb Referral to Cardiac Rehabilitation   Complete by: As directed    Diagnosis: Coronary Stents   After initial evaluation and assessments completed: Virtual Based Care may be provided alone or in conjunction with Phase 2 Cardiac Rehab based on patient barriers.: Yes   Intensive Cardiac Rehabilitation (ICR) [facility] location only OR Traditional Cardiac Rehabilitation (TCR) *If criteria for ICR are not met will enroll in TCR [facility] only): Yes        Discharge Medications   Allergies as of 09/14/2024   No Known Allergies      Medication List     PAUSE taking these medications    metFORMIN  500 MG 24 hr tablet Wait to take this until: September 17, 2024 Morning Commonly known as: GLUCOPHAGE-XR Take 1,000 mg by mouth 2 (two) times daily with a meal. What changed:  how much to take when to take this additional instructions       STOP taking these medications    ranolazine  1000 MG SR tablet Commonly known as: RANEXA        TAKE these medications    ABC PLUS SENIOR PO Take 1 tablet by mouth 2 (two) times daily.   ACETYL L-CARNITINE PO Take 1,500 mg by mouth daily.   ALPHA LIPOIC ACID PO Take 600 mg by mouth daily.   amLODipine  5 MG tablet Commonly known as: NORVASC  Take 5 mg by mouth daily.   apixaban  5 MG Tabs  tablet Commonly known as: ELIQUIS  Take 1 tablet (5 mg total) by mouth 2 (two) times daily.   ascorbic acid 500 MG tablet Commonly known as: VITAMIN C Take 500 mg by mouth daily.   aspirin  EC 81 MG tablet Take 1 tablet (81 mg total) by mouth daily. Swallow whole. Notes to patient: STOP taking after 10/14/24   cholecalciferol 25 MCG (1000 UNIT) tablet Commonly known as: VITAMIN D3 Take 1,000 Units by mouth daily.   clopidogrel  75 MG tablet Commonly known as: Plavix  Take 1 tablet (75 mg total) by mouth daily.   Coenzyme Q10 200 MG capsule Take 200 mg by mouth daily.   cyanocobalamin 1000 MCG tablet Commonly known as: VITAMIN B12 1,000 mcg daily.   FISH OIL OMEGA-3 PO Take 2 capsules by mouth 2 (two) times daily. 850 mg EPA 576 mg DHA   folic acid 1 MG tablet Commonly known as: FOLVITE Take 1 mg by mouth daily.   Glucosamine HCl 1500 MG Tabs Take 1,500 mg by mouth daily.   hydroxychloroquine 200 MG tablet Commonly known as: PLAQUENIL Take 200 mg by mouth 2 (two) times daily.   isosorbide  mononitrate 60 MG 24 hr tablet Commonly known as: IMDUR  Take 1 tablet (60 mg total) by mouth daily.   losartan -hydrochlorothiazide 100-12.5 MG tablet Commonly known as: HYZAAR Take 1 tablet by mouth daily.   methotrexate 2.5 MG tablet Commonly known as: RHEUMATREX Take 2.5 mg by mouth as directed. Take 1 tablet for 5 days out of 7, weekly What changed:  how much to take when to take this additional instructions   metoprolol  tartrate 25 MG tablet Commonly known as: LOPRESSOR  Take 1 tablet (25 mg total) by mouth 2 (two) times daily.   nitroGLYCERIN  0.4 MG SL tablet Commonly known as: NITROSTAT  DISSOLVE ONE TABLET UNDER TONGUE AS NEEDED FOR CHEST PAIN EVERY 5 MINUTES FOR 3 DOSES   nystatin cream Commonly known as: MYCOSTATIN Apply 1 application  topically daily as needed for dry skin. Mix with triamcinolone   pantoprazole  40 MG tablet Commonly known as: PROTONIX  Take  40 mg by mouth daily.   Potassium 99 MG Tabs Take 99 mg by mouth daily.   rosuvastatin  5 MG tablet Commonly known as: CRESTOR  Take 5 mg by mouth daily.   tamsulosin  0.4 MG Caps capsule Commonly known as: FLOMAX  Take 2 capsules (0.8 mg total) by mouth daily. What changed:  how much to take when to take this   triamcinolone cream 0.1 % Commonly known as: KENALOG Apply 1 application  topically daily as needed (rash). Mix with Nystatin   Trulicity 1.5 MG/0.5ML Soaj Generic drug: Dulaglutide Inject 1.5 mg into the skin once a week.  Allergies No Known Allergies  Outstanding Labs/Studies   N/A  Duration of Discharge Encounter   Greater than 25 minutes including physician time.  Signed, Ethlyn Alto E Treazure Nery, PA-C 09/14/2024, 5:02 PM

## 2024-09-14 NOTE — Research (Addendum)
 Prevail Informed Consent   Subject Name: Patrick [name]  Subject met inclusion and exclusion criteria.  The informed consent form, study requirements and expectations were reviewed with the subject and questions and concerns were addressed prior to the signing of the consent form.  The subject verbalized understanding of the trial requirements.  The subject agreed to participate in the Prevail trial and signed the informed consent at 0810 on 09/14/2024.  The informed consent was obtained prior to performance of any protocol-specific procedures for the subject.  A copy of the signed informed consent was given to the subject and a copy was placed in the subject's medical record.   Patrick [name]   Screen Fail

## 2024-09-14 NOTE — Progress Notes (Signed)
 CARDIAC REHAB PHASE I   Pt and wife educated on PCI/DAPT, restrictions, site care, exercise guidelines, HH and DM nutrition, angina/NTG, and referral to CRP2. Referral to be placed to [facility]. Pt and wife had several appropriate questions. All questions answered, pt voiced understanding.  [phone]  Con KATHEE Pereyra, MS, ACSM-CEP 09/14/2024 2:51 PM

## 2024-09-14 NOTE — Interval H&P Note (Signed)
 History and Physical Interval Note:  09/14/2024 9:04 AM  Patrick [name]  has presented today for surgery, with the diagnosis of angina.  The various methods of treatment have been discussed with the patient and family. After consideration of risks, benefits and other options for treatment, the patient has consented to  Procedure(s): LEFT HEART CATH AND CORONARY ANGIOGRAPHY (N/A) as a surgical intervention.  The patient's history has been reviewed, patient examined, no change in status, stable for surgery.  I have reviewed the patient's chart and labs.  Questions were answered to the patient's satisfaction.     [name]

## 2024-09-14 NOTE — Discharge Instructions (Signed)

## 2024-09-15 ENCOUNTER — Encounter (HOSPITAL_COMMUNITY): Payer: Self-pay | Admitting: Cardiovascular Disease

## 2024-09-15 MED FILL — Famotidine in NaCl 0.9% IV Soln 20 MG/50ML: INTRAVENOUS | Qty: 50 | Status: AC

## 2024-09-16 ENCOUNTER — Encounter: Payer: Self-pay | Admitting: Cardiovascular Disease

## 2024-09-20 NOTE — Progress Notes (Signed)
 Rheumatology Follow Up Note  Chief Complaint  Patient presents with  . Rheumatoid Arthritis      Subjective:HPI  Patrick [name] is a 68 y.o. male is here today for follow up of rheumatoid arthritis stage 1 mild. The patient's allergies, current medications, past family history, past medical history, past social history, past surgical history and problem list were reviewed and updated as appropriate.   He is taking the Plaquenil and Methotrexate, folic acid. He is tolerating the medication well. He does get MTP pain. He continues to have trouble forming a fist. He denies any major swelling of the joints. He has chronic DIP and PIP enlargement. He does get midfoot pains over the past few days. He denies any swelling of the other joints. There is some numbness of the hands, feet and toes. He has no fever, infection, skin rash, nasal/oral ulcer or dry cough.   Review of Systems:   Review of Systems  Constitutional:  Positive for fatigue.  HENT:  Positive for tinnitus. Negative for mouth sores and trouble swallowing.        Neg: Dry Mouth  Eyes:  Negative for redness.       Neg: Dry Eyes  Respiratory:  Negative for cough and shortness of breath.   Cardiovascular:  Negative for chest pain and leg swelling.  Gastrointestinal:  Negative for constipation, diarrhea and nausea.  Endocrine: Negative for cold intolerance and heat intolerance.  Genitourinary:  Negative for hematuria.  Musculoskeletal:        Per HPI  Skin:  Negative for color change and rash.  Neurological:  Positive for dizziness. Negative for weakness, numbness and headaches.  Hematological:  Does not bruise/bleed easily.  Psychiatric/Behavioral:  Negative for dysphoric mood and sleep disturbance. The patient is nervous/anxious.   All other systems reviewed and are negative.  Objective:  Vitals:   09/20/24 1009  BP: 121/62  Pulse: 80  Temp: 36.3 C (97.3 F)  Weight: 96.6 kg (213 lb)  Height: 180.3 cm (5' 11)  PainSc:    4  PainLoc: Hand       Length of Stiffness: All Day   GEN - Pleasant, No Apparent Distress  HEENT - normocephalic and atraumatic. Conjunctiva Clear.  Neck - supple with no adenopathy or thyromegaly.   C spine with full range of motion. Heart - regular rate and rhythm, No murmurs/gallops/rub, Nml S1S2 Lungs - clear to auscultation in all fields. Extremities - there is no cyanosis or edema. Neurological - alert and oriented.  Spine - no paraspinal tenderness; no Lumbar spine tenderness Skin - no rashes observed MSK - The following joints were examined bilaterally: Hands, Wrists, Elbows, Shoulders, Metatarsals, Ankels, Knees and Hips; they were normal apart from what is noted.    80% Fist Formation DIP and PIP Enlargement Both MTP and midfoot tenderness No Dactylitis No Tender Point ______________________________________________________________________ Labs/Imaging Reviewed in EMR Cr 1.2, AST 18, ALT 25 CBC Hgb 13.0, Hct 39.0 HgA1c 5.7 Uric Acid 5.6 ESR 05 Pos: RF 110.9 (H) Neg: 14.3.3 ETA, ANA IFA, AntiCCP, SSA, SSB, Anti-CarP, Anti-CEP-1, Anti-Sa Neg: Hep B and C   Hand Xray (05/2023): Severe Osteoarthritis     CDAI is calculated as follows:  CDAI = SJC + TJC +PGA + EGA = 4 Where:  SJC = Swollen Joint Count TJC = Tender Joint Count PGA = Patient Global Assessment of Disease Activity EGA = Evaluator Global Assessment of Disease Activity  Interpretation <=2.8: Remission >2.8 and <=10: Low Disease Activity >10  and <=22: Moderate Disease Activity >22: High Disease Activity   Assessment and Plan   1.Rheumatoid arthritis stage 1 mild: Stable -- Positive RF, Synovitis and Tenderness on exam -- Continue Plaquenil 200 mg twice daily -- Continue Methotrexate 15.0 and Folic Acid 1mg  Daily  2. CKD stage 3 -- Monitor  -- Avoid NSAIDs   3. Osteoarthritis of both hands, feet -- On Plaquenil  -- Can take Tylenol  for pain control  -- Can use Voltaren Gel   4. Long  term use of immunosuppressive therapy -- Methotrexate is an immunosuppressive medication that require drug toxicity monitoring.  -- Hydroxychloroquine (Plaquenil) is an immunosuppressive medication that require monitoring for eye toxicity. [facility] -- Check Labs  Diagnoses and all orders for this visit:  Rheumatoid arthritis involving multiple sites with positive rheumatoid factor (CMS/HHS-HCC) -     CBC w/auto Differential (5 Part) -     Aspartate Aminotransferase (AST) -     Albumin -     Alanine Aminotransferase (ALT) -     Creatinine -     Sedimentation Rate-Westergren - [facility] -     hydroxychloroquine (PLAQUENIL) 200 mg tablet; Take 1 tablet (200 mg total) by mouth 2 (two) times daily -     methotrexate (RHEUMATREX) 2.5 MG tablet; Take 6 tablets (15 mg total) by mouth every 7 (seven) days  Primary osteoarthritis of both hands  Long-term use of immunosuppressant medication -     CBC w/auto Differential (5 Part) -     Aspartate Aminotransferase (AST) -     Albumin -     Alanine Aminotransferase (ALT) -     Creatinine -     Sedimentation Rate-Westergren - [facility]      Return in about 4 months (around 01/21/2025) for Routine Follow Up.   All new prescription medications, changes in current prescription dosages, and sample medications were discussed with the patient, including patient education, medication name, use, dosage, potential side effects, drug interactions, consequences of not using/taking, and special instructions.  Patient expressed understanding.  No barriers to adherence.   I appreciate the opportunity to participate in the care of Patrick [name]. Please do not hesitate to contact me with any questions or concerns that may arise in regards to the patient's rheumatologic disease.    Attestation Statement:   I personally performed the service. (TP)  MAYUR LOREE BLANCH, MD

## 2024-10-04 ENCOUNTER — Ambulatory Visit: Admitting: Physician Assistant

## 2024-10-05 ENCOUNTER — Other Ambulatory Visit: Payer: Self-pay | Admitting: Internal Medicine

## 2024-10-05 DIAGNOSIS — I480 Paroxysmal atrial fibrillation: Secondary | ICD-10-CM

## 2024-10-05 NOTE — Telephone Encounter (Signed)
 About a week.  No restrictions at this point

## 2024-10-06 NOTE — Telephone Encounter (Signed)
 Prescription refill request for Eliquis  received. Indication: a fib Last office visit: 08/31/24 Scr: 1.05 epic 09/14/24 Age: 68 Weight: 93kg

## 2024-10-09 NOTE — Progress Notes (Unsigned)
 Cardiology Office Note:    Date:  10/10/2024   ID:  EUSTACIO ELLEN, DOB [birth year], MRN [id number]  PCP:  [name] ORN, MD   [facility] Providers Cardiologist:  [name], MD     Referring MD: [name] ORN, MD   Chief complaint: Follow-up LHC     History of Present Illness:   Patrick [name] is a 68 y.o. male with a history of CAD, paroxysmal atrial fibrillation on Eliquis , complete heart block s/p dual chamber PPM 2023, hypertension, hyperlipidemia, type 2 diabetes mellitus, and rheumatoid arthritis presenting to office today following recent left heart catheterization.  2018 STEMI treated with PCI of LAD after D1, 30X38 millimeters stent placed resulting in TIMI-3 flow.  Developed transient heart block during procedure requiring IV atropine  that improved. Over the years he has had episodic stress testing with titration of anti-anginal.  Presented to [facility] ED 03/29/2021 with dizziness/bradycardia on beta blocker therapy which was discontinued. Initially not felt to require pacemaker. However, he developed complete heart block 05/15/2022, with rates in the 30s. Had Biotronik dual-chamber permanent pacemaker for symptomatic bradycardia secondary to complete heart block placed by Dr. Cathlyn Birmingham on 05/17/2022.  02/02/2023 he had recently started on CPAP therapy and noticed an improvement in his sleep pattern.  Seen by Dr. Birmingham 10/04/2023 for a several hour episode of PAF, recommended he discontinue DAPT and initiate Eliquis .  Patient was cardiac unaware, asymptomatic aside from occasional sensation of chest pressure when walking up hills.  Seen by Dr. Burnard 06/08/2024 complaining of continued chest pressure. NM PET stress test on 07/13/2024 showed abnormal perfusion, intermediate risk study.   Evaluated by Dr. Balding 08/31/2024, LHC was ordered given recent results of stress test as well as continuing exertional angina.  LHC on 09/14/2024: Patent left main, LAD, and  RCA with continued patency of LAD stent and no significant in-stent restenosis.  Chronic occlusion of large OM branch of the circumflex, successfully treated with PCI using a 3.0 X 16 mm Synergy DES.  Normal LVEDP, apixaban  continued.  DAPT with aspirin  X Plavix  recommended X 1 month.  Aspirin  to be stopped at 30 days, with continuation of Plavix  75 mg daily X 6 months.  Patient presents to clinic alone today, doing well from a cardiovascular standpoint. He denies chest pain, palpitations, dyspnea, orthopnea, n, v,  dark/tarry/bloody stools, hematuria, dizziness, syncope, edema, weight gain.  States directly after his cath he felt close to 20 years younger, with major improvement in prior cardiac symptoms.  Recently returned from a 2-week vacation, at which time he was required to traverse 4-5 flights of stairs, was able to complete this without stopping to rest without chest pain/nausea.  Reports he missed 1 dose of aspirin  shortly after his procedure, has not missed any other doses with aspirin /Plavix /Eliquis  since.  Does not check his blood pressures regularly at home.  Primary complaint is questioning discoloration in bilateral lower extremities ongoing over the last year, worsening primarily over the last 6 months, with a bruised sensation in bilateral feet occurring over the last month.   ROS:   Please see the history of present illness.     All other systems reviewed and are negative.     Past Medical History:  Diagnosis Date   CAD in native artery    a. anterolateral STEMI 07/2017 s/p DES to prox-mid LAD, EF 55%.   GERD (gastroesophageal reflux disease)    History of kidney stones    Hyperlipidemia  Hypertension    Myocardial infarction [facility])    Overactive bladder    Presence of permanent cardiac pacemaker    Renal calculi    Rheumatoid arthritis (HCC)    Syncope    Uncontrolled diabetes mellitus     Past Surgical History:  Procedure Laterality Date   CORONARY CTO INTERVENTION  N/A 09/14/2024   Procedure: CORONARY CTO INTERVENTION;  Surgeon: Wonda Sharper, MD;  Location: [facility] INVASIVE CV LAB;  Service: Cardiovascular;  Laterality: N/A;   CORONARY/GRAFT ACUTE MI REVASCULARIZATION N/A 08/04/2017   Procedure: Coronary/Graft Acute MI Revascularization;  Surgeon: Burnard Debby LABOR, MD;  Location: [facility] INVASIVE CV LAB;  Service: Cardiovascular;  Laterality: N/A;   LEFT HEART CATH AND CORONARY ANGIOGRAPHY N/A 08/04/2017   Procedure: LEFT HEART CATH AND CORONARY ANGIOGRAPHY;  Surgeon: Burnard Debby LABOR, MD;  Location: [facility] INVASIVE CV LAB;  Service: Cardiovascular;  Laterality: N/A;   LEFT HEART CATH AND CORONARY ANGIOGRAPHY N/A 03/21/2018   Procedure: LEFT HEART CATH AND CORONARY ANGIOGRAPHY;  Surgeon: Burnard Debby LABOR, MD;  Location: [facility] INVASIVE CV LAB;  Service: Cardiovascular;  Laterality: N/A;   LEFT HEART CATH AND CORONARY ANGIOGRAPHY N/A 09/14/2024   Procedure: LEFT HEART CATH AND CORONARY ANGIOGRAPHY;  Surgeon: Wonda Sharper, MD;  Location: [facility] INVASIVE CV LAB;  Service: Cardiovascular;  Laterality: N/A;   PACEMAKER IMPLANT N/A 05/17/2022   Procedure: PACEMAKER IMPLANT;  Surgeon: [name] Danelle ORN, MD;  Location: [facility] INVASIVE CV LAB;  Service: Cardiovascular;  Laterality: N/A;   PROSTATE SURGERY     in office Dr Kassie   TEMPORARY PACEMAKER N/A 07/14/2021   Procedure: TEMPORARY PACEMAKER;  Surgeon: Anner Alm ORN, MD;  Location: [facility] INVASIVE CV LAB;  Service: Cardiovascular;  Laterality: N/A;   TONSILLECTOMY      Current Medications: Current Meds  Medication Sig   Acetylcarnitine HCl (ACETYL L-CARNITINE PO) Take 1,500 mg by mouth daily.   ALPHA LIPOIC ACID PO Take 600 mg by mouth daily.   amLODipine  (NORVASC ) 5 MG tablet Take 5 mg by mouth daily.   apixaban  (ELIQUIS ) 5 MG TABS tablet TAKE 1 TABLET(5 MG) BY MOUTH TWICE DAILY   aspirin  EC 81 MG tablet Take 1 tablet (81 mg total) by mouth daily. Swallow whole.   cholecalciferol (VITAMIN D3) 25 MCG (1000 UNIT) tablet Take 1,000  Units by mouth daily.   Coenzyme Q10 200 MG capsule Take 200 mg by mouth daily.   cyanocobalamin (VITAMIN B12) 1000 MCG tablet 1,000 mcg daily.   folic acid (FOLVITE) 1 MG tablet Take 1 mg by mouth daily.   Glucosamine HCl 1500 MG TABS Take 1,500 mg by mouth daily.   hydroxychloroquine (PLAQUENIL) 200 MG tablet Take 200 mg by mouth 2 (two) times daily.   isosorbide  mononitrate (IMDUR ) 60 MG 24 hr tablet Take 1 tablet (60 mg total) by mouth daily.   losartan -hydrochlorothiazide (HYZAAR) 100-12.5 MG tablet Take 1 tablet by mouth daily.   metFORMIN (GLUCOPHAGE-XR) 500 MG 24 hr tablet Take 1,000 mg by mouth 2 (two) times daily with a meal. (Patient taking differently: Take 500-1,000 mg by mouth See admin instructions. Take 500 mg in the morning and 1000 mg at bedtime)   methotrexate (RHEUMATREX) 2.5 MG tablet Take 2.5 mg by mouth as directed. Take 1 tablet for 5 days out of 7, weekly (Patient taking differently: Take 15 mg by mouth once a week.)   Multiple Vitamins-Minerals (ABC PLUS SENIOR PO) Take 1 tablet by mouth 2 (two) times daily.   nitroGLYCERIN  (NITROSTAT ) 0.4 MG  SL tablet DISSOLVE ONE TABLET UNDER TONGUE AS NEEDED FOR CHEST PAIN EVERY 5 MINUTES FOR 3 DOSES   nystatin cream (MYCOSTATIN) Apply 1 application  topically daily as needed for dry skin. Mix with triamcinolone   Omega-3 Fatty Acids (FISH OIL OMEGA-3 PO) Take 2 capsules by mouth 2 (two) times daily. 850 mg EPA 576 mg DHA   pantoprazole  (PROTONIX ) 40 MG tablet Take 40 mg by mouth daily.   Potassium 99 MG TABS Take 99 mg by mouth daily.   rosuvastatin  (CRESTOR ) 5 MG tablet Take 5 mg by mouth daily.   tamsulosin  (FLOMAX ) 0.4 MG CAPS capsule Take 2 capsules (0.8 mg total) by mouth daily. (Patient taking differently: Take 0.4 mg by mouth 2 (two) times daily.)   triamcinolone cream (KENALOG) 0.1 % Apply 1 application  topically daily as needed (rash). Mix with Nystatin   TRULICITY 1.5 MG/0.5ML SOAJ Inject 1.5 mg into the skin once a week.    vitamin C (ASCORBIC ACID) 500 MG tablet Take 500 mg by mouth daily.   [DISCONTINUED] clopidogrel  (PLAVIX ) 75 MG tablet Take 1 tablet (75 mg total) by mouth daily.     Allergies:   Patient has no known allergies.   Social History   Socioeconomic History   Marital status: Married    Spouse name: Not on file   Number of children: 3   Years of education: GED   Highest education level: Not on file  Occupational History   Occupation: Editor, commissioning  Tobacco Use   Smoking status: Former    Types: Cigarettes   Smokeless tobacco: Never   Tobacco comments:    Quit smoking at age 49.  Vaping Use   Vaping status: Never Used  Substance and Sexual Activity   Alcohol use: Yes    Alcohol/week: 2.0 standard drinks of alcohol    Types: 2 Shots of liquor per week   Drug use: No   Sexual activity: Not on file  Other Topics Concern   Not on file  Social History Narrative   Lives at home with his wife.   Right-handed.   Caffeine use: 3.5 cups per day.   Social Drivers of Corporate investment banker Strain: Low Risk  (01/19/2024)   Received from [facility]   Overall Financial Resource Strain (CARDIA)    Difficulty of Paying Living Expenses: Not hard at all  Food Insecurity: No Food Insecurity (01/19/2024)   Received from [facility]   Hunger Vital Sign    Within the past 12 months, you worried that your food would run out before you got the money to buy more.: Never true    Within the past 12 months, the food you bought just didn't last and you didn't have money to get more.: Never true  Transportation Needs: No Transportation Needs (01/19/2024)   Received from [facility] - Transportation    In the past 12 months, has lack of transportation kept you from medical appointments or from getting medications?: No    Lack of Transportation (Non-Medical): No  Physical Activity: Not on file  Stress: Not on file  Social  Connections: Not on file     Family History: The patient's family history includes Alcohol abuse in his brother; Dementia in his mother; Heart attack in his father; Heart disease in his father; Stroke in his daughter.  EKGs/Labs/Other Studies Reviewed:    The following studies were reviewed today:  EKG Interpretation Date/Time:  Tuesday October 10 2024 10:24:16 EDT Ventricular Rate:  74 PR Interval:  216 QRS Duration:  144 QT Interval:  418 QTC Calculation: 463 R Axis:   32  Text Interpretation: Atrial-paced rhythm with prolonged AV conduction Right bundle branch block When compared with ECG of 14-Sep-2024 10:23, Electronic atrial pacemaker has replaced Sinus rhythm Confirmed by Elaine Moloney [phone]) on 10/10/2024 11:28:05 AM    Recent Labs: 08/31/2024: Hemoglobin 12.4; Platelets 175 09/14/2024: BUN 20; Creatinine, Ser 1.05; Potassium 3.7; Sodium 138  Recent Lipid Panel    Component Value Date/Time   CHOL 79 07/14/2021 1159   TRIG 89 07/14/2021 1159   HDL 27 (L) 07/14/2021 1159   CHOLHDL 2.9 07/14/2021 1159   VLDL 18 07/14/2021 1159   LDLCALC 34 07/14/2021 1159      Physical Exam:    VS:  BP 130/82   Pulse 74   Ht 5' 11 (1.803 m)   Wt 214 lb 6.4 oz (97.3 kg)   SpO2 97%   BMI 29.90 kg/m        Wt Readings from Last 3 Encounters:  10/10/24 214 lb 6.4 oz (97.3 kg)  09/14/24 205 lb (93 kg)  08/31/24 211 lb 4.8 oz (95.8 kg)     GEN:  Well nourished, well developed in no acute distress HEENT: Normal NECK:  No carotid bruits CARDIAC:  S1-S2 normal, RRR, no murmurs, rubs, gallops RESPIRATORY:  Clear to auscultation without rales, wheezing or rhonchi  MUSCULOSKELETAL:  No edema; No deformity, right radial site appeals well-healed, no hematoma/ecchymosis, 2+ radial pulse over puncture site.  Neurovascularly intact distal to access site.  PT/DP palpable bilaterally.  Bilateral scattered hyperpigmentation over anterior shins and medial ankles, extending across the  superior distal feet, see pictures below SKIN: Warm and dry NEUROLOGIC:  Alert and oriented x 3 PSYCHIATRIC:  Normal affect               Assessment & Plan Coronary artery disease involving native coronary artery of native heart, unspecified whether angina present EKG: Atrial paced rhythm with prolonged AV conduction, right bundle branch block, 74 bpm Right radial site appears well-healed, no bruising/ecchymosis/hematoma/tenderness.  Radial pulse 2+ Reports a day or 2 following the procedure he had some mild chest soreness that resolved.  Since then he has felt much improved, symptoms prior to cath fully resolved.  Able to traverse 4-5 flights of stairs on vacation without return of cardiac symptoms. Denies CP, SOB, palpitations, near-syncope Reports he did miss 1 dose of aspirin  2 weeks ago, other than that has not missed any other doses of DAPT or OAC Continue amlodipine  5 mg daily Stop aspirin  81 mg on 10/14/2024, continue daily until then (2/2 OAC for PAF) Continue Plavix  75 mg daily Continue Imdur  60 mg daily Continue nitroglycerin  0.4 mg SL as needed for chest pain every 5 minutes (has not needed this) Continue Crestor  5 mg daily - previously intolerant to higher doses and LDL at goal Patient is okay to proceed with cardiac rehab Discoloration of skin of lower leg Bilateral lower extremity hyperpigmentation, primarily around ankles and anterior shins bilaterally.   Ongoing over the last year, worsening over the last 6 months Recent ABIs normal Palpable PT/DP pulses bilaterally See pictures in chart Possibly secondary to hemosiderin deposition possibly due to venous reflux, will reach out to Dr. Francyne for input Recommends he follow-up with vascular or rheumatology for further evaluation of this PAF (paroxysmal atrial fibrillation) (HCC) EKG as above, patient is in atrial paced  rhythm Denies any excessive bleeding/bruising/rash while on OAC Continue Eliquis  5 mg twice  daily as this is the correct dose for his age (12), weight (97 kg), and creatinine (1.05) CBC 09/20/2024: Hemoglobin 12.4, platelet 154 Repeat CBC today to monitor anemia Primary hypertension BPs reported well-controlled Continue losartan -hydrochlorothiazide 100-12.5 mg daily Continue Imdur  60 mg daily Creatinine (Care Everywhere) 09/20/2024: 1.1 Hyperlipidemia, unspecified hyperlipidemia type LDL goal <55 Lipid panel 05/04/2024: Cholesterol 98, triglyceride 157, HDL 41, LDL 26 09/20/2024: AST 21, ALT 34 Continue Crestor  5 mg daily  Follow-up in March with Dr. Francyne. He is also due for EP f/u of his pacemaker with Dr. [name] as of this month, encouraged to schedule.    Cardiac Rehabilitation Eligibility Assessment            Medication Adjustments/Labs and Tests Ordered: Current medicines are reviewed at length with the patient today.  Concerns regarding medicines are outlined above.  Orders Placed This Encounter  Procedures   CBC   EKG 12-Lead   Meds ordered this encounter  Medications   clopidogrel  (PLAVIX ) 75 MG tablet    Sig: Take 1 tablet (75 mg total) by mouth daily.    Dispense:  90 tablet    Refill:  1    Patient Instructions  Medication Instructions:  Your physician recommends that you continue on your current medications as directed. Please refer to the Current Medication list given to you today.  *If you need a refill on your cardiac medications before your next appointment, please call your pharmacy*  Lab Work: Today- CBC If you have labs (blood work) drawn today and your tests are completely normal, you will receive your results only by: MyChart Message (if you have MyChart) OR A paper copy in the mail If you have any lab test that is abnormal or we need to change your treatment, we will call you to review the results.  Follow-Up: At [facility], you and your health needs are our priority.  As part of our continuing mission to provide you with  exceptional heart care, our providers are all part of one team.  This team includes your primary Cardiologist (physician) and Advanced Practice Providers or APPs (Physician Assistants and Nurse Practitioners) who all work together to provide you with the care you need, when you need it.  Your next appointment:   5 month(s)  Provider:   Jerel Francyne, MD , first available with Dr [name]     The skin changes seen on your legs look similar to hemosiderin staining which can happen related to reflux of the veins in the legs. We would recommend a trial of compression socks. We will also touch base with Dr. Francyne to get his thoughts.          Signed, Miriam FORBES Shams, NP  10/10/2024 5:23 PM    [facility]

## 2024-10-10 ENCOUNTER — Ambulatory Visit: Attending: Physician Assistant | Admitting: Emergency Medicine

## 2024-10-10 ENCOUNTER — Encounter: Payer: Self-pay | Admitting: Physician Assistant

## 2024-10-10 VITALS — BP 130/82 | HR 74 | Ht 71.0 in | Wt 214.4 lb

## 2024-10-10 DIAGNOSIS — I480 Paroxysmal atrial fibrillation: Secondary | ICD-10-CM | POA: Insufficient documentation

## 2024-10-10 DIAGNOSIS — I2510 Atherosclerotic heart disease of native coronary artery without angina pectoris: Secondary | ICD-10-CM | POA: Diagnosis not present

## 2024-10-10 DIAGNOSIS — I10 Essential (primary) hypertension: Secondary | ICD-10-CM | POA: Insufficient documentation

## 2024-10-10 DIAGNOSIS — L819 Disorder of pigmentation, unspecified: Secondary | ICD-10-CM | POA: Insufficient documentation

## 2024-10-10 DIAGNOSIS — E785 Hyperlipidemia, unspecified: Secondary | ICD-10-CM | POA: Diagnosis present

## 2024-10-10 LAB — CBC
Hematocrit: 38.8 % (ref 37.5–51.0)
Hemoglobin: 13.2 g/dL (ref 13.0–17.7)
MCH: 34 pg — ABNORMAL HIGH (ref 26.6–33.0)
MCHC: 34 g/dL (ref 31.5–35.7)
MCV: 100 fL — ABNORMAL HIGH (ref 79–97)
Platelets: 159 x10E3/uL (ref 150–450)
RBC: 3.88 x10E6/uL — ABNORMAL LOW (ref 4.14–5.80)
RDW: 12.9 % (ref 11.6–15.4)
WBC: 4.2 x10E3/uL (ref 3.4–10.8)

## 2024-10-10 MED ORDER — CLOPIDOGREL BISULFATE 75 MG PO TABS: 75.0000 mg | ORAL_TABLET | Freq: Every day | ORAL | 1 refills | Status: AC

## 2024-10-10 NOTE — Assessment & Plan Note (Addendum)
 EKG: Atrial paced rhythm with prolonged AV conduction, right bundle branch block, 74 bpm Right radial site appears well-healed, no bruising/ecchymosis/hematoma/tenderness.  Radial pulse 2+ Reports a day or 2 following the procedure he had some mild chest soreness that resolved.  Since then he has felt much improved, symptoms prior to cath fully resolved.  Able to traverse 4-5 flights of stairs on vacation without return of cardiac symptoms. Denies CP, SOB, palpitations, near-syncope Reports he did miss 1 dose of aspirin  2 weeks ago, other than that has not missed any other doses of DAPT or OAC Continue amlodipine  5 mg daily Stop aspirin  81 mg on 10/14/2024, continue daily until then (2/2 OAC for PAF) Continue Plavix  75 mg daily Continue Imdur  60 mg daily Continue nitroglycerin  0.4 mg SL as needed for chest pain every 5 minutes (has not needed this) Continue Crestor  5 mg daily - previously intolerant to higher doses and LDL at goal Patient is okay to proceed with cardiac rehab

## 2024-10-10 NOTE — Progress Notes (Signed)
 I agree it looks like hemosiderin. It is more a cosmetic problem than a serious medical problem I think. Stopping the aspirin  may help a little bit.

## 2024-10-10 NOTE — Patient Instructions (Addendum)
 Medication Instructions:  Your physician recommends that you continue on your current medications as directed. Please refer to the Current Medication list given to you today.  *If you need a refill on your cardiac medications before your next appointment, please call your pharmacy*  Lab Work: Today- CBC If you have labs (blood work) drawn today and your tests are completely normal, you will receive your results only by: MyChart Message (if you have MyChart) OR A paper copy in the mail If you have any lab test that is abnormal or we need to change your treatment, we will call you to review the results.  Follow-Up: At [facility], you and your health needs are our priority.  As part of our continuing mission to provide you with exceptional heart care, our providers are all part of one team.  This team includes your primary Cardiologist (physician) and Advanced Practice Providers or APPs (Physician Assistants and Nurse Practitioners) who all work together to provide you with the care you need, when you need it.  Your next appointment:   5 month(s)  Provider:   [name], MD , first available with Dr [name]     The skin changes seen on your legs look similar to hemosiderin staining which can happen related to reflux of the veins in the legs. We would recommend a trial of compression socks. We will also touch base with Dr. Balding to get his thoughts.

## 2024-10-11 ENCOUNTER — Ambulatory Visit: Payer: Self-pay | Admitting: Emergency Medicine

## 2024-11-01 ENCOUNTER — Encounter: Payer: Self-pay | Admitting: Internal Medicine

## 2024-11-01 ENCOUNTER — Ambulatory Visit: Attending: Student in an Organized Health Care Education/Training Program | Admitting: Internal Medicine

## 2024-11-01 VITALS — BP 122/64 | HR 85 | Ht 71.0 in | Wt 213.8 lb

## 2024-11-01 DIAGNOSIS — I480 Paroxysmal atrial fibrillation: Secondary | ICD-10-CM | POA: Diagnosis present

## 2024-11-01 NOTE — Patient Instructions (Signed)

## 2024-11-01 NOTE — Progress Notes (Signed)
 HPI Mr. Balis returns today for folllowup. The patient is a very pleasant 68 yo man with a h/o CAD, s/p MI with preserved LV function, CHB, s/p PPM insertion. The patient denies anginal symptoms and sob. He had a PCI of an OM about 6 weeks ago. No syncope. He feels well. No palpitations, chest pain or sob. No edema. He has developed peripheral neuropathy.  No Known Allergies   Current Outpatient Medications  Medication Sig Dispense Refill   Acetylcarnitine HCl (ACETYL L-CARNITINE PO) Take 1,500 mg by mouth daily.     ALPHA LIPOIC ACID PO Take 600 mg by mouth daily.     amLODipine  (NORVASC ) 5 MG tablet Take 5 mg by mouth daily.     apixaban  (ELIQUIS ) 5 MG TABS tablet TAKE 1 TABLET(5 MG) BY MOUTH TWICE DAILY 180 tablet 1   cholecalciferol (VITAMIN D3) 25 MCG (1000 UNIT) tablet Take 1,000 Units by mouth daily.     clopidogrel  (PLAVIX ) 75 MG tablet Take 1 tablet (75 mg total) by mouth daily. 90 tablet 1   Coenzyme Q10 200 MG capsule Take 200 mg by mouth daily.     cyanocobalamin (VITAMIN B12) 1000 MCG tablet 1,000 mcg daily.     folic acid (FOLVITE) 1 MG tablet Take 1 mg by mouth daily.     Glucosamine HCl 1500 MG TABS Take 1,500 mg by mouth daily.     hydroxychloroquine (PLAQUENIL) 200 MG tablet Take 200 mg by mouth 2 (two) times daily.     isosorbide  mononitrate (IMDUR ) 60 MG 24 hr tablet Take 1 tablet (60 mg total) by mouth daily. 90 tablet 3   losartan -hydrochlorothiazide (HYZAAR) 100-12.5 MG tablet Take 1 tablet by mouth daily.     metFORMIN (GLUCOPHAGE-XR) 500 MG 24 hr tablet Take 1,000 mg by mouth 2 (two) times daily with a meal. (Patient taking differently: Take 500-1,000 mg by mouth See admin instructions. Take 500 mg in the morning and 1000 mg at bedtime)     methotrexate (RHEUMATREX) 2.5 MG tablet Take 2.5 mg by mouth as directed. Take 1 tablet for 5 days out of 7, weekly (Patient taking differently: Take 15 mg by mouth once a week.)     Multiple Vitamins-Minerals (ABC PLUS  SENIOR PO) Take 1 tablet by mouth 2 (two) times daily.     nitroGLYCERIN  (NITROSTAT ) 0.4 MG SL tablet DISSOLVE ONE TABLET UNDER TONGUE AS NEEDED FOR CHEST PAIN EVERY 5 MINUTES FOR 3 DOSES 75 tablet 3   nystatin cream (MYCOSTATIN) Apply 1 application  topically daily as needed for dry skin. Mix with triamcinolone     Omega-3 Fatty Acids (FISH OIL OMEGA-3 PO) Take 2 capsules by mouth 2 (two) times daily. 850 mg EPA 576 mg DHA     pantoprazole  (PROTONIX ) 40 MG tablet Take 40 mg by mouth daily.     Potassium 99 MG TABS Take 99 mg by mouth daily.     rosuvastatin  (CRESTOR ) 5 MG tablet Take 5 mg by mouth daily.     tamsulosin  (FLOMAX ) 0.4 MG CAPS capsule Take 2 capsules (0.8 mg total) by mouth daily. (Patient taking differently: Take 0.4 mg by mouth 2 (two) times daily.) 180 capsule 3   triamcinolone cream (KENALOG) 0.1 % Apply 1 application  topically daily as needed (rash). Mix with Nystatin     TRULICITY 1.5 MG/0.5ML SOAJ Inject 1.5 mg into the skin once a week.     vitamin C (ASCORBIC ACID) 500 MG tablet Take 500 mg  by mouth daily.     No current facility-administered medications for this visit.     Past Medical History:  Diagnosis Date   CAD in native artery    a. anterolateral STEMI 07/2017 s/p DES to prox-mid LAD, EF 55%.   GERD (gastroesophageal reflux disease)    History of kidney stones    Hyperlipidemia    Hypertension    Myocardial infarction [facility])    Overactive bladder    Presence of permanent cardiac pacemaker    Renal calculi    Rheumatoid arthritis (HCC)    Syncope    Uncontrolled diabetes mellitus     ROS:   All systems reviewed and negative except as noted in the HPI.   Past Surgical History:  Procedure Laterality Date   CORONARY CTO INTERVENTION N/A 09/14/2024   Procedure: CORONARY CTO INTERVENTION;  Surgeon: Wonda Sharper, MD;  Location: [facility] INVASIVE CV LAB;  Service: Cardiovascular;  Laterality: N/A;   CORONARY/GRAFT ACUTE MI REVASCULARIZATION N/A 08/04/2017    Procedure: Coronary/Graft Acute MI Revascularization;  Surgeon: Burnard Debby LABOR, MD;  Location[facility] LAB;  Service: Cardiovascular;  Laterality: N/A;   LEFT HEART CATH AND CORONARY ANGIOGRAPHY N/A 08/04/2017   Procedure: LEFT HEART CATH AND CORONARY ANGIOGRAPHY;  Surgeon: Burnard Debby LABOR, MD;  Location: [facility] INVASIVE CV LAB;  Service: Cardiovascular;  Laterality: N/A;   LEFT HEART CATH AND CORONARY ANGIOGRAPHY N/A 03/21/2018   Procedure: LEFT HEART CATH AND CORONARY ANGIOGRAPHY;  Surgeon: Burnard Debby LABOR, MD;  Location: [facility] INVASIVE CV LAB;  Service: Cardiovascular;  Laterality: N/A;   LEFT HEART CATH AND CORONARY ANGIOGRAPHY N/A 09/14/2024   Procedure: LEFT HEART CATH AND CORONARY ANGIOGRAPHY;  Surgeon: Wonda Sharper, MD;  Location: [facility] INVASIVE CV LAB;  Service: Cardiovascular;  Laterality: N/A;   PACEMAKER IMPLANT N/A 05/17/2022   Procedure: PACEMAKER IMPLANT;  Surgeon: [name] Danelle ORN, MD;  Location: [facility] CV LAB;  Service: Cardiovascular;  Laterality: N/A;   PROSTATE SURGERY     in office Dr Kassie   TEMPORARY PACEMAKER N/A 07/14/2021   Procedure: TEMPORARY PACEMAKER;  Surgeon: Anner Alm ORN, MD;  Location: [facility] INVASIVE CV LAB;  Service: Cardiovascular;  Laterality: N/A;   TONSILLECTOMY       Family History  Problem Relation Age of Onset   Dementia Mother    Heart disease Father    Heart attack Father    Alcohol abuse Brother    Stroke Daughter      Social History   Socioeconomic History   Marital status: Married    Spouse name: Not on file   Number of children: 3   Years of education: GED   Highest education level: Not on file  Occupational History   Occupation: Editor, commissioning  Tobacco Use   Smoking status: Former    Types: Cigarettes   Smokeless tobacco: Never   Tobacco comments:    Quit smoking at age 37.  Vaping Use   Vaping status: Never Used  Substance and Sexual Activity   Alcohol use: Yes    Alcohol/week: 2.0 standard drinks of alcohol     Types: 2 Shots of liquor per week   Drug use: No   Sexual activity: Not on file  Other Topics Concern   Not on file  Social History Narrative   Lives at home with his wife.   Right-handed.   Caffeine use: 3.5 cups per day.   Social Drivers of Health   Financial Resource Strain: Low Risk  (01/19/2024)  Received from [facility]   Overall Financial Resource Strain (CARDIA)    Difficulty of Paying Living Expenses: Not hard at all  Food Insecurity: No Food Insecurity (01/19/2024)   Received from [facility]   Hunger Vital Sign    Within the past 12 months, you worried that your food would run out before you got the money to buy more.: Never true    Within the past 12 months, the food you bought just didn't last and you didn't have money to get more.: Never true  Transportation Needs: No Transportation Needs (01/19/2024)   Received from [facility] - Transportation    In the past 12 months, has lack of transportation kept you from medical appointments or from getting medications?: No    Lack of Transportation (Non-Medical): No  Physical Activity: Not on file  Stress: Not on file  Social Connections: Not on file  Intimate Partner Violence: Not on file     BP 122/64   Pulse 85   Ht 5' 11 (1.803 m)   Wt 213 lb 12.8 oz (97 kg)   SpO2 95%   BMI 29.82 kg/m   Physical Exam:  Well appearing NAD HEENT: Unremarkable Neck:  No JVD, no thyromegally Lymphatics:  No adenopathy Back:  No CVA tenderness Lungs:  Clear with no wheezes HEART:  Regular rate rhythm, no murmurs, no rubs, no clicks Abd:  soft, positive bowel sounds, no organomegally, no rebound, no guarding Ext:  2 plus pulses, no edema, no cyanosis, no clubbing Skin:  No rashes no nodules Neuro:  CN II through XII intact, motor grossly intact   DEVICE  Normal device function.  See PaceArt for details.   Assess/Plan:  PAF - he has had a several hour  episode of PAF since his last visit and he will continue eliquis . He has stopped the asa and will continue plavix  6-12 months. CAD - he is s/p PCI of the OM 6 weeks ago. He denies anginal symptoms.  Dyslipidemia - he will continue his current meds.  4.  CHB s/p PPM - he is pacing about 6% in the V and 55% in the atrium. We will recheck in several months.  Danelle Alrick Cubbage,MD

## 2024-11-15 ENCOUNTER — Ambulatory Visit: Payer: Medicare Other

## 2024-11-15 DIAGNOSIS — I442 Atrioventricular block, complete: Secondary | ICD-10-CM | POA: Diagnosis not present

## 2024-11-17 LAB — CUP PACEART REMOTE DEVICE CHECK
Battery Voltage: 80
Date Time Interrogation Session: 20251126085526
Implantable Lead Connection Status: 753985
Implantable Lead Connection Status: 753985
Implantable Lead Implant Date: 20230528
Implantable Lead Implant Date: 20230528
Implantable Lead Location: 753859
Implantable Lead Location: 753860
Implantable Lead Model: 377169
Implantable Lead Model: 377171
Implantable Lead Serial Number: 8000368134
Implantable Lead Serial Number: 8000749513
Implantable Pulse Generator Implant Date: 20230528
Pulse Gen Model: 407145
Pulse Gen Serial Number: 70380012

## 2024-11-20 NOTE — Progress Notes (Signed)
 Remote PPM Transmission

## 2024-11-22 ENCOUNTER — Encounter: Payer: Self-pay | Admitting: Internal Medicine

## 2024-11-23 ENCOUNTER — Ambulatory Visit: Payer: Self-pay | Admitting: Internal Medicine

## 2024-12-03 NOTE — Progress Notes (Unsigned)
 12/07/2024 7:55 PM   Patrick [name] 12/27/1955 [id number]  Referring provider: [name] ORN, MD [address] [facility] [place],  [place] [address]  Urological history: 1. Elevated PSA - biopsy 2004 iPSA 6.8 negative - biopsy 2008 iPSA 6.3 negative   2. BPH with LU TS - PSA (10/2024) 4.51 -~15 years ago TUMT - Managed with tamsulosin  0.4 mg twice daily   3. Pelvic floor dysfunction - documented increased EMG activity with all voids from urodynamics in 2018 - underwent PT   4. Nocturia - managed with Myrbetriq  25 mg daily - not interested in sleep study   5. ED -contributing factors of age, CAD, diabetes, HLD, HTN and former smoker -not a candidate for PDE5i's secondary to nitrate use -failed ICI -using alternative measures for sexual activity    6. Nephrolithiasis -stone composition 100% calcium  oxalate -spontaneous passage of 8 mm left UVJ stone (08/2022)   No chief complaint on file.  HPI: Patrick [name] is a 68 y.o. male who presents today for follow up. 6 month follow up.    Previous records reviewed.     I PSS ***  He reports sensation of incomplete bladder emptying,   urinary frequency,   urinary intermittency,   urinary urgency,   a weak urinary stream,   having to strain to void,   nocturia x ***,   leaking before being able to reach the restroom,   leaking with coughing,   leaking without awareness,   and post void dribbling.     He is wearing *** pads//depends  daily.    Patient denies any modifying or aggravating factors.  Patient denies any recent UTI's, gross hematuria, dysuria or suprapubic/flank pain.  Patient denies any fevers, chills, nausea or vomiting.  ***  He has a family history of PCa, colon cancer, ovarian cancer and/or breast cancer with ***.   He does not have a family history of PCa, colon cancer, ovarian cancer, and/or breast cancer .***     UA***  PVR***  PSA (10/2024) 4.51  Serum  creatinine (10/2024) 1.1   Hemoglobin A1c (10/2024) 5.7   Diuretics: hydrochlorothiazide   BPH meds: tamsulosin  0.8 mg daily   Fluid consumption: ***    PMH: Past Medical History:  Diagnosis Date   CAD in native artery    a. anterolateral STEMI 07/2017 s/p DES to prox-mid LAD, EF 55%.   GERD (gastroesophageal reflux disease)    History of kidney stones    Hyperlipidemia    Hypertension    Myocardial infarction [facility])    Overactive bladder    Presence of permanent cardiac pacemaker    Renal calculi    Rheumatoid arthritis (HCC)    Syncope    Uncontrolled diabetes mellitus     Surgical History: Past Surgical History:  Procedure Laterality Date   CORONARY CTO INTERVENTION N/A 09/14/2024   Procedure: CORONARY CTO INTERVENTION;  Surgeon: Wonda Sharper, MD;  Location: [facility] INVASIVE CV LAB;  Service: Cardiovascular;  Laterality: N/A;   CORONARY/GRAFT ACUTE MI REVASCULARIZATION N/A 08/04/2017   Procedure: Coronary/Graft Acute MI Revascularization;  Surgeon: Burnard Debby LABOR, MD;  Location[facility] CV LAB;  Service: Cardiovascular;  Laterality: N/A;   LEFT HEART CATH AND CORONARY ANGIOGRAPHY N/A 08/04/2017   Procedure: LEFT HEART CATH AND CORONARY ANGIOGRAPHY;  Surgeon: Burnard Debby LABOR, MD;  Location[facility] INVASIVE CV LAB;  Service: Cardiovascular;  Laterality: N/A;   LEFT HEART CATH AND CORONARY ANGIOGRAPHY N/A 03/21/2018  Procedure: LEFT HEART CATH AND CORONARY ANGIOGRAPHY;  Surgeon: Burnard Debby LABOR, MD;  Location: [facility] INVASIVE CV LAB;  Service: Cardiovascular;  Laterality: N/A;   LEFT HEART CATH AND CORONARY ANGIOGRAPHY N/A 09/14/2024   Procedure: LEFT HEART CATH AND CORONARY ANGIOGRAPHY;  Surgeon: Wonda Sharper, MD;  Location: [facility] INVASIVE CV LAB;  Service: Cardiovascular;  Laterality: N/A;   PACEMAKER IMPLANT N/A 05/17/2022   Procedure: PACEMAKER IMPLANT;  Surgeon: [name] Danelle ORN, MD;  Location: [facility] INVASIVE CV LAB;  Service: Cardiovascular;  Laterality: N/A;   PROSTATE SURGERY      in office Dr Kassie   TEMPORARY PACEMAKER N/A 07/14/2021   Procedure: TEMPORARY PACEMAKER;  Surgeon: Anner Alm ORN, MD;  Location: [facility] INVASIVE CV LAB;  Service: Cardiovascular;  Laterality: N/A;   TONSILLECTOMY      Home Medications:  Allergies as of 12/07/2024   No Known Allergies      Medication List        Accurate as of December 03, 2024  7:55 PM. If you have any questions, ask your nurse or doctor.          ABC PLUS SENIOR PO Take 1 tablet by mouth 2 (two) times daily.   ACETYL L-CARNITINE PO Take 1,500 mg by mouth daily.   ALPHA LIPOIC ACID PO Take 600 mg by mouth daily.   amLODipine  5 MG tablet Commonly known as: NORVASC  Take 5 mg by mouth daily.   ascorbic acid 500 MG tablet Commonly known as: VITAMIN C Take 500 mg by mouth daily.   cholecalciferol 25 MCG (1000 UNIT) tablet Commonly known as: VITAMIN D3 Take 1,000 Units by mouth daily.   clopidogrel  75 MG tablet Commonly known as: Plavix  Take 1 tablet (75 mg total) by mouth daily.   Coenzyme Q10 200 MG capsule Take 200 mg by mouth daily.   cyanocobalamin 1000 MCG tablet Commonly known as: VITAMIN B12 1,000 mcg daily.   Eliquis  5 MG Tabs tablet Generic drug: apixaban  TAKE 1 TABLET(5 MG) BY MOUTH TWICE DAILY   FISH OIL OMEGA-3 PO Take 2 capsules by mouth 2 (two) times daily. 850 mg EPA 576 mg DHA   folic acid 1 MG tablet Commonly known as: FOLVITE Take 1 mg by mouth daily.   Glucosamine HCl 1500 MG Tabs Take 1,500 mg by mouth daily.   hydroxychloroquine 200 MG tablet Commonly known as: PLAQUENIL Take 200 mg by mouth 2 (two) times daily.   isosorbide  mononitrate 60 MG 24 hr tablet Commonly known as: IMDUR  Take 1 tablet (60 mg total) by mouth daily.   losartan -hydrochlorothiazide 100-12.5 MG tablet Commonly known as: HYZAAR Take 1 tablet by mouth daily.   metFORMIN 500 MG 24 hr tablet Commonly known as: GLUCOPHAGE-XR Take 1,000 mg by mouth 2 (two) times daily with a  meal. What changed:  how much to take when to take this additional instructions   methotrexate 2.5 MG tablet Commonly known as: RHEUMATREX Take 2.5 mg by mouth as directed. Take 1 tablet for 5 days out of 7, weekly What changed:  how much to take when to take this additional instructions   nitroGLYCERIN  0.4 MG SL tablet Commonly known as: NITROSTAT  DISSOLVE ONE TABLET UNDER TONGUE AS NEEDED FOR CHEST PAIN EVERY 5 MINUTES FOR 3 DOSES   nystatin cream Commonly known as: MYCOSTATIN Apply 1 application  topically daily as needed for dry skin. Mix with triamcinolone   pantoprazole  40 MG tablet Commonly known as: PROTONIX  Take 40 mg by mouth daily.  Potassium 99 MG Tabs Take 99 mg by mouth daily.   rosuvastatin  5 MG tablet Commonly known as: CRESTOR  Take 5 mg by mouth daily.   tamsulosin  0.4 MG Caps capsule Commonly known as: FLOMAX  Take 2 capsules (0.8 mg total) by mouth daily. What changed:  how much to take when to take this   triamcinolone cream 0.1 % Commonly known as: KENALOG Apply 1 application  topically daily as needed (rash). Mix with Nystatin   Trulicity 1.5 MG/0.5ML Soaj Generic drug: Dulaglutide Inject 1.5 mg into the skin once a week.        Allergies: No Known Allergies  Family History: Family History  Problem Relation Age of Onset   Dementia Mother    Heart disease Father    Heart attack Father    Alcohol abuse Brother    Stroke Daughter     Social History:  reports that he has quit smoking. His smoking use included cigarettes. He has never used smokeless tobacco. He reports current alcohol use of about 2.0 standard drinks of alcohol per week. He reports that he does not use drugs.  ROS: Pertinent ROS in HPI  Physical Exam: There were no vitals taken for this visit.  Constitutional:  Well nourished. Alert and oriented, No acute distress. HEENT: [place] AT, moist mucus membranes.  Trachea midline, no masses. Cardiovascular: No clubbing,  cyanosis, or edema. Respiratory: Normal respiratory effort, no increased work of breathing. GI: Abdomen is soft, non tender, non distended, no abdominal masses. Liver and spleen not palpable.  No hernias appreciated.  Stool sample for occult testing is not indicated.   GU: No CVA tenderness.  No bladder fullness or masses.  Patient with circumcised/uncircumcised phallus. ***Foreskin easily retracted***  Urethral meatus is patent.  No penile discharge. No penile lesions or rashes. Scrotum without lesions, cysts, rashes and/or edema.  Testicles are located scrotally bilaterally. No masses are appreciated in the testicles. Left and right epididymis are normal. Rectal: Patient with  normal sphincter tone. Anus and perineum without scarring or rashes. No rectal masses are appreciated. Prostate is approximately *** grams, *** nodules are appreciated. Seminal vesicles are normal. Skin: No rashes, bruises or suspicious lesions. Lymph: No cervical or inguinal adenopathy. Neurologic: Grossly intact, no focal deficits, moving all 4 extremities. Psychiatric: Normal mood and affect.   Laboratory data See HPI and EPIC I have reviewed the labs.   Pertinent Imaging: N/A   Assessment & Plan:    1. Elevated PSA - PSA remains stable at 4.51   2. 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 TS - mild, moderate severe symptoms *** and he is *** - no signs of retention, infection or malignancy *** - PSA up to date  - DRE benign *** - UA benign *** - PVR < 300 cc *** - most bothersome symptoms are *** - encouraged avoiding bladder irritants, fluid restriction before bedtime and timed voiding's - Initiate alpha-blocker (***), discussed side effects *** - Initiate 5 alpha reductase inhibitor (***), discussed side effects *** - Continue tamsulosin  0.4 mg daily, alfuzosin 10 mg daily, Rapaflo 8 mg daily, terazosin, doxazosin, Cialis 5 mg daily and finasteride 5 mg daily, dutasteride 0.5 mg daily***:refills given - Cannot tolerate  medication or medication failure, schedule cystoscopy *** - educated on red flag symptoms: acute retention, gross hematuria, fever, severe pain - advised to call clinic or go to the ED if these occur - return to clinic in *** symptom re-evaluation ***    No follow-ups on file.  These notes  generated with voice recognition software. I apologize for typographical errors.  CLOTILDA HELON RIGGERS  [facility] [address]  [address] [place], [place] [address] [phone]

## 2024-12-05 ENCOUNTER — Other Ambulatory Visit: Payer: Self-pay

## 2024-12-05 DIAGNOSIS — R972 Elevated prostate specific antigen [PSA]: Secondary | ICD-10-CM

## 2024-12-06 ENCOUNTER — Other Ambulatory Visit: Payer: Self-pay

## 2024-12-06 DIAGNOSIS — R972 Elevated prostate specific antigen [PSA]: Secondary | ICD-10-CM

## 2024-12-07 ENCOUNTER — Encounter: Payer: Self-pay | Admitting: Urology

## 2024-12-07 ENCOUNTER — Ambulatory Visit: Payer: Self-pay | Admitting: Urology

## 2024-12-07 VITALS — BP 134/75 | HR 92 | Ht 71.0 in | Wt 207.0 lb

## 2024-12-07 DIAGNOSIS — N138 Other obstructive and reflux uropathy: Secondary | ICD-10-CM | POA: Diagnosis not present

## 2024-12-07 DIAGNOSIS — R972 Elevated prostate specific antigen [PSA]: Secondary | ICD-10-CM

## 2024-12-07 DIAGNOSIS — N401 Enlarged prostate with lower urinary tract symptoms: Secondary | ICD-10-CM | POA: Diagnosis not present

## 2024-12-07 LAB — URINALYSIS, COMPLETE
Bilirubin, UA: NEGATIVE
Glucose, UA: NEGATIVE
Leukocytes,UA: NEGATIVE
Nitrite, UA: NEGATIVE
Protein,UA: NEGATIVE
RBC, UA: NEGATIVE
Specific Gravity, UA: 1.03 (ref 1.005–1.030)
Urobilinogen, Ur: 0.2 mg/dL (ref 0.2–1.0)
pH, UA: 6 (ref 5.0–7.5)

## 2024-12-07 LAB — BLADDER SCAN AMB NON-IMAGING

## 2024-12-07 LAB — PSA: Prostate Specific Ag, Serum: 5.3 ng/mL — ABNORMAL HIGH (ref 0.0–4.0)

## 2024-12-07 LAB — MICROSCOPIC EXAMINATION

## 2024-12-09 LAB — PHI SCORE REFLEX
% Free PSA: 25.9 %
PSA, Free: 1.3 ng/mL
Prostate Heath Index Score: 31.4
p2PSA: 18.2 pg/mL

## 2024-12-09 LAB — PROSTATE HEALTH INDEX: Prostate Specific Ag: 5 ng/mL — ABNORMAL HIGH (ref 0.0–3.9)

## 2024-12-10 ENCOUNTER — Ambulatory Visit: Payer: Self-pay | Admitting: Urology

## 2024-12-11 ENCOUNTER — Other Ambulatory Visit: Payer: Medicare Other

## 2024-12-12 ENCOUNTER — Other Ambulatory Visit: Payer: Self-pay

## 2024-12-12 ENCOUNTER — Encounter: Payer: Self-pay | Admitting: Cardiovascular Disease

## 2024-12-12 DIAGNOSIS — N138 Other obstructive and reflux uropathy: Secondary | ICD-10-CM

## 2024-12-12 MED ORDER — TAMSULOSIN HCL 0.4 MG PO CAPS: 0.4000 mg | ORAL_CAPSULE | Freq: Two times a day (BID) | ORAL | 3 refills | Status: AC

## 2024-12-25 ENCOUNTER — Telehealth: Payer: Self-pay

## 2024-12-25 ENCOUNTER — Telehealth (HOSPITAL_BASED_OUTPATIENT_CLINIC_OR_DEPARTMENT_OTHER): Payer: Self-pay

## 2024-12-25 ENCOUNTER — Encounter: Payer: Self-pay | Admitting: Cardiovascular Disease

## 2024-12-25 NOTE — Telephone Encounter (Signed)
"  ° °  Pre-operative Risk Assessment    Patient Name: Patrick [name]  DOB: [birth year] MRN: [id number]   Date of last office visit: 11/01/2024 with Dr. [name] Date of next office visit: 03/05/2025 with Dr. Francyne  Request for Surgical Clearance    Procedure:  Prostate Biopsy  Date of Surgery:  Clearance 01/03/25                                 Surgeon:  Does not specify Surgeon's Group or Practice Name:  [facility]  Phone number:  [phone] Fax number:  [phone]   Type of Clearance Requested:   - Medical  - Pharmacy:  Hold Clopidogrel  (Plavix ) and Apixaban  (Eliquis ) -Stop 7 days prior to procedure   Type of Anesthesia:  Does not specify   Additional requests/questions:  PT HAS BIOTRONIC PPM  Signed, Patrcia Hong L   12/25/2024, 11:18 AM   "

## 2024-12-25 NOTE — Telephone Encounter (Signed)
 Called patient to confirm patient was taking both Eliquis  and Plavix  blood thinners which pt confirmed. I faxed Cardio Clearance sheet for approval of stopping blood thinners 7 days prior for his prostate biopsy to Dr.Croitoru. Pt wanted to double check where the biopsy was located at I explained it was at our [place] location. Pt had no additional questions or concerns.-Karess Harner,CMA.

## 2024-12-25 NOTE — Telephone Encounter (Signed)
" ° °  Name:  Patrick [name]  DOB:  [birth year]  MRN:  [id number]   Primary Cardiologist: [name], MD  Chart reviewed as part of pre-operative protocol coverage. Patient was contacted 12/25/2024 in reference to pre-operative risk assessment for pending surgery as outlined below.  Patrick [name] was last seen on 11/01/2024 by Dr. [name] and by Dr.Cooper for heart catheterization on 09/14/2024 which included percutaneous coronary intervention ( PCI) of his obtuse marginal of the circumflex coronary artery.    Since that day, Patrick [name] has done well but will require 6 months of uninterrupted Plavix  (March 2026 soonest he can stop). Concerning Eliquis  hold, will need to have pharmacy make recommendations closer to the surgery date in March. Will also reach out to Device clinic at that time.   Due to new or worsening symptoms, Patrick [name] will require a follow-up visit for further pre-operative risk assessment.  Pre-op  covering staff: - Please schedule appointment and call patient to inform them. If patient already had an upcoming appointment within acceptable timeframe, please add pre-op  clearance to the appointment notes so provider is aware. - Please contact requesting surgeon's office via preferred method (i.e, phone, fax) to inform them of need for appointment prior to surgery.  Lamarr Satterfield, NP 12/25/2024, 11:27 AM    "

## 2024-12-25 NOTE — Progress Notes (Signed)
 PERIOPERATIVE PRESCRIPTION FOR IMPLANTED CARDIAC DEVICE PROGRAMMING  Patient Information: Name:  Patrick [name]  DOB:  Nov 26, 1956  MRN:  [id number]  Procedure:  Prostate Biopsy   Date of Surgery:  Clearance 01/03/25                                  Surgeon:  Does not specify Surgeon's Group or Practice Name:  [facility]  Phone number:  [phone] Fax number:  [phone]   Type of Clearance Requested:   - Medical  - Pharmacy:  Hold Clopidogrel  (Plavix ) and Apixaban  (Eliquis ) -Stop 7 days prior to procedure   Type of Anesthesia:  Does not specify Device Information:  Clinic EP Physician:  Danelle Birmingham, MD   Device Type:  Pacemaker Manufacturer and Phone #:  [employer]: [phone] Pacemaker Dependent?:  No. Date of Last Device Check:  11/15/24 Normal Device Function?:  Yes.    Electrophysiologist's Recommendations:  Have magnet available. Provide continuous ECG monitoring when magnet is used or reprogramming is to be performed.  Procedure should not interfere with device function.  No device programming or magnet placement needed.  Per Device Clinic Standing Orders, [name], RN  5:30 PM 12/25/2024

## 2024-12-26 DIAGNOSIS — R972 Elevated prostate specific antigen [PSA]: Secondary | ICD-10-CM

## 2024-12-26 DIAGNOSIS — N401 Enlarged prostate with lower urinary tract symptoms: Secondary | ICD-10-CM

## 2024-12-26 MED ORDER — TAMSULOSIN HCL 0.4 MG PO CAPS: 0.8000 mg | ORAL_CAPSULE | Freq: Every day | ORAL | 3 refills | Status: AC

## 2024-12-27 ENCOUNTER — Ambulatory Visit
Admission: RE | Admit: 2024-12-27 | Discharge: 2024-12-27 | Disposition: A | Discharge status: Home / Self Care | Source: Ambulatory Visit | Attending: Urology | Admitting: Urology

## 2024-12-27 ENCOUNTER — Ambulatory Visit: Admission: RE | Admit: 2024-12-27 | Source: Home / Self Care

## 2024-12-27 ENCOUNTER — Telehealth: Payer: Self-pay | Admitting: Urology

## 2024-12-27 ENCOUNTER — Other Ambulatory Visit: Payer: Self-pay | Admitting: Urology

## 2024-12-27 DIAGNOSIS — N200 Calculus of kidney: Secondary | ICD-10-CM | POA: Diagnosis present

## 2024-12-27 DIAGNOSIS — R310 Gross hematuria: Secondary | ICD-10-CM | POA: Diagnosis present

## 2024-12-27 NOTE — Telephone Encounter (Signed)
 Patrick [name] had some episodes of gross hematuria about a week ago and he is having urinary urgency and frequency.  He does have a history of kidney stones, so I placed orders for a KUB and he will get 1 this afternoon.  We will hold on prostate MRI at this time as his elevated PSA and symptoms may be due to a stone.

## 2024-12-27 NOTE — Telephone Encounter (Signed)
 PT stopped by to let you know he had x-ray done.

## 2025-01-01 ENCOUNTER — Ambulatory Visit
Admission: RE | Admit: 2025-01-01 | Discharge: 2025-01-01 | Disposition: A | Discharge status: Home / Self Care | Source: Ambulatory Visit | Attending: Urology | Admitting: Urology

## 2025-01-01 DIAGNOSIS — R310 Gross hematuria: Secondary | ICD-10-CM | POA: Insufficient documentation

## 2025-01-01 LAB — POCT I-STAT CREATININE: Creatinine, Ser: 1.2 mg/dL (ref 0.61–1.24)

## 2025-01-01 MED ORDER — IOHEXOL 300 MG/ML  SOLN
100.0000 mL | Freq: Once | INTRAMUSCULAR | Status: AC | PRN
  Administered 2025-01-01: 100 mL via INTRAVENOUS

## 2025-01-03 ENCOUNTER — Other Ambulatory Visit: Admitting: Urology

## 2025-01-03 ENCOUNTER — Other Ambulatory Visit

## 2025-01-05 ENCOUNTER — Ambulatory Visit: Payer: Self-pay | Admitting: Urology

## 2025-01-09 NOTE — H&P (View-Only) (Signed)
 Patrick [name] is a 69 y.o. male here for    CHIEF COMPLAINT:    Patient Active Problem List  Diagnosis   Hypertensive kidney disease with CKD stage III (CMS-HCC)   Hyperlipidemia associated with type 2 diabetes mellitus (CMS/HHS-HCC)   Diabetes mellitus with stage 3 chronic kidney disease (CMS/HHS-HCC)   UI (urinary incontinence)   Health care maintenance   Coronary artery disease involving native coronary artery of native heart without angina pectoris   AV block, 3rd degree (CMS/HHS-HCC)   Low serum vitamin B12   Rheumatoid arthritis involving multiple sites with positive rheumatoid factor (CMS/HHS-HCC)   Primary osteoarthritis of both hands   Long-term use of immunosuppressant medication     HISTORY OF PRESENT ILLNESS:  Patrick [name] complains of diarrhea, chronic, no help yet on decreased metformin and carbs seem to exacerbate  Past Medical History:  Diagnosis Date   Abnormal LFTs    with normal workup. Felt to be due to fatty liver   BPH (benign prostatic hypertrophy)    minimal symptoms   GERD (gastroesophageal reflux disease)    Hyperlipidemia    IBS (irritable bowel syndrome)    Osteoarthritis    hands    Past Surgical History:  Procedure Laterality Date   EGD  01/11/2003   COLONOSCOPY  01/11/2003   Hyperplastic Polyp   COLONOSCOPY  01/11/2009   Small polyp removed but not retrieved    COLONOSCOPY  03/09/2014   Hyperpalstic Polyp: CBF 02/2019: Recall ltr mailed    INSERTION SINGLE CHAMBER PACEMAKER GENERATOR  08/2022   Thermotherapy for BPH     TONSILLECTOMY       No fever chills or sweats   No nausea, vomiting or diarrhea  No chest pain, shortness of breath   Social History   Socioeconomic History   Marital status: Married  Tobacco Use   Smoking status: Former    Current packs/day: 0.00    Types: Cigarettes    Start date: 01/06/1968    Quit date: 01/05/1974    Years since quitting: 51.0    Passive exposure: Past    Smokeless tobacco: Never  Vaping Use   Vaping status: Never Used  Substance and Sexual Activity   Alcohol use: Yes    Comment: occassionally   Drug use: Not Currently   Sexual activity: Yes    Partners: Female   Social Drivers of Health   Financial Resource Strain: Low Risk  (11/23/2024)   Overall Financial Resource Strain (CARDIA)    Difficulty of Paying Living Expenses: Not hard at all  Food Insecurity: No Food Insecurity (11/23/2024)   Hunger Vital Sign    Worried About Programme Researcher, Broadcasting/film/video in the Last Year: Never true    Ran Out of Food in the Last Year: Never true  Transportation Needs: No Transportation Needs (11/23/2024)   PRAPARE - Administrator, Civil Service (Medical): No    Lack of Transportation (Non-Medical): No  Housing Stability: Low Risk  (11/23/2024)   Housing Stability Vital Sign    Unable to Pay for Housing in the Last Year: No    Number of Times Moved in the Last Year: 0    Homeless in the Last Year: No      Current Outpatient Medications:    alpha lipoic acid 600 mg Cap capsule, Take 1 capsule by mouth once daily, Disp: , Rfl:    amLODIPine  (NORVASC ) 5 MG tablet, TAKE 1 TABLET(5 MG) BY MOUTH EVERY DAY, Disp:  90 tablet, Rfl: 3   blood glucose diagnostic (CONTOUR NEXT TEST STRIPS) test strip, Use once daily Dx E11.22, Disp: 100 strip, Rfl: 3   clopidogreL  (PLAVIX ) 75 mg tablet, Take 75 mg by mouth once daily, Disp: , Rfl:    co-enzyme Q-10, ubiquinone, 200 mg capsule, Take 200 mg by mouth once daily, Disp: , Rfl:    CONTOUR NEXT METER Misc, USE UTD, Disp: , Rfl: 0   cyanocobalamin (VITAMIN B12) 100 MCG tablet, Take 1,000 mcg by mouth once daily, Disp: , Rfl:    ELIQUIS  5 mg tablet, Take 5 mg by mouth every 12 (twelve) hours, Disp: , Rfl:    folic acid (FOLVITE) 1 MG tablet, Take 1 tablet (1 mg total) by mouth once daily, Disp: 90 tablet, Rfl: 3   glucosamine HCl 1,500 mg Tab, Take 1,500 mg by mouth once daily, Disp: , Rfl:     hydroxychloroquine (PLAQUENIL) 200 mg tablet, Take 1 tablet (200 mg total) by mouth 2 (two) times daily, Disp: 180 tablet, Rfl: 1   isosorbide  mononitrate (IMDUR ) 60 MG ER tablet, Take 60 mg by mouth once daily, Disp: , Rfl:    levocarnitine HCl (ACETYL-L-CARNITINE MISC), Take 1,500 mg by mouth once daily, Disp: , Rfl:    losartan -hydroCHLOROthiazide (HYZAAR) 100-12.5 mg tablet, TAKE 1 TABLET BY MOUTH EVERY DAY, Disp: 90 tablet, Rfl: 3   metFORMIN (GLUCOPHAGE-XR) 500 MG XR tablet, TAKE 4 TABLETS(2000 MG) BY MOUTH DAILY WITH DINNER, Disp: 360 tablet, Rfl: 1   methotrexate (RHEUMATREX) 2.5 MG tablet, Take 6 tablets (15 mg total) by mouth every 7 (seven) days, Disp: 72 tablet, Rfl: 1   multivitamin tablet, Take 1 tablet by mouth once daily, Disp: , Rfl:    nitroGLYcerin  (NITROSTAT ) 0.4 MG SL tablet, Place under the tongue, Disp: , Rfl:    nystatin (MYCOSTATIN) 100,000 unit/gram cream, APPLY TO THE AFFECTED AREA TWICE DAILY UNTIL REDNESS IS CLEAR, Disp: , Rfl:    omega 3-dha-epa-fish oil 500-1,000 mg Cap, Take 2 capsules by mouth 2 (two) times daily   , Disp: , Rfl:    pantoprazole  (PROTONIX ) 40 MG DR tablet, TAKE 1 TABLET BY MOUTH EVERY DAY, Disp: 90 tablet, Rfl: 3   potassium 99 mg Tab, Take 99 mg by mouth once daily, Disp: , Rfl:    rosuvastatin  (CRESTOR ) 5 MG tablet, TAKE 1 TABLET(5 MG) BY MOUTH DAILY, Disp: 90 tablet, Rfl: 1   tamsulosin  (FLOMAX ) 0.4 mg capsule, Take 0.4 mg by mouth once daily Take 30 minutes after same meal each day., Disp: , Rfl:    triamcinolone 0.1 % cream, , Disp: , Rfl:    TRULICITY 1.5 mg/0.5 mL subcutaneous pen injector, ADMINISTER 1.5 MG UNDER THE SKIN EVERY 7 DAYS, Disp: 2 mL, Rfl: 2   VITAMIN D3 ORAL, Take 25 mcg by mouth once daily (Patient not taking: Reported on 01/09/2025), Disp: , Rfl:   Vitals:   01/09/25 0815  BP: 133/78  Pulse: 70   Body mass index is 30.54 kg/m. No acute distress HEENT: Normocephalic atraumatic. TM's clear. Op clear.   Lungs; clear to ascultation Heart; Regular rate and rhythm  Abdomen; Soft and flat, normal bowel sounds Extremities; No clubbing, cyanosis or edema  Appointment on 11/09/2024  Component Date Value Ref Range Status   Glucose 11/09/2024 116 (H)  70 - 110 mg/dL Final   Sodium 88/79/7974 141  136 - 145 mmol/L Final   Potassium 11/09/2024 4.0  3.6 - 5.1 mmol/L Final   Chloride 11/09/2024 107  97 - 109 mmol/L Final   Carbon Dioxide (CO2) 11/09/2024 25.7  22.0 - 32.0 mmol/L Final   Urea Nitrogen (BUN) 11/09/2024 22  7 - 25 mg/dL Final   Creatinine 88/79/7974 1.1  0.7 - 1.3 mg/dL Final   Glomerular Filtration Rate (eGFR) 11/09/2024 72  >60 mL/min/1.73sq m Final   Calcium  11/09/2024 9.3  8.7 - 10.3 mg/dL Final   AST  88/79/7974 18  8 - 39 U/L Final   ALT  11/09/2024 24  6 - 57 U/L Final   Alk Phos (alkaline Phosphatase) 11/09/2024 45  34 - 104 U/L Final   Albumin 11/09/2024 4.5  3.5 - 4.8 g/dL Final   Bilirubin, Total 11/09/2024 0.5  0.3 - 1.2 mg/dL Final   Protein, Total 11/09/2024 5.8 (L)  6.1 - 7.9 g/dL Final   A/G Ratio 88/79/7974 3.5  1.0 - 5.0 gm/dL Final   Hemoglobin J8R 11/09/2024 5.7 (H)  4.2 - 5.6 % Final   Average Blood Glucose (Calc) 11/09/2024 117  mg/dL Final   Cholesterol, Total 11/09/2024 90 (L)  100 - 200 mg/dL Final   Triglyceride 88/79/7974 96  35 - 199 mg/dL Final   HDL (High Density Lipoprotein) Cho* 11/09/2024 36.3  29.0 - 71.0 mg/dL Final   LDL Calculated 11/09/2024 35  0 - 130 mg/dL Final   VLDL Cholesterol 11/09/2024 19  mg/dL Final   Cholesterol/HDL Ratio 11/09/2024 2.5   Final   Creatinine, Random Urine 11/09/2024 200.2  40.0 - 300.0 mg/dL Final   Urine Albumin, Random 11/09/2024 15    mg/L Final   Urine Albumin/Creatinine Ratio 11/09/2024 7.5  <30.0 ug/mg Final   PSA (Prostate Specific Antigen), T* 11/09/2024 4.51 (H)  0.10 - 4.00 ng/mL Final    ASSESSMENT  AND PLAN:  Diagnoses and all orders for this visit:  Chronic  diarrhea -     Celiac Disease Panel - [facility]   Check about and keep titrating down metformin, 1000 now then 500 less q week or two

## 2025-01-10 ENCOUNTER — Ambulatory Visit: Admitting: Urology

## 2025-01-11 NOTE — Progress Notes (Unsigned)
" ° °  01/15/2025 7:28 AM   Patrick [name] 1956-09-09 [id number]  Cystoscopy Procedure Note:  Indication:  Hx of GH w/ LUTS  - BPH s/p prior TUMT ~2010  - hx of elevated PSA ~5, s/p x2 prior neg biopsies  After informed consent and discussion of the procedure and its risks, Patrick [name] was positioned and prepped in the standard fashion. Cystoscopy was performed with a flexible cystoscope. The urethra, bladder neck and bladder mucosa were visualized in a systematic fashion. The ureteral orifices were noted in orthotopic location and orientation. There were no bladder mucosal lesions, stones, debris or anatomic variants noted. The prostate gland was {bglistcystoprostatefindings:[id number]}.  Imaging: Recent CTU  reviewed (01/01/25) -  IMPRESSION: 1. 1 mm nonobstructing stone in the interpolar right kidney. No other urinary stone disease evident. No secondary changes in either kidney or ureter. 2. Prostatomegaly. 3. 16 mm low-density lesion in the upper pole left kidney shows slight increase in attenuation after IV contrast administration. While likely a cyst with pseudo enhancement, low-grade enhancement in a soft tissue lesion is not entirely excluded. Continued CT follow-up to ensure stability or MRI of the abdomen with and without contrast to further characterize recommended . 4. Mild circumferential bladder wall thickening and trabeculation. No focal bladder wall abnormality evident. 5. Cholelithiasis. 6. Bilateral groin hernias. The appendix is contained within the right groin hernia. Left groin hernia contains only fat. 7.  Aortic Atherosclerosis (ICD10-I70.0).  Findings: ***  Assessment and Plan: ***  Patrick Skye, MD 01/11/2025   "

## 2025-01-15 ENCOUNTER — Other Ambulatory Visit: Admitting: Urology

## 2025-01-16 ENCOUNTER — Telehealth: Payer: Self-pay | Admitting: Urology

## 2025-01-16 NOTE — Telephone Encounter (Signed)
 Would you get him scheduled for a cystoscopy with Dr. Georganne for gross heme?  We need to get it scheduled before 02/10.

## 2025-01-17 ENCOUNTER — Ambulatory Visit: Admitting: Urology

## 2025-01-17 ENCOUNTER — Other Ambulatory Visit: Payer: Self-pay

## 2025-01-17 ENCOUNTER — Telehealth: Payer: Self-pay

## 2025-01-17 VITALS — BP 120/78 | HR 87 | Ht 71.0 in | Wt 207.0 lb

## 2025-01-17 DIAGNOSIS — R310 Gross hematuria: Secondary | ICD-10-CM

## 2025-01-17 DIAGNOSIS — N211 Calculus in urethra: Secondary | ICD-10-CM

## 2025-01-17 NOTE — Progress Notes (Signed)
 Surgical Physician Order [facility] [place]  * Scheduling expectation : 01/18/25 Dr. Twylla  *Length of Case: 30 min  *Clearance needed: no  *Anticoagulation Instructions: May continue all anticoagulants  *Aspirin  Instructions: Ok to continue all  *Post-op visit Date/Instructions:  1 month follow up  *Diagnosis: urethral stone  *Procedure: cystoscopy, urethral stone extraction, possible laser lithotripsy   Additional orders: N/A  -Admit type: OUTpatient  -Anesthesia: MAC  -VTE Prophylaxis Standing Order SCD's       Other:   -Standing Lab Orders Per Anesthesia    Lab other: None  -Standing Test orders EKG/Chest x-ray per Anesthesia       Test other:   - Medications:  Ancef  2gm IV  -Other orders:  N/A

## 2025-01-17 NOTE — Progress Notes (Signed)
" ° °  [facility] Surgical Posting Form  Surgery Date: Date: 01/26/2025  Surgeon: Dr. [name], MD  Inpt ( No  )   Outpt (Yes)   Obs ( No  )   Diagnosis: N21.1 Urethral Stone  -CPT: [phone]  Surgery: Cystolitholapaxy  Stop Anticoagulations: No  Cardiac/Medical/Pulmonary Clearance needed: Yes, from [facility]  *Orders entered into EPIC  Date: 01/17/25   *Case booked in EPIC  Date: 01/17/25  *Notified pt of Surgery: Date: 01/17/25  PRE-OP  UA & CX: no  *Placed into Prior Authorization Work [place] Date: 01/17/25  Assistant/laser/rep:No                "

## 2025-01-17 NOTE — Telephone Encounter (Signed)
 Per Dr. Twylla, Patient is to be scheduled for Cystolitholapaxy   Mr. Scheidt was contacted and possible surgical dates were discussed, Thursday January 29th, 2026 was agreed upon for surgery.   Patient was directed to call [phone] between 1-3pm the day before surgery to find out surgical arrival time.  Instructions were given not to eat or drink from midnight on the night before surgery and have a driver for the day of surgery. On the surgery day patient was instructed to enter through the [facility] entrance of [facility] report the Same Day Surgery desk.   Pre-Admit Testing will be in contact via phone to set up an interview with the anesthesia team to review your history and medications prior to surgery.   Reminder of this information was sent via MyChart to the patient.

## 2025-01-17 NOTE — Progress Notes (Signed)
" ° °  01/17/2025 8:04 AM   Patrick [name] 02-18-1956 [id number]  Cystoscopy Procedure Note:  Indication:  Hx of GH w/ LUTS  - BPH s/p prior TUMT ~2010  - hx of elevated PSA ~5, s/p x2 prior neg biopsies  After informed consent and discussion of the procedure and its risks, Patrick [name] was positioned and prepped in the standard fashion. Cystoscopy was performed with a flexible cystoscope.  We encountered a 8-9 mm stone sitting within the prostatic fossa.  We were able to navigate the cystoscope proximal to the stone and inspect the bladder.  Complete survey of the bladder was generally unremarkable, I did not note any additional stones.  No bladder lesions or tumors.  He had a very small median intravesical lobe.  Prostate was moderately obstructed with BPH regrowth.   Imaging: Recent CTU  reviewed (01/01/25) -  IMPRESSION: 1. 1 mm nonobstructing stone in the interpolar right kidney. No other urinary stone disease evident. No secondary changes in either kidney or ureter. 2. Prostatomegaly. 3. 16 mm low-density lesion in the upper pole left kidney shows slight increase in attenuation after IV contrast administration. While likely a cyst with pseudo enhancement, low-grade enhancement in a soft tissue lesion is not entirely excluded. Continued CT follow-up to ensure stability or MRI of the abdomen with and without contrast to further characterize recommended . 4. Mild circumferential bladder wall thickening and trabeculation. No focal bladder wall abnormality evident. 5. Cholelithiasis. 6. Bilateral groin hernias. The appendix is contained within the right groin hernia. Left groin hernia contains only fat. 7.  Aortic Atherosclerosis (ICD10-I70.0).  Findings: 8-9 mm stone sitting within the prostatic fossa.   Assessment and Plan: - Will try to add him on tomorrow with Dr. Stoiff for cystoscopy / urethral stone extraction under sedation.  - Otherwise, Recommend CT abd in 1 year for LUP  cyst surveillance  [name], MD 01/17/2025   "

## 2025-01-17 NOTE — Progress Notes (Signed)
 [name] presents for a procedure visit. BP today is 142/71. Greater than 140/90. Provider notified. Follow up with PCP if high PCP continues. Pt voiced understanding.

## 2025-01-18 ENCOUNTER — Telehealth (HOSPITAL_BASED_OUTPATIENT_CLINIC_OR_DEPARTMENT_OTHER): Payer: Self-pay | Admitting: *Deleted

## 2025-01-18 ENCOUNTER — Telehealth: Payer: Self-pay

## 2025-01-18 ENCOUNTER — Ambulatory Visit: Admit: 2025-01-18 | Admitting: Urology

## 2025-01-18 ENCOUNTER — Encounter: Payer: Self-pay | Admitting: Cardiovascular Disease

## 2025-01-18 LAB — MICROSCOPIC EXAMINATION

## 2025-01-18 LAB — URINALYSIS, COMPLETE
Bilirubin, UA: NEGATIVE
Glucose, UA: NEGATIVE
Nitrite, UA: NEGATIVE
Protein,UA: NEGATIVE
RBC, UA: NEGATIVE
Specific Gravity, UA: 1.03 (ref 1.005–1.030)
Urobilinogen, Ur: 0.2 mg/dL (ref 0.2–1.0)
pH, UA: 6 (ref 5.0–7.5)

## 2025-01-18 NOTE — Progress Notes (Signed)
" °  Phone Number: [phone] for Surgical Coordinator Fax Number: [phone]  REQUEST FOR SURGICAL CLEARANCE     Date: 01/18/2025  [facility] to[facility]  Surgeon: Dr. [name], MD     Date of Surgery: 01/26/2025  Operation: Cystolitholapaxy  Anesthesia Type: MAC  Diagnosis: Urethral Stone  Patient Requires:   Cardiac / Vascular Clearance : Yes  Reason: Patient does not need to hold Eliquis  or Plavix , just needs cardiac clearance    Risk Assessment:    Low   []       Moderate   []     High   []           This patient is optimized for surgery  YES []       NO   []    I recommend further assessment/workup prior to surgery. YES []      NO  []   Appointment scheduled for: _______________________   Further recommendations: ____________________________________     Physician Signature:__________________________________   Printed Name: ________________________________________   Date: _________________    "

## 2025-01-18 NOTE — Telephone Encounter (Signed)
 Forwarding to device clinic for recommendations.  Thank you for your help.  Josefa HERO. Tricia Pledger NP-C     01/18/2025, 1:23 PM [facility] [address] [place], [place] [address] Office [phone]

## 2025-01-18 NOTE — Progress Notes (Signed)
 PERIOPERATIVE PRESCRIPTION FOR IMPLANTED CARDIAC DEVICE PROGRAMMING  Patient Information: Name:  Patrick [name]  DOB:  November 12, 1956  MRN:  [id number]  Procedure:  CYSTOLITHOLAPAXY    Date of Surgery:  Clearance 01/26/25                                  Surgeon:  DR. [name] Surgeon's Group or Practice Name:  [facility] Phone number:  [phone] Fax number:  [phone] MELISSA HADLEY, CMA   Type of Clearance Requested:   - Medical ; PER FORM PT DOES NOT NEED TO HOLD ANY BLOOD THINNERS ; JUST NEEDS CARDIAC CLEARANCE   Type of Anesthesia:  MAC Device Information:  Clinic EP Physician:  Dr. Jerel Croitoru  Device Type:  Pacemaker Manufacturer and Phone #:  [employer]: [phone] Pacemaker Dependent?:  No. Date of Last Device Check:  11/15/24  Remote  Normal Device Function?:  Yes.    Electrophysiologist's Recommendations:  Have magnet available. Provide continuous ECG monitoring when magnet is used or reprogramming is to be performed.  Procedure should not interfere with device function.  No device programming or magnet placement needed.  Per Device Clinic Standing Orders, [name], RN  1:44 PM 01/18/2025

## 2025-01-18 NOTE — Telephone Encounter (Signed)
"  ° °  Pre-operative Risk Assessment    Patient Name: Patrick [name]  DOB: Feb 28, 1956 MRN: [id number]   Date of last office visit: 11/01/24 DR. TAYLOR Date of next office visit: 03/05/25 DR. CROITORU-5 MONT F/U   Request for Surgical Clearance    Procedure:  CYSTOLITHOLAPAXY   Date of Surgery:  Clearance 01/26/25                                Surgeon:  DR. [name] Surgeon's Group or Practice Name:  [facility] Phone number:  [phone] Fax number:  [phone] MELISSA HADLEY, CMA   Type of Clearance Requested:   - Medical ; PER FORM PT DOES NOT NEED TO HOLD ANY BLOOD THINNERS ; JUST NEEDS CARDIAC CLEARANCE   Type of Anesthesia:  MAC   Additional requests/questions:    Bonney Niels Jest   01/18/2025, 1:03 PM   "

## 2025-01-18 NOTE — Telephone Encounter (Signed)
 Hey Kenzie,   You recently saw Mr. Lammert in clinic.  He is requesting cystolitholopaxy.  This does not require him to hold any of his antiplatelet therapy.  Would you be able to comment on cardiac risk for upcoming procedure?  Thank you for your help.  Please direct your response to CVD IV preop pool.  Josefa HERO. Vencent Hauschild NP-C     01/18/2025, 1:22 PM [facility] [address] [place], [place] [address] Office [phone]  '

## 2025-01-18 NOTE — Telephone Encounter (Signed)
 Spoke with pt. Pt. Rescheduled surgery to 01/26/2025 with Dr. Georganne. Patient asked if he needed to proceed with MRI that was previously scheduled for 01/30/2025- Spoke with Dr. Georganne who advised to cancel that MRI and we would circle back after upcoming surgery. Patient made aware and I have canceled MRI with Radiology. Patient verbalized understanding.

## 2025-01-19 ENCOUNTER — Telehealth (HOSPITAL_BASED_OUTPATIENT_CLINIC_OR_DEPARTMENT_OTHER): Payer: Self-pay | Admitting: *Deleted

## 2025-01-19 NOTE — Telephone Encounter (Signed)
 S/w the pt and he has been scheduled tele preop appt 01/24/25. Med rec and consent are done.

## 2025-01-19 NOTE — Telephone Encounter (Signed)
 S/w the pt and he has been scheduled tele preop appt 01/24/25. Med rec and consent are done.      Patient Consent for Virtual Visit        Patrick [name] has provided verbal consent on 01/19/2025 for a virtual visit (video or telephone).   CONSENT FOR VIRTUAL VISIT FOR:  Patrick [name]  By participating in this virtual visit I agree to the following:  I hereby voluntarily request, consent and authorize [facility] and its employed or contracted physicians, physician assistants, nurse practitioners or other licensed health care professionals (the Practitioner), to provide me with telemedicine health care services (the Services) as deemed necessary by the treating Practitioner. I acknowledge and consent to receive the Services by the Practitioner via telemedicine. I understand that the telemedicine visit will involve communicating with the Practitioner through live audiovisual communication technology and the disclosure of certain medical information by electronic transmission. I acknowledge that I have been given the opportunity to request an in-person assessment or other available alternative prior to the telemedicine visit and am voluntarily participating in the telemedicine visit.  I understand that I have the right to withhold or withdraw my consent to the use of telemedicine in the course of my care at any time, without affecting my right to future care or treatment, and that the Practitioner or I may terminate the telemedicine visit at any time. I understand that I have the right to inspect all information obtained and/or recorded in the course of the telemedicine visit and may receive copies of available information for a reasonable fee.  I understand that some of the potential risks of receiving the Services via telemedicine include:  Delay or interruption in medical evaluation due to technological equipment failure or disruption; Information transmitted may not be sufficient (e.g.  poor resolution of images) to allow for appropriate medical decision making by the Practitioner; and/or  In rare instances, security protocols could fail, causing a breach of personal health information.  Furthermore, I acknowledge that it is my responsibility to provide information about my medical history, conditions and care that is complete and accurate to the best of my ability. I acknowledge that Practitioner's advice, recommendations, and/or decision may be based on factors not within their control, such as incomplete or inaccurate data provided by me or distortions of diagnostic images or specimens that may result from electronic transmissions. I understand that the practice of medicine is not an exact science and that Practitioner makes no warranties or guarantees regarding treatment outcomes. I acknowledge that a copy of this consent can be made available to me via my patient portal [facility] MyChart), or I can request a printed copy by calling the office of [facility].    I understand that my insurance will be billed for this visit.   I have read or had this consent read to me. I understand the contents of this consent, which adequately explains the benefits and risks of the Services being provided via telemedicine.  I have been provided ample opportunity to ask questions regarding this consent and the Services and have had my questions answered to my satisfaction. I give my informed consent for the services to be provided through the use of telemedicine in my medical care

## 2025-01-22 ENCOUNTER — Inpatient Hospital Stay
Admission: RE | Admit: 2025-01-22 | Discharge: 2025-01-22 | Disposition: A | Source: Ambulatory Visit | Attending: Urology

## 2025-01-22 ENCOUNTER — Other Ambulatory Visit: Payer: Self-pay

## 2025-01-23 ENCOUNTER — Encounter: Payer: Self-pay | Admitting: Urology

## 2025-01-24 ENCOUNTER — Ambulatory Visit

## 2025-01-24 DIAGNOSIS — Z01810 Encounter for preprocedural cardiovascular examination: Secondary | ICD-10-CM

## 2025-01-24 NOTE — Progress Notes (Signed)
 "   Virtual Visit via Telephone Note   Because of [name] co-morbid illnesses, he is at least at moderate risk for complications without adequate follow up.  This format is felt to be most appropriate for this patient at this time.  Due to technical limitations with video connection (technology), today's appointment will be conducted as an audio only telehealth visit, and [name] verbally agreed to proceed in this manner.   All issues noted in this document were discussed and addressed.  No physical exam could be performed with this format.  Evaluation Performed:  Preoperative cardiovascular risk assessment _____________   Date:  01/24/2025   Patient ID:  Patrick [name], DOB 1956-08-23, MRN [id number] Patient Location:  Home Provider location:   Office  Primary Care Provider:  [name] ORN, MD Primary Cardiologist:  [name], MD  Chief Complaint / Patient Profile   69 y.o. y/o male with a h/o CAD, paroxysmal atrial fibrillation on Eliquis , 2018 STEMI treated with PCI of the LAD after D1, he had a stent 09/14/24 complete heart block status post dual-chamber PPM 2023, hypertension, hyperlipidemia, type 2 diabetes mellitus, and rheumatoid arthritis who is pending cystolitholopaxy and presents today for telephonic preoperative cardiovascular risk assessment.  History of Present Illness    Patrick [name] is a 69 y.o. male who presents via audio/video conferencing for a telehealth visit today.  Pt was last seen in cardiology clinic on 10/10/2024 by Miriam Shams, NP.  At that time Patrick [name] was doing well.  The patient is now pending procedure as outlined above. Since his last visit, he has not had any issue with his heart, no Afib issues. Good blood pressure readings. 120/70 or so on repeat.   He is doing about 3,000 steps with the bad weather but he has been bowling (today in fact). Yesterday he walked about a half a mile. He has had a little bit of pressure in his chest  but this resolved without any nitroglycerin  use. He has not taken any nitroglycerin  in over a year. PSA has been elevated for some time and he might need a procedure for his prostate surgery.   Per urology patient may continue antiplatelet therapy and DOAC. Might need to skip his blood thinners in the morning.   Past Medical History    Past Medical History:  Diagnosis Date   Aortic atherosclerosis    CAD in native artery    a.) s/p anterolateral STEMI 08/04/2017: 100% mLAD (3.0 x 38 mm Reolute Onyx DES); b.) LHC/PCI 09/14/2024: 100% OM1 (3.0 x 16 mm Synergy DES)   Complete heart block (HCC)    a.) s/p PPM placement 05/17/2022   GERD (gastroesophageal reflux disease)    History of kidney stones    Hyperlipidemia    Hypertension    Long term methotrexate user    a.) on DMARD therapy (methotrexate) for RA   Long-term use of hydroxychloroquine    a.) on DMARD therapy (hydroxychloroquine) for RA   On apixaban  therapy    On long term clopidogrel  therapy    Overactive bladder    PAF (paroxysmal atrial fibrillation) (HCC)    a.) CHA2DS2VASc = 4 (age, HTN, vascular disease, T2DM) as of 01/23/2025; b.) rate/rhythm maintained intrinsically without pharmacological intervention; chronically anticoagulated with standard dose apixaban    Peripheral neuropathy    Presence of permanent cardiac pacemaker 05/17/2022   a.) Biotronik Edora 8DR-T MRI ([id number])   RBBB (right bundle branch block)  Rheumatoid arthritis (HCC)    ST elevation myocardial infarction (STEMI) of anterolateral wall (HCC) 08/04/2017   a.) PCI 08/04/2017: 100% mLAD --> 3.0 x 28 mm Resolute Onyx DES   Syncope    Uncontrolled type 2 diabetes mellitus with hyperglycemia [facility])    Past Surgical History:  Procedure Laterality Date   CORONARY CTO INTERVENTION N/A 09/14/2024   Procedure: CORONARY CTO INTERVENTION;  Surgeon: Wonda Sharper, MD;  Location: [facility] INVASIVE CV LAB;  Service: Cardiovascular;  Laterality: N/A;    CORONARY/GRAFT ACUTE MI REVASCULARIZATION N/A 08/04/2017   Procedure: Coronary/Graft Acute MI Revascularization;  Surgeon: Burnard Debby LABOR, MD;  Location: [facility] INVASIVE CV LAB;  Service: Cardiovascular;  Laterality: N/A;   LEFT HEART CATH AND CORONARY ANGIOGRAPHY N/A 08/04/2017   Procedure: LEFT HEART CATH AND CORONARY ANGIOGRAPHY;  Surgeon: Burnard Debby LABOR, MD;  Location: [facility] INVASIVE CV LAB;  Service: Cardiovascular;  Laterality: N/A;   LEFT HEART CATH AND CORONARY ANGIOGRAPHY N/A 03/21/2018   Procedure: LEFT HEART CATH AND CORONARY ANGIOGRAPHY;  Surgeon: Burnard Debby LABOR, MD;  Location: [facility] INVASIVE CV LAB;  Service: Cardiovascular;  Laterality: N/A;   LEFT HEART CATH AND CORONARY ANGIOGRAPHY N/A 09/14/2024   Procedure: LEFT HEART CATH AND CORONARY ANGIOGRAPHY;  Surgeon: Wonda Sharper, MD;  Location[facility] INVASIVE CV LAB;  Service: Cardiovascular;  Laterality: N/A;   PACEMAKER IMPLANT N/A 05/17/2022   Procedure: PACEMAKER IMPLANT;  Surgeon: [name] Danelle ORN, MD;  Location: [facility] INVASIVE CV LAB;  Service: Cardiovascular;  Laterality: N/A;   PROSTATE SURGERY     in office Dr Kassie   TEMPORARY PACEMAKER N/A 07/14/2021   Procedure: TEMPORARY PACEMAKER;  Surgeon: Anner Alm ORN, MD;  Location: [facility] INVASIVE CV LAB;  Service: Cardiovascular;  Laterality: N/A;   TONSILLECTOMY      Allergies  Allergies[1]  Home Medications    Prior to Admission medications  Medication Sig Start Date End Date Taking? Authorizing Provider  Acetylcarnitine HCl (ACETYL L-CARNITINE PO) Take 1,500 mg by mouth daily.    [provider]  ALPHA LIPOIC ACID PO Take 600 mg by mouth daily.    [provider]  amLODipine  (NORVASC ) 5 MG tablet Take 5 mg by mouth daily.    [provider]  apixaban  (ELIQUIS ) 5 MG TABS tablet TAKE 1 TABLET(5 MG) BY MOUTH TWICE DAILY 10/06/24   Croitoru, Mihai, MD  clopidogrel  (PLAVIX ) 75 MG tablet Take 1 tablet (75 mg total) by mouth daily. 10/10/24   Campbell, Kenzie E, NP   Coenzyme Q10 200 MG capsule Take 200 mg by mouth daily.    [provider]  cyanocobalamin (VITAMIN B12) 1000 MCG tablet Take 1,000 mcg by mouth daily. sublingual    [provider]  folic acid (FOLVITE) 1 MG tablet Take 1 mg by mouth in the morning. 05/27/23   [provider]  Glucosamine HCl 1500 MG TABS Take 1,500 mg by mouth daily.    [provider]  hydroxychloroquine (PLAQUENIL) 200 MG tablet Take 200 mg by mouth 2 (two) times daily. 05/24/23   [provider]  isosorbide  mononitrate (IMDUR ) 60 MG 24 hr tablet Take 1 tablet (60 mg total) by mouth daily. 04/11/24   Burnard Debby LABOR, MD  losartan -hydrochlorothiazide (HYZAAR) 100-12.5 MG tablet Take 1 tablet by mouth in the morning. 09/06/23   [provider]  metFORMIN (GLUCOPHAGE-XR) 500 MG 24 hr tablet Take 1,000 mg by mouth 2 (two) times daily with a meal. Patient taking differently: Take 500 mg by mouth 2 (two)  times daily with a meal. 02/25/21   [provider]  methotrexate (RHEUMATREX) 2.5 MG tablet Take 2.5 mg by mouth as directed. Take 1 tablet for 5 days out of 7, weekly Patient taking differently: Take 15 mg by mouth every Sunday. Sunday morning 05/27/23   [provider]  Multiple Vitamins-Minerals (ABC PLUS SENIOR PO) Take 2 tablets by mouth 2 (two) times daily. Whole food    [provider]  nitroGLYCERIN  (NITROSTAT ) 0.4 MG SL tablet DISSOLVE ONE TABLET UNDER TONGUE AS NEEDED FOR CHEST PAIN EVERY 5 MINUTES FOR 3 DOSES 06/13/24   Burnard Debby LABOR, MD  nystatin cream (MYCOSTATIN) Apply 1 application  topically daily as needed for dry skin. Mix with triamcinolone 11/12/21   [provider]  Omega-3 Fatty Acids (FISH OIL OMEGA-3 PO) Take 2 capsules by mouth 2 (two) times daily. 850 mg EPA 576 mg DHA    [provider]  pantoprazole  (PROTONIX ) 40 MG tablet Take 40 mg by mouth in the morning.    [provider]  Potassium 99 MG TABS Take 99  mg by mouth daily.    [provider]  rosuvastatin  (CRESTOR ) 5 MG tablet Take 5 mg by mouth at bedtime.    [provider]  tamsulosin  (FLOMAX ) 0.4 MG CAPS capsule Take 1 capsule (0.4 mg total) by mouth 2 (two) times daily. 12/12/24   Helon Kirsch A, PA-C  tamsulosin  (FLOMAX ) 0.4 MG CAPS capsule Take 2 capsules (0.8 mg total) by mouth daily. Patient taking differently: Take 0.4 mg by mouth 2 (two) times daily. 12/26/24   Helon Kirsch A, PA-C  triamcinolone cream (KENALOG) 0.1 % Apply 1 application  topically daily as needed (rash). Mix with Nystatin 11/12/21   [provider]  TRULICITY 1.5 MG/0.5ML SOAJ Inject 1.5 mg into the skin every Monday. 07/03/24   [provider]    Physical Exam    Vital Signs:  Patrick [name] does not have vital signs available for review today.  Given telephonic nature of communication, physical exam is limited. AAOx3. NAD. Normal affect.  Speech and respirations are unlabored.  Accessory Clinical Findings    None  Assessment & Plan    1.  Preoperative Cardiovascular Risk Assessment:  Mr. Eshelman perioperative risk of a major cardiac event is 0.9% according to the Revised Cardiac Risk Index (RCRI).  Therefore, he is at low risk for perioperative complications.   His functional capacity is good at 5.81 METs according to the Duke Activity Status Index (DASI). Recommendations: According to ACC/AHA guidelines, no further cardiovascular testing needed.  The patient may proceed to surgery at acceptable risk.   Antiplatelet and/or Anticoagulation Recommendations: He can remains on his plavix  and Eliquis  for this procedure.    The patient was advised that if he develops new symptoms prior to surgery to contact our office to arrange for a follow-up visit, and he verbalized understanding.   A copy of this note will be routed to requesting surgeon.  Time:   Today, I have spent 10 minutes with the patient with telehealth  technology discussing medical history, symptoms, and management plan.     Orren LOISE Fabry, PA-C  01/24/2025, 12:56 PM      [1] No Known Allergies  "

## 2025-01-25 NOTE — Progress Notes (Signed)
 Thanks.  The risk would be quite low even if skipping 2 days of Eliquis  or a few days of Plavix .  It has been more than 3 months since his stent and his embolic risk is only moderate.

## 2025-01-26 ENCOUNTER — Ambulatory Visit
Admission: RE | Admit: 2025-01-26 | Discharge: 2025-01-26 | Disposition: A | Discharge status: Home / Self Care | Source: Home / Self Care | Attending: Urology | Admitting: Urology

## 2025-01-26 ENCOUNTER — Encounter: Payer: Self-pay | Admitting: Urgent Care

## 2025-01-26 ENCOUNTER — Encounter: Payer: Self-pay | Admitting: Urology

## 2025-01-26 ENCOUNTER — Other Ambulatory Visit: Payer: Self-pay

## 2025-01-26 ENCOUNTER — Encounter
Admission: RE | Disposition: A | Discharge status: Home / Self Care | Payer: Self-pay | Source: Home / Self Care | Attending: Urology

## 2025-01-26 DIAGNOSIS — E118 Type 2 diabetes mellitus with unspecified complications: Secondary | ICD-10-CM

## 2025-01-26 DIAGNOSIS — N211 Calculus in urethra: Secondary | ICD-10-CM

## 2025-01-26 HISTORY — DX: Atrioventricular block, complete: I44.2

## 2025-01-26 HISTORY — DX: Other long term (current) drug therapy: Z79.899

## 2025-01-26 HISTORY — DX: Type 2 diabetes mellitus with hyperglycemia: E11.65

## 2025-01-26 HISTORY — DX: Paroxysmal atrial fibrillation: I48.0

## 2025-01-26 HISTORY — DX: Unspecified right bundle-branch block: I45.10

## 2025-01-26 HISTORY — DX: Long term (current) use of anticoagulants: Z79.01

## 2025-01-26 HISTORY — DX: Long term (current) use of antimetabolite agent: Z79.631

## 2025-01-26 HISTORY — DX: Atherosclerosis of aorta: I70.0

## 2025-01-26 HISTORY — DX: Polyneuropathy, unspecified: G62.9

## 2025-01-26 LAB — GLUCOSE, CAPILLARY
Glucose-Capillary: 140 mg/dL — ABNORMAL HIGH (ref 70–99)
Glucose-Capillary: 143 mg/dL — ABNORMAL HIGH (ref 70–99)

## 2025-01-26 MED ORDER — OXYCODONE HCL 5 MG/5ML PO SOLN: 5.0000 mg | Freq: Once | ORAL | Status: AC | PRN

## 2025-01-26 MED ORDER — OXYCODONE HCL 5 MG PO TABS
5.0000 mg | ORAL_TABLET | Freq: Once | ORAL | Status: AC | PRN
  Administered 2025-01-26: 5 mg via ORAL

## 2025-01-26 MED ORDER — PROPOFOL 10 MG/ML IV BOLUS
INTRAVENOUS | Status: AC
  Filled 2025-01-26: qty 20

## 2025-01-26 MED ORDER — GLYCOPYRROLATE 0.2 MG/ML IJ SOLN
INTRAMUSCULAR | Status: DC | PRN
  Administered 2025-01-26: .2 mg via INTRAVENOUS

## 2025-01-26 MED ORDER — FENTANYL CITRATE (PF) 100 MCG/2ML IJ SOLN
INTRAMUSCULAR | Status: DC | PRN
  Administered 2025-01-26 (×2): 25 ug via INTRAVENOUS

## 2025-01-26 MED ORDER — CHLORHEXIDINE GLUCONATE 0.12 % MT SOLN: 15.0000 mL | Freq: Once | OROMUCOSAL | Status: DC

## 2025-01-26 MED ORDER — STERILE WATER FOR IRRIGATION IR SOLN
Status: DC | PRN
  Administered 2025-01-26: 500 mL

## 2025-01-26 MED ORDER — DEXMEDETOMIDINE HCL IN NACL 200 MCG/50ML IV SOLN
INTRAVENOUS | Status: DC | PRN
  Administered 2025-01-26: 12 ug via INTRAVENOUS

## 2025-01-26 MED ORDER — OXYCODONE HCL 5 MG PO TABS
ORAL_TABLET | ORAL | Status: AC
  Filled 2025-01-26: qty 1

## 2025-01-26 MED ORDER — CEFAZOLIN SODIUM-DEXTROSE 2-4 GM/100ML-% IV SOLN
2.0000 g | INTRAVENOUS | Status: AC
  Administered 2025-01-26: 2 g via INTRAVENOUS

## 2025-01-26 MED ORDER — ACETAMINOPHEN 10 MG/ML IV SOLN
INTRAVENOUS | Status: DC | PRN
  Administered 2025-01-26: 1000 mg via INTRAVENOUS

## 2025-01-26 MED ORDER — SODIUM CHLORIDE 0.9 % IR SOLN
Status: DC | PRN
  Administered 2025-01-26: 3000 mL

## 2025-01-26 MED ORDER — ONDANSETRON HCL 4 MG/2ML IJ SOLN
INTRAMUSCULAR | Status: DC | PRN
  Administered 2025-01-26 (×2): 4 mg via INTRAVENOUS

## 2025-01-26 MED ORDER — ORAL CARE MOUTH RINSE: 15.0000 mL | Freq: Once | OROMUCOSAL | Status: DC

## 2025-01-26 MED ORDER — ONDANSETRON HCL 4 MG/2ML IJ SOLN: 4.0000 mg | Freq: Once | INTRAMUSCULAR | Status: DC | PRN

## 2025-01-26 MED ORDER — ACETAMINOPHEN 10 MG/ML IV SOLN: 1000.0000 mg | Freq: Once | INTRAVENOUS | Status: DC | PRN

## 2025-01-26 MED ORDER — ORAL CARE MOUTH RINSE: 15.0000 mL | Freq: Once | OROMUCOSAL | Status: AC

## 2025-01-26 MED ORDER — ACETAMINOPHEN 10 MG/ML IV SOLN
INTRAVENOUS | Status: AC
  Filled 2025-01-26: qty 100

## 2025-01-26 MED ORDER — FENTANYL CITRATE (PF) 100 MCG/2ML IJ SOLN: 25.0000 ug | INTRAMUSCULAR | Status: DC | PRN

## 2025-01-26 MED ORDER — PROPOFOL 10 MG/ML IV BOLUS
INTRAVENOUS | Status: DC | PRN
  Administered 2025-01-26: 200 mg via INTRAVENOUS

## 2025-01-26 MED ORDER — DEXAMETHASONE SOD PHOSPHATE PF 10 MG/ML IJ SOLN
INTRAMUSCULAR | Status: DC | PRN
  Administered 2025-01-26: 5 mg via INTRAVENOUS

## 2025-01-26 MED ORDER — PHENAZOPYRIDINE HCL 200 MG PO TABS: 200.0000 mg | ORAL_TABLET | Freq: Three times a day (TID) | ORAL | 0 refills | Status: AC | PRN

## 2025-01-26 MED ORDER — CEFAZOLIN SODIUM-DEXTROSE 2-4 GM/100ML-% IV SOLN
INTRAVENOUS | Status: AC
  Filled 2025-01-26: qty 100

## 2025-01-26 MED ORDER — LACTATED RINGERS IV SOLN: INTRAVENOUS | Status: DC

## 2025-01-26 MED ORDER — CHLORHEXIDINE GLUCONATE 0.12 % MT SOLN
15.0000 mL | Freq: Once | OROMUCOSAL | Status: AC
  Administered 2025-01-26: 15 mL via OROMUCOSAL

## 2025-01-26 MED ORDER — LIDOCAINE HCL (CARDIAC) PF 100 MG/5ML IV SOSY
PREFILLED_SYRINGE | INTRAVENOUS | Status: DC | PRN
  Administered 2025-01-26: 100 mg via INTRAVENOUS

## 2025-01-26 MED ORDER — CHLORHEXIDINE GLUCONATE 0.12 % MT SOLN
OROMUCOSAL | Status: AC
  Filled 2025-01-26: qty 15

## 2025-01-26 MED ORDER — FENTANYL CITRATE (PF) 100 MCG/2ML IJ SOLN
INTRAMUSCULAR | Status: AC
  Filled 2025-01-26: qty 2

## 2025-01-26 MED ORDER — LIDOCAINE HCL (PF) 2 % IJ SOLN
INTRAMUSCULAR | Status: AC
  Filled 2025-01-26: qty 5

## 2025-01-26 MED ORDER — PHENYLEPHRINE HCL (PRESSORS) 10 MG/ML IV SOLN
INTRAVENOUS | Status: DC | PRN
  Administered 2025-01-26 (×2): 160 ug via INTRAVENOUS

## 2025-01-26 NOTE — Interval H&P Note (Signed)
 History and Physical Interval Note:  01/26/2025 12:13 PM  Patrick [name]  has presented today for surgery, with the diagnosis of Urethral Stone.  The various methods of treatment have been discussed with the patient and family. After consideration of risks, benefits and other options for treatment, the patient has consented to  Procedures: CYSTOSCOPY, WITH BLADDER CALCULUS LITHOLAPAXY (N/A) as a surgical intervention.  The patient's history has been reviewed, patient examined, no change in status, stable for surgery.  I have reviewed the patient's chart and labs.  Questions were answered to the patient's satisfaction.    General: no acute distress, alert/oriented, conversational  HEENT: equal nondilated pupils CV: regular rate Lung: unlabored breathing, regular rate and rhythm  Abd: nondistended, nontender with palpation, no palpable masses  MSK: moving all extremities without issue, normal observed motor function  Plan for OR today: cystolithalopaxy, extraction of obstructive urethral stone.  Counseled higher risk of post-op hematuria Consent signed   Patrick [name]

## 2025-01-26 NOTE — Op Note (Signed)
 Date of procedure: 01/26/25  Preoperative diagnosis:  Urethral stone   Postoperative diagnosis:  Urethral stone   Procedure: Urethral stone  Surgeon: [name], MD  Anesthesia: General  Complications: None  Intraoperative findings:  1 Large ~15 mm urethral stone at the verumontanum-- successfuly treated with laser lithotriopsy and basket extraction 2. 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 Mild diffuse bladder trabeculations, no other bladder stones noted  EBL: <5 ml  Specimens:  Urethral stone  Drains: None  Indication: [name] is a 69 y.o. patient with: 8-9 mm stone sitting within the prostatic fossa.   After reviewing the management options for treatment, they elected to proceed with the above surgical procedure(s). We have discussed the potential benefits and risks of the procedure, side effects of the proposed treatment, the likelihood of the patient achieving the goals of the procedure, and any potential problems that might occur during the procedure or recuperation. Informed consent has been obtained.  Description of procedure:  The patient was taken to the operating room and general anesthesia was induced. SCDs were placed for DVT prophylaxis. The patient was placed in the dorsal lithotomy position, prepped and draped in the usual sterile fashion, and preoperative antibiotics were administered. A preoperative time-out was performed.   We began with rigid cystoscopy.  We immediately encountered a large urethral stone at the verumontanum.  We inserted a 200 nm laser fiber and performed fragmentation laser lithotripsy.  A ZeroTip basket was used to extract all sizable pieces.  The stone fragments were sent for analysis.  We then completed cystoscopy and surveyed the entirety of the bladder.  There were no additional bladder stones.  Bladder mucosa was generally normal without lesions or masses.  There was mild diffuse trabeculations noted in the bladder.   Bilateral ureteral orifices located in orthotopic position.  At this point all instruments were removed and this concluded the procedure.  Patient tolerated well, was extubated and transferred to the PACU in excellent condition.  Disposition: Stable to PACU   Plan: - Follow up clinic in ~3-4 weeks with Dr. Francisca to discuss HoLEP.  He would like to proceed.  He will need full cardiac clearance and a plan for his Eliquis  Plavix .  [name], MD

## 2025-01-26 NOTE — Transfer of Care (Signed)
 Immediate Anesthesia Transfer of Care Note  Patient: Patrick [name]  Procedure(s) Performed: CYSTOSCOPY, WITH BLADDER CALCULUS LITHOLAPAXY  Patient Location: PACU  Anesthesia Type:General  Level of Consciousness: awake, drowsy, and patient cooperative  Airway & Oxygen Therapy: Patient Spontanous Breathing  Post-op Assessment: Report given to RN and Post -op Vital signs reviewed and stable  Post vital signs: Reviewed and stable  Last Vitals:  Vitals Value Taken Time  BP 108/67 01/26/25 12:54  Temp    Pulse 73 01/26/25 12:58  Resp 16 01/26/25 12:58  SpO2 96 % 01/26/25 12:58  Vitals shown include unfiled device data.  Last Pain:  Vitals:   01/26/25 1042  TempSrc: Temporal  PainSc: 0-No pain         Complications: No notable events documented.

## 2025-01-26 NOTE — Anesthesia Postprocedure Evaluation (Signed)
"   Anesthesia Post Note  Patient: Patrick [name]  Procedure(s) Performed: CYSTOSCOPY, WITH BLADDER CALCULUS LITHOLAPAXY  Patient location during evaluation: PACU Anesthesia Type: General Level of consciousness: awake and alert Pain management: pain level controlled Vital Signs Assessment: post-procedure vital signs reviewed and stable Respiratory status: spontaneous breathing, nonlabored ventilation, respiratory function stable and patient connected to nasal cannula oxygen Cardiovascular status: blood pressure returned to baseline and stable Postop Assessment: no apparent nausea or vomiting Anesthetic complications: no   No notable events documented.   Last Vitals:  Vitals:   01/26/25 1042 01/26/25 1255  BP: 139/83 108/67  Pulse: 78 78  Resp: 16 17  Temp: 36.6 C (!) 36.3 C  SpO2: 98% 100%    Last Pain:  Vitals:   01/26/25 1255  TempSrc:   PainSc: 3                  Tyreek Clabo A Kerby Hockley      "

## 2025-01-26 NOTE — Anesthesia Preprocedure Evaluation (Signed)
"                                    Anesthesia Evaluation  Patient identified by MRN, date of birth, ID band Patient awake    Reviewed: Allergy & Precautions, H&P , NPO status , Patient's Chart, lab work & pertinent test results  Airway Mallampati: II  TM Distance: >3 FB Neck ROM: Full    Dental no notable dental hx.    Pulmonary neg pulmonary ROS, former smoker   Pulmonary exam normal breath sounds clear to auscultation       Cardiovascular hypertension, negative cardio ROS Normal cardiovascular exam Rhythm:Regular Rate:Normal     Neuro/Psych negative neurological ROS  negative psych ROS   GI/Hepatic negative GI ROS, Neg liver ROS,,,  Endo/Other  negative endocrine ROSdiabetes    Renal/GU negative Renal ROS  negative genitourinary   Musculoskeletal negative musculoskeletal ROS (+)    Abdominal   Peds negative pediatric ROS (+)  Hematology negative hematology ROS (+)   Anesthesia Other Findings   Reproductive/Obstetrics negative OB ROS                              Anesthesia Physical Anesthesia Plan  ASA: 3  Anesthesia Plan: General   Post-op Pain Management:    Induction: Intravenous  PONV Risk Score and Plan: 0  Airway Management Planned: LMA  Additional Equipment:   Intra-op Plan:   Post-operative Plan: Extubation in OR  Informed Consent: I have reviewed the patients History and Physical, chart, labs and discussed the procedure including the risks, benefits and alternatives for the proposed anesthesia with the patient or authorized representative who has indicated his/her understanding and acceptance.     Dental advisory given  Plan Discussed with: CRNA  Anesthesia Plan Comments:         Anesthesia Quick Evaluation  "

## 2025-01-26 NOTE — Anesthesia Procedure Notes (Signed)
 Procedure Name: LMA Insertion Date/Time: 01/26/2025 12:21 PM  Performed by: Ledora Duncan, CRNAPre-anesthesia Checklist: Patient identified, Emergency Drugs available, Suction available and Patient being monitored Patient Re-evaluated:Patient Re-evaluated prior to induction Oxygen Delivery Method: Circle system utilized Preoxygenation: Pre-oxygenation with 100% oxygen Induction Type: IV induction Ventilation: Mask ventilation without difficulty LMA: LMA inserted LMA Size: 4.0 Placement Confirmation: positive ETCO2 and breath sounds checked- equal and bilateral Tube secured with: Tape Dental Injury: Teeth and Oropharynx as per pre-operative assessment

## 2025-01-26 NOTE — Discharge Instructions (Signed)
 T  Diet:  You may return to your normal diet immediately. Because of the raw surface of your bladder, alcohol, spicy foods, foods high in acid and drinks with caffeine may cause irritation or frequency and should be used in moderation. To keep your urine flowing freely and avoid constipation, drink plenty of fluids during the day (8-10 glasses). Tip: Avoid cranberry juice because it is very acidic.  Activity:  Your physical activity doesn't need to be restricted. However, if you are very active, you may see some blood in the urine. We suggest that you reduce your activity under the circumstances until the bleeding has stopped.  Bowels:  It is important to keep your bowels regular during the postoperative period. Straining with bowel movements can cause bleeding. A bowel movement every other day is reasonable. Use a mild laxative if needed, such as milk of magnesia 2-3 tablespoons, or 2 Dulcolax tablets. Call if you continue to have problems. If you had been taking narcotics for pain, before, during or after your surgery, you may be constipated. Take a laxative if necessary.   Medication:  You should resume your pre-surgery medications unless told not to. In addition you may be given an antibiotic to prevent or treat infection. Antibiotics are not always necessary. All medication should be taken as prescribed until the bottles are finished unless you are having an unusual reaction to one of the drugs.   [facility] [address] [place], [place] [address] [phone]

## 2025-01-30 ENCOUNTER — Ambulatory Visit (HOSPITAL_COMMUNITY)

## 2025-02-14 ENCOUNTER — Ambulatory Visit

## 2025-03-05 ENCOUNTER — Ambulatory Visit: Admitting: Cardiovascular Disease

## 2025-05-16 ENCOUNTER — Ambulatory Visit

## 2025-08-15 ENCOUNTER — Ambulatory Visit

## 2025-11-14 ENCOUNTER — Ambulatory Visit
